# Patient Record
Sex: Male | Born: 1950 | Race: White | Hispanic: No | Smoking: Never smoker
Health system: Southern US, Community
[De-identification: ages and names within clinical notes are randomized; demographics above are authoritative.]

## PROBLEM LIST (undated history)

## (undated) DIAGNOSIS — E785 Hyperlipidemia, unspecified: Secondary | ICD-10-CM

## (undated) DIAGNOSIS — M79605 Pain in left leg: Secondary | ICD-10-CM

## (undated) DIAGNOSIS — G8918 Other acute postprocedural pain: Secondary | ICD-10-CM

## (undated) DIAGNOSIS — I10 Essential (primary) hypertension: Secondary | ICD-10-CM

## (undated) DIAGNOSIS — M549 Dorsalgia, unspecified: Secondary | ICD-10-CM

## (undated) DIAGNOSIS — E119 Type 2 diabetes mellitus without complications: Secondary | ICD-10-CM

## (undated) DIAGNOSIS — M79604 Pain in right leg: Secondary | ICD-10-CM

## (undated) DIAGNOSIS — G8929 Other chronic pain: Secondary | ICD-10-CM

## (undated) DIAGNOSIS — D869 Sarcoidosis, unspecified: Secondary | ICD-10-CM

## (undated) HISTORY — DX: Hyperlipidemia, unspecified: E78.5

## (undated) HISTORY — PX: APPENDECTOMY: SHX54

## (undated) HISTORY — DX: Other acute postprocedural pain: G89.18

## (undated) HISTORY — DX: Pain in left leg: M79.605

## (undated) HISTORY — DX: Other chronic pain: G89.29

## (undated) HISTORY — DX: Pain in right leg: M79.604

## (undated) HISTORY — DX: Dorsalgia, unspecified: M54.9

## (undated) HISTORY — DX: Essential (primary) hypertension: I10

## (undated) HISTORY — PX: OTHER SURGICAL HISTORY: SHX169

## (undated) HISTORY — PX: TONSILLECTOMY: SUR1361

## (undated) HISTORY — DX: Type 2 diabetes mellitus without complications: E11.9

## (undated) HISTORY — DX: Sarcoidosis, unspecified: D86.9

---

## 2010-03-28 DIAGNOSIS — I8500 Esophageal varices without bleeding: Secondary | ICD-10-CM | POA: Insufficient documentation

## 2011-05-30 DIAGNOSIS — G4701 Insomnia due to medical condition: Secondary | ICD-10-CM | POA: Insufficient documentation

## 2011-08-03 DIAGNOSIS — R252 Cramp and spasm: Secondary | ICD-10-CM | POA: Insufficient documentation

## 2011-09-22 DIAGNOSIS — G510 Bell's palsy: Secondary | ICD-10-CM | POA: Insufficient documentation

## 2012-09-12 DIAGNOSIS — M12519 Traumatic arthropathy, unspecified shoulder: Secondary | ICD-10-CM | POA: Insufficient documentation

## 2013-02-19 DIAGNOSIS — R200 Anesthesia of skin: Secondary | ICD-10-CM | POA: Insufficient documentation

## 2013-09-12 DIAGNOSIS — I8390 Asymptomatic varicose veins of unspecified lower extremity: Secondary | ICD-10-CM | POA: Insufficient documentation

## 2013-09-18 DIAGNOSIS — D860 Sarcoidosis of lung: Secondary | ICD-10-CM | POA: Insufficient documentation

## 2013-09-18 DIAGNOSIS — R600 Localized edema: Secondary | ICD-10-CM | POA: Insufficient documentation

## 2014-01-02 DIAGNOSIS — M79605 Pain in left leg: Secondary | ICD-10-CM

## 2014-01-02 HISTORY — DX: Pain in left leg: M79.605

## 2014-01-30 DIAGNOSIS — M7060 Trochanteric bursitis, unspecified hip: Secondary | ICD-10-CM | POA: Insufficient documentation

## 2014-02-14 DIAGNOSIS — R278 Other lack of coordination: Secondary | ICD-10-CM | POA: Insufficient documentation

## 2014-05-02 DIAGNOSIS — F411 Generalized anxiety disorder: Secondary | ICD-10-CM | POA: Insufficient documentation

## 2014-08-26 DIAGNOSIS — E1165 Type 2 diabetes mellitus with hyperglycemia: Secondary | ICD-10-CM | POA: Insufficient documentation

## 2014-12-15 DIAGNOSIS — Z7952 Long term (current) use of systemic steroids: Secondary | ICD-10-CM | POA: Insufficient documentation

## 2015-02-18 DIAGNOSIS — L309 Dermatitis, unspecified: Secondary | ICD-10-CM | POA: Insufficient documentation

## 2015-03-30 DIAGNOSIS — Z79899 Other long term (current) drug therapy: Secondary | ICD-10-CM | POA: Insufficient documentation

## 2015-05-19 DIAGNOSIS — K5900 Constipation, unspecified: Secondary | ICD-10-CM | POA: Insufficient documentation

## 2015-07-15 ENCOUNTER — Encounter: Payer: Self-pay | Admitting: Pain Medicine

## 2015-07-15 ENCOUNTER — Ambulatory Visit: Payer: Medicare Other | Attending: Pain Medicine | Admitting: Pain Medicine

## 2015-07-15 VITALS — BP 137/88 | HR 73 | Temp 97.8°F | Resp 18 | Ht 67.0 in | Wt 180.0 lb

## 2015-07-15 DIAGNOSIS — E663 Overweight: Secondary | ICD-10-CM | POA: Diagnosis not present

## 2015-07-15 DIAGNOSIS — G8929 Other chronic pain: Secondary | ICD-10-CM | POA: Insufficient documentation

## 2015-07-15 DIAGNOSIS — M5136 Other intervertebral disc degeneration, lumbar region: Secondary | ICD-10-CM | POA: Diagnosis not present

## 2015-07-15 DIAGNOSIS — E559 Vitamin D deficiency, unspecified: Secondary | ICD-10-CM | POA: Insufficient documentation

## 2015-07-15 DIAGNOSIS — M549 Dorsalgia, unspecified: Secondary | ICD-10-CM | POA: Diagnosis present

## 2015-07-15 DIAGNOSIS — M545 Low back pain: Secondary | ICD-10-CM | POA: Diagnosis not present

## 2015-07-15 DIAGNOSIS — M25562 Pain in left knee: Secondary | ICD-10-CM

## 2015-07-15 DIAGNOSIS — G894 Chronic pain syndrome: Secondary | ICD-10-CM

## 2015-07-15 DIAGNOSIS — M25561 Pain in right knee: Secondary | ICD-10-CM

## 2015-07-15 DIAGNOSIS — M5441 Lumbago with sciatica, right side: Secondary | ICD-10-CM

## 2015-07-15 DIAGNOSIS — M47816 Spondylosis without myelopathy or radiculopathy, lumbar region: Secondary | ICD-10-CM

## 2015-07-15 DIAGNOSIS — F1120 Opioid dependence, uncomplicated: Secondary | ICD-10-CM

## 2015-07-15 DIAGNOSIS — M4726 Other spondylosis with radiculopathy, lumbar region: Secondary | ICD-10-CM | POA: Diagnosis not present

## 2015-07-15 DIAGNOSIS — Z79899 Other long term (current) drug therapy: Secondary | ICD-10-CM

## 2015-07-15 DIAGNOSIS — M79604 Pain in right leg: Secondary | ICD-10-CM

## 2015-07-15 DIAGNOSIS — F1190 Opioid use, unspecified, uncomplicated: Secondary | ICD-10-CM | POA: Insufficient documentation

## 2015-07-15 DIAGNOSIS — Z79891 Long term (current) use of opiate analgesic: Secondary | ICD-10-CM | POA: Insufficient documentation

## 2015-07-15 DIAGNOSIS — M5416 Radiculopathy, lumbar region: Secondary | ICD-10-CM | POA: Insufficient documentation

## 2015-07-15 HISTORY — DX: Pain in right leg: M79.604

## 2015-07-15 MED ORDER — OXYCODONE-ACETAMINOPHEN 10-325 MG PO TABS
1.0000 | ORAL_TABLET | Freq: Two times a day (BID) | ORAL | Status: DC | PRN
Start: 2015-07-15 — End: 2015-10-07

## 2015-07-15 MED ORDER — MORPHINE SULFATE ER 30 MG PO TBCR: 30.0000 mg | EXTENDED_RELEASE_TABLET | Freq: Two times a day (BID) | ORAL | Status: DC

## 2015-07-15 MED ORDER — OXYCODONE-ACETAMINOPHEN 10-325 MG PO TABS: 1.0000 | ORAL_TABLET | Freq: Two times a day (BID) | ORAL | Status: DC | PRN

## 2015-07-15 MED ORDER — GABAPENTIN 300 MG PO CAPS: 600.0000 mg | ORAL_CAPSULE | Freq: Three times a day (TID) | ORAL | Status: DC

## 2015-07-15 MED ORDER — OXYCODONE-ACETAMINOPHEN 10-325 MG PO TABS
1.0000 | ORAL_TABLET | Freq: Two times a day (BID) | ORAL | Status: DC | PRN
Start: 2015-07-15 — End: 2015-11-19

## 2015-07-15 NOTE — Progress Notes (Signed)
Safety precautions to be maintained throughout the outpatient stay will include: orient to surroundings, keep bed in low position, maintain call bell within reach at all times, provide assistance with transfer out of bed and ambulation. 6 morphine 30 mg pills remain  7/60 Percocet remain

## 2015-07-15 NOTE — Patient Instructions (Signed)
GENERAL RISKS AND COMPLICATIONS  What are the risk, side effects and possible complications? Generally speaking, most procedures are safe.  However, with any procedure there are risks, side effects, and the possibility of complications.  The risks and complications are dependent upon the sites that are lesioned, or the type of nerve block to be performed.  The closer the procedure is to the spine, the more serious the risks are.  Great care is taken when placing the radio frequency needles, block needles or lesioning probes, but sometimes complications can occur. 1. Infection: Any time there is an injection through the skin, there is a risk of infection.  This is why sterile conditions are used for these blocks.  There are four possible types of infection. 1. Localized skin infection. 2. Central Nervous System Infection-This can be in the form of Meningitis, which can be deadly. 3. Epidural Infections-This can be in the form of an epidural abscess, which can cause pressure inside of the spine, causing compression of the spinal cord with subsequent paralysis. This would require an emergency surgery to decompress, and there are no guarantees that the patient would recover from the paralysis. 4. Discitis-This is an infection of the intervertebral discs.  It occurs in about 1% of discography procedures.  It is difficult to treat and it may lead to surgery.        2. Pain: the needles have to go through skin and soft tissues, will cause soreness.       3. Damage to internal structures:  The nerves to be lesioned may be near blood vessels or    other nerves which can be potentially damaged.       4. Bleeding: Bleeding is more common if the patient is taking blood thinners such as  aspirin, Coumadin, Ticiid, Plavix, etc., or if he/she have some genetic predisposition  such as hemophilia. Bleeding into the spinal canal can cause compression of the spinal  cord with subsequent paralysis.  This would require an  emergency surgery to  decompress and there are no guarantees that the patient would recover from the  paralysis.       5. Pneumothorax:  Puncturing of a lung is a possibility, every time a needle is introduced in  the area of the chest or upper back.  Pneumothorax refers to free air around the  collapsed lung(s), inside of the thoracic cavity (chest cavity).  Another two possible  complications related to a similar event would include: Hemothorax and Chylothorax.   These are variations of the Pneumothorax, where instead of air around the collapsed  lung(s), you may have blood or chyle, respectively.       6. Spinal headaches: They may occur with any procedures in the area of the spine.       7. Persistent CSF (Cerebro-Spinal Fluid) leakage: This is a rare problem, but may occur  with prolonged intrathecal or epidural catheters either due to the formation of a fistulous  track or a dural tear.       8. Nerve damage: By working so close to the spinal cord, there is always a possibility of  nerve damage, which could be as serious as a permanent spinal cord injury with  paralysis.       9. Death:  Although rare, severe deadly allergic reactions known as "Anaphylactic  reaction" can occur to any of the medications used.      10. Worsening of the symptoms:  We can always make thing worse.    What are the chances of something like this happening? Chances of any of this occuring are extremely low.  By statistics, you have more of a chance of getting killed in a motor vehicle accident: while driving to the hospital than any of the above occurring .  Nevertheless, you should be aware that they are possibilities.  In general, it is similar to taking a shower.  Everybody knows that you can slip, hit your head and get killed.  Does that mean that you should not shower again?  Nevertheless always keep in mind that statistics do not mean anything if you happen to be on the wrong side of them.  Even if a procedure has a 1  (one) in a 1,000,000 (million) chance of going wrong, it you happen to be that one..Also, keep in mind that by statistics, you have more of a chance of having something go wrong when taking medications.  Who should not have this procedure? If you are on a blood thinning medication (e.g. Coumadin, Plavix, see list of "Blood Thinners"), or if you have an active infection going on, you should not have the procedure.  If you are taking any blood thinners, please inform your physician.  How should I prepare for this procedure?  Do not eat or drink anything at least six hours prior to the procedure.  Bring a driver with you .  It cannot be a taxi.  Come accompanied by an adult that can drive you back, and that is strong enough to help you if your legs get weak or numb from the local anesthetic.  Take all of your medicines the morning of the procedure with just enough water to swallow them.  If you have diabetes, make sure that you are scheduled to have your procedure done first thing in the morning, whenever possible.  If you have diabetes, take only half of your insulin dose and notify our nurse that you have done so as soon as you arrive at the clinic.  If you are diabetic, but only take blood sugar pills (oral hypoglycemic), then do not take them on the morning of your procedure.  You may take them after you have had the procedure.  Do not take aspirin or any aspirin-containing medications, at least eleven (11) days prior to the procedure.  They may prolong bleeding.  Wear loose fitting clothing that may be easy to take off and that you would not mind if it got stained with Betadine or blood.  Do not wear any jewelry or perfume  Remove any nail coloring.  It will interfere with some of our monitoring equipment.  NOTE: Remember that this is not meant to be interpreted as a complete list of all possible complications.  Unforeseen problems may occur.  BLOOD THINNERS The following drugs  contain aspirin or other products, which can cause increased bleeding during surgery and should not be taken for 2 weeks prior to and 1 week after surgery.  If you should need take something for relief of minor pain, you may take acetaminophen which is found in Tylenol,m Datril, Anacin-3 and Panadol. It is not blood thinner. The products listed below are.  Do not take any of the products listed below in addition to any listed on your instruction sheet.  A.P.C or A.P.C with Codeine Codeine Phosphate Capsules #3 Ibuprofen Ridaura  ABC compound Congesprin Imuran rimadil  Advil Cope Indocin Robaxisal  Alka-Seltzer Effervescent Pain Reliever and Antacid Coricidin or Coricidin-D  Indomethacin Rufen    Alka-Seltzer plus Cold Medicine Cosprin Ketoprofen S-A-C Tablets  Anacin Analgesic Tablets or Capsules Coumadin Korlgesic Salflex  Anacin Extra Strength Analgesic tablets or capsules CP-2 Tablets Lanoril Salicylate  Anaprox Cuprimine Capsules Levenox Salocol  Anexsia-D Dalteparin Magan Salsalate  Anodynos Darvon compound Magnesium Salicylate Sine-off  Ansaid Dasin Capsules Magsal Sodium Salicylate  Anturane Depen Capsules Marnal Soma  APF Arthritis pain formula Dewitt's Pills Measurin Stanback  Argesic Dia-Gesic Meclofenamic Sulfinpyrazone  Arthritis Bayer Timed Release Aspirin Diclofenac Meclomen Sulindac  Arthritis pain formula Anacin Dicumarol Medipren Supac  Analgesic (Safety coated) Arthralgen Diffunasal Mefanamic Suprofen  Arthritis Strength Bufferin Dihydrocodeine Mepro Compound Suprol  Arthropan liquid Dopirydamole Methcarbomol with Aspirin Synalgos  ASA tablets/Enseals Disalcid Micrainin Tagament  Ascriptin Doan's Midol Talwin  Ascriptin A/D Dolene Mobidin Tanderil  Ascriptin Extra Strength Dolobid Moblgesic Ticlid  Ascriptin with Codeine Doloprin or Doloprin with Codeine Momentum Tolectin  Asperbuf Duoprin Mono-gesic Trendar  Aspergum Duradyne Motrin or Motrin IB Triminicin  Aspirin  plain, buffered or enteric coated Durasal Myochrisine Trigesic  Aspirin Suppositories Easprin Nalfon Trillsate  Aspirin with Codeine Ecotrin Regular or Extra Strength Naprosyn Uracel  Atromid-S Efficin Naproxen Ursinus  Auranofin Capsules Elmiron Neocylate Vanquish  Axotal Emagrin Norgesic Verin  Azathioprine Empirin or Empirin with Codeine Normiflo Vitamin E  Azolid Emprazil Nuprin Voltaren  Bayer Aspirin plain, buffered or children's or timed BC Tablets or powders Encaprin Orgaran Warfarin Sodium  Buff-a-Comp Enoxaparin Orudis Zorpin  Buff-a-Comp with Codeine Equegesic Os-Cal-Gesic   Buffaprin Excedrin plain, buffered or Extra Strength Oxalid   Bufferin Arthritis Strength Feldene Oxphenbutazone   Bufferin plain or Extra Strength Feldene Capsules Oxycodone with Aspirin   Bufferin with Codeine Fenoprofen Fenoprofen Pabalate or Pabalate-SF   Buffets II Flogesic Panagesic   Buffinol plain or Extra Strength Florinal or Florinal with Codeine Panwarfarin   Buf-Tabs Flurbiprofen Penicillamine   Butalbital Compound Four-way cold tablets Penicillin   Butazolidin Fragmin Pepto-Bismol   Carbenicillin Geminisyn Percodan   Carna Arthritis Reliever Geopen Persantine   Carprofen Gold's salt Persistin   Chloramphenicol Goody's Phenylbutazone   Chloromycetin Haltrain Piroxlcam   Clmetidine heparin Plaquenil   Cllnoril Hyco-pap Ponstel   Clofibrate Hydroxy chloroquine Propoxyphen         Before stopping any of these medications, be sure to consult the physician who ordered them.  Some, such as Coumadin (Warfarin) are ordered to prevent or treat serious conditions such as "deep thrombosis", "pumonary embolisms", and other heart problems.  The amount of time that you may need off of the medication may also vary with the medication and the reason for which you were taking it.  If you are taking any of these medications, please make sure you notify your pain physician before you undergo any  procedures.         Epidural Steroid Injection Patient Information  Description: The epidural space surrounds the nerves as they exit the spinal cord.  In some patients, the nerves can be compressed and inflamed by a bulging disc or a tight spinal canal (spinal stenosis).  By injecting steroids into the epidural space, we can bring irritated nerves into direct contact with a potentially helpful medication.  These steroids act directly on the irritated nerves and can reduce swelling and inflammation which often leads to decreased pain.  Epidural steroids may be injected anywhere along the spine and from the neck to the low back depending upon the location of your pain.   After numbing the skin with local anesthetic (like Novocaine), a small needle is passed   into the epidural space slowly.  You may experience a sensation of pressure while this is being done.  The entire block usually last less than 10 minutes.  Conditions which may be treated by epidural steroids:   Low back and leg pain  Neck and arm pain  Spinal stenosis  Post-laminectomy syndrome  Herpes zoster (shingles) pain  Pain from compression fractures  Preparation for the injection:  1. Do not eat any solid food or dairy products within 6 hours of your appointment.  2. You may drink clear liquids up to 2 hours before appointment.  Clear liquids include water, black coffee, juice or soda.  No milk or cream please. 3. You may take your regular medication, including pain medications, with a sip of water before your appointment  Diabetics should hold regular insulin (if taken separately) and take 1/2 normal NPH dos the morning of the procedure.  Carry some sugar containing items with you to your appointment. 4. A driver must accompany you and be prepared to drive you home after your procedure.  5. Bring all your current medications with your. 6. An IV may be inserted and sedation may be given at the discretion of the  physician.   7. A blood pressure cuff, EKG and other monitors will often be applied during the procedure.  Some patients may need to have extra oxygen administered for a short period. 8. You will be asked to provide medical information, including your allergies, prior to the procedure.  We must know immediately if you are taking blood thinners (like Coumadin/Warfarin)  Or if you are allergic to IV iodine contrast (dye). We must know if you could possible be pregnant.  Possible side-effects:  Bleeding from needle site  Infection (rare, may require surgery)  Nerve injury (rare)  Numbness & tingling (temporary)  Difficulty urinating (rare, temporary)  Spinal headache ( a headache worse with upright posture)  Light -headedness (temporary)  Pain at injection site (several days)  Decreased blood pressure (temporary)  Weakness in arm/leg (temporary)  Pressure sensation in back/neck (temporary)  Call if you experience:  Fever/chills associated with headache or increased back/neck pain.  Headache worsened by an upright position.  New onset weakness or numbness of an extremity below the injection site  Hives or difficulty breathing (go to the emergency room)  Inflammation or drainage at the infection site  Severe back/neck pain  Any new symptoms which are concerning to you  Please note:  Although the local anesthetic injected can often make your back or neck feel good for several hours after the injection, the pain will likely return.  It takes 3-7 days for steroids to work in the epidural space.  You may not notice any pain relief for at least that one week.  If effective, we will often do a series of three injections spaced 3-6 weeks apart to maximally decrease your pain.  After the initial series, we generally will wait several months before considering a repeat injection of the same type.  If you have any questions, please call [phone] [facility] 

## 2015-07-19 NOTE — Progress Notes (Signed)
Patient's Name: Douglas Andrews MRN: [id number] DOB: Jun 20, 1951 DOS: 07/15/2015  Primary Reason(s) for Visit: Encounter for Medication Management. CC: Back Pain   HPI:   Douglas Andrews is a 64 y.o. year old, male patient, who returns today as an established patient. He has Chronic low back pain; Chronic pain syndrome; DDD (degenerative disc disease), lumbar; Osteoarthritis of spine with radiculopathy, lumbar region; Lumbar radicular pain; Right leg pain; Facet syndrome, lumbar; Opiate use; Uncomplicated opioid dependence (HCC); Long term prescription opiate use; Bilateral chronic knee pain; Vitamin D insufficiency; Overweight; Anxiety state; Bell palsy; Back pain, chronic; CN (constipation); Dermatitis, eczematoid; Diabetes mellitus (HCC); Dysmetria; Essential (primary) hypertension; Anxiety, generalized; Insomnia due to medical condition; Left leg pain; Long term current use of systemic steroids; Edema leg; Type 2 diabetes mellitus (HCC); Pulmonary sarcoidosis (HCC); Spasm; Arthropathy, traumatic, shoulder; Bursitis, trochanteric; Esophageal varices (HCC); and Leg varices on his problem list.. His primarily concern today is the Back Pain      Pharmacotherapy Review: Side-effects or Adverse reactions: None reported. Effectiveness: Described as relatively effective, allowing for increase in activities of daily living (ADL). Onset of action: Within expected pharmacological parameters. Duration of action: Within normal limits for medication. Peak effect: Timing and results are as within normal expected parameters. [place] PMP: Compliant with practice rules and regulations. DST: Compliant with practice rules and regulations. Lab work: No new labs ordered by our practice. Treatment compliance: Compliant. Substance Use Disorder (SUD) Risk Level: Low Planned course of action: Continue therapy as is.  Allergies: Douglas Andrews has No Known Allergies.  Meds: The patient has a current medication list which includes  the following prescription(s): aspirin ec, atorvastatin, cetirizine, gabapentin, lisinopril, metformin, morphine, morphine, morphine, nitroglycerin, omeprazole, oxycodone-acetaminophen, oxycodone-acetaminophen, oxycodone-acetaminophen, prednisone, and sertraline. Requested Prescriptions   Signed Prescriptions Disp Refills  . gabapentin (NEURONTIN) 300 MG capsule 180 capsule 2    Sig: Take 2 capsules (600 mg total) by mouth 3 (three) times daily.  Marland Kitchen morphine (MS CONTIN) 30 MG 12 hr tablet 60 tablet 0    Sig: Take 1 tablet (30 mg total) by mouth every 12 (twelve) hours.  Marland Kitchen morphine (MS CONTIN) 30 MG 12 hr tablet 60 tablet 0    Sig: Take 1 tablet (30 mg total) by mouth every 12 (twelve) hours.  Marland Kitchen morphine (MS CONTIN) 30 MG 12 hr tablet 60 tablet 0    Sig: Take 1 tablet (30 mg total) by mouth every 12 (twelve) hours.  Marland Kitchen oxyCODONE-acetaminophen (PERCOCET) 10-325 MG tablet 60 tablet 0    Sig: Take 1 tablet by mouth every 12 (twelve) hours as needed for pain.  Marland Kitchen oxyCODONE-acetaminophen (PERCOCET) 10-325 MG tablet 60 tablet 0    Sig: Take 1 tablet by mouth every 12 (twelve) hours as needed for pain.  Marland Kitchen oxyCODONE-acetaminophen (PERCOCET) 10-325 MG tablet 60 tablet 0    Sig: Take 1 tablet by mouth every 12 (twelve) hours as needed for pain.    ROS: Constitutional: Afebrile, no chills, well hydrated and well nourished Gastrointestinal: negative Musculoskeletal:negative Neurological: negative Behavioral/Psych: negative  PFSH: Medical:  Douglas Andrews  has a past medical history of Diabetes mellitus without complication (HCC); Hyperlipidemia; Hypertension; Chronic back pain; and Sarcoidosis (HCC). Family: family history includes Diabetes in his father; Heart disease in his father and mother; Stroke in his father. Surgical:  has past surgical history that includes Appendectomy; Tonsillectomy; and left rod. Tobacco:  does not have a smoking history on file. He has never used smokeless tobacco. Alcohol:   reports that he  does not drink alcohol. Drug:  reports that he does not use illicit drugs.  Physical Exam: Vitals:  Today's Vitals   07/15/15 1332 07/15/15 1337  BP: 137/88   Pulse: 73   Temp: 97.8 F (36.6 C)   TempSrc: Oral   Resp: 18   Height: 5\' 7"  (1.702 m)   Weight: 180 lb (81.647 kg)   SpO2: 100%   PainSc:  6   Calculated BMI: Body mass index is 28.19 kg/(m^2). General appearance: alert, cooperative, appears stated age and mild distress Eyes: conjunctivae/corneas clear. PERRL, EOM's intact. Fundi benign. Lungs: No evidence respiratory distress, no audible rales or ronchi and no use of accessory muscles of respiration Neck: no adenopathy, no carotid bruit, no JVD, supple, symmetrical, trachea midline and thyroid not enlarged, symmetric, no tenderness/mass/nodules Back: symmetric, no curvature. ROM normal. No CVA tenderness. Extremities: extremities normal, atraumatic, no cyanosis or edema Pulses: 2+ and symmetric Skin: Skin color, texture, turgor normal. No rashes or lesions Neurologic: Grossly normal    Assessment: Encounter Diagnosis:  Primary Diagnosis: Chronic low back pain [M54.5, G89.29]  Plan: Douglas Andrews was seen today for back pain.  Diagnoses and all orders for this visit:  Chronic low back pain  Chronic pain syndrome -     morphine (MS CONTIN) 30 MG 12 hr tablet; Take 1 tablet (30 mg total) by mouth every 12 (twelve) hours. -     morphine (MS CONTIN) 30 MG 12 hr tablet; Take 1 tablet (30 mg total) by mouth every 12 (twelve) hours. -     morphine (MS CONTIN) 30 MG 12 hr tablet; Take 1 tablet (30 mg total) by mouth every 12 (twelve) hours. -     oxyCODONE-acetaminophen (PERCOCET) 10-325 MG tablet; Take 1 tablet by mouth every 12 (twelve) hours as needed for pain. -     oxyCODONE-acetaminophen (PERCOCET) 10-325 MG tablet; Take 1 tablet by mouth every 12 (twelve) hours as needed for pain. -     oxyCODONE-acetaminophen (PERCOCET) 10-325 MG tablet; Take 1 tablet by  mouth every 12 (twelve) hours as needed for pain.  DDD (degenerative disc disease), lumbar  Osteoarthritis of spine with radiculopathy, lumbar region -     LUMBAR EPIDURAL STEROID INJECTION; Future  Lumbar radicular pain  Right leg pain  Facet syndrome, lumbar  Opiate use  Uncomplicated opioid dependence (HCC)  Long term prescription opiate use -     Drugs of abuse screen w/o alc, rtn urine-sln; Future  Bilateral chronic knee pain  Vitamin D insufficiency  Overweight  Other orders -     gabapentin (NEURONTIN) 300 MG capsule; Take 2 capsules (600 mg total) by mouth 3 (three) times daily. -     Discontinue: morphine (MS CONTIN) 30 MG 12 hr tablet; Take 1 tablet (30 mg total) by mouth every 12 (twelve) hours.     Patient Instructions   GENERAL RISKS AND COMPLICATIONS  What are the risk, side effects and possible complications? Generally speaking, most procedures are safe.  However, with any procedure there are risks, side effects, and the possibility of complications.  The risks and complications are dependent upon the sites that are lesioned, or the type of nerve block to be performed.  The closer the procedure is to the spine, the more serious the risks are.  Great care is taken when placing the radio frequency needles, block needles or lesioning probes, but sometimes complications can occur. 1. Infection: Any time there is an injection through the skin, there is a risk of  infection.  This is why sterile conditions are used for these blocks.  There are four possible types of infection. 1. Localized skin infection. 2. Central Nervous System Infection-This can be in the form of Meningitis, which can be deadly. 3. Epidural Infections-This can be in the form of an epidural abscess, which can cause pressure inside of the spine, causing compression of the spinal cord with subsequent paralysis. This would require an emergency surgery to decompress, and there are no guarantees that  the patient would recover from the paralysis. 4. Discitis-This is an infection of the intervertebral discs.  It occurs in about 1% of discography procedures.  It is difficult to treat and it may lead to surgery.        2. Pain: the needles have to go through skin and soft tissues, will cause soreness.       3. Damage to internal structures:  The nerves to be lesioned may be near blood vessels or    other nerves which can be potentially damaged.       4. Bleeding: Bleeding is more common if the patient is taking blood thinners such as  aspirin, Coumadin, Ticiid, Plavix, etc., or if he/she have some genetic predisposition  such as hemophilia. Bleeding into the spinal canal can cause compression of the spinal  cord with subsequent paralysis.  This would require an emergency surgery to  decompress and there are no guarantees that the patient would recover from the  paralysis.       5. Pneumothorax:  Puncturing of a lung is a possibility, every time a needle is introduced in  the area of the chest or upper back.  Pneumothorax refers to free air around the  collapsed lung(s), inside of the thoracic cavity (chest cavity).  Another two possible  complications related to a similar event would include: Hemothorax and Chylothorax.   These are variations of the Pneumothorax, where instead of air around the collapsed  lung(s), you may have blood or chyle, respectively.       6. Spinal headaches: They may occur with any procedures in the area of the spine.       7. Persistent CSF (Cerebro-Spinal Fluid) leakage: This is a rare problem, but may occur  with prolonged intrathecal or epidural catheters either due to the formation of a fistulous  track or a dural tear.       8. Nerve damage: By working so close to the spinal cord, there is always a possibility of  nerve damage, which could be as serious as a permanent spinal cord injury with  paralysis.       9. Death:  Although rare, severe deadly allergic reactions known  as "Anaphylactic  reaction" can occur to any of the medications used.      10. Worsening of the symptoms:  We can always make thing worse.  What are the chances of something like this happening? Chances of any of this occuring are extremely low.  By statistics, you have more of a chance of getting killed in a motor vehicle accident: while driving to the hospital than any of the above occurring .  Nevertheless, you should be aware that they are possibilities.  In general, it is similar to taking a shower.  Everybody knows that you can slip, hit your head and get killed.  Does that mean that you should not shower again?  Nevertheless always keep in mind that statistics do not mean anything if you happen to be  on the wrong side of them.  Even if a procedure has a 1 (one) in a 1,000,000 (million) chance of going wrong, it you happen to be that one..Also, keep in mind that by statistics, you have more of a chance of having something go wrong when taking medications.  Who should not have this procedure? If you are on a blood thinning medication (e.g. Coumadin, Plavix, see list of "Blood Thinners"), or if you have an active infection going on, you should not have the procedure.  If you are taking any blood thinners, please inform your physician.  How should I prepare for this procedure?  Do not eat or drink anything at least six hours prior to the procedure.  Bring a driver with you .  It cannot be a taxi.  Come accompanied by an adult that can drive you back, and that is strong enough to help you if your legs get weak or numb from the local anesthetic.  Take all of your medicines the morning of the procedure with just enough water to swallow them.  If you have diabetes, make sure that you are scheduled to have your procedure done first thing in the morning, whenever possible.  If you have diabetes, take only half of your insulin dose and notify our nurse that you have done so as soon as you arrive at  the clinic.  If you are diabetic, but only take blood sugar pills (oral hypoglycemic), then do not take them on the morning of your procedure.  You may take them after you have had the procedure.  Do not take aspirin or any aspirin-containing medications, at least eleven (11) days prior to the procedure.  They may prolong bleeding.  Wear loose fitting clothing that may be easy to take off and that you would not mind if it got stained with Betadine or blood.  Do not wear any jewelry or perfume  Remove any nail coloring.  It will interfere with some of our monitoring equipment.  NOTE: Remember that this is not meant to be interpreted as a complete list of all possible complications.  Unforeseen problems may occur.  BLOOD THINNERS The following drugs contain aspirin or other products, which can cause increased bleeding during surgery and should not be taken for 2 weeks prior to and 1 week after surgery.  If you should need take something for relief of minor pain, you may take acetaminophen which is found in Tylenol,m Datril, Anacin-3 and Panadol. It is not blood thinner. The products listed below are.  Do not take any of the products listed below in addition to any listed on your instruction sheet.  A.P.C or A.P.C with Codeine Codeine Phosphate Capsules #3 Ibuprofen Ridaura  ABC compound Congesprin Imuran rimadil  Advil Cope Indocin Robaxisal  Alka-Seltzer Effervescent Pain Reliever and Antacid Coricidin or Coricidin-D  Indomethacin Rufen  Alka-Seltzer plus Cold Medicine Cosprin Ketoprofen S-A-C Tablets  Anacin Analgesic Tablets or Capsules Coumadin Korlgesic Salflex  Anacin Extra Strength Analgesic tablets or capsules CP-2 Tablets Lanoril Salicylate  Anaprox Cuprimine Capsules Levenox Salocol  Anexsia-D Dalteparin Magan Salsalate  Anodynos Darvon compound Magnesium Salicylate Sine-off  Ansaid Dasin Capsules Magsal Sodium Salicylate  Anturane Depen Capsules Marnal Soma  APF Arthritis pain  formula Dewitt's Pills Measurin Stanback  Argesic Dia-Gesic Meclofenamic Sulfinpyrazone  Arthritis Bayer Timed Release Aspirin Diclofenac Meclomen Sulindac  Arthritis pain formula Anacin Dicumarol Medipren Supac  Analgesic (Safety coated) Arthralgen Diffunasal Mefanamic Suprofen  Arthritis Strength Bufferin Dihydrocodeine Mepro Compound Suprol  Arthropan liquid Dopirydamole Methcarbomol with Aspirin Synalgos  ASA tablets/Enseals Disalcid Micrainin Tagament  Ascriptin Doan's Midol Talwin  Ascriptin A/D Dolene Mobidin Tanderil  Ascriptin Extra Strength Dolobid Moblgesic Ticlid  Ascriptin with Codeine Doloprin or Doloprin with Codeine Momentum Tolectin  Asperbuf Duoprin Mono-gesic Trendar  Aspergum Duradyne Motrin or Motrin IB Triminicin  Aspirin plain, buffered or enteric coated Durasal Myochrisine Trigesic  Aspirin Suppositories Easprin Nalfon Trillsate  Aspirin with Codeine Ecotrin Regular or Extra Strength Naprosyn Uracel  Atromid-S Efficin Naproxen Ursinus  Auranofin Capsules Elmiron Neocylate Vanquish  Axotal Emagrin Norgesic Verin  Azathioprine Empirin or Empirin with Codeine Normiflo Vitamin E  Azolid Emprazil Nuprin Voltaren  Bayer Aspirin plain, buffered or children's or timed BC Tablets or powders Encaprin Orgaran Warfarin Sodium  Buff-a-Comp Enoxaparin Orudis Zorpin  Buff-a-Comp with Codeine Equegesic Os-Cal-Gesic   Buffaprin Excedrin plain, buffered or Extra Strength Oxalid   Bufferin Arthritis Strength Feldene Oxphenbutazone   Bufferin plain or Extra Strength Feldene Capsules Oxycodone with Aspirin   Bufferin with Codeine Fenoprofen Fenoprofen Pabalate or Pabalate-SF   Buffets II Flogesic Panagesic   Buffinol plain or Extra Strength Florinal or Florinal with Codeine Panwarfarin   Buf-Tabs Flurbiprofen Penicillamine   Butalbital Compound Four-way cold tablets Penicillin   Butazolidin Fragmin Pepto-Bismol   Carbenicillin Geminisyn Percodan   Carna Arthritis Reliever Geopen  Persantine   Carprofen Gold's salt Persistin   Chloramphenicol Goody's Phenylbutazone   Chloromycetin Haltrain Piroxlcam   Clmetidine heparin Plaquenil   Cllnoril Hyco-pap Ponstel   Clofibrate Hydroxy chloroquine Propoxyphen         Before stopping any of these medications, be sure to consult the physician who ordered them.  Some, such as Coumadin (Warfarin) are ordered to prevent or treat serious conditions such as "deep thrombosis", "pumonary embolisms", and other heart problems.  The amount of time that you may need off of the medication may also vary with the medication and the reason for which you were taking it.  If you are taking any of these medications, please make sure you notify your pain physician before you undergo any procedures.         Epidural Steroid Injection Patient Information  Description: The epidural space surrounds the nerves as they exit the spinal cord.  In some patients, the nerves can be compressed and inflamed by a bulging disc or a tight spinal canal (spinal stenosis).  By injecting steroids into the epidural space, we can bring irritated nerves into direct contact with a potentially helpful medication.  These steroids act directly on the irritated nerves and can reduce swelling and inflammation which often leads to decreased pain.  Epidural steroids may be injected anywhere along the spine and from the neck to the low back depending upon the location of your pain.   After numbing the skin with local anesthetic (like Novocaine), a small needle is passed into the epidural space slowly.  You may experience a sensation of pressure while this is being done.  The entire block usually last less than 10 minutes.  Conditions which may be treated by epidural steroids:   Low back and leg pain  Neck and arm pain  Spinal stenosis  Post-laminectomy syndrome  Herpes zoster (shingles) pain  Pain from compression fractures  Preparation for the injection:  1. Do  not eat any solid food or dairy products within 6 hours of your appointment.  2. You may drink clear liquids up to 2 hours before appointment.  Clear liquids include water, black coffee, juice  or soda.  No milk or cream please. 3. You may take your regular medication, including pain medications, with a sip of water before your appointment  Diabetics should hold regular insulin (if taken separately) and take 1/2 normal NPH dos the morning of the procedure.  Carry some sugar containing items with you to your appointment. 4. A driver must accompany you and be prepared to drive you home after your procedure.  5. Bring all your current medications with your. 6. An IV may be inserted and sedation may be given at the discretion of the physician.   7. A blood pressure cuff, EKG and other monitors will often be applied during the procedure.  Some patients may need to have extra oxygen administered for a short period. 8. You will be asked to provide medical information, including your allergies, prior to the procedure.  We must know immediately if you are taking blood thinners (like Coumadin/Warfarin)  Or if you are allergic to IV iodine contrast (dye). We must know if you could possible be pregnant.  Possible side-effects:  Bleeding from needle site  Infection (rare, may require surgery)  Nerve injury (rare)  Numbness & tingling (temporary)  Difficulty urinating (rare, temporary)  Spinal headache ( a headache worse with upright posture)  Light -headedness (temporary)  Pain at injection site (several days)  Decreased blood pressure (temporary)  Weakness in arm/leg (temporary)  Pressure sensation in back/neck (temporary)  Call if you experience:  Fever/chills associated with headache or increased back/neck pain.  Headache worsened by an upright position.  New onset weakness or numbness of an extremity below the injection site  Hives or difficulty breathing (go to the emergency  room)  Inflammation or drainage at the infection site  Severe back/neck pain  Any new symptoms which are concerning to you  Please note:  Although the local anesthetic injected can often make your back or neck feel good for several hours after the injection, the pain will likely return.  It takes 3-7 days for steroids to work in the epidural space.  You may not notice any pain relief for at least that one week.  If effective, we will often do a series of three injections spaced 3-6 weeks apart to maximally decrease your pain.  After the initial series, we generally will wait several months before considering a repeat injection of the same type.  If you have any questions, please call [phone] [facility]   Medications discontinued today:  Medications Discontinued During This Encounter  Medication Reason  . gabapentin (NEURONTIN) 300 MG capsule   . gabapentin (NEURONTIN) 300 MG capsule   . morphine (MS CONTIN) 30 MG 12 hr tablet Duplicate  . oxyCODONE-acetaminophen (PERCOCET) 10-325 MG tablet Duplicate   Medications administered today:  Mr. Kronk had no medications administered during this visit.  Primary Care Physician: No primary care provider on file. Location: [facility] Note by: Lowana Hable A. Laban Emperor, M.D, DABA, DABAPM, DABPM, DABIPP, FIPP

## 2015-07-23 ENCOUNTER — Ambulatory Visit: Payer: Medicare Other | Attending: Pain Medicine | Admitting: Pain Medicine

## 2015-07-23 ENCOUNTER — Encounter: Payer: Self-pay | Admitting: Pain Medicine

## 2015-07-23 VITALS — BP 168/108 | HR 76 | Temp 97.8°F | Resp 26 | Ht 67.0 in | Wt 179.0 lb

## 2015-07-23 DIAGNOSIS — M5136 Other intervertebral disc degeneration, lumbar region: Secondary | ICD-10-CM | POA: Insufficient documentation

## 2015-07-23 DIAGNOSIS — M4726 Other spondylosis with radiculopathy, lumbar region: Secondary | ICD-10-CM

## 2015-07-23 DIAGNOSIS — E119 Type 2 diabetes mellitus without complications: Secondary | ICD-10-CM | POA: Diagnosis not present

## 2015-07-23 DIAGNOSIS — I10 Essential (primary) hypertension: Secondary | ICD-10-CM | POA: Diagnosis not present

## 2015-07-23 DIAGNOSIS — F1190 Opioid use, unspecified, uncomplicated: Secondary | ICD-10-CM | POA: Diagnosis not present

## 2015-07-23 DIAGNOSIS — F411 Generalized anxiety disorder: Secondary | ICD-10-CM | POA: Diagnosis not present

## 2015-07-23 DIAGNOSIS — D860 Sarcoidosis of lung: Secondary | ICD-10-CM | POA: Diagnosis not present

## 2015-07-23 DIAGNOSIS — M545 Low back pain, unspecified: Secondary | ICD-10-CM

## 2015-07-23 DIAGNOSIS — M47816 Spondylosis without myelopathy or radiculopathy, lumbar region: Secondary | ICD-10-CM | POA: Insufficient documentation

## 2015-07-23 DIAGNOSIS — G8929 Other chronic pain: Secondary | ICD-10-CM | POA: Diagnosis not present

## 2015-07-23 DIAGNOSIS — M549 Dorsalgia, unspecified: Secondary | ICD-10-CM | POA: Diagnosis present

## 2015-07-23 DIAGNOSIS — E663 Overweight: Secondary | ICD-10-CM | POA: Insufficient documentation

## 2015-07-23 DIAGNOSIS — M5416 Radiculopathy, lumbar region: Secondary | ICD-10-CM | POA: Diagnosis not present

## 2015-07-23 MED ORDER — TRIAMCINOLONE ACETONIDE 40 MG/ML IJ SUSP: 40.0000 mg | Freq: Once | INTRAMUSCULAR | Status: DC

## 2015-07-23 MED ORDER — ROPIVACAINE HCL 2 MG/ML IJ SOLN: 2.0000 mL | Freq: Once | INTRAMUSCULAR | Status: DC

## 2015-07-23 MED ORDER — LIDOCAINE HCL (PF) 1 % IJ SOLN: 10.0000 mL | Freq: Once | INTRAMUSCULAR | Status: DC

## 2015-07-23 MED ORDER — SODIUM CHLORIDE 0.9 % IJ SOLN: 2.0000 mL | Freq: Once | INTRAMUSCULAR | Status: DC

## 2015-07-23 MED ORDER — ROPIVACAINE HCL 2 MG/ML IJ SOLN
INTRAMUSCULAR | Status: AC
  Administered 2015-07-23: 09:00:00
  Filled 2015-07-23: qty 10

## 2015-07-23 MED ORDER — LIDOCAINE HCL (PF) 1 % IJ SOLN
INTRAMUSCULAR | Status: AC
  Administered 2015-07-23: 09:00:00
  Filled 2015-07-23: qty 5

## 2015-07-23 MED ORDER — IOHEXOL 180 MG/ML  SOLN: 5.0000 mL | Freq: Once | INTRAMUSCULAR | Status: DC | PRN

## 2015-07-23 MED ORDER — TRIAMCINOLONE ACETONIDE 40 MG/ML IJ SUSP
INTRAMUSCULAR | Status: AC
  Administered 2015-07-23: 09:00:00
  Filled 2015-07-23: qty 1

## 2015-07-23 MED ORDER — SODIUM CHLORIDE 0.9 % IJ SOLN
INTRAMUSCULAR | Status: AC
  Administered 2015-07-23: 09:00:00
  Filled 2015-07-23: qty 10

## 2015-07-23 MED ORDER — IOHEXOL 180 MG/ML  SOLN
INTRAMUSCULAR | Status: AC
  Administered 2015-07-23: 2.5 mL via INTRATHECAL
  Filled 2015-07-23: qty 20

## 2015-07-23 NOTE — Progress Notes (Signed)
Safety precautions to be maintained throughout the outpatient stay will include: orient to surroundings, keep bed in low position, maintain call bell within reach at all times, provide assistance with transfer out of bed and ambulation.  

## 2015-07-23 NOTE — Progress Notes (Signed)
Patient's Name: Douglas Andrews MRN: [id number] DOB: Jun 29, 1951 DOS: 07/23/2015  Primary Reason(s) for Visit: Interventional Pain Management Treatment. CC: Back Pain   Pre-Procedure Assessment: [name] is a 64 y.o. year old, male patient, seen today for interventional treatment. He has Chronic low back pain; Chronic pain syndrome; DDD (degenerative disc disease), lumbar;  Lumbar Spondylosis; Lumbar radicular pain; Right leg pain; Facet syndrome, lumbar; Opiate use; Uncomplicated opioid dependence ([place]); Long term prescription opiate use; Bilateral chronic knee pain; Vitamin D insufficiency; Overweight; Anxiety state; Bell palsy; Back pain, chronic; CN (constipation); Dermatitis, eczematoid; Diabetes mellitus ([place]); Dysmetria; Essential (primary) hypertension; Anxiety, generalized; Insomnia due to medical condition; Left leg pain; Long term current use of systemic steroids; Edema leg; Type 2 diabetes mellitus ([place]); Pulmonary sarcoidosis ([place]); Spasm; Arthropathy, traumatic, shoulder; Bursitis, trochanteric; Esophageal varices ([place]); and Leg varices on his problem list.. His primarily concern today is the Back Pain Verification of the correct person, correct site (including marking of site), and correct procedure were performed and confirmed by the patient. Today's Vitals   07/23/15 0818 07/23/15 0847 07/23/15 0849 07/23/15 0854  BP:  155/102 180/106 168/108  Pulse:  81 79 76  Temp:      TempSrc:      Resp:  $Remo'10 15 26  'fLIbA$ Height:      Weight:      SpO2:  99% 99% 99%  PainSc: 6    0-No pain  Calculated BMI: Body mass index is 28.03 kg/(m^2). Allergies: He has No Known Allergies.. Primary Diagnosis: Lumbar radicular pain [M54.16]  Procedure: Type: Diagnostic Inter-Laminar Epidural Steroid Injection Region: Lumbar Level: L4-5 Level. Laterality: Left-Sided Paramedial  Indications: Spondylosis, Lumbosacral Region  Consent: Secured. Under the influence of no sedatives a written informed  consent was obtained, after having provided information on the risks and possible complications. To fulfill our ethical and legal obligations, as recommended by the American Medical Association's Code of Ethics, we have provided information to the patient about our clinical impression; the nature and purpose of the treatment or procedure; the risks, benefits, and possible complications of the intervention; alternatives; the risk(s) and benefit(s) of the alternative treatment(s) or procedure(s); and the risk(s) and benefit(s) of doing nothing. The patient was provided information about the risks and possible complications associated with the procedure. In the case of spinal procedures these may include, but are not limited to, failure to achieve desired goals, infection, bleeding, organ or nerve damage, allergic reactions, paralysis, and death. In addition, the patient was informed that Medicine is not an exact science; therefore, there is also the possibility of unforeseen risks and possible complications that may result in a catastrophic outcome. The patient indicated having understood very clearly. We have given the patient no guarantees and we have made no promises. Enough time was given to the patient to ask questions, all of which were answered to the patient's satisfaction.  Pre-Procedure Preparation: Safety Precautions: Allergies reviewed. Appropriate site, procedure, and patient were confirmed by following the Joint Commission's Universal Protocol (UP.01.01.01), in the form of a "Time Out". The patient was asked to confirm marked site and procedure, before commencing. The patient was asked about blood thinners, or active infections, both of which were denied. Patient was assessed for positional comfort and all pressure points were checked before starting procedure. Monitoring:  As per clinic protocol. Infection Control Precautions: Sterile technique used. Standard Universal Precautions were taken as  recommended by the Department of [facility] for Disease Control and Prevention (CDC). Standard pre-surgical skin  prep was conducted. Respiratory hygiene and cough etiquette was practiced. Hand hygiene observed. Safe injection practices and needle disposal techniques followed. SDV (single dose vial) medications used. Medications properly checked for expiration dates and contaminants. Personal protective equipment (PPE) used: Surgical mask. Sterile double glove technique. Radiation resistant gloves. Sterile surgical gloves.  Anesthesia, Analgesia, Anxiolysis: Type: Local Anesthesia Local Anesthetic(s): Lidocaine 1% Route: Subcutaneous IV Access: Declined. Sedation: Declined. Indication(s):Not applicable.  Description of Procedure Process:  Time-out: "Time-out" completed before starting procedure, as per protocol. Position: Prone Target Area: For Epidural Steroid injections, the target area is the  interlaminar space, initially targeting the lower border of the superior vertebral body lamina. Approach: Posterior approach. Area Prepped: Entire Posterior Lumbosacral Region Prepping solution: ChloraPrep (2% chlorhexidine gluconate and 70% isopropyl alcohol) Safety Precautions: Aspiration looking for blood return was conducted prior to all injections. At no point did we inject any substances, as a needle was being advanced. No attempts were made at seeking any paresthesias. Safe injection practices and needle disposal techniques used. Medications properly checked for expiration dates. SDV (single dose vial) medications used. Description of the Procedure: Protocol guidelines were followed. The patient was placed in position over the fluoroscopy table. The target area was identified and the area prepped in the usual manner. Skin desensitized using vapocoolant spray. Skin & deeper tissues infiltrated with local anesthetic. Appropriate amount of time allowed to pass for local anesthetics to take  effect. The procedure needle was introduced through the skin, ipsilateral to the reported pain, and advanced to the target area. Bone was contacted and the needle walked caudad, until the lamina was cleared. The epidural space was identified using "loss-of-resistance technique" with 2-3 ml of PF-NaCl (0.9% NSS), in a 5cc LOR glass syringe. Proper needle placement secured. Negative aspiration confirmed. Solution injected in intermittent fashion, asking for systemic symptoms every 0.5cc of injectate. The needles were then removed and the area cleansed, making sure to leave some of the prepping solution back to take advantage of its long term bactericidal properties. EBL: None Materials & Medications Used:  Needle(s) Used: 20g - 10cm, Tuohy-style epidural needle Medications Administered today: We administered ropivacaine (PF) 2 mg/ml (0.2%), iohexol, lidocaine (PF), sodium chloride, and triamcinolone acetonide.Please see chart orders for dosing details.  Imaging Guidance:  Type of Imaging Technique: Fluoroscopy Guidance (Spinal) Indication(s): Assistance in needle guidance and placement for procedures requiring needle placement in or near specific anatomical locations not easily accessible without such assistance. Exposure Time: Please see nurses notes. Contrast: Before injecting any contrast, we confirmed that the patient did not have an allergy to iodine, shellfish, or radiological contrast. Once satisfactory needle placement was completed at the desired level, radiological contrast was injected. Injection was conducted under continuous fluoroscopic guidance. Injection of contrast accomplished without complications. See chart for type and volume of contrast used. Fluoroscopic Guidance: I was personally present in the fluoroscopy suite, where the patient was placed in position for the procedure, over the fluoroscopy-compatible table. Fluoroscopy was manipulated, using "Tunnel Vision Technique", to obtain  the best possible view of the target area, on the affected side. Parallax error was corrected before commencing the procedure. A "direction-depth-direction" technique was used to introduce the needle under continuous pulsed fluoroscopic guidance. Once the target was reached, antero-posterior, oblique, and lateral fluoroscopic projection views were taken to confirm needle placement in all planes. Permanently recorded images stored by scanning into EMR. Interpretation: Intraoperative imaging interpretation by performing Physician. Adequate needle placement confirmed. Adequate needle placement confirmed in AP, lateral, &  Oblique Views. Appropriate spread of contrast to desired area. No evidence of afferent or efferent intravascular uptake. No intrathecal or subarachnoid spread observed.  Antibiotics:  Type:  Antibiotics Given (last 72 hours)    None      Indication(s): No indications identified.  Post-operative Assessment:  Complications: No immediate post-treatment complications were observed. Relevant Post-operative Information:  Disposition: Return to clinic for follow-up evaluation. The patient tolerated the entire procedure well. A repeat set of vitals were taken after the procedure and the patient was kept under observation following institutional policy, for this procedure. Post-procedural neurological assessment was performed, showing return to baseline, prior to discharge. The patient was discharged home, once institutional criteria were met. The patient was provided with post-procedure discharge instructions, including a section on how to identify potential problems. Should any problems arise concerning this procedure, the patient was given instructions to immediately contact us, at any time, without hesitation. In any case, we plan to contact the patient by telephone for a follow-up status report regarding this interventional procedure. Comments:  No additional relevant information.  Primary  Care Physician: No primary care provider on file. Location: [facility] Note by: Milo Schreier A. Dossie Arbour, M.D, DABA, DABAPM, DABPM, DABIPP, FIPP  Disclaimer:  Medicine is not an exact science. The only guarantee in medicine is that nothing is guaranteed. It is important to note that the decision to proceed with this intervention was based on the information collected from the patient. The Data and conclusions were drawn from the patient's questionnaire, the interview, and the physical examination. Because the information was provided in large part by the patient, it cannot be guaranteed that it has not been purposely or unconsciously manipulated. Every effort has been made to obtain as much relevant data as possible for this evaluation. It is important to note that the conclusions that lead to this procedure are derived in large part from the available data. Always take into account that the treatment will also be dependent on availability of resources and existing treatment guidelines, considered by other Pain Management Practitioners as being common knowledge and practice, at the time of the intervention. For Medico-Legal purposes, it is also important to point out that variation in procedural techniques and pharmacological choices are the acceptable norm. The indications, contraindications, technique, and results of the above procedure should only be interpreted and judged by a Board-Certified Interventional Pain Specialist with extensive familiarity and expertise in the same exact procedure and technique. Attempts at providing opinions without similar or greater experience and expertise than that of the treating physician will be considered as inappropriate and unethical, and shall result in a formal complaint to the state medical board and applicable specialty societies.

## 2015-07-23 NOTE — Patient Instructions (Signed)
Epidural Steroid Injection Patient Information  Description: The epidural space surrounds the nerves as they exit the spinal cord.  In some patients, the nerves can be compressed and inflamed by a bulging disc or a tight spinal canal (spinal stenosis).  By injecting steroids into the epidural space, we can bring irritated nerves into direct contact with a potentially helpful medication.  These steroids act directly on the irritated nerves and can reduce swelling and inflammation which often leads to decreased pain.  Epidural steroids may be injected anywhere along the spine and from the neck to the low back depending upon the location of your pain.   After numbing the skin with local anesthetic (like Novocaine), a small needle is passed into the epidural space slowly.  You may experience a sensation of pressure while this is being done.  The entire block usually last less than 10 minutes.  Conditions which may be treated by epidural steroids:   Low back and leg pain  Neck and arm pain  Spinal stenosis  Post-laminectomy syndrome  Herpes zoster (shingles) pain  Pain from compression fractures  Preparation for the injection:  1. Do not eat any solid food or dairy products within 6 hours of your appointment.  2. You may drink clear liquids up to 2 hours before appointment.  Clear liquids include water, black coffee, juice or soda.  No milk or cream please. 3. You may take your regular medication, including pain medications, with a sip of water before your appointment  Diabetics should hold regular insulin (if taken separately) and take 1/2 normal NPH dos the morning of the procedure.  Carry some sugar containing items with you to your appointment. 4. A driver must accompany you and be prepared to drive you home after your procedure.  5. Bring all your current medications with your. 6. An IV may be inserted and sedation may be given at the discretion of the physician.   7. A blood pressure  cuff, EKG and other monitors will often be applied during the procedure.  Some patients may need to have extra oxygen administered for a short period. 8. You will be asked to provide medical information, including your allergies, prior to the procedure.  We must know immediately if you are taking blood thinners (like Coumadin/Warfarin)  Or if you are allergic to IV iodine contrast (dye). We must know if you could possible be pregnant.  Possible side-effects:  Bleeding from needle site  Infection (rare, may require surgery)  Nerve injury (rare)  Numbness & tingling (temporary)  Difficulty urinating (rare, temporary)  Spinal headache ( a headache worse with upright posture)  Light -headedness (temporary)  Pain at injection site (several days)  Decreased blood pressure (temporary)  Weakness in arm/leg (temporary)  Pressure sensation in back/neck (temporary)  Call if you experience:  Fever/chills associated with headache or increased back/neck pain.  Headache worsened by an upright position.  New onset weakness or numbness of an extremity below the injection site  Hives or difficulty breathing (go to the emergency room)  Inflammation or drainage at the infection site  Severe back/neck pain  Any new symptoms which are concerning to you  Please note:  Although the local anesthetic injected can often make your back or neck feel good for several hours after the injection, the pain will likely return.  It takes 3-7 days for steroids to work in the epidural space.  You may not notice any pain relief for at least that one week.    If effective, we will often do a series of three injections spaced 3-6 weeks apart to maximally decrease your pain.  After the initial series, we generally will wait several months before considering a repeat injection of the same type.  If you have any questions, please call [phone] [facility]  Discharge Instructions  General Discharge Instructions :  If you need to reach your doctor call: Monday-Friday 8:00 am - 4:00 pm at [phone] or toll free [phone].  After clinic hours [phone] to have operator reach doctor.  Bring all of your medication bottles to all your appointments in the pain clinic.  To cancel or reschedule your appointment with Pain Management please remember to call 24 hours in advance to avoid a fee.  Refer to the educational materials which you have been given on: General Risks, I had my Procedure. Discharge Instructions, Post Sedation.  Post Procedure Instructions:  The drugs you were given will stay in your system until tomorrow, so for the next 24 hours you should not drive, make any legal decisions or drink any alcoholic beverages.  You may eat anything you prefer, but it is better to start with liquids then soups and crackers, and gradually work up to solid foods.  Please notify your doctor immediately if you have any unusual bleeding, trouble breathing or pain that is not related to your normal pain.  Depending on the type of procedure that was done, some parts of your body may feel week and/or numb.  This usually clears up by tonight or the next day.  Walk with the use of an assistive device or accompanied by an adult for the 24 hours.  You may use ice on the affected area for the first 24 hours.  Put ice in a Ziploc bag and cover with a towel and place against area 15 minutes on 15 minutes off.  You may switch to heat after 24 hours. 

## 2015-07-24 ENCOUNTER — Telehealth: Payer: Self-pay | Admitting: *Deleted

## 2015-07-24 NOTE — Telephone Encounter (Signed)
Patient states doing well. Only complications was that his blood sugars have been high since yesterday. Instructed him to call PCP and inform of latest BS and let pcp know that he received steroid yesterday. Patient understands instructions and to call for any concerns.

## 2015-08-03 DIAGNOSIS — K7460 Unspecified cirrhosis of liver: Secondary | ICD-10-CM | POA: Insufficient documentation

## 2015-08-04 ENCOUNTER — Other Ambulatory Visit: Payer: Self-pay | Admitting: Pain Medicine

## 2015-08-06 ENCOUNTER — Ambulatory Visit: Payer: Medicare Other | Attending: Pain Medicine | Admitting: Pain Medicine

## 2015-08-06 ENCOUNTER — Encounter: Payer: Self-pay | Admitting: Pain Medicine

## 2015-08-06 VITALS — BP 160/89 | HR 75 | Temp 98.2°F | Resp 18 | Ht 68.0 in | Wt 174.0 lb

## 2015-08-06 DIAGNOSIS — M5136 Other intervertebral disc degeneration, lumbar region: Secondary | ICD-10-CM | POA: Insufficient documentation

## 2015-08-06 DIAGNOSIS — G894 Chronic pain syndrome: Secondary | ICD-10-CM | POA: Diagnosis not present

## 2015-08-06 DIAGNOSIS — F1190 Opioid use, unspecified, uncomplicated: Secondary | ICD-10-CM | POA: Insufficient documentation

## 2015-08-06 DIAGNOSIS — M47816 Spondylosis without myelopathy or radiculopathy, lumbar region: Secondary | ICD-10-CM | POA: Diagnosis not present

## 2015-08-06 DIAGNOSIS — M545 Low back pain: Secondary | ICD-10-CM | POA: Diagnosis not present

## 2015-08-06 DIAGNOSIS — E559 Vitamin D deficiency, unspecified: Secondary | ICD-10-CM | POA: Insufficient documentation

## 2015-08-06 DIAGNOSIS — M549 Dorsalgia, unspecified: Secondary | ICD-10-CM | POA: Diagnosis present

## 2015-08-06 DIAGNOSIS — M5416 Radiculopathy, lumbar region: Secondary | ICD-10-CM | POA: Diagnosis not present

## 2015-08-06 DIAGNOSIS — F329 Major depressive disorder, single episode, unspecified: Secondary | ICD-10-CM | POA: Insufficient documentation

## 2015-08-06 DIAGNOSIS — E119 Type 2 diabetes mellitus without complications: Secondary | ICD-10-CM | POA: Insufficient documentation

## 2015-08-06 DIAGNOSIS — E663 Overweight: Secondary | ICD-10-CM | POA: Diagnosis not present

## 2015-08-06 DIAGNOSIS — I10 Essential (primary) hypertension: Secondary | ICD-10-CM | POA: Diagnosis not present

## 2015-08-06 NOTE — Progress Notes (Signed)
Patient's Name: Douglas Andrews MRN: [id number] DOB: October 11, 1950 DOS: 08/06/2015  Primary Reason(s) for Visit: Post-Procedure evaluation. CC: Back Pain   HPI:   Douglas Andrews is a 64 y.o. year old, male patient, who returns today as an established patient. He has Chronic low back pain; Chronic pain syndrome; DDD (degenerative disc disease), lumbar;  Lumbar Spondylosis; Lumbar radicular pain; Right leg pain; Facet syndrome, lumbar; Opiate use; Uncomplicated opioid dependence (HCC); Long term prescription opiate use; Bilateral chronic knee pain; Vitamin D insufficiency; Overweight; Anxiety state; Bell palsy; Back pain, chronic; CN (constipation); Dermatitis, eczematoid; Diabetes mellitus (HCC); Dysmetria; Essential (primary) hypertension; Anxiety, generalized; Insomnia due to medical condition; Left leg pain; Long term current use of systemic steroids; Edema leg; Type 2 diabetes mellitus (HCC); Pulmonary sarcoidosis (HCC); Spasm; Arthropathy, traumatic, shoulder; Bursitis, trochanteric; Esophageal varices (HCC); Leg varices; Hepatic cirrhosis (HCC); and Major depressive disorder with single episode (HCC) on his problem list.. His primarily concern today is the Back Pain   The patient returns today indicating that he obtained excellent relief with the epidural to the point where he no longer has any leg pain. I have provided him with the option of simply banking on the other injections and to only do those when necessary. He agreed with that and we will be placing a standing order for repeat epidural which will be activated only if he calls.  Post-Procedure Assessment: The patient returns today after having had a left-sided L4-5 lumbar epidural steroid injection under fluoroscopic guidance. Side-effects or Adverse reactions: No significant issues reported. Sedation: No sedation used during procedure. Ultra-Short Term Relief (Initial 30-60 minutes after procedure):  100% Short-Term Relief (Initial 6 hours after  procedure):  100% Long-Term Relief (Initial 2 weeks after procedure):  100% Current Relief (Now):  75%. He refers is having some mild discomfort in the lower back. No leg pain. Interpretation of Results: Ultra-short term relief is a normal physiological response to analgesics and anesthetics provided during the procedure. Short-term relief confirms injected site as etiology of pain. Long term relief is possibly due to sympathetic blockade, or the effects of steroids, if administered during procedure. Persistent relief would suggest effective anti-inflammatory effects from steroids.  Allergies: Douglas Andrews has No Known Allergies.  Meds: The patient has a current medication list which includes the following prescription(s): insulin glargine, aspirin ec, atorvastatin, cetirizine, gabapentin, lisinopril, metformin, morphine, morphine, morphine, nitroglycerin, omeprazole, oxycodone-acetaminophen, oxycodone-acetaminophen, oxycodone-acetaminophen, prednisone, and sertraline. Requested Prescriptions    No prescriptions requested or ordered in this encounter    ROS: Constitutional: Afebrile, no chills, well hydrated and well nourished Gastrointestinal: negative Musculoskeletal:negative Neurological: negative Behavioral/Psych: negative  PFSH: Medical:  Douglas Andrews  has a past medical history of Diabetes mellitus without complication (HCC); Hyperlipidemia; Hypertension; Chronic back pain; and Sarcoidosis (HCC). Family: family history includes Diabetes in his father; Heart disease in his father and mother; Stroke in his father. Surgical:  has past surgical history that includes Appendectomy; Tonsillectomy; and left rod. Tobacco:  reports that he has never smoked. He has never used smokeless tobacco. Alcohol:  reports that he does not drink alcohol. Drug:  reports that he does not use illicit drugs.  Physical Exam: Vitals:  Today's Vitals   08/06/15 1351 08/06/15 1352  BP:  160/89  Pulse:  75   Temp:  98.2 F (36.8 C)  Resp:  18  Height:  (1.727 m)   Weight: 174 lb (78.926 kg)   SpO2:  100%  PainSc: 4  4   PainLoc: Back  Calculated BMI: Body mass index is 26.46 kg/(m^2). General appearance: alert, cooperative, appears stated age and no distress Eyes: conjunctivae/corneas clear. PERRL, EOM's intact. Fundi benign. Lungs: No evidence respiratory distress, no audible rales or ronchi and no use of accessory muscles of respiration Neck: no adenopathy, no carotid bruit, no JVD, supple, symmetrical, trachea midline and thyroid not enlarged, symmetric, no tenderness/mass/nodules Back: symmetric, no curvature. ROM normal. No CVA tenderness. Extremities: extremities normal, atraumatic, no cyanosis or edema Pulses: 2+ and symmetric Skin: Skin color, texture, turgor normal. No rashes or lesions Neurologic: Grossly normal    Assessment: Encounter Diagnosis:  Primary Diagnosis: Lumbar radicular pain [M54.16]  Plan: Douglas Andrews was seen today for back pain.  Diagnoses and all orders for this visit:  Lumbar radicular pain -     LUMBAR EPIDURAL STEROID INJECTION; Standing     Patient Instructions   GENERAL RISKS AND COMPLICATIONS  What are the risk, side effects and possible complications? Generally speaking, most procedures are safe.  However, with any procedure there are risks, side effects, and the possibility of complications.  The risks and complications are dependent upon the sites that are lesioned, or the type of nerve block to be performed.  The closer the procedure is to the spine, the more serious the risks are.  Great care is taken when placing the radio frequency needles, block needles or lesioning probes, but sometimes complications can occur. 1. Infection: Any time there is an injection through the skin, there is a risk of infection.  This is why sterile conditions are used for these blocks.  There are four possible types of infection. 1. Localized skin  infection. 2. Central Nervous System Infection-This can be in the form of Meningitis, which can be deadly. 3. Epidural Infections-This can be in the form of an epidural abscess, which can cause pressure inside of the spine, causing compression of the spinal cord with subsequent paralysis. This would require an emergency surgery to decompress, and there are no guarantees that the patient would recover from the paralysis. 4. Discitis-This is an infection of the intervertebral discs.  It occurs in about 1% of discography procedures.  It is difficult to treat and it may lead to surgery.        2. Pain: the needles have to go through skin and soft tissues, will cause soreness.       3. Damage to internal structures:  The nerves to be lesioned may be near blood vessels or    other nerves which can be potentially damaged.       4. Bleeding: Bleeding is more common if the patient is taking blood thinners such as  aspirin, Coumadin, Ticiid, Plavix, etc., or if he/she have some genetic predisposition  such as hemophilia. Bleeding into the spinal canal can cause compression of the spinal  cord with subsequent paralysis.  This would require an emergency surgery to  decompress and there are no guarantees that the patient would recover from the  paralysis.       5. Pneumothorax:  Puncturing of a lung is a possibility, every time a needle is introduced in  the area of the chest or upper back.  Pneumothorax refers to free air around the  collapsed lung(s), inside of the thoracic cavity (chest cavity).  Another two possible  complications related to a similar event would include: Hemothorax and Chylothorax.   These are variations of the Pneumothorax, where instead of air around the collapsed  lung(s), you may have blood or  chyle, respectively.       6. Spinal headaches: They may occur with any procedures in the area of the spine.       7. Persistent CSF (Cerebro-Spinal Fluid) leakage: This is a rare problem, but may occur   with prolonged intrathecal or epidural catheters either due to the formation of a fistulous  track or a dural tear.       8. Nerve damage: By working so close to the spinal cord, there is always a possibility of  nerve damage, which could be as serious as a permanent spinal cord injury with  paralysis.       9. Death:  Although rare, severe deadly allergic reactions known as "Anaphylactic  reaction" can occur to any of the medications used.      10. Worsening of the symptoms:  We can always make thing worse.  What are the chances of something like this happening? Chances of any of this occuring are extremely low.  By statistics, you have more of a chance of getting killed in a motor vehicle accident: while driving to the hospital than any of the above occurring .  Nevertheless, you should be aware that they are possibilities.  In general, it is similar to taking a shower.  Everybody knows that you can slip, hit your head and get killed.  Does that mean that you should not shower again?  Nevertheless always keep in mind that statistics do not mean anything if you happen to be on the wrong side of them.  Even if a procedure has a 1 (one) in a 1,000,000 (million) chance of going wrong, it you happen to be that one..Also, keep in mind that by statistics, you have more of a chance of having something go wrong when taking medications.  Who should not have this procedure? If you are on a blood thinning medication (e.g. Coumadin, Plavix, see list of "Blood Thinners"), or if you have an active infection going on, you should not have the procedure.  If you are taking any blood thinners, please inform your physician.  How should I prepare for this procedure?  Do not eat or drink anything at least six hours prior to the procedure.  Bring a driver with you .  It cannot be a taxi.  Come accompanied by an adult that can drive you back, and that is strong enough to help you if your legs get weak or numb from the  local anesthetic.  Take all of your medicines the morning of the procedure with just enough water to swallow them.  If you have diabetes, make sure that you are scheduled to have your procedure done first thing in the morning, whenever possible.  If you have diabetes, take only half of your insulin dose and notify our nurse that you have done so as soon as you arrive at the clinic.  If you are diabetic, but only take blood sugar pills (oral hypoglycemic), then do not take them on the morning of your procedure.  You may take them after you have had the procedure.  Do not take aspirin or any aspirin-containing medications, at least eleven (11) days prior to the procedure.  They may prolong bleeding.  Wear loose fitting clothing that may be easy to take off and that you would not mind if it got stained with Betadine or blood.  Do not wear any jewelry or perfume  Remove any nail coloring.  It will interfere with some of our  monitoring equipment.  NOTE: Remember that this is not meant to be interpreted as a complete list of all possible complications.  Unforeseen problems may occur.  BLOOD THINNERS The following drugs contain aspirin or other products, which can cause increased bleeding during surgery and should not be taken for 2 weeks prior to and 1 week after surgery.  If you should need take something for relief of minor pain, you may take acetaminophen which is found in Tylenol,m Datril, Anacin-3 and Panadol. It is not blood thinner. The products listed below are.  Do not take any of the products listed below in addition to any listed on your instruction sheet.  A.P.C or A.P.C with Codeine Codeine Phosphate Capsules #3 Ibuprofen Ridaura  ABC compound Congesprin Imuran rimadil  Advil Cope Indocin Robaxisal  Alka-Seltzer Effervescent Pain Reliever and Antacid Coricidin or Coricidin-D  Indomethacin Rufen  Alka-Seltzer plus Cold Medicine Cosprin Ketoprofen S-A-C Tablets  Anacin Analgesic  Tablets or Capsules Coumadin Korlgesic Salflex  Anacin Extra Strength Analgesic tablets or capsules CP-2 Tablets Lanoril Salicylate  Anaprox Cuprimine Capsules Levenox Salocol  Anexsia-D Dalteparin Magan Salsalate  Anodynos Darvon compound Magnesium Salicylate Sine-off  Ansaid Dasin Capsules Magsal Sodium Salicylate  Anturane Depen Capsules Marnal Soma  APF Arthritis pain formula Dewitt's Pills Measurin Stanback  Argesic Dia-Gesic Meclofenamic Sulfinpyrazone  Arthritis Bayer Timed Release Aspirin Diclofenac Meclomen Sulindac  Arthritis pain formula Anacin Dicumarol Medipren Supac  Analgesic (Safety coated) Arthralgen Diffunasal Mefanamic Suprofen  Arthritis Strength Bufferin Dihydrocodeine Mepro Compound Suprol  Arthropan liquid Dopirydamole Methcarbomol with Aspirin Synalgos  ASA tablets/Enseals Disalcid Micrainin Tagament  Ascriptin Doan's Midol Talwin  Ascriptin A/D Dolene Mobidin Tanderil  Ascriptin Extra Strength Dolobid Moblgesic Ticlid  Ascriptin with Codeine Doloprin or Doloprin with Codeine Momentum Tolectin  Asperbuf Duoprin Mono-gesic Trendar  Aspergum Duradyne Motrin or Motrin IB Triminicin  Aspirin plain, buffered or enteric coated Durasal Myochrisine Trigesic  Aspirin Suppositories Easprin Nalfon Trillsate  Aspirin with Codeine Ecotrin Regular or Extra Strength Naprosyn Uracel  Atromid-S Efficin Naproxen Ursinus  Auranofin Capsules Elmiron Neocylate Vanquish  Axotal Emagrin Norgesic Verin  Azathioprine Empirin or Empirin with Codeine Normiflo Vitamin E  Azolid Emprazil Nuprin Voltaren  Bayer Aspirin plain, buffered or children's or timed BC Tablets or powders Encaprin Orgaran Warfarin Sodium  Buff-a-Comp Enoxaparin Orudis Zorpin  Buff-a-Comp with Codeine Equegesic Os-Cal-Gesic   Buffaprin Excedrin plain, buffered or Extra Strength Oxalid   Bufferin Arthritis Strength Feldene Oxphenbutazone   Bufferin plain or Extra Strength Feldene Capsules Oxycodone with Aspirin    Bufferin with Codeine Fenoprofen Fenoprofen Pabalate or Pabalate-SF   Buffets II Flogesic Panagesic   Buffinol plain or Extra Strength Florinal or Florinal with Codeine Panwarfarin   Buf-Tabs Flurbiprofen Penicillamine   Butalbital Compound Four-way cold tablets Penicillin   Butazolidin Fragmin Pepto-Bismol   Carbenicillin Geminisyn Percodan   Carna Arthritis Reliever Geopen Persantine   Carprofen Gold's salt Persistin   Chloramphenicol Goody's Phenylbutazone   Chloromycetin Haltrain Piroxlcam   Clmetidine heparin Plaquenil   Cllnoril Hyco-pap Ponstel   Clofibrate Hydroxy chloroquine Propoxyphen         Before stopping any of these medications, be sure to consult the physician who ordered them.  Some, such as Coumadin (Warfarin) are ordered to prevent or treat serious conditions such as "deep thrombosis", "pumonary embolisms", and other heart problems.  The amount of time that you may need off of the medication may also vary with the medication and the reason for which you were taking it.  If you are  taking any of these medications, please make sure you notify your pain physician before you undergo any procedures.         Epidural Steroid Injection Patient Information  Description: The epidural space surrounds the nerves as they exit the spinal cord.  In some patients, the nerves can be compressed and inflamed by a bulging disc or a tight spinal canal (spinal stenosis).  By injecting steroids into the epidural space, we can bring irritated nerves into direct contact with a potentially helpful medication.  These steroids act directly on the irritated nerves and can reduce swelling and inflammation which often leads to decreased pain.  Epidural steroids may be injected anywhere along the spine and from the neck to the low back depending upon the location of your pain.   After numbing the skin with local anesthetic (like Novocaine), a small needle is passed into the epidural space slowly.   You may experience a sensation of pressure while this is being done.  The entire block usually last less than 10 minutes.  Conditions which may be treated by epidural steroids:   Low back and leg pain  Neck and arm pain  Spinal stenosis  Post-laminectomy syndrome  Herpes zoster (shingles) pain  Pain from compression fractures  Preparation for the injection:  1. Do not eat any solid food or dairy products within 6 hours of your appointment.  2. You may drink clear liquids up to 2 hours before appointment.  Clear liquids include water, black coffee, juice or soda.  No milk or cream please. 3. You may take your regular medication, including pain medications, with a sip of water before your appointment  Diabetics should hold regular insulin (if taken separately) and take 1/2 normal NPH dos the morning of the procedure.  Carry some sugar containing items with you to your appointment. 4. A driver must accompany you and be prepared to drive you home after your procedure.  5. Bring all your current medications with your. 6. An IV may be inserted and sedation may be given at the discretion of the physician.   7. A blood pressure cuff, EKG and other monitors will often be applied during the procedure.  Some patients may need to have extra oxygen administered for a short period. 8. You will be asked to provide medical information, including your allergies, prior to the procedure.  We must know immediately if you are taking blood thinners (like Coumadin/Warfarin)  Or if you are allergic to IV iodine contrast (dye). We must know if you could possible be pregnant.  Possible side-effects:  Bleeding from needle site  Infection (rare, may require surgery)  Nerve injury (rare)  Numbness & tingling (temporary)  Difficulty urinating (rare, temporary)  Spinal headache ( a headache worse with upright posture)  Light -headedness (temporary)  Pain at injection site (several days)  Decreased  blood pressure (temporary)  Weakness in arm/leg (temporary)  Pressure sensation in back/neck (temporary)  Call if you experience:  Fever/chills associated with headache or increased back/neck pain.  Headache worsened by an upright position.  New onset weakness or numbness of an extremity below the injection site  Hives or difficulty breathing (go to the emergency room)  Inflammation or drainage at the infection site  Severe back/neck pain  Any new symptoms which are concerning to you  Please note:  Although the local anesthetic injected can often make your back or neck feel good for several hours after the injection, the pain will likely return.  It takes 3-7 days for steroids to work in the epidural space.  You may not notice any pain relief for at least that one week.  If effective, we will often do a series of three injections spaced 3-6 weeks apart to maximally decrease your pain.  After the initial series, we generally will wait several months before considering a repeat injection of the same type.  If you have any questions, please call [phone] [facility]   Medications discontinued today:  Medications Discontinued During This Encounter  Medication Reason  . iohexol (OMNIPAQUE) 180 MG/ML injection 5 mL   . lidocaine (PF) (XYLOCAINE) 1 % injection 10 mL   . ropivacaine (PF) 2 mg/ml (0.2%) (NAROPIN) epidural 2 mL   . sodium chloride 0.9 % injection 2 mL   . triamcinolone acetonide (KENALOG-40) injection 40 mg    Medications administered today:  Douglas Andrews had no medications administered during this visit.  Primary Care Physician: No primary care provider on file. Location: [facility] Note by: Onesha Krebbs A. Laban Emperor, M.D, DABA, DABAPM, DABPM, DABIPP, FIPP

## 2015-08-06 NOTE — Progress Notes (Signed)
Safety precautions to be maintained throughout the outpatient stay will include: orient to surroundings, keep bed in low position, maintain call bell within reach at all times, provide assistance with transfer out of bed and ambulation.  

## 2015-08-06 NOTE — Patient Instructions (Signed)
GENERAL RISKS AND COMPLICATIONS  What are the risk, side effects and possible complications? Generally speaking, most procedures are safe.  However, with any procedure there are risks, side effects, and the possibility of complications.  The risks and complications are dependent upon the sites that are lesioned, or the type of nerve block to be performed.  The closer the procedure is to the spine, the more serious the risks are.  Great care is taken when placing the radio frequency needles, block needles or lesioning probes, but sometimes complications can occur. 1. Infection: Any time there is an injection through the skin, there is a risk of infection.  This is why sterile conditions are used for these blocks.  There are four possible types of infection. 1. Localized skin infection. 2. Central Nervous System Infection-This can be in the form of Meningitis, which can be deadly. 3. Epidural Infections-This can be in the form of an epidural abscess, which can cause pressure inside of the spine, causing compression of the spinal cord with subsequent paralysis. This would require an emergency surgery to decompress, and there are no guarantees that the patient would recover from the paralysis. 4. Discitis-This is an infection of the intervertebral discs.  It occurs in about 1% of discography procedures.  It is difficult to treat and it may lead to surgery.        2. Pain: the needles have to go through skin and soft tissues, will cause soreness.       3. Damage to internal structures:  The nerves to be lesioned may be near blood vessels or    other nerves which can be potentially damaged.       4. Bleeding: Bleeding is more common if the patient is taking blood thinners such as  aspirin, Coumadin, Ticiid, Plavix, etc., or if he/she have some genetic predisposition  such as hemophilia. Bleeding into the spinal canal can cause compression of the spinal  cord with subsequent paralysis.  This would require an  emergency surgery to  decompress and there are no guarantees that the patient would recover from the  paralysis.       5. Pneumothorax:  Puncturing of a lung is a possibility, every time a needle is introduced in  the area of the chest or upper back.  Pneumothorax refers to free air around the  collapsed lung(s), inside of the thoracic cavity (chest cavity).  Another two possible  complications related to a similar event would include: Hemothorax and Chylothorax.   These are variations of the Pneumothorax, where instead of air around the collapsed  lung(s), you may have blood or chyle, respectively.       6. Spinal headaches: They may occur with any procedures in the area of the spine.       7. Persistent CSF (Cerebro-Spinal Fluid) leakage: This is a rare problem, but may occur  with prolonged intrathecal or epidural catheters either due to the formation of a fistulous  track or a dural tear.       8. Nerve damage: By working so close to the spinal cord, there is always a possibility of  nerve damage, which could be as serious as a permanent spinal cord injury with  paralysis.       9. Death:  Although rare, severe deadly allergic reactions known as "Anaphylactic  reaction" can occur to any of the medications used.      10. Worsening of the symptoms:  We can always make thing worse.    What are the chances of something like this happening? Chances of any of this occuring are extremely low.  By statistics, you have more of a chance of getting killed in a motor vehicle accident: while driving to the hospital than any of the above occurring .  Nevertheless, you should be aware that they are possibilities.  In general, it is similar to taking a shower.  Everybody knows that you can slip, hit your head and get killed.  Does that mean that you should not shower again?  Nevertheless always keep in mind that statistics do not mean anything if you happen to be on the wrong side of them.  Even if a procedure has a 1  (one) in a 1,000,000 (million) chance of going wrong, it you happen to be that one..Also, keep in mind that by statistics, you have more of a chance of having something go wrong when taking medications.  Who should not have this procedure? If you are on a blood thinning medication (e.g. Coumadin, Plavix, see list of "Blood Thinners"), or if you have an active infection going on, you should not have the procedure.  If you are taking any blood thinners, please inform your physician.  How should I prepare for this procedure?  Do not eat or drink anything at least six hours prior to the procedure.  Bring a driver with you .  It cannot be a taxi.  Come accompanied by an adult that can drive you back, and that is strong enough to help you if your legs get weak or numb from the local anesthetic.  Take all of your medicines the morning of the procedure with just enough water to swallow them.  If you have diabetes, make sure that you are scheduled to have your procedure done first thing in the morning, whenever possible.  If you have diabetes, take only half of your insulin dose and notify our nurse that you have done so as soon as you arrive at the clinic.  If you are diabetic, but only take blood sugar pills (oral hypoglycemic), then do not take them on the morning of your procedure.  You may take them after you have had the procedure.  Do not take aspirin or any aspirin-containing medications, at least eleven (11) days prior to the procedure.  They may prolong bleeding.  Wear loose fitting clothing that may be easy to take off and that you would not mind if it got stained with Betadine or blood.  Do not wear any jewelry or perfume  Remove any nail coloring.  It will interfere with some of our monitoring equipment.  NOTE: Remember that this is not meant to be interpreted as a complete list of all possible complications.  Unforeseen problems may occur.  BLOOD THINNERS The following drugs  contain aspirin or other products, which can cause increased bleeding during surgery and should not be taken for 2 weeks prior to and 1 week after surgery.  If you should need take something for relief of minor pain, you may take acetaminophen which is found in Tylenol,m Datril, Anacin-3 and Panadol. It is not blood thinner. The products listed below are.  Do not take any of the products listed below in addition to any listed on your instruction sheet.  A.P.C or A.P.C with Codeine Codeine Phosphate Capsules #3 Ibuprofen Ridaura  ABC compound Congesprin Imuran rimadil  Advil Cope Indocin Robaxisal  Alka-Seltzer Effervescent Pain Reliever and Antacid Coricidin or Coricidin-D  Indomethacin Rufen    Alka-Seltzer plus Cold Medicine Cosprin Ketoprofen S-A-C Tablets  Anacin Analgesic Tablets or Capsules Coumadin Korlgesic Salflex  Anacin Extra Strength Analgesic tablets or capsules CP-2 Tablets Lanoril Salicylate  Anaprox Cuprimine Capsules Levenox Salocol  Anexsia-D Dalteparin Magan Salsalate  Anodynos Darvon compound Magnesium Salicylate Sine-off  Ansaid Dasin Capsules Magsal Sodium Salicylate  Anturane Depen Capsules Marnal Soma  APF Arthritis pain formula Dewitt's Pills Measurin Stanback  Argesic Dia-Gesic Meclofenamic Sulfinpyrazone  Arthritis Bayer Timed Release Aspirin Diclofenac Meclomen Sulindac  Arthritis pain formula Anacin Dicumarol Medipren Supac  Analgesic (Safety coated) Arthralgen Diffunasal Mefanamic Suprofen  Arthritis Strength Bufferin Dihydrocodeine Mepro Compound Suprol  Arthropan liquid Dopirydamole Methcarbomol with Aspirin Synalgos  ASA tablets/Enseals Disalcid Micrainin Tagament  Ascriptin Doan's Midol Talwin  Ascriptin A/D Dolene Mobidin Tanderil  Ascriptin Extra Strength Dolobid Moblgesic Ticlid  Ascriptin with Codeine Doloprin or Doloprin with Codeine Momentum Tolectin  Asperbuf Duoprin Mono-gesic Trendar  Aspergum Duradyne Motrin or Motrin IB Triminicin  Aspirin  plain, buffered or enteric coated Durasal Myochrisine Trigesic  Aspirin Suppositories Easprin Nalfon Trillsate  Aspirin with Codeine Ecotrin Regular or Extra Strength Naprosyn Uracel  Atromid-S Efficin Naproxen Ursinus  Auranofin Capsules Elmiron Neocylate Vanquish  Axotal Emagrin Norgesic Verin  Azathioprine Empirin or Empirin with Codeine Normiflo Vitamin E  Azolid Emprazil Nuprin Voltaren  Bayer Aspirin plain, buffered or children's or timed BC Tablets or powders Encaprin Orgaran Warfarin Sodium  Buff-a-Comp Enoxaparin Orudis Zorpin  Buff-a-Comp with Codeine Equegesic Os-Cal-Gesic   Buffaprin Excedrin plain, buffered or Extra Strength Oxalid   Bufferin Arthritis Strength Feldene Oxphenbutazone   Bufferin plain or Extra Strength Feldene Capsules Oxycodone with Aspirin   Bufferin with Codeine Fenoprofen Fenoprofen Pabalate or Pabalate-SF   Buffets II Flogesic Panagesic   Buffinol plain or Extra Strength Florinal or Florinal with Codeine Panwarfarin   Buf-Tabs Flurbiprofen Penicillamine   Butalbital Compound Four-way cold tablets Penicillin   Butazolidin Fragmin Pepto-Bismol   Carbenicillin Geminisyn Percodan   Carna Arthritis Reliever Geopen Persantine   Carprofen Gold's salt Persistin   Chloramphenicol Goody's Phenylbutazone   Chloromycetin Haltrain Piroxlcam   Clmetidine heparin Plaquenil   Cllnoril Hyco-pap Ponstel   Clofibrate Hydroxy chloroquine Propoxyphen         Before stopping any of these medications, be sure to consult the physician who ordered them.  Some, such as Coumadin (Warfarin) are ordered to prevent or treat serious conditions such as "deep thrombosis", "pumonary embolisms", and other heart problems.  The amount of time that you may need off of the medication may also vary with the medication and the reason for which you were taking it.  If you are taking any of these medications, please make sure you notify your pain physician before you undergo any  procedures.         Epidural Steroid Injection Patient Information  Description: The epidural space surrounds the nerves as they exit the spinal cord.  In some patients, the nerves can be compressed and inflamed by a bulging disc or a tight spinal canal (spinal stenosis).  By injecting steroids into the epidural space, we can bring irritated nerves into direct contact with a potentially helpful medication.  These steroids act directly on the irritated nerves and can reduce swelling and inflammation which often leads to decreased pain.  Epidural steroids may be injected anywhere along the spine and from the neck to the low back depending upon the location of your pain.   After numbing the skin with local anesthetic (like Novocaine), a small needle is passed   into the epidural space slowly.  You may experience a sensation of pressure while this is being done.  The entire block usually last less than 10 minutes.  Conditions which may be treated by epidural steroids:   Low back and leg pain  Neck and arm pain  Spinal stenosis  Post-laminectomy syndrome  Herpes zoster (shingles) pain  Pain from compression fractures  Preparation for the injection:  1. Do not eat any solid food or dairy products within 6 hours of your appointment.  2. You may drink clear liquids up to 2 hours before appointment.  Clear liquids include water, black coffee, juice or soda.  No milk or cream please. 3. You may take your regular medication, including pain medications, with a sip of water before your appointment  Diabetics should hold regular insulin (if taken separately) and take 1/2 normal NPH dos the morning of the procedure.  Carry some sugar containing items with you to your appointment. 4. A driver must accompany you and be prepared to drive you home after your procedure.  5. Bring all your current medications with your. 6. An IV may be inserted and sedation may be given at the discretion of the  physician.   7. A blood pressure cuff, EKG and other monitors will often be applied during the procedure.  Some patients may need to have extra oxygen administered for a short period. 8. You will be asked to provide medical information, including your allergies, prior to the procedure.  We must know immediately if you are taking blood thinners (like Coumadin/Warfarin)  Or if you are allergic to IV iodine contrast (dye). We must know if you could possible be pregnant.  Possible side-effects:  Bleeding from needle site  Infection (rare, may require surgery)  Nerve injury (rare)  Numbness & tingling (temporary)  Difficulty urinating (rare, temporary)  Spinal headache ( a headache worse with upright posture)  Light -headedness (temporary)  Pain at injection site (several days)  Decreased blood pressure (temporary)  Weakness in arm/leg (temporary)  Pressure sensation in back/neck (temporary)  Call if you experience:  Fever/chills associated with headache or increased back/neck pain.  Headache worsened by an upright position.  New onset weakness or numbness of an extremity below the injection site  Hives or difficulty breathing (go to the emergency room)  Inflammation or drainage at the infection site  Severe back/neck pain  Any new symptoms which are concerning to you  Please note:  Although the local anesthetic injected can often make your back or neck feel good for several hours after the injection, the pain will likely return.  It takes 3-7 days for steroids to work in the epidural space.  You may not notice any pain relief for at least that one week.  If effective, we will often do a series of three injections spaced 3-6 weeks apart to maximally decrease your pain.  After the initial series, we generally will wait several months before considering a repeat injection of the same type.  If you have any questions, please call [phone] [facility] 

## 2015-09-25 DIAGNOSIS — Z9181 History of falling: Secondary | ICD-10-CM | POA: Insufficient documentation

## 2015-10-07 ENCOUNTER — Other Ambulatory Visit: Payer: Self-pay | Admitting: Pain Medicine

## 2015-10-07 ENCOUNTER — Ambulatory Visit: Payer: Medicare Other | Attending: Pain Medicine | Admitting: Pain Medicine

## 2015-10-07 ENCOUNTER — Encounter: Payer: Self-pay | Admitting: Pain Medicine

## 2015-10-07 VITALS — BP 156/99 | HR 79 | Temp 98.1°F | Resp 16 | Ht 68.0 in | Wt 180.0 lb

## 2015-10-07 DIAGNOSIS — M545 Low back pain: Secondary | ICD-10-CM | POA: Diagnosis not present

## 2015-10-07 DIAGNOSIS — M47896 Other spondylosis, lumbar region: Secondary | ICD-10-CM | POA: Insufficient documentation

## 2015-10-07 DIAGNOSIS — M5116 Intervertebral disc disorders with radiculopathy, lumbar region: Secondary | ICD-10-CM | POA: Diagnosis not present

## 2015-10-07 DIAGNOSIS — M4726 Other spondylosis with radiculopathy, lumbar region: Secondary | ICD-10-CM

## 2015-10-07 DIAGNOSIS — M5416 Radiculopathy, lumbar region: Secondary | ICD-10-CM | POA: Diagnosis not present

## 2015-10-07 DIAGNOSIS — M4180 Other forms of scoliosis, site unspecified: Secondary | ICD-10-CM

## 2015-10-07 DIAGNOSIS — D869 Sarcoidosis, unspecified: Secondary | ICD-10-CM | POA: Diagnosis not present

## 2015-10-07 DIAGNOSIS — E663 Overweight: Secondary | ICD-10-CM | POA: Diagnosis not present

## 2015-10-07 DIAGNOSIS — M25561 Pain in right knee: Secondary | ICD-10-CM | POA: Insufficient documentation

## 2015-10-07 DIAGNOSIS — G8929 Other chronic pain: Secondary | ICD-10-CM | POA: Insufficient documentation

## 2015-10-07 DIAGNOSIS — K7460 Unspecified cirrhosis of liver: Secondary | ICD-10-CM | POA: Diagnosis not present

## 2015-10-07 DIAGNOSIS — K5900 Constipation, unspecified: Secondary | ICD-10-CM | POA: Diagnosis not present

## 2015-10-07 DIAGNOSIS — M79606 Pain in leg, unspecified: Secondary | ICD-10-CM | POA: Diagnosis not present

## 2015-10-07 DIAGNOSIS — L309 Dermatitis, unspecified: Secondary | ICD-10-CM | POA: Diagnosis not present

## 2015-10-07 DIAGNOSIS — F1190 Opioid use, unspecified, uncomplicated: Secondary | ICD-10-CM | POA: Diagnosis not present

## 2015-10-07 DIAGNOSIS — M25562 Pain in left knee: Secondary | ICD-10-CM | POA: Insufficient documentation

## 2015-10-07 DIAGNOSIS — E119 Type 2 diabetes mellitus without complications: Secondary | ICD-10-CM | POA: Diagnosis not present

## 2015-10-07 DIAGNOSIS — Z7189 Other specified counseling: Secondary | ICD-10-CM

## 2015-10-07 DIAGNOSIS — F329 Major depressive disorder, single episode, unspecified: Secondary | ICD-10-CM | POA: Diagnosis not present

## 2015-10-07 DIAGNOSIS — F419 Anxiety disorder, unspecified: Secondary | ICD-10-CM | POA: Insufficient documentation

## 2015-10-07 DIAGNOSIS — E785 Hyperlipidemia, unspecified: Secondary | ICD-10-CM | POA: Insufficient documentation

## 2015-10-07 DIAGNOSIS — E559 Vitamin D deficiency, unspecified: Secondary | ICD-10-CM | POA: Insufficient documentation

## 2015-10-07 DIAGNOSIS — M7550 Bursitis of unspecified shoulder: Secondary | ICD-10-CM | POA: Insufficient documentation

## 2015-10-07 DIAGNOSIS — Z5181 Encounter for therapeutic drug level monitoring: Secondary | ICD-10-CM | POA: Diagnosis not present

## 2015-10-07 DIAGNOSIS — I10 Essential (primary) hypertension: Secondary | ICD-10-CM | POA: Insufficient documentation

## 2015-10-07 DIAGNOSIS — M549 Dorsalgia, unspecified: Secondary | ICD-10-CM | POA: Diagnosis present

## 2015-10-07 MED ORDER — ORPHENADRINE CITRATE 30 MG/ML IJ SOLN
INTRAMUSCULAR | Status: AC
  Administered 2015-10-07: 60 mg via INTRAMUSCULAR
  Filled 2015-10-07: qty 2

## 2015-10-07 MED ORDER — ORPHENADRINE CITRATE 30 MG/ML IJ SOLN
60.0000 mg | Freq: Once | INTRAMUSCULAR | Status: AC
  Administered 2015-10-07: 60 mg via INTRAMUSCULAR

## 2015-10-07 MED ORDER — KETOROLAC TROMETHAMINE 60 MG/2ML IM SOLN
INTRAMUSCULAR | Status: AC
  Administered 2015-10-07: 60 mg via INTRAMUSCULAR
  Filled 2015-10-07: qty 2

## 2015-10-07 MED ORDER — KETOROLAC TROMETHAMINE 60 MG/2ML IM SOLN
60.0000 mg | Freq: Once | INTRAMUSCULAR | Status: AC
  Administered 2015-10-07: 60 mg via INTRAMUSCULAR

## 2015-10-07 NOTE — Progress Notes (Signed)
Patient's Name: Douglas Andrews MRN: [id number] DOB: Aug 28, 1951 DOS: 10/07/2015  Primary Reason(s) for Visit: Encounter for Medication Management CC: Back Pain   HPI:    Douglas Andrews is a 64 y.o. year old, male patient, who returns today as an established patient. He has Chronic low back pain (Location of Primary Source of Pain) (B) (L>R); Chronic pain syndrome;  Lumbar DDD (degenerative disc disease);  Lumbar Spondylosis; Lumbar radicular pain (Left) (L5); Lumbar facet syndrome (Bilateral) (L>R); Opiate use; Uncomplicated opioid dependence (HCC); Long term prescription opiate use; Chronic knee pain (Bilateral) (L>R); Vitamin D insufficiency; Overweight; Bell palsy; CN (constipation); Dermatitis, eczematoid; Diabetes mellitus (HCC); Dysmetria; Essential (primary) hypertension; Anxiety, generalized; Insomnia due to medical condition; Long term current use of systemic steroids; Type 2 diabetes mellitus (HCC); Pulmonary sarcoidosis (HCC); Arthropathy, traumatic, shoulder; Bursitis, trochanteric; Esophageal varices (HCC); Leg varices; Hepatic cirrhosis (HCC); Major depressive disorder with single episode (HCC); Other long term (current) drug therapy; At risk for falling; Encounter for chronic pain management; Encounter for therapeutic drug level monitoring; Chronic lower extremity pain (Bilateral) (L>R); and Chronic lumbar radicular pain (Left) (L5) on his problem list.. His primarily concern today is the Back Pain     The patient returns to the clinics today with a flareup of his usual left-sided low back and leg pain. Last time we did a left-sided L4-5 lumbar epidural steroid injection which provided him with several months of good relief of the pain. The patient indicates that he is not sure what sure the pain but it has come back. Today we will provide him with a Toradol/Norflex IM injection to help with the acute phase of the pain and we will have him come back for a repeat left-sided L4-5 lumbar epidural  steroid injection (#2) under fluoroscopic guidance. In addition, we will repeat his Lumbar x-ray and MRI. The x-ray will be done on flexion and extension.  Today's Pain Score: 7 , clinically he looks like a 3-4/10. Reported level of pain is incompatible with clinical obrservations. This may be secondary to a possible lack of understanding on how the pain scale works. Pain Type: Chronic pain Pain Location: Back Pain Orientation: Mid, Lower  Date of Last Visit: 08/06/15 Service Provided on Last Visit: Med Refill  Pharmacotherapy Review:   Side-effects or Adverse reactions: None reported Effectiveness: Described as relatively effective, allowing for increase in activities of daily living (ADL) Onset of action: Within expected pharmacological parameters Duration of action: Within normal limits for medication Peak effect: Timing and results are as within normal expected parameters [place] PMP: Compliant with practice rules and regulations UDS Results: The patient's last UDS collected on 07/15/2015 was within normal limits and as expected. The patient remains compliant. UDS Interpretation: Patient appears to be compliant with practice rules and regulations Medication Assessment Form: Reviewed. Patient indicates being compliant with therapy Treatment compliance: Compliant Substance Use Disorder (SUD) Risk Level: Low Pharmacologic Plan: Continue therapy as is  Lab Work: Inflammation Markers No results found for: ESRSEDRATE, CRP  Renal Function No results found for: BUN, CREATININE, GFRAA, GFRNONAA  Hepatic Function No results found for: AST, ALT, ALBUMIN  Electrolytes No results found for: NA, K, CL, CALCIUM, MG  Illicit Drugs No results found for: THCU, COCAINSCRNUR, PCPSCRNUR, MDMA, AMPHETMU, METHADONE, ETOH  Allergies:  Douglas Andrews has No Known Allergies.  Meds:  The patient has a current medication list which includes the following prescription(s): albuterol, aspirin ec,  atorvastatin, cetirizine, vitamin d3, gabapentin, glucose blood, hydroxyzine, insulin glargine, insulin lispro, insulin  pen needle, lisinopril, metformin, morphine, nitroglycerin, omeprazole, oxycodone-acetaminophen, prednisone, and sertraline. Requested Prescriptions    No prescriptions requested or ordered in this encounter    ROS:  Constitutional: Afebrile, no chills, well hydrated and well nourished Gastrointestinal: negative Musculoskeletal:negative Neurological: negative Behavioral/Psych: negative  PFSH:  Medical:  Douglas Andrews  has a past medical history of Diabetes mellitus without complication (HCC); Hyperlipidemia; Hypertension; Chronic back pain; Sarcoidosis (HCC); Right leg pain (07/15/2015); and Left leg pain (01/02/2014). Family: family history includes Diabetes in his father; Heart disease in his father and mother; Stroke in his father. Surgical:  has past surgical history that includes Appendectomy; Tonsillectomy; and left rod. Tobacco:  reports that he has never smoked. He has never used smokeless tobacco. Alcohol:  reports that he does not drink alcohol. Drug:  reports that he does not use illicit drugs.  Physical Exam:  Vitals:  Today's Vitals   10/07/15 1349 10/07/15 1351  BP:  156/99  Pulse: 79   Temp: 98.1 F (36.7 C)   Resp: 16   Height:  (1.727 m)   Weight: 180 lb (81.647 kg)   SpO2: 100%   PainSc: 7  7   PainLoc: Back   Calculated BMI: Body mass index is 27.38 kg/(m^2). General appearance: alert, cooperative, appears stated age and mild distress Eyes: PERLA Respiratory: No evidence respiratory distress, no audible rales or ronchi and no use of accessory muscles of respiration Neck: no adenopathy, no carotid bruit, no JVD, supple, symmetrical, trachea midline and thyroid not enlarged, symmetric, no tenderness/mass/nodules  Cervical Spine ROM: Adequate for flexion, extension, rotation, and lateral bending Palpation: No palpable trigger  points  Upper Extremities ROM: Adequate bilaterally Strength: 5/5 for all flexors and extensors of the upper extremity, bilaterally Pulses: Palpable bilaterally Neurologic: No allodynia, No hyperesthesia, No hyperpathia and No sensory abnormalities  Lumbar Spine ROM: Adequate for flexion, extension, rotation, and lateral bending Palpation: No palpable trigger points Lumbar Hyperextension and rotation: Non-contributory Patrick's Maneuver: Non-contributory  Lower Extremities ROM: Adequate bilaterally Strength: 5/5 for all flexors and extensors of the lower extremity, bilaterally Pulses: Palpable bilaterally Neurologic: No allodynia, No hyperesthesia, No hyperpathia, No sensory abnormalities and No antalgic gait or posture  Assessment:  Encounter Diagnosis:  Primary Diagnosis: Lumbar radicular pain [M54.16]  Plan:   Interventional Therapies:  Return to clinic for a left-sided L4-5 lumbar epidural steroid injection under fluoroscopic guidance, no sedation.  Douglas Andrews was seen today for back pain and back pain.  Diagnoses and all orders for this visit:  Lumbar radicular pain -     LUMBAR EPIDURAL STEROID INJECTION; Future -     orphenadrine (NORFLEX) injection 60 mg; Inject 2 mLs (60 mg total) into the muscle once. -     ketorolac (TORADOL) injection 60 mg; Inject 2 mLs (60 mg total) into the muscle once.   Lumbar Spondylosis  Encounter for chronic pain management  Encounter for therapeutic drug level monitoring  Chronic pain of lower extremity, unspecified laterality  Chronic lumbar radicular pain (Left) (L5)  Other orders -     orphenadrine (NORFLEX) 30 MG/ML injection;  -     ketorolac (TORADOL) 60 MG/2ML injection;      There are no Patient Instructions on file for this visit. Medications discontinued today:  Medications Discontinued During This Encounter  Medication Reason  . albuterol (PROVENTIL) (5 MG/ML) 0.5% nebulizer solution Error  . aspirin EC 81 MG tablet  Error  . atorvastatin (LIPITOR) 40 MG tablet Error  . cetirizine (ZYRTEC) 10 MG  tablet Error  . Insulin Glargine (LANTUS SOLOSTAR) 100 UNIT/ML Solostar Pen Error  . lisinopril (PRINIVIL,ZESTRIL) 5 MG tablet Error  . metFORMIN (GLUCOPHAGE XR) 500 MG 24 hr tablet Error  . morphine (MS CONTIN) 30 MG 12 hr tablet Error  . morphine (MS CONTIN) 30 MG 12 hr tablet Error  . morphine (MSIR) 30 MG tablet Error  . nitroGLYCERIN (NITROSTAT) 0.4 MG SL tablet Error  . omeprazole (PRILOSEC) 20 MG capsule Error  . oxyCODONE-acetaminophen (PERCOCET) 10-325 MG tablet Error  . oxyCODONE-acetaminophen (PERCOCET) 10-325 MG tablet Error  . oxyCODONE-acetaminophen (PERCOCET) 10-325 MG tablet Error  . predniSONE (DELTASONE) 5 MG tablet Error  . sertraline (ZOLOFT) 50 MG tablet Error   Medications administered today:  We administered orphenadrine, ketorolac, orphenadrine, and ketorolac.  Primary Care Physician: No primary care provider on file. Location: [facility] Note by: Kaison Mcparland A. Laban Emperor, M.D, DABA, DABAPM, DABPM, DABIPP, FIPP

## 2015-10-07 NOTE — Progress Notes (Signed)
Safety precautions to be maintained throughout the outpatient stay will include: orient to surroundings, keep bed in low position, maintain call bell within reach at all times, provide assistance with transfer out of bed and ambulation. Pill count MS contin #15 Percocet # 17

## 2015-10-08 ENCOUNTER — Ambulatory Visit: Payer: Medicare Other | Attending: Pain Medicine | Admitting: Pain Medicine

## 2015-10-08 ENCOUNTER — Encounter: Payer: Self-pay | Admitting: Pain Medicine

## 2015-10-08 VITALS — BP 165/90 | HR 73 | Temp 98.0°F | Resp 18 | Ht 68.0 in | Wt 180.0 lb

## 2015-10-08 DIAGNOSIS — M549 Dorsalgia, unspecified: Secondary | ICD-10-CM | POA: Diagnosis present

## 2015-10-08 DIAGNOSIS — M545 Low back pain: Secondary | ICD-10-CM | POA: Insufficient documentation

## 2015-10-08 DIAGNOSIS — E559 Vitamin D deficiency, unspecified: Secondary | ICD-10-CM | POA: Insufficient documentation

## 2015-10-08 DIAGNOSIS — F329 Major depressive disorder, single episode, unspecified: Secondary | ICD-10-CM | POA: Insufficient documentation

## 2015-10-08 DIAGNOSIS — G8929 Other chronic pain: Secondary | ICD-10-CM | POA: Insufficient documentation

## 2015-10-08 DIAGNOSIS — E119 Type 2 diabetes mellitus without complications: Secondary | ICD-10-CM | POA: Insufficient documentation

## 2015-10-08 DIAGNOSIS — L309 Dermatitis, unspecified: Secondary | ICD-10-CM | POA: Insufficient documentation

## 2015-10-08 DIAGNOSIS — M7060 Trochanteric bursitis, unspecified hip: Secondary | ICD-10-CM | POA: Diagnosis not present

## 2015-10-08 DIAGNOSIS — M4726 Other spondylosis with radiculopathy, lumbar region: Secondary | ICD-10-CM

## 2015-10-08 DIAGNOSIS — D860 Sarcoidosis of lung: Secondary | ICD-10-CM | POA: Insufficient documentation

## 2015-10-08 DIAGNOSIS — I10 Essential (primary) hypertension: Secondary | ICD-10-CM | POA: Diagnosis not present

## 2015-10-08 DIAGNOSIS — F411 Generalized anxiety disorder: Secondary | ICD-10-CM | POA: Insufficient documentation

## 2015-10-08 DIAGNOSIS — M5416 Radiculopathy, lumbar region: Secondary | ICD-10-CM

## 2015-10-08 DIAGNOSIS — M5136 Other intervertebral disc degeneration, lumbar region: Secondary | ICD-10-CM | POA: Diagnosis not present

## 2015-10-08 DIAGNOSIS — M47817 Spondylosis without myelopathy or radiculopathy, lumbosacral region: Secondary | ICD-10-CM | POA: Insufficient documentation

## 2015-10-08 DIAGNOSIS — E663 Overweight: Secondary | ICD-10-CM | POA: Insufficient documentation

## 2015-10-08 DIAGNOSIS — G510 Bell's palsy: Secondary | ICD-10-CM | POA: Insufficient documentation

## 2015-10-08 DIAGNOSIS — M25562 Pain in left knee: Secondary | ICD-10-CM | POA: Insufficient documentation

## 2015-10-08 DIAGNOSIS — M129 Arthropathy, unspecified: Secondary | ICD-10-CM | POA: Diagnosis not present

## 2015-10-08 DIAGNOSIS — K7460 Unspecified cirrhosis of liver: Secondary | ICD-10-CM | POA: Diagnosis not present

## 2015-10-08 DIAGNOSIS — M25561 Pain in right knee: Secondary | ICD-10-CM | POA: Diagnosis not present

## 2015-10-08 DIAGNOSIS — K5900 Constipation, unspecified: Secondary | ICD-10-CM | POA: Insufficient documentation

## 2015-10-08 DIAGNOSIS — F1190 Opioid use, unspecified, uncomplicated: Secondary | ICD-10-CM | POA: Diagnosis not present

## 2015-10-08 MED ORDER — ROPIVACAINE HCL 2 MG/ML IJ SOLN
INTRAMUSCULAR | Status: AC
  Administered 2015-10-08: 10:00:00
  Filled 2015-10-08: qty 10

## 2015-10-08 MED ORDER — IOHEXOL 180 MG/ML  SOLN
INTRAMUSCULAR | Status: AC
  Administered 2015-10-08: 10:00:00
  Filled 2015-10-08: qty 20

## 2015-10-08 MED ORDER — LIDOCAINE HCL (PF) 1 % IJ SOLN: 10.0000 mL | Freq: Once | INTRAMUSCULAR | Status: DC

## 2015-10-08 MED ORDER — TRIAMCINOLONE ACETONIDE 40 MG/ML IJ SUSP
INTRAMUSCULAR | Status: AC
  Administered 2015-10-08: 10:00:00
  Filled 2015-10-08: qty 1

## 2015-10-08 MED ORDER — IOHEXOL 180 MG/ML  SOLN: 10.0000 mL | Freq: Once | INTRAMUSCULAR | Status: DC | PRN

## 2015-10-08 MED ORDER — ROPIVACAINE HCL 2 MG/ML IJ SOLN
INTRAMUSCULAR | Status: AC
  Filled 2015-10-08: qty 10

## 2015-10-08 MED ORDER — SODIUM CHLORIDE 0.9 % IJ SOLN
INTRAMUSCULAR | Status: AC
  Administered 2015-10-08: 10:00:00
  Filled 2015-10-08: qty 10

## 2015-10-08 MED ORDER — LIDOCAINE HCL (PF) 1 % IJ SOLN
INTRAMUSCULAR | Status: AC
Start: 2015-10-08 — End: 2015-10-08
  Administered 2015-10-08: 10:00:00
  Filled 2015-10-08: qty 5

## 2015-10-08 MED ORDER — SODIUM CHLORIDE 0.9 % IJ SOLN: 2.0000 mL | Freq: Once | INTRAMUSCULAR | Status: DC

## 2015-10-08 MED ORDER — TRIAMCINOLONE ACETONIDE 40 MG/ML IJ SUSP: 40.0000 mg | Freq: Once | INTRAMUSCULAR | Status: DC

## 2015-10-08 MED ORDER — ROPIVACAINE HCL 2 MG/ML IJ SOLN: 2.0000 mL | Freq: Once | INTRAMUSCULAR | Status: DC

## 2015-10-08 NOTE — Progress Notes (Signed)
Safety precautions to be maintained throughout the outpatient stay will include: orient to surroundings, keep bed in low position, maintain call bell within reach at all times, provide assistance with transfer out of bed and ambulation.  States pain is better today from injections yesterday

## 2015-10-08 NOTE — Patient Instructions (Signed)
GENERAL RISKS AND COMPLICATIONS  What are the risk, side effects and possible complications? Generally speaking, most procedures are safe.  However, with any procedure there are risks, side effects, and the possibility of complications.  The risks and complications are dependent upon the sites that are lesioned, or the type of nerve block to be performed.  The closer the procedure is to the spine, the more serious the risks are.  Great care is taken when placing the radio frequency needles, block needles or lesioning probes, but sometimes complications can occur. 1. Infection: Any time there is an injection through the skin, there is a risk of infection.  This is why sterile conditions are used for these blocks.  There are four possible types of infection. 1. Localized skin infection. 2. Central Nervous System Infection-This can be in the form of Meningitis, which can be deadly. 3. Epidural Infections-This can be in the form of an epidural abscess, which can cause pressure inside of the spine, causing compression of the spinal cord with subsequent paralysis. This would require an emergency surgery to decompress, and there are no guarantees that the patient would recover from the paralysis. 4. Discitis-This is an infection of the intervertebral discs.  It occurs in about 1% of discography procedures.  It is difficult to treat and it may lead to surgery.        2. Pain: the needles have to go through skin and soft tissues, will cause soreness.       3. Damage to internal structures:  The nerves to be lesioned may be near blood vessels or    other nerves which can be potentially damaged.       4. Bleeding: Bleeding is more common if the patient is taking blood thinners such as  aspirin, Coumadin, Ticiid, Plavix, etc., or if he/she have some genetic predisposition  such as hemophilia. Bleeding into the spinal canal can cause compression of the spinal  cord with subsequent paralysis.  This would require an  emergency surgery to  decompress and there are no guarantees that the patient would recover from the  paralysis.       5. Pneumothorax:  Puncturing of a lung is a possibility, every time a needle is introduced in  the area of the chest or upper back.  Pneumothorax refers to free air around the  collapsed lung(s), inside of the thoracic cavity (chest cavity).  Another two possible  complications related to a similar event would include: Hemothorax and Chylothorax.   These are variations of the Pneumothorax, where instead of air around the collapsed  lung(s), you may have blood or chyle, respectively.       6. Spinal headaches: They may occur with any procedures in the area of the spine.       7. Persistent CSF (Cerebro-Spinal Fluid) leakage: This is a rare problem, but may occur  with prolonged intrathecal or epidural catheters either due to the formation of a fistulous  track or a dural tear.       8. Nerve damage: By working so close to the spinal cord, there is always a possibility of  nerve damage, which could be as serious as a permanent spinal cord injury with  paralysis.       9. Death:  Although rare, severe deadly allergic reactions known as "Anaphylactic  reaction" can occur to any of the medications used.      10. Worsening of the symptoms:  We can always make thing worse.    What are the chances of something like this happening? Chances of any of this occuring are extremely low.  By statistics, you have more of a chance of getting killed in a motor vehicle accident: while driving to the hospital than any of the above occurring .  Nevertheless, you should be aware that they are possibilities.  In general, it is similar to taking a shower.  Everybody knows that you can slip, hit your head and get killed.  Does that mean that you should not shower again?  Nevertheless always keep in mind that statistics do not mean anything if you happen to be on the wrong side of them.  Even if a procedure has a 1  (one) in a 1,000,000 (million) chance of going wrong, it you happen to be that one..Also, keep in mind that by statistics, you have more of a chance of having something go wrong when taking medications.  Who should not have this procedure? If you are on a blood thinning medication (e.g. Coumadin, Plavix, see list of "Blood Thinners"), or if you have an active infection going on, you should not have the procedure.  If you are taking any blood thinners, please inform your physician.  How should I prepare for this procedure?  Do not eat or drink anything at least six hours prior to the procedure.  Bring a driver with you .  It cannot be a taxi.  Come accompanied by an adult that can drive you back, and that is strong enough to help you if your legs get weak or numb from the local anesthetic.  Take all of your medicines the morning of the procedure with just enough water to swallow them.  If you have diabetes, make sure that you are scheduled to have your procedure done first thing in the morning, whenever possible.  If you have diabetes, take only half of your insulin dose and notify our nurse that you have done so as soon as you arrive at the clinic.  If you are diabetic, but only take blood sugar pills (oral hypoglycemic), then do not take them on the morning of your procedure.  You may take them after you have had the procedure.  Do not take aspirin or any aspirin-containing medications, at least eleven (11) days prior to the procedure.  They may prolong bleeding.  Wear loose fitting clothing that may be easy to take off and that you would not mind if it got stained with Betadine or blood.  Do not wear any jewelry or perfume  Remove any nail coloring.  It will interfere with some of our monitoring equipment.  NOTE: Remember that this is not meant to be interpreted as a complete list of all possible complications.  Unforeseen problems may occur.  BLOOD THINNERS The following drugs  contain aspirin or other products, which can cause increased bleeding during surgery and should not be taken for 2 weeks prior to and 1 week after surgery.  If you should need take something for relief of minor pain, you may take acetaminophen which is found in Tylenol,m Datril, Anacin-3 and Panadol. It is not blood thinner. The products listed below are.  Do not take any of the products listed below in addition to any listed on your instruction sheet.  A.P.C or A.P.C with Codeine Codeine Phosphate Capsules #3 Ibuprofen Ridaura  ABC compound Congesprin Imuran rimadil  Advil Cope Indocin Robaxisal  Alka-Seltzer Effervescent Pain Reliever and Antacid Coricidin or Coricidin-D  Indomethacin Rufen    Alka-Seltzer plus Cold Medicine Cosprin Ketoprofen S-A-C Tablets  Anacin Analgesic Tablets or Capsules Coumadin Korlgesic Salflex  Anacin Extra Strength Analgesic tablets or capsules CP-2 Tablets Lanoril Salicylate  Anaprox Cuprimine Capsules Levenox Salocol  Anexsia-D Dalteparin Magan Salsalate  Anodynos Darvon compound Magnesium Salicylate Sine-off  Ansaid Dasin Capsules Magsal Sodium Salicylate  Anturane Depen Capsules Marnal Soma  APF Arthritis pain formula Dewitt's Pills Measurin Stanback  Argesic Dia-Gesic Meclofenamic Sulfinpyrazone  Arthritis Bayer Timed Release Aspirin Diclofenac Meclomen Sulindac  Arthritis pain formula Anacin Dicumarol Medipren Supac  Analgesic (Safety coated) Arthralgen Diffunasal Mefanamic Suprofen  Arthritis Strength Bufferin Dihydrocodeine Mepro Compound Suprol  Arthropan liquid Dopirydamole Methcarbomol with Aspirin Synalgos  ASA tablets/Enseals Disalcid Micrainin Tagament  Ascriptin Doan's Midol Talwin  Ascriptin A/D Dolene Mobidin Tanderil  Ascriptin Extra Strength Dolobid Moblgesic Ticlid  Ascriptin with Codeine Doloprin or Doloprin with Codeine Momentum Tolectin  Asperbuf Duoprin Mono-gesic Trendar  Aspergum Duradyne Motrin or Motrin IB Triminicin  Aspirin  plain, buffered or enteric coated Durasal Myochrisine Trigesic  Aspirin Suppositories Easprin Nalfon Trillsate  Aspirin with Codeine Ecotrin Regular or Extra Strength Naprosyn Uracel  Atromid-S Efficin Naproxen Ursinus  Auranofin Capsules Elmiron Neocylate Vanquish  Axotal Emagrin Norgesic Verin  Azathioprine Empirin or Empirin with Codeine Normiflo Vitamin E  Azolid Emprazil Nuprin Voltaren  Bayer Aspirin plain, buffered or children's or timed BC Tablets or powders Encaprin Orgaran Warfarin Sodium  Buff-a-Comp Enoxaparin Orudis Zorpin  Buff-a-Comp with Codeine Equegesic Os-Cal-Gesic   Buffaprin Excedrin plain, buffered or Extra Strength Oxalid   Bufferin Arthritis Strength Feldene Oxphenbutazone   Bufferin plain or Extra Strength Feldene Capsules Oxycodone with Aspirin   Bufferin with Codeine Fenoprofen Fenoprofen Pabalate or Pabalate-SF   Buffets II Flogesic Panagesic   Buffinol plain or Extra Strength Florinal or Florinal with Codeine Panwarfarin   Buf-Tabs Flurbiprofen Penicillamine   Butalbital Compound Four-way cold tablets Penicillin   Butazolidin Fragmin Pepto-Bismol   Carbenicillin Geminisyn Percodan   Carna Arthritis Reliever Geopen Persantine   Carprofen Gold's salt Persistin   Chloramphenicol Goody's Phenylbutazone   Chloromycetin Haltrain Piroxlcam   Clmetidine heparin Plaquenil   Cllnoril Hyco-pap Ponstel   Clofibrate Hydroxy chloroquine Propoxyphen         Before stopping any of these medications, be sure to consult the physician who ordered them.  Some, such as Coumadin (Warfarin) are ordered to prevent or treat serious conditions such as "deep thrombosis", "pumonary embolisms", and other heart problems.  The amount of time that you may need off of the medication may also vary with the medication and the reason for which you were taking it.  If you are taking any of these medications, please make sure you notify your pain physician before you undergo any  procedures.         Pain Management Discharge Instructions  General Discharge Instructions :  If you need to reach your doctor call: Monday-Friday 8:00 am - 4:00 pm at [phone] or toll free [phone].  After clinic hours [phone] to have operator reach doctor.  Bring all of your medication bottles to all your appointments in the pain clinic.  To cancel or reschedule your appointment with Pain Management please remember to call 24 hours in advance to avoid a fee.  Refer to the educational materials which you have been given on: General Risks, I had my Procedure. Discharge Instructions, Post Sedation.  Post Procedure Instructions:  The drugs you were given will stay in your system until tomorrow, so for the next 24 hours you should   not drive, make any legal decisions or drink any alcoholic beverages.  You may eat anything you prefer, but it is better to start with liquids then soups and crackers, and gradually work up to solid foods.  Please notify your doctor immediately if you have any unusual bleeding, trouble breathing or pain that is not related to your normal pain.  Depending on the type of procedure that was done, some parts of your body may feel week and/or numb.  This usually clears up by tonight or the next day.  Walk with the use of an assistive device or accompanied by an adult for the 24 hours.  You may use ice on the affected area for the first 24 hours.  Put ice in a Ziploc bag and cover with a towel and place against area 15 minutes on 15 minutes off.  You may switch to heat after 24 hours.Epidural Steroid Injection Patient Information  Description: The epidural space surrounds the nerves as they exit the spinal cord.  In some patients, the nerves can be compressed and inflamed by a bulging disc or a tight spinal canal (spinal stenosis).  By injecting steroids into the epidural space, we can bring irritated nerves into direct contact with a potentially  helpful medication.  These steroids act directly on the irritated nerves and can reduce swelling and inflammation which often leads to decreased pain.  Epidural steroids may be injected anywhere along the spine and from the neck to the low back depending upon the location of your pain.   After numbing the skin with local anesthetic (like Novocaine), a small needle is passed into the epidural space slowly.  You may experience a sensation of pressure while this is being done.  The entire block usually last less than 10 minutes.  Conditions which may be treated by epidural steroids:   Low back and leg pain  Neck and arm pain  Spinal stenosis  Post-laminectomy syndrome  Herpes zoster (shingles) pain  Pain from compression fractures  Preparation for the injection:  1. Do not eat any solid food or dairy products within 6 hours of your appointment.  2. You may drink clear liquids up to 2 hours before appointment.  Clear liquids include water, black coffee, juice or soda.  No milk or cream please. 3. You may take your regular medication, including pain medications, with a sip of water before your appointment  Diabetics should hold regular insulin (if taken separately) and take 1/2 normal NPH dos the morning of the procedure.  Carry some sugar containing items with you to your appointment. 4. A driver must accompany you and be prepared to drive you home after your procedure.  5. Bring all your current medications with your. 6. An IV may be inserted and sedation may be given at the discretion of the physician.   7. A blood pressure cuff, EKG and other monitors will often be applied during the procedure.  Some patients may need to have extra oxygen administered for a short period. 8. You will be asked to provide medical information, including your allergies, prior to the procedure.  We must know immediately if you are taking blood thinners (like Coumadin/Warfarin)  Or if you are allergic to IV iodine  contrast (dye). We must know if you could possible be pregnant.  Possible side-effects:  Bleeding from needle site  Infection (rare, may require surgery)  Nerve injury (rare)  Numbness & tingling (temporary)  Difficulty urinating (rare, temporary)  Spinal headache (   a headache worse with upright posture)  Light -headedness (temporary)  Pain at injection site (several days)  Decreased blood pressure (temporary)  Weakness in arm/leg (temporary)  Pressure sensation in back/neck (temporary)  Call if you experience:  Fever/chills associated with headache or increased back/neck pain.  Headache worsened by an upright position.  New onset weakness or numbness of an extremity below the injection site  Hives or difficulty breathing (go to the emergency room)  Inflammation or drainage at the infection site  Severe back/neck pain  Any new symptoms which are concerning to you  Please note:  Although the local anesthetic injected can often make your back or neck feel good for several hours after the injection, the pain will likely return.  It takes 3-7 days for steroids to work in the epidural space.  You may not notice any pain relief for at least that one week.  If effective, we will often do a series of three injections spaced 3-6 weeks apart to maximally decrease your pain.  After the initial series, we generally will wait several months before considering a repeat injection of the same type.  If you have any questions, please call [phone] [facility] 

## 2015-10-08 NOTE — Progress Notes (Signed)
Patient's Name: Douglas Andrews MRN: [id number] DOB: [birth year] DOS: 10/08/2015  Primary Reason(s) for Visit: Interventional Pain Management Treatment. CC: Back Pain   Pre-Procedure Assessment:  Douglas Andrews is a 64 y.o. year old, male patient, seen today for interventional treatment. He has Chronic low back pain (Location of Primary Source of Pain) (B) (L>R); Chronic pain syndrome;  Lumbar DDD (degenerative disc disease);  Lumbar Spondylosis; Lumbar radicular pain (Left) (L5); Lumbar facet syndrome (Bilateral) (L>R); Opiate use; Uncomplicated opioid dependence (HCC); Long term prescription opiate use; Chronic knee pain (Bilateral) (L>R); Vitamin D insufficiency; Overweight; Bell palsy; CN (constipation); Dermatitis, eczematoid; Diabetes mellitus (HCC); Dysmetria; Essential (primary) hypertension; Anxiety, generalized; Insomnia due to medical condition; Long term current use of systemic steroids; Type 2 diabetes mellitus (HCC); Pulmonary sarcoidosis (HCC); Arthropathy, traumatic, shoulder; Bursitis, trochanteric; Esophageal varices (HCC); Leg varices; Hepatic cirrhosis (HCC); Major depressive disorder with single episode (HCC); Other long term (current) drug therapy; At risk for falling; Encounter for chronic pain management; Encounter for therapeutic drug level monitoring; Chronic lower extremity pain (Bilateral) (L>R); Chronic lumbar radicular pain (Left) (L5); and Lumbar Levoscoliosis on his problem list.. His primarily concern today is the Back Pain   Today's Pain Score: 5  Reported level of pain is compatible with clinical observation Pain Type: Chronic pain Pain Location: Back Pain Orientation: Lower Pain Descriptors / Indicators: Aching, Radiating (pt states aom os better frpm medications yesterday) Pain Frequency: Constant  Date of Last Visit: 10/07/15 Service Provided on Last Visit: Procedure (hip injections)  Verification of the correct person, correct site (including marking of site), and  correct procedure were performed and confirmed by the patient.  Today's Vitals   10/08/15 0904 10/08/15 0905  BP: 173/80   Pulse: 65   Temp: 98 F (36.7 C)   TempSrc: Oral   Resp: 18   Height: 5\' 8"  (1.727 m)   Weight: 180 lb (81.647 kg)   SpO2: 100%   PainSc:  5   Calculated BMI: Body mass index is 27.38 kg/(m^2). Allergies: He has No Known Allergies.. Primary Diagnosis: Lumbar radicular pain [M54.16]  Procedure:  Type: Palliative Inter-Laminar Epidural Steroid Injection Region: Lumbar Level: L4-5 Level. Laterality: Left-Sided Paramedial  Indications: Spondylosis, Lumbosacral Region  Consent: Secured. Under the influence of no sedatives a written informed consent was obtained, after having provided information on the risks and possible complications. To fulfill our ethical and legal obligations, as recommended by the American Medical Association's Code of Ethics, we have provided information to the patient about our clinical impression; the nature and purpose of the treatment or procedure; the risks, benefits, and possible complications of the intervention; alternatives; the risk(s) and benefit(s) of the alternative treatment(s) or procedure(s); and the risk(s) and benefit(s) of doing nothing. The patient was provided information about the risks and possible complications associated with the procedure. In the case of spinal procedures these may include, but are not limited to, failure to achieve desired goals, infection, bleeding, organ or nerve damage, allergic reactions, paralysis, and death. In addition, the patient was informed that Medicine is not an exact science; therefore, there is also the possibility of unforeseen risks and possible complications that may result in a catastrophic outcome. The patient indicated having understood very clearly. We have given the patient no guarantees and we have made no promises. Enough time was given to the patient to ask questions, all of which  were answered to the patient's satisfaction.  Pre-Procedure Preparation: Safety Precautions: Allergies reviewed. Appropriate site, procedure, and patient were confirmed  by following the Joint Commission's Universal Protocol (UP.01.01.01), in the form of a "Time Out". The patient was asked to confirm marked site and procedure, before commencing. The patient was asked about blood thinners, or active infections, both of which were denied. Patient was assessed for positional comfort and all pressure points were checked before starting procedure. Monitoring:  As per clinic protocol. Infection Control Precautions: Sterile technique used. Standard Universal Precautions were taken as recommended by the Department of [facility] for Disease Control and Prevention (CDC). Standard pre-surgical skin prep was conducted. Respiratory hygiene and cough etiquette was practiced. Hand hygiene observed. Safe injection practices and needle disposal techniques followed. SDV (single dose vial) medications used. Medications properly checked for expiration dates and contaminants. Personal protective equipment (PPE) used: Surgical mask. Sterile double glove technique. Radiation resistant gloves. Sterile surgical gloves.  Anesthesia, Analgesia, Anxiolysis: Type: Local Anesthesia Local Anesthetic(s): Lidocaine 1% Route: Subcutaneous IV Access: Declined. Sedation: Declined. Indication(s):Not applicable.  Description of Procedure Process:  Time-out: "Time-out" completed before starting procedure, as per protocol. Position: Prone Target Area: For Epidural Steroid injections, the target area is the  interlaminar space, initially targeting the lower border of the superior vertebral body lamina. Approach: Posterior approach. Area Prepped: Entire Posterior Lumbosacral Region Prepping solution: ChloraPrep (2% chlorhexidine gluconate and 70% isopropyl alcohol) Safety Precautions: Aspiration looking for blood return was  conducted prior to all injections. At no point did we inject any substances, as a needle was being advanced. No attempts were made at seeking any paresthesias. Safe injection practices and needle disposal techniques used. Medications properly checked for expiration dates. SDV (single dose vial) medications used. Description of the Procedure: Protocol guidelines were followed. The patient was placed in position over the fluoroscopy table. The target area was identified and the area prepped in the usual manner. Skin desensitized using vapocoolant spray. Skin & deeper tissues infiltrated with local anesthetic. Appropriate amount of time allowed to pass for local anesthetics to take effect. The procedure needle was introduced through the skin, ipsilateral to the reported pain, and advanced to the target area. Bone was contacted and the needle walked caudad, until the lamina was cleared. The epidural space was identified using "loss-of-resistance technique" with 2-3 ml of PF-NaCl (0.9% NSS), in a 5cc LOR glass syringe. Proper needle placement secured. Negative aspiration confirmed. Solution injected in intermittent fashion, asking for systemic symptoms every 0.5cc of injectate. The needles were then removed and the area cleansed, making sure to leave some of the prepping solution back to take advantage of its long term bactericidal properties. EBL: None Materials & Medications Used:  Needle(s) Used: 20g - 10cm, Tuohy-style epidural needle Medications Administered today: Mr. Violett had no medications administered during this visit.Please see chart orders for dosing details.  Imaging Guidance:  Type of Imaging Technique: Fluoroscopy Guidance (Spinal) Indication(s): Assistance in needle guidance and placement for procedures requiring needle placement in or near specific anatomical locations not easily accessible without such assistance. Exposure Time: Please see nurses notes. Contrast: Before injecting any  contrast, we confirmed that the patient did not have an allergy to iodine, shellfish, or radiological contrast. Once satisfactory needle placement was completed at the desired level, radiological contrast was injected. Injection was conducted under continuous fluoroscopic guidance. Injection of contrast accomplished without complications. See chart for type and volume of contrast used. Fluoroscopic Guidance: I was personally present in the fluoroscopy suite, where the patient was placed in position for the procedure, over the fluoroscopy-compatible table. Fluoroscopy was manipulated, using "Tunnel  Vision Technique", to obtain the best possible view of the target area, on the affected side. Parallax error was corrected before commencing the procedure. A "direction-depth-direction" technique was used to introduce the needle under continuous pulsed fluoroscopic guidance. Once the target was reached, antero-posterior, oblique, and lateral fluoroscopic projection views were taken to confirm needle placement in all planes. Permanently recorded images stored by scanning into EMR. Interpretation: Intraoperative imaging interpretation by performing Physician. Adequate needle placement confirmed. Adequate needle placement confirmed in AP, lateral, & Oblique Views. Appropriate spread of contrast to desired area. No evidence of afferent or efferent intravascular uptake. No intrathecal or subarachnoid spread observed. Permanent hardcopy images in multiple planes scanned into the patient's record.  Antibiotics:  Type:  Antibiotics Given (last 72 hours)    None      Indication(s): No indications identified.  Post-operative Assessment:  Complications: No immediate post-treatment complications were observed. Relevant Post-operative Information:  Disposition: Return to clinic for follow-up evaluation. The patient tolerated the entire procedure well. A repeat set of vitals were taken after the procedure and the patient  was kept under observation following institutional policy, for this procedure. Post-procedural neurological assessment was performed, showing return to baseline, prior to discharge. The patient was discharged home, once institutional criteria were met. The patient was provided with post-procedure discharge instructions, including a section on how to identify potential problems. Should any problems arise concerning this procedure, the patient was given instructions to immediately contact us, at any time, without hesitation. In any case, we plan to contact the patient by telephone for a follow-up status report regarding this interventional procedure. Comments:  No additional relevant information.  Primary Care Physician: No primary care provider on file. Location: [facility] Note by: Michole Lecuyer A. Dossie Arbour, M.D, DABA, DABAPM, DABPM, DABIPP, FIPP  Disclaimer:  Medicine is not an exact science. The only guarantee in medicine is that nothing is guaranteed. It is important to note that the decision to proceed with this intervention was based on the information collected from the patient. The Data and conclusions were drawn from the patient's questionnaire, the interview, and the physical examination. Because the information was provided in large part by the patient, it cannot be guaranteed that it has not been purposely or unconsciously manipulated. Every effort has been made to obtain as much relevant data as possible for this evaluation. It is important to note that the conclusions that lead to this procedure are derived in large part from the available data. Always take into account that the treatment will also be dependent on availability of resources and existing treatment guidelines, considered by other Pain Management Practitioners as being common knowledge and practice, at the time of the intervention. For Medico-Legal purposes, it is also important to point out that variation  in procedural techniques and pharmacological choices are the acceptable norm. The indications, contraindications, technique, and results of the above procedure should only be interpreted and judged by a Board-Certified Interventional Pain Specialist with extensive familiarity and expertise in the same exact procedure and technique. Attempts at providing opinions without similar or greater experience and expertise than that of the treating physician will be considered as inappropriate and unethical, and shall result in a formal complaint to the state medical board and applicable specialty societies.

## 2015-10-13 ENCOUNTER — Telehealth: Payer: Self-pay | Admitting: *Deleted

## 2015-10-13 LAB — TOXASSURE SELECT 13 (MW), URINE: PDF: 0

## 2015-10-13 NOTE — Telephone Encounter (Signed)
Spoke with patient re; procedure from last week.  Patient denies any complications and pain is better.

## 2015-10-14 ENCOUNTER — Encounter: Payer: Medicare Other | Admitting: Pain Medicine

## 2015-10-22 ENCOUNTER — Ambulatory Visit: Payer: Medicare Other | Admitting: Pain Medicine

## 2015-11-19 ENCOUNTER — Other Ambulatory Visit: Payer: Self-pay | Admitting: Pain Medicine

## 2015-11-19 ENCOUNTER — Encounter: Payer: Self-pay | Admitting: Pain Medicine

## 2015-11-19 ENCOUNTER — Ambulatory Visit: Payer: Medicare Other | Attending: Pain Medicine | Admitting: Pain Medicine

## 2015-11-19 VITALS — BP 151/93 | HR 72 | Temp 98.0°F | Resp 17 | Ht 68.0 in | Wt 180.0 lb

## 2015-11-19 DIAGNOSIS — G894 Chronic pain syndrome: Secondary | ICD-10-CM

## 2015-11-19 DIAGNOSIS — M5116 Intervertebral disc disorders with radiculopathy, lumbar region: Secondary | ICD-10-CM | POA: Insufficient documentation

## 2015-11-19 DIAGNOSIS — E663 Overweight: Secondary | ICD-10-CM | POA: Insufficient documentation

## 2015-11-19 DIAGNOSIS — Z5181 Encounter for therapeutic drug level monitoring: Secondary | ICD-10-CM

## 2015-11-19 DIAGNOSIS — M549 Dorsalgia, unspecified: Secondary | ICD-10-CM | POA: Diagnosis present

## 2015-11-19 DIAGNOSIS — L309 Dermatitis, unspecified: Secondary | ICD-10-CM | POA: Insufficient documentation

## 2015-11-19 DIAGNOSIS — M5416 Radiculopathy, lumbar region: Secondary | ICD-10-CM

## 2015-11-19 DIAGNOSIS — G8929 Other chronic pain: Secondary | ICD-10-CM | POA: Diagnosis not present

## 2015-11-19 DIAGNOSIS — E119 Type 2 diabetes mellitus without complications: Secondary | ICD-10-CM | POA: Diagnosis not present

## 2015-11-19 DIAGNOSIS — G510 Bell's palsy: Secondary | ICD-10-CM | POA: Diagnosis not present

## 2015-11-19 DIAGNOSIS — M7550 Bursitis of unspecified shoulder: Secondary | ICD-10-CM | POA: Diagnosis not present

## 2015-11-19 DIAGNOSIS — E559 Vitamin D deficiency, unspecified: Secondary | ICD-10-CM | POA: Diagnosis not present

## 2015-11-19 DIAGNOSIS — M79605 Pain in left leg: Secondary | ICD-10-CM | POA: Insufficient documentation

## 2015-11-19 DIAGNOSIS — Z7952 Long term (current) use of systemic steroids: Secondary | ICD-10-CM | POA: Insufficient documentation

## 2015-11-19 DIAGNOSIS — F1120 Opioid dependence, uncomplicated: Secondary | ICD-10-CM | POA: Insufficient documentation

## 2015-11-19 DIAGNOSIS — I10 Essential (primary) hypertension: Secondary | ICD-10-CM | POA: Diagnosis not present

## 2015-11-19 DIAGNOSIS — M79602 Pain in left arm: Secondary | ICD-10-CM

## 2015-11-19 DIAGNOSIS — M25561 Pain in right knee: Secondary | ICD-10-CM | POA: Insufficient documentation

## 2015-11-19 DIAGNOSIS — K5900 Constipation, unspecified: Secondary | ICD-10-CM | POA: Diagnosis not present

## 2015-11-19 DIAGNOSIS — M25562 Pain in left knee: Secondary | ICD-10-CM | POA: Insufficient documentation

## 2015-11-19 DIAGNOSIS — F329 Major depressive disorder, single episode, unspecified: Secondary | ICD-10-CM | POA: Diagnosis not present

## 2015-11-19 DIAGNOSIS — M545 Low back pain: Secondary | ICD-10-CM | POA: Diagnosis not present

## 2015-11-19 MED ORDER — OXYCODONE-ACETAMINOPHEN 10-325 MG PO TABS: 1.0000 | ORAL_TABLET | Freq: Four times a day (QID) | ORAL | Status: DC | PRN

## 2015-11-19 NOTE — Patient Instructions (Signed)
GENERAL RISKS AND COMPLICATIONS  What are the risk, side effects and possible complications? Generally speaking, most procedures are safe.  However, with any procedure there are risks, side effects, and the possibility of complications.  The risks and complications are dependent upon the sites that are lesioned, or the type of nerve block to be performed.  The closer the procedure is to the spine, the more serious the risks are.  Great care is taken when placing the radio frequency needles, block needles or lesioning probes, but sometimes complications can occur. 1. Infection: Any time there is an injection through the skin, there is a risk of infection.  This is why sterile conditions are used for these blocks.  There are four possible types of infection. 1. Localized skin infection. 2. Central Nervous System Infection-This can be in the form of Meningitis, which can be deadly. 3. Epidural Infections-This can be in the form of an epidural abscess, which can cause pressure inside of the spine, causing compression of the spinal cord with subsequent paralysis. This would require an emergency surgery to decompress, and there are no guarantees that the patient would recover from the paralysis. 4. Discitis-This is an infection of the intervertebral discs.  It occurs in about 1% of discography procedures.  It is difficult to treat and it may lead to surgery.        2. Pain: the needles have to go through skin and soft tissues, will cause soreness.       3. Damage to internal structures:  The nerves to be lesioned may be near blood vessels or    other nerves which can be potentially damaged.       4. Bleeding: Bleeding is more common if the patient is taking blood thinners such as  aspirin, Coumadin, Ticiid, Plavix, etc., or if he/she have some genetic predisposition  such as hemophilia. Bleeding into the spinal canal can cause compression of the spinal  cord with subsequent paralysis.  This would require an  emergency surgery to  decompress and there are no guarantees that the patient would recover from the  paralysis.       5. Pneumothorax:  Puncturing of a lung is a possibility, every time a needle is introduced in  the area of the chest or upper back.  Pneumothorax refers to free air around the  collapsed lung(s), inside of the thoracic cavity (chest cavity).  Another two possible  complications related to a similar event would include: Hemothorax and Chylothorax.   These are variations of the Pneumothorax, where instead of air around the collapsed  lung(s), you may have blood or chyle, respectively.       6. Spinal headaches: They may occur with any procedures in the area of the spine.       7. Persistent CSF (Cerebro-Spinal Fluid) leakage: This is a rare problem, but may occur  with prolonged intrathecal or epidural catheters either due to the formation of a fistulous  track or a dural tear.       8. Nerve damage: By working so close to the spinal cord, there is always a possibility of  nerve damage, which could be as serious as a permanent spinal cord injury with  paralysis.       9. Death:  Although rare, severe deadly allergic reactions known as "Anaphylactic  reaction" can occur to any of the medications used.      10. Worsening of the symptoms:  We can always make thing worse.    What are the chances of something like this happening? Chances of any of this occuring are extremely low.  By statistics, you have more of a chance of getting killed in a motor vehicle accident: while driving to the hospital than any of the above occurring .  Nevertheless, you should be aware that they are possibilities.  In general, it is similar to taking a shower.  Everybody knows that you can slip, hit your head and get killed.  Does that mean that you should not shower again?  Nevertheless always keep in mind that statistics do not mean anything if you happen to be on the wrong side of them.  Even if a procedure has a 1  (one) in a 1,000,000 (million) chance of going wrong, it you happen to be that one..Also, keep in mind that by statistics, you have more of a chance of having something go wrong when taking medications.  Who should not have this procedure? If you are on a blood thinning medication (e.g. Coumadin, Plavix, see list of "Blood Thinners"), or if you have an active infection going on, you should not have the procedure.  If you are taking any blood thinners, please inform your physician.  How should I prepare for this procedure?  Do not eat or drink anything at least six hours prior to the procedure.  Bring a driver with you .  It cannot be a taxi.  Come accompanied by an adult that can drive you back, and that is strong enough to help you if your legs get weak or numb from the local anesthetic.  Take all of your medicines the morning of the procedure with just enough water to swallow them.  If you have diabetes, make sure that you are scheduled to have your procedure done first thing in the morning, whenever possible.  If you have diabetes, take only half of your insulin dose and notify our nurse that you have done so as soon as you arrive at the clinic.  If you are diabetic, but only take blood sugar pills (oral hypoglycemic), then do not take them on the morning of your procedure.  You may take them after you have had the procedure.  Do not take aspirin or any aspirin-containing medications, at least eleven (11) days prior to the procedure.  They may prolong bleeding.  Wear loose fitting clothing that may be easy to take off and that you would not mind if it got stained with Betadine or blood.  Do not wear any jewelry or perfume  Remove any nail coloring.  It will interfere with some of our monitoring equipment.  NOTE: Remember that this is not meant to be interpreted as a complete list of all possible complications.  Unforeseen problems may occur.  BLOOD THINNERS The following drugs  contain aspirin or other products, which can cause increased bleeding during surgery and should not be taken for 2 weeks prior to and 1 week after surgery.  If you should need take something for relief of minor pain, you may take acetaminophen which is found in Tylenol,m Datril, Anacin-3 and Panadol. It is not blood thinner. The products listed below are.  Do not take any of the products listed below in addition to any listed on your instruction sheet.  A.P.C or A.P.C with Codeine Codeine Phosphate Capsules #3 Ibuprofen Ridaura  ABC compound Congesprin Imuran rimadil  Advil Cope Indocin Robaxisal  Alka-Seltzer Effervescent Pain Reliever and Antacid Coricidin or Coricidin-D  Indomethacin Rufen    Alka-Seltzer plus Cold Medicine Cosprin Ketoprofen S-A-C Tablets  Anacin Analgesic Tablets or Capsules Coumadin Korlgesic Salflex  Anacin Extra Strength Analgesic tablets or capsules CP-2 Tablets Lanoril Salicylate  Anaprox Cuprimine Capsules Levenox Salocol  Anexsia-D Dalteparin Magan Salsalate  Anodynos Darvon compound Magnesium Salicylate Sine-off  Ansaid Dasin Capsules Magsal Sodium Salicylate  Anturane Depen Capsules Marnal Soma  APF Arthritis pain formula Dewitt's Pills Measurin Stanback  Argesic Dia-Gesic Meclofenamic Sulfinpyrazone  Arthritis Bayer Timed Release Aspirin Diclofenac Meclomen Sulindac  Arthritis pain formula Anacin Dicumarol Medipren Supac  Analgesic (Safety coated) Arthralgen Diffunasal Mefanamic Suprofen  Arthritis Strength Bufferin Dihydrocodeine Mepro Compound Suprol  Arthropan liquid Dopirydamole Methcarbomol with Aspirin Synalgos  ASA tablets/Enseals Disalcid Micrainin Tagament  Ascriptin Doan's Midol Talwin  Ascriptin A/D Dolene Mobidin Tanderil  Ascriptin Extra Strength Dolobid Moblgesic Ticlid  Ascriptin with Codeine Doloprin or Doloprin with Codeine Momentum Tolectin  Asperbuf Duoprin Mono-gesic Trendar  Aspergum Duradyne Motrin or Motrin IB Triminicin  Aspirin  plain, buffered or enteric coated Durasal Myochrisine Trigesic  Aspirin Suppositories Easprin Nalfon Trillsate  Aspirin with Codeine Ecotrin Regular or Extra Strength Naprosyn Uracel  Atromid-S Efficin Naproxen Ursinus  Auranofin Capsules Elmiron Neocylate Vanquish  Axotal Emagrin Norgesic Verin  Azathioprine Empirin or Empirin with Codeine Normiflo Vitamin E  Azolid Emprazil Nuprin Voltaren  Bayer Aspirin plain, buffered or children's or timed BC Tablets or powders Encaprin Orgaran Warfarin Sodium  Buff-a-Comp Enoxaparin Orudis Zorpin  Buff-a-Comp with Codeine Equegesic Os-Cal-Gesic   Buffaprin Excedrin plain, buffered or Extra Strength Oxalid   Bufferin Arthritis Strength Feldene Oxphenbutazone   Bufferin plain or Extra Strength Feldene Capsules Oxycodone with Aspirin   Bufferin with Codeine Fenoprofen Fenoprofen Pabalate or Pabalate-SF   Buffets II Flogesic Panagesic   Buffinol plain or Extra Strength Florinal or Florinal with Codeine Panwarfarin   Buf-Tabs Flurbiprofen Penicillamine   Butalbital Compound Four-way cold tablets Penicillin   Butazolidin Fragmin Pepto-Bismol   Carbenicillin Geminisyn Percodan   Carna Arthritis Reliever Geopen Persantine   Carprofen Gold's salt Persistin   Chloramphenicol Goody's Phenylbutazone   Chloromycetin Haltrain Piroxlcam   Clmetidine heparin Plaquenil   Cllnoril Hyco-pap Ponstel   Clofibrate Hydroxy chloroquine Propoxyphen         Before stopping any of these medications, be sure to consult the physician who ordered them.  Some, such as Coumadin (Warfarin) are ordered to prevent or treat serious conditions such as "deep thrombosis", "pumonary embolisms", and other heart problems.  The amount of time that you may need off of the medication may also vary with the medication and the reason for which you were taking it.  If you are taking any of these medications, please make sure you notify your pain physician before you undergo any  procedures.         Epidural Steroid Injection Patient Information  Description: The epidural space surrounds the nerves as they exit the spinal cord.  In some patients, the nerves can be compressed and inflamed by a bulging disc or a tight spinal canal (spinal stenosis).  By injecting steroids into the epidural space, we can bring irritated nerves into direct contact with a potentially helpful medication.  These steroids act directly on the irritated nerves and can reduce swelling and inflammation which often leads to decreased pain.  Epidural steroids may be injected anywhere along the spine and from the neck to the low back depending upon the location of your pain.   After numbing the skin with local anesthetic (like Novocaine), a small needle is passed   into the epidural space slowly.  You may experience a sensation of pressure while this is being done.  The entire block usually last less than 10 minutes.  Conditions which may be treated by epidural steroids:   Low back and leg pain  Neck and arm pain  Spinal stenosis  Post-laminectomy syndrome  Herpes zoster (shingles) pain  Pain from compression fractures  Preparation for the injection:  1. Do not eat any solid food or dairy products within 6 hours of your appointment.  2. You may drink clear liquids up to 2 hours before appointment.  Clear liquids include water, black coffee, juice or soda.  No milk or cream please. 3. You may take your regular medication, including pain medications, with a sip of water before your appointment  Diabetics should hold regular insulin (if taken separately) and take 1/2 normal NPH dos the morning of the procedure.  Carry some sugar containing items with you to your appointment. 4. A driver must accompany you and be prepared to drive you home after your procedure.  5. Bring all your current medications with your. 6. An IV may be inserted and sedation may be given at the discretion of the  physician.   7. A blood pressure cuff, EKG and other monitors will often be applied during the procedure.  Some patients may need to have extra oxygen administered for a short period. 8. You will be asked to provide medical information, including your allergies, prior to the procedure.  We must know immediately if you are taking blood thinners (like Coumadin/Warfarin)  Or if you are allergic to IV iodine contrast (dye). We must know if you could possible be pregnant.  Possible side-effects:  Bleeding from needle site  Infection (rare, may require surgery)  Nerve injury (rare)  Numbness & tingling (temporary)  Difficulty urinating (rare, temporary)  Spinal headache ( a headache worse with upright posture)  Light -headedness (temporary)  Pain at injection site (several days)  Decreased blood pressure (temporary)  Weakness in arm/leg (temporary)  Pressure sensation in back/neck (temporary)  Call if you experience:  Fever/chills associated with headache or increased back/neck pain.  Headache worsened by an upright position.  New onset weakness or numbness of an extremity below the injection site  Hives or difficulty breathing (go to the emergency room)  Inflammation or drainage at the infection site  Severe back/neck pain  Any new symptoms which are concerning to you  Please note:  Although the local anesthetic injected can often make your back or neck feel good for several hours after the injection, the pain will likely return.  It takes 3-7 days for steroids to work in the epidural space.  You may not notice any pain relief for at least that one week.  If effective, we will often do a series of three injections spaced 3-6 weeks apart to maximally decrease your pain.  After the initial series, we generally will wait several months before considering a repeat injection of the same type.  If you have any questions, please call [phone] [facility] 

## 2015-11-19 NOTE — Progress Notes (Signed)
Safety precautions to be maintained throughout the outpatient stay will include: orient to surroundings, keep bed in low position, maintain call bell within reach at all times, provide assistance with transfer out of bed and ambulation. Oxycodone pill count # 0/60  Filled 10-12-15 Morphine pill count # 0/60  Filled 10-12-15

## 2015-11-19 NOTE — Progress Notes (Signed)
Patient's Name: Douglas Andrews MRN: [id number] DOB: August 02, 1951 DOS: 11/19/2015  Primary Reason(s) for Visit: Encounter for Medication Management and postprocedure evaluation. CC: Back Pain   HPI  [name] is a 65 y.o. year old, male patient, who returns today as an established patient. He has Chronic low back pain (Location of Primary Source of Pain) (Bilateral) (R>L); Chronic pain syndrome;  Lumbar DDD (degenerative disc disease);  Lumbar Spondylosis; Lumbar radicular pain (Right) (L5 Dermatome); Lumbar facet syndrome (Bilateral) (R>L); Opiate use; Uncomplicated opioid dependence (HCC); Long term prescription opiate use; Chronic knee pain (Bilateral) (L>R); Vitamin D insufficiency; Overweight; Bell palsy; CN (constipation); Dermatitis, eczematoid; Diabetes mellitus (HCC); Dysmetria; Essential (primary) hypertension; Anxiety, generalized; Insomnia due to medical condition; Long term current use of systemic steroids; Type 2 diabetes mellitus (HCC); Pulmonary sarcoidosis (HCC); Arthropathy, traumatic, shoulder; Bursitis, trochanteric; Esophageal varices (HCC); Leg varices; Hepatic cirrhosis (HCC); Major depressive disorder with single episode (HCC); Other long term (current) drug therapy; At risk for falling; Encounter for chronic pain management; Encounter for therapeutic drug level monitoring; Chronic lower extremity pain (Location of Secondary source of pain) (Bilateral) (R>L); Chronic lumbar radicular pain (Right) (L5 Dermatome); Lumbar Levoscoliosis; and Chronic pain on his problem list.. His primarily concern today is the Back Pain   The patient returns to the clinics today for pharmacological management of his chronic pain and post procedure evaluation. On 10/08/2015 he had a left-sided L4-5 palliative lumbar epidural steroid injection under fluoroscopic guidance and IV sedation. Today he comes in indicating that his worst pain is in the lower back and that this back pain is worse than the right side.  He indicates that his second worst pain is the lower extremity pain which again he has pain in both legs but in the case of the right leg goes all the way down to the top of the foot and into the big toe. In the case of the left leg it goes down to the thigh but he refers that this may be secondary to some intramedullary nailing that he had done in the past.  He indicates that about 3 days ago he ran out of pain medicine and he has not taken the MS Contin 30 mg every 12 hours or that oxycodone/APAP 10/325 twice a day. I asked him if he was experiencing any type of withdrawals and he indicated that he was not. He also did not look very distressed and because of this I asked him if he wouldn't mind if we started going down on the pain medicine. He actually got excited that we would since he indicates is wanting to do that. Today we will discontinue the EMS Contin 30 mg every 12 and allow him to take the oxycodone/APAP 10/325 up to 4 times a day. The next step will be to switch the Percocet for plain oxycodone 10 and from there will started going down from 10 mg 4 times a day 2 3 times a day.  Reported Pain Score: 6 , clinically he looks like a 1-2/10. Reported level is inconsistent with clinical obrservations. Pain Type: Chronic pain Pain Location: Back Pain Orientation: Lower, Right, Left Pain Descriptors / Indicators: Aching, Radiating Pain Frequency: Constant  Date of Last Visit: 10/08/15 Service Provided on Last Visit: Procedure (LESI)  Pharmacotherapy  Medication(s): Previously he was taking MS Contin 30 mg twice a day plus Percocet 10/325 by mouth twice a day. He indicates not taking any in the past 3 days and he really does not look in much  distress. Onset of action: Within expected pharmacological parameters Time to Peak effect: Timing and results are as within normal expected parameters Analgesic Effect: More than 50% Activity Facilitation: Medication(s) allow patient to sit, stand, walk,  and do the basic ADLs Perceived Effectiveness: Described as relatively effective, allowing for increase in activities of daily living (ADL) Side-effects or Adverse reactions: None reported Duration of action: Within normal limits for medication [place] PMP: Compliant with practice rules and regulations UDS Results: The patient's last UDS was done on 10/07/2015 and it came back within normal limits with no unexpected results. UDS Interpretation: Patient appears to be compliant with practice rules and regulations Medication Assessment Form: Reviewed. Patient indicates being compliant with therapy Treatment compliance: Compliant Substance Use Disorder (SUD) Risk Level: Low Pharmacologic Plan: Continue therapy as is  Lab Work: Illicit Drugs No results found for: THCU, COCAINSCRNUR, PCPSCRNUR, MDMA, AMPHETMU, METHADONE, ETOH  Inflammation Markers No results found for: ESRSEDRATE, CRP  Renal Function No results found for: BUN, CREATININE, GFRAA, GFRNONAA  Hepatic Function No results found for: AST, ALT, ALBUMIN  Electrolytes No results found for: NA, K, CL, CALCIUM, MG  Post-Procedure Assessment  Procedure done on last visit: Left, palliative, L4-5 lumbar epidural steroid injection under fluoroscopic guidance, without sedation. Side-effects or Adverse reactions: None reported Sedation: No sedation used during procedure  Results: Ultra-Short Term Relief (First 1 hour after procedure): 100 %  Possibly the results is influenced by the pharmacodynamic effect of the local anesthetic, as well as that of the intravenous analgesics and/or sedatives, when used Short Term Relief (Initial 4-6 hrs after procedure): 100 % Short-term relief confirms injected site to be the source of pain Long Term Relief : 75 % (the pain is back but it isnt as bad.) Long-term benefit would suggest an inflammatory etiology to the pain   Current Relief (Now):  75% , for almost 2 months. Persistent relief would suggest  effective anti-inflammatory effects from steroids Interpretation of Results: Clearly this type of procedures is to be useful in maintaining his pain down, which may allow [place] to go further down on his pain medicine and completely stop it.  Allergies  Mr. Poer has No Known Allergies.  Meds  The patient has a current medication list which includes the following prescription(s): albuterol, aspirin ec, atorvastatin, vitamin d3, gabapentin, glucose blood, hydroxyzine, ibuprofen-diphenhydramine cit, insulin glargine, insulin lispro, insulin pen needle, lisinopril, metformin, nitroglycerin, omeprazole, oxycodone-acetaminophen, prednisone, and sertraline.  Current Outpatient Prescriptions on File Prior to Visit  Medication Sig  . albuterol (PROAIR HFA) 108 (90 Base) MCG/ACT inhaler INHALE 1-2 PUFFS BY MOUTH AS NEEDED  . aspirin EC 81 MG tablet Take 81 mg by mouth daily.  Marland Kitchen atorvastatin (LIPITOR) 40 MG tablet Take 40 mg by mouth daily.  . Cholecalciferol (VITAMIN D3) 5000 units TABS Take 1 tablet by mouth daily.   Marland Kitchen gabapentin (NEURONTIN) 300 MG capsule Take 2 capsules (600 mg total) by mouth 3 (three) times daily. (Patient taking differently: Take 300 mg by mouth. 1 in the am, 2 at noon and 3 at bedtime)  . glucose blood (ACCU-CHEK ACTIVE STRIPS) test strip Checks 3 times daily  . hydrOXYzine (VISTARIL) 25 MG capsule Take 50 mg by mouth.  . Ibuprofen-Diphenhydramine Cit (ADVIL PM PO) Take 1 tablet by mouth at bedtime.  . insulin glargine (LANTUS) 100 UNIT/ML injection Inject 52 Units into the skin at bedtime.   . insulin lispro (HUMALOG) 100 UNIT/ML KiwkPen Inject 18 Units into the skin 2 (two) times daily.   Marland Kitchen  Insulin Pen Needle (B-D ULTRAFINE III SHORT PEN) 31G X 8 MM MISC Reported on 10/07/2015  . lisinopril (PRINIVIL,ZESTRIL) 5 MG tablet Take 5 mg by mouth daily.  . metFORMIN (GLUCOPHAGE) 500 MG tablet Take 1,000 mg by mouth 2 (two) times daily with a meal.  . nitroGLYCERIN (NITROSTAT) 0.4 MG SL  tablet Place 0.4 mg under the tongue every 5 (five) minutes as needed for chest pain.  Marland Kitchen omeprazole (PRILOSEC) 20 MG capsule Take 20 mg by mouth daily.  . predniSONE (DELTASONE) 5 MG tablet Take 5 mg by mouth daily with breakfast.   . sertraline (ZOLOFT) 50 MG tablet Take 50 mg by mouth daily.   No current facility-administered medications on file prior to visit.    ROS  Constitutional: Afebrile, no chills, well hydrated and well nourished Gastrointestinal: negative Musculoskeletal:negative Neurological: negative Behavioral/Psych: negative  PFSH  Medical:  Mr. Pfeifer  has a past medical history of Diabetes mellitus without complication (HCC); Hyperlipidemia; Hypertension; Chronic back pain; Sarcoidosis (HCC); Right leg pain (07/15/2015); and Left leg pain (01/02/2014). Family: family history includes Diabetes in his father; Heart disease in his father and mother; Stroke in his father. Surgical:  has past surgical history that includes Appendectomy; Tonsillectomy; and left rod. Tobacco:  reports that he has never smoked. He has never used smokeless tobacco. Alcohol:  reports that he does not drink alcohol. Drug:  reports that he does not use illicit drugs.  Physical Exam  Vitals:  Today's Vitals   11/19/15 1410 11/19/15 1411  BP:  151/93  Pulse: 72   Temp: 98 F (36.7 C)   Resp: 17   Height: 5\' 8"  (1.727 m)   Weight: 180 lb (81.647 kg)   SpO2: 100%   PainSc: 6  6   PainLoc: Back     Calculated BMI: Body mass index is 27.38 kg/(m^2).  General appearance: alert, cooperative, appears stated age and no distress Eyes: PERLA Respiratory: No evidence respiratory distress, no audible rales or ronchi and no use of accessory muscles of respiration  Cervical Spine Inspection: Normal anatomy Alignment: Symetrical ROM: Adequate  Upper Extremities Inspection: No gross anomalies detected ROM: Adequate Sensory: Normal Motor: Unremarkable  Thoracic Spine Inspection: No gross  anomalies detected Alignment: Symetrical ROM: Adequate Palpation: WNL  Lumbar Spine Inspection: No gross anomalies detected Alignment: Symetrical ROM: Decreased Palpation: Tender Provocative Tests:  Lumbar Hyperextension and rotation test:  deferred Patrick's Maneuver: deferred Gait: WNL  Lower Extremities Inspection: No gross anomalies detected ROM: Adequate Sensory:  Normal Motor: Unremarkable  Toe walk (S1): WNL  Heal walk (L5): WNL  Assessment & Plan  Primary Diagnosis & Pertinent Problem List: The primary encounter diagnosis was Chronic pain. Diagnoses of Chronic lumbar radicular pain (Left) (L5), Chronic pain of lower extremity, left, Encounter for therapeutic drug level monitoring, Long term current use of systemic steroids, and Chronic pain syndrome were also pertinent to this visit.  Visit Diagnosis: 1. Chronic pain   2. Chronic lumbar radicular pain (Left) (L5)   3. Chronic pain of lower extremity, left   4. Encounter for therapeutic drug level monitoring   5. Long term current use of systemic steroids   6. Chronic pain syndrome     Assessment: No problem-specific assessment & plan notes found for this encounter.   Plan of Care  Pharmacotherapy (Medications Ordered): Meds ordered this encounter  Medications  . oxyCODONE-acetaminophen (PERCOCET) 10-325 MG tablet    Sig: Take 1 tablet by mouth every 6 (six) hours as needed for pain.  Dispense:  120 tablet    Refill:  0    Do not place this medication, or any other prescription from our practice, on "Automatic Refill". Patient may have prescription filled one day early if pharmacy is closed on scheduled refill date. Do not fill until: 11/19/15 To last until: 12/19/15    [facility] & Procedure Ordered: Orders Placed This Encounter  Procedures  . LUMBAR EPIDURAL STEROID INJECTION    Standing Status: Future     Number of Occurrences:      Standing Expiration Date: 11/18/2016    Scheduling Instructions:      Side: Left-sided (L4-5)     Sedation: No Sedation.     Timeframe: ASAP    Order Specific Question:  Where will this procedure be performed?    Answer:  [facility]  . MR Lumbar Spine Wo Contrast    Standing Status: Future     Number of Occurrences:      Standing Expiration Date: 11/18/2016    Scheduling Instructions:     Please provide canal diameter in millimeters when describing any spinal stenosis.    Order Specific Question:  Reason for Exam (SYMPTOM  OR DIAGNOSIS REQUIRED)    Answer:  Lumbar radiculopathy/radiculitis    Order Specific Question:  Preferred imaging location?    Answer:  [facility]    Order Specific Question:  Does the patient have a pacemaker or implanted devices?    Answer:  No    Order Specific Question:  What is the patient's sedation requirement?    Answer:  No Sedation    Order Specific Question:  Call Results- Best Contact Number?    Answer:  [phone] (Pain Clinic facility) (Dr. Laban Emperor)    Imaging Ordered: MR LUMBAR SPINE WO CONTRAST  Interventional Therapies: Scheduled: Palliative lumbar epidural steroid injection under fluoroscopic guidance, no sedation. PRN Procedures: Possible diagnostic bilateral lumbar facet block under fluoroscopic guidance and IV sedation.    Referral(s) or Consult(s): None at this time.  Medications administered during this visit: Mr. Shawgo had no medications administered during this visit.  Future Appointments Date Time Provider Department Center  11/26/2015 9:20 AM Delano Metz, MD [facility] None    Primary Care Physician: No primary care provider on file. Location: [facility] Note by: Heavan Francom A. Laban Emperor, M.D, DABA, DABAPM, DABPM, DABIPP, FIPP

## 2015-11-26 ENCOUNTER — Ambulatory Visit: Payer: Medicare Other | Attending: Pain Medicine | Admitting: Pain Medicine

## 2015-11-26 ENCOUNTER — Encounter: Payer: Self-pay | Admitting: Pain Medicine

## 2015-11-26 VITALS — BP 176/108 | HR 82 | Temp 98.0°F | Resp 20 | Ht 68.0 in | Wt 180.0 lb

## 2015-11-26 DIAGNOSIS — M545 Low back pain: Secondary | ICD-10-CM | POA: Insufficient documentation

## 2015-11-26 DIAGNOSIS — G510 Bell's palsy: Secondary | ICD-10-CM | POA: Insufficient documentation

## 2015-11-26 DIAGNOSIS — G8929 Other chronic pain: Secondary | ICD-10-CM | POA: Diagnosis not present

## 2015-11-26 DIAGNOSIS — F411 Generalized anxiety disorder: Secondary | ICD-10-CM | POA: Diagnosis not present

## 2015-11-26 DIAGNOSIS — M7060 Trochanteric bursitis, unspecified hip: Secondary | ICD-10-CM | POA: Diagnosis not present

## 2015-11-26 DIAGNOSIS — E663 Overweight: Secondary | ICD-10-CM | POA: Insufficient documentation

## 2015-11-26 DIAGNOSIS — E559 Vitamin D deficiency, unspecified: Secondary | ICD-10-CM | POA: Insufficient documentation

## 2015-11-26 DIAGNOSIS — M25561 Pain in right knee: Secondary | ICD-10-CM | POA: Insufficient documentation

## 2015-11-26 DIAGNOSIS — M25562 Pain in left knee: Secondary | ICD-10-CM | POA: Diagnosis not present

## 2015-11-26 DIAGNOSIS — E119 Type 2 diabetes mellitus without complications: Secondary | ICD-10-CM | POA: Diagnosis not present

## 2015-11-26 DIAGNOSIS — M549 Dorsalgia, unspecified: Secondary | ICD-10-CM | POA: Diagnosis present

## 2015-11-26 DIAGNOSIS — M7550 Bursitis of unspecified shoulder: Secondary | ICD-10-CM | POA: Diagnosis not present

## 2015-11-26 DIAGNOSIS — M47896 Other spondylosis, lumbar region: Secondary | ICD-10-CM | POA: Diagnosis not present

## 2015-11-26 DIAGNOSIS — F329 Major depressive disorder, single episode, unspecified: Secondary | ICD-10-CM | POA: Insufficient documentation

## 2015-11-26 DIAGNOSIS — K5900 Constipation, unspecified: Secondary | ICD-10-CM | POA: Diagnosis not present

## 2015-11-26 DIAGNOSIS — I10 Essential (primary) hypertension: Secondary | ICD-10-CM | POA: Diagnosis not present

## 2015-11-26 DIAGNOSIS — M5116 Intervertebral disc disorders with radiculopathy, lumbar region: Secondary | ICD-10-CM | POA: Diagnosis not present

## 2015-11-26 DIAGNOSIS — D860 Sarcoidosis of lung: Secondary | ICD-10-CM | POA: Insufficient documentation

## 2015-11-26 DIAGNOSIS — F1190 Opioid use, unspecified, uncomplicated: Secondary | ICD-10-CM | POA: Diagnosis not present

## 2015-11-26 DIAGNOSIS — M5416 Radiculopathy, lumbar region: Secondary | ICD-10-CM

## 2015-11-26 DIAGNOSIS — L309 Dermatitis, unspecified: Secondary | ICD-10-CM | POA: Diagnosis not present

## 2015-11-26 DIAGNOSIS — K7460 Unspecified cirrhosis of liver: Secondary | ICD-10-CM | POA: Diagnosis not present

## 2015-11-26 DIAGNOSIS — M4726 Other spondylosis with radiculopathy, lumbar region: Secondary | ICD-10-CM | POA: Diagnosis not present

## 2015-11-26 DIAGNOSIS — M419 Scoliosis, unspecified: Secondary | ICD-10-CM | POA: Diagnosis not present

## 2015-11-26 LAB — TOXASSURE SELECT 13 (MW), URINE: PDF: 0

## 2015-11-26 MED ORDER — TRIAMCINOLONE ACETONIDE 40 MG/ML IJ SUSP
40.0000 mg | Freq: Once | INTRAMUSCULAR | Status: AC
  Administered 2015-11-26: 10:00:00

## 2015-11-26 MED ORDER — LIDOCAINE HCL (PF) 1 % IJ SOLN
INTRAMUSCULAR | Status: AC
  Filled 2015-11-26: qty 5

## 2015-11-26 MED ORDER — SODIUM CHLORIDE 0.9 % IJ SOLN
INTRAMUSCULAR | Status: AC
  Administered 2015-11-26: 10:00:00
  Filled 2015-11-26: qty 10

## 2015-11-26 MED ORDER — SODIUM CHLORIDE 0.9% FLUSH: 2.0000 mL | Freq: Once | INTRAVENOUS | Status: DC

## 2015-11-26 MED ORDER — IOHEXOL 180 MG/ML  SOLN
INTRAMUSCULAR | Status: AC
  Filled 2015-11-26: qty 20

## 2015-11-26 MED ORDER — ROPIVACAINE HCL 2 MG/ML IJ SOLN
INTRAMUSCULAR | Status: AC
  Filled 2015-11-26: qty 10

## 2015-11-26 MED ORDER — TRIAMCINOLONE ACETONIDE 40 MG/ML IJ SUSP
INTRAMUSCULAR | Status: AC
  Filled 2015-11-26: qty 1

## 2015-11-26 MED ORDER — ROPIVACAINE HCL 2 MG/ML IJ SOLN
2.0000 mL | Freq: Once | INTRAMUSCULAR | Status: AC
  Administered 2015-11-26: 10:00:00 via EPIDURAL

## 2015-11-26 MED ORDER — LIDOCAINE HCL (PF) 1 % IJ SOLN
10.0000 mL | Freq: Once | INTRAMUSCULAR | Status: AC
Start: 2015-11-26 — End: 2015-11-26
  Administered 2015-11-26: 10:00:00

## 2015-11-26 MED ORDER — IOHEXOL 180 MG/ML  SOLN
10.0000 mL | Freq: Once | INTRAMUSCULAR | Status: AC | PRN
  Administered 2015-11-26: 10:00:00 via EPIDURAL

## 2015-11-26 NOTE — Patient Instructions (Signed)
Epidural Steroid Injection Patient Information  Description: The epidural space surrounds the nerves as they exit the spinal cord.  In some patients, the nerves can be compressed and inflamed by a bulging disc or a tight spinal canal (spinal stenosis).  By injecting steroids into the epidural space, we can bring irritated nerves into direct contact with a potentially helpful medication.  These steroids act directly on the irritated nerves and can reduce swelling and inflammation which often leads to decreased pain.  Epidural steroids may be injected anywhere along the spine and from the neck to the low back depending upon the location of your pain.   After numbing the skin with local anesthetic (like Novocaine), a small needle is passed into the epidural space slowly.  You may experience a sensation of pressure while this is being done.  The entire block usually last less than 10 minutes.  Conditions which may be treated by epidural steroids:   Low back and leg pain  Neck and arm pain  Spinal stenosis  Post-laminectomy syndrome  Herpes zoster (shingles) pain  Pain from compression fractures  Preparation for the injection:  1. Do not eat any solid food or dairy products within 6 hours of your appointment.  2. You may drink clear liquids up to 2 hours before appointment.  Clear liquids include water, black coffee, juice or soda.  No milk or cream please. 3. You may take your regular medication, including pain medications, with a sip of water before your appointment  Diabetics should hold regular insulin (if taken separately) and take 1/2 normal NPH dos the morning of the procedure.  Carry some sugar containing items with you to your appointment. 4. A driver must accompany you and be prepared to drive you home after your procedure.  5. Bring all your current medications with your. 6. An IV may be inserted and sedation may be given at the discretion of the physician.   7. A blood pressure  cuff, EKG and other monitors will often be applied during the procedure.  Some patients may need to have extra oxygen administered for a short period. 8. You will be asked to provide medical information, including your allergies, prior to the procedure.  We must know immediately if you are taking blood thinners (like Coumadin/Warfarin)  Or if you are allergic to IV iodine contrast (dye). We must know if you could possible be pregnant.  Possible side-effects:  Bleeding from needle site  Infection (rare, may require surgery)  Nerve injury (rare)  Numbness & tingling (temporary)  Difficulty urinating (rare, temporary)  Spinal headache ( a headache worse with upright posture)  Light -headedness (temporary)  Pain at injection site (several days)  Decreased blood pressure (temporary)  Weakness in arm/leg (temporary)  Pressure sensation in back/neck (temporary)  Call if you experience:  Fever/chills associated with headache or increased back/neck pain.  Headache worsened by an upright position.  New onset weakness or numbness of an extremity below the injection site  Hives or difficulty breathing (go to the emergency room)  Inflammation or drainage at the infection site  Severe back/neck pain  Any new symptoms which are concerning to you  Please note:  Although the local anesthetic injected can often make your back or neck feel good for several hours after the injection, the pain will likely return.  It takes 3-7 days for steroids to work in the epidural space.  You may not notice any pain relief for at least that one week.    If effective, we will often do a series of three injections spaced 3-6 weeks apart to maximally decrease your pain.  After the initial series, we generally will wait several months before considering a repeat injection of the same type.  If you have any questions, please call [phone] [facility]  Discharge Instructions  General Discharge Instructions :  If you need to reach your doctor call: Monday-Friday 8:00 am - 4:00 pm at [phone] or toll free [phone].  After clinic hours [phone] to have operator reach doctor.  Bring all of your medication bottles to all your appointments in the pain clinic.  To cancel or reschedule your appointment with Pain Management please remember to call 24 hours in advance to avoid a fee.  Refer to the educational materials which you have been given on: General Risks, I had my Procedure. Discharge Instructions, Post Sedation.  Post Procedure Instructions:  The drugs you were given will stay in your system until tomorrow, so for the next 24 hours you should not drive, make any legal decisions or drink any alcoholic beverages.  You may eat anything you prefer, but it is better to start with liquids then soups and crackers, and gradually work up to solid foods.  Please notify your doctor immediately if you have any unusual bleeding, trouble breathing or pain that is not related to your normal pain.  Depending on the type of procedure that was done, some parts of your body may feel week and/or numb.  This usually clears up by tonight or the next day.  Walk with the use of an assistive device or accompanied by an adult for the 24 hours.  You may use ice on the affected area for the first 24 hours.  Put ice in a Ziploc bag and cover with a towel and place against area 15 minutes on 15 minutes off.  You may switch to heat after 24 hours. 

## 2015-11-26 NOTE — Progress Notes (Signed)
Safety precautions to be maintained throughout the outpatient stay will include: orient to surroundings, keep bed in low position, maintain call bell within reach at all times, provide assistance with transfer out of bed and ambulation.  

## 2015-11-26 NOTE — Progress Notes (Signed)
Patient's Name: Douglas Andrews MRN: [id number] DOB: 1951/07/30 DOS: 11/26/2015  Primary Reason(s) for Visit: Interventional Pain Management Treatment. CC: Back Pain   Pre-Procedure Assessment:  Douglas Andrews is a 65 y.o. year old, male patient, seen today for interventional treatment. He has Chronic low back pain (Location of Primary Source of Pain) (Bilateral) (R>L); Chronic pain syndrome;  Lumbar DDD (degenerative disc disease);  Lumbar Spondylosis; Lumbar radicular pain (Right) (L5 Dermatome); Lumbar facet syndrome (Bilateral) (R>L); Opiate use; Uncomplicated opioid dependence ([place]); Long term prescription opiate use; Chronic knee pain (Bilateral) (L>R); Vitamin D insufficiency; Overweight; Bell palsy; CN (constipation); Dermatitis, eczematoid; Diabetes mellitus ([place]); Dysmetria; Essential (primary) hypertension; Anxiety, generalized; Insomnia due to medical condition; Long term current use of systemic steroids; Type 2 diabetes mellitus ([place]); Pulmonary sarcoidosis ([place]); Arthropathy, traumatic, shoulder; Bursitis, trochanteric; Esophageal varices ([place]); Leg varices; Hepatic cirrhosis ([place]); Major depressive disorder with single episode ([place]); Other long term (current) drug therapy; At risk for falling; Encounter for chronic pain management; Encounter for therapeutic drug level monitoring; Chronic lower extremity pain (Location of Secondary source of pain) (Bilateral) (R>L); Chronic lumbar radicular pain (Right) (L5 Dermatome); Lumbar Levoscoliosis; and Chronic pain on his problem list.. His primarily concern today is the Back Pain   Today's Initial Pain Score: 6/10 Reported level of pain is incompatible with clinical obrservations. This may be secondary to a possible lack of understanding on how the pain scale works. Pain Type: Chronic pain Pain Location: Back Pain Orientation: Lower, Left Pain Descriptors / Indicators: Aching, Radiating Pain Frequency: Constant  Post-procedure Pain Score: 6    Date of Last Visit: 11/19/15 Service Provided on Last Visit: Med Refill, Evaluation  Verification of the correct person, correct site (including marking of site), and correct procedure were performed and confirmed by the patient.  Today's Vitals   11/26/15 0947 11/26/15 0948 11/26/15 0952 11/26/15 0955  BP: 162/133 170/105 144/109 176/108  Pulse: 81 83 82   Temp:      Resp: '23 16 20   '$ Height:      Weight:      SpO2: 99% 99% 98%   PainSc:      PainLoc:      Calculated BMI: Body mass index is 27.38 kg/(m^2). Allergies: He has No Known Allergies.. Primary Diagnosis: Chronic radicular lumbar pain [M54.16, G89.29]  Procedure:  Type: Palliative Inter-Laminar Epidural Steroid Injection Region: Lumbar Level: L4-5 Level. Laterality: Left Paramedial  Indications: 1. Chronic lumbar radicular pain (Right) (L5 Dermatome)   2. Lumbar radicular pain (Right) (L5 Dermatome)   3.  Lumbar Spondylosis     In addition, Douglas Andrews has Chronic low back pain (Location of Primary Source of Pain) (Bilateral) (R>L); Chronic pain syndrome;  Lumbar DDD (degenerative disc disease);  Lumbar Spondylosis; Lumbar radicular pain (Right) (L5 Dermatome); Lumbar facet syndrome (Bilateral) (R>L); Chronic knee pain (Bilateral) (L>R); Arthropathy, traumatic, shoulder; Bursitis, trochanteric; Chronic lower extremity pain (Location of Secondary source of pain) (Bilateral) (R>L); Chronic lumbar radicular pain (Right) (L5 Dermatome); Lumbar Levoscoliosis; and Chronic pain on his pertinent problem list.  Consent: Secured. Under the influence of no sedatives a written informed consent was obtained, after having provided information on the risks and possible complications. To fulfill our ethical and legal obligations, as recommended by the American Medical Association's Code of Ethics, we have provided information to the patient about our clinical impression; the nature and purpose of the treatment or procedure; the risks,  benefits, and possible complications of the intervention; alternatives; the risk(s) and benefit(s) of the  alternative treatment(s) or procedure(s); and the risk(s) and benefit(s) of doing nothing. The patient was provided information about the risks and possible complications associated with the procedure. In the case of spinal procedures these may include, but are not limited to, failure to achieve desired goals, infection, bleeding, organ or nerve damage, allergic reactions, paralysis, and death. In addition, the patient was informed that Medicine is not an exact science; therefore, there is also the possibility of unforeseen risks and possible complications that may result in a catastrophic outcome. The patient indicated having understood very clearly. We have given the patient no guarantees and we have made no promises. Enough time was given to the patient to ask questions, all of which were answered to the patient's satisfaction.  Pre-Procedure Preparation: Safety Precautions: Allergies reviewed. Appropriate site, procedure, and patient were confirmed by following the Joint Commission's Universal Protocol (UP.01.01.01), in the form of a "Time Out". The patient was asked to confirm marked site and procedure, before commencing. The patient was asked about blood thinners, or active infections, both of which were denied. Patient was assessed for positional comfort and all pressure points were checked before starting procedure. Monitoring:  As per clinic protocol. Infection Control Precautions: Sterile technique used. Standard Universal Precautions were taken as recommended by the Department of [facility] for Disease Control and Prevention (CDC). Standard pre-surgical skin prep was conducted. Respiratory hygiene and cough etiquette was practiced. Hand hygiene observed. Safe injection practices and needle disposal techniques followed. SDV (single dose vial) medications used. Medications properly  checked for expiration dates and contaminants. Personal protective equipment (PPE) used: Surgical mask. Sterile double glove technique. Radiation resistant gloves. Sterile surgical gloves.  Anesthesia, Analgesia, Anxiolysis: Type: Local Anesthesia Local Anesthetic: Lidocaine 1% Route: Infiltration ([place]/IM) IV Access: Declined Sedation: Declined  Indication(s): Analgesia    Description of Procedure Process:  Time-out: "Time-out" completed before starting procedure, as per protocol. Position: Prone Target Area: For Epidural Steroid injections, the target area is the  interlaminar space, initially targeting the lower border of the superior vertebral body lamina. Approach: Posterior approach. Area Prepped: Entire Posterior Lumbosacral Region Prepping solution: ChloraPrep (2% chlorhexidine gluconate and 70% isopropyl alcohol) Safety Precautions: Aspiration looking for blood return was conducted prior to all injections. At no point did we inject any substances, as a needle was being advanced. No attempts were made at seeking any paresthesias. Safe injection practices and needle disposal techniques used. Medications properly checked for expiration dates. SDV (single dose vial) medications used.   Description of the Procedure: Protocol guidelines were followed. The patient was placed in position over the fluoroscopy table. The target area was identified and the area prepped in the usual manner. Skin desensitized using vapocoolant spray. Skin & deeper tissues infiltrated with local anesthetic. Appropriate amount of time allowed to pass for local anesthetics to take effect. The procedure needle was introduced through the skin, ipsilateral to the reported pain, and advanced to the target area. Bone was contacted and the needle walked caudad, until the lamina was cleared. The epidural space was identified using "loss-of-resistance technique" with 2-3 ml of PF-NaCl (0.9% NSS), in a 5cc LOR glass syringe. Proper  needle placement secured. Negative aspiration confirmed. Solution injected in intermittent fashion, asking for systemic symptoms every 0.5cc of injectate. The needles were then removed and the area cleansed, making sure to leave some of the prepping solution back to take advantage of its long term bactericidal properties. EBL: None Materials & Medications Used:  Needle(s) Used: 20g - 10cm,  Tuohy-style epidural needle Medication(s): Please see chart orders for medication and dosing details.  Imaging Guidance:  Type of Imaging Technique: Fluoroscopy Guidance (Spinal) Indication(s): Assistance in needle guidance and placement for procedures requiring needle placement in or near specific anatomical locations not easily accessible without such assistance. Exposure Time: Please see nurses notes. Contrast: Before injecting any contrast, we confirmed that the patient did not have an allergy to iodine, shellfish, or radiological contrast. Once satisfactory needle placement was completed at the desired level, radiological contrast was injected. Injection was conducted under continuous fluoroscopic guidance. Injection of contrast accomplished without complications. See chart for type and volume of contrast used. Fluoroscopic Guidance: I was personally present in the fluoroscopy suite, where the patient was placed in position for the procedure, over the fluoroscopy-compatible table. Fluoroscopy was manipulated, using "Tunnel Vision Technique", to obtain the best possible view of the target area, on the affected side. Parallax error was corrected before commencing the procedure. A "direction-depth-direction" technique was used to introduce the needle under continuous pulsed fluoroscopic guidance. Once the target was reached, antero-posterior, oblique, and lateral fluoroscopic projection views were taken to confirm needle placement in all planes. Permanently recorded images stored by scanning into EMR. Interpretation:  Intraoperative imaging interpretation by performing Physician. Adequate needle placement confirmed. Adequate needle placement confirmed in AP, lateral, & Oblique Views. Appropriate spread of contrast to desired area. No evidence of afferent or efferent intravascular uptake. No intrathecal or subarachnoid spread observed. Permanent hardcopy images in multiple planes scanned into the patient's record.  Antibiotics:  Type:  Antibiotics Given (last 72 hours)    None      Indication(s): No indications identified.  Post-operative Assessment:  Complications: No immediate post-treatment complications were observed. Relevant Post-operative Information:  Disposition: Return to clinic for follow-up evaluation. The patient tolerated the entire procedure well. A repeat set of vitals were taken after the procedure and the patient was kept under observation following institutional policy, for this procedure. Post-procedural neurological assessment was performed, showing return to baseline, prior to discharge. The patient was discharged home, once institutional criteria were met. The patient was provided with post-procedure discharge instructions, including a section on how to identify potential problems. Should any problems arise concerning this procedure, the patient was given instructions to immediately contact us, at any time, without hesitation. In any case, we plan to contact the patient by telephone for a follow-up status report regarding this interventional procedure. Comments:  No additional relevant information.  Medications administered during this visit: We administered lidocaine (PF), sodium chloride, ropivacaine (PF) 2 mg/ml (0.2%), triamcinolone acetonide, iohexol, iohexol, lidocaine (PF), ropivacaine (PF) 2 mg/ml (0.2%), and triamcinolone acetonide.  Future Appointments Date Time Provider [place]  12/16/2015 2:00 PM Milinda Pointer, MD [facility] None    Primary Care Physician: No  primary care provider on file. Location: [facility] Note by: Tashawn Greff A. Dossie Arbour, M.D, DABA, DABAPM, DABPM, DABIPP, FIPP  Disclaimer:  Medicine is not an exact science. The only guarantee in medicine is that nothing is guaranteed. It is important to note that the decision to proceed with this intervention was based on the information collected from the patient. The Data and conclusions were drawn from the patient's questionnaire, the interview, and the physical examination. Because the information was provided in large part by the patient, it cannot be guaranteed that it has not been purposely or unconsciously manipulated. Every effort has been made to obtain as much relevant data as possible for this evaluation. It is important  to note that the conclusions that lead to this procedure are derived in large part from the available data. Always take into account that the treatment will also be dependent on availability of resources and existing treatment guidelines, considered by other Pain Management Practitioners as being common knowledge and practice, at the time of the intervention. For Medico-Legal purposes, it is also important to point out that variation in procedural techniques and pharmacological choices are the acceptable norm. The indications, contraindications, technique, and results of the above procedure should only be interpreted and judged by a Board-Certified Interventional Pain Specialist with extensive familiarity and expertise in the same exact procedure and technique. Attempts at providing opinions without similar or greater experience and expertise than that of the treating physician will be considered as inappropriate and unethical, and shall result in a formal complaint to the state medical board and applicable specialty societies.

## 2015-11-27 ENCOUNTER — Telehealth: Payer: Self-pay

## 2015-11-27 NOTE — Telephone Encounter (Signed)
Pt states he had a headache after procedure but has gone away- denies any further needs

## 2015-12-16 ENCOUNTER — Ambulatory Visit: Payer: Medicare Other | Admitting: Pain Medicine

## 2015-12-30 ENCOUNTER — Ambulatory Visit: Payer: Medicare Other | Attending: Pain Medicine | Admitting: Pain Medicine

## 2015-12-30 ENCOUNTER — Encounter: Payer: Self-pay | Admitting: Pain Medicine

## 2015-12-30 VITALS — BP 141/89 | HR 83 | Temp 98.9°F | Resp 16 | Ht 68.0 in | Wt 180.0 lb

## 2015-12-30 DIAGNOSIS — M25561 Pain in right knee: Secondary | ICD-10-CM | POA: Insufficient documentation

## 2015-12-30 DIAGNOSIS — Z7952 Long term (current) use of systemic steroids: Secondary | ICD-10-CM

## 2015-12-30 DIAGNOSIS — M5136 Other intervertebral disc degeneration, lumbar region: Secondary | ICD-10-CM | POA: Insufficient documentation

## 2015-12-30 DIAGNOSIS — M792 Neuralgia and neuritis, unspecified: Secondary | ICD-10-CM

## 2015-12-30 DIAGNOSIS — F329 Major depressive disorder, single episode, unspecified: Secondary | ICD-10-CM | POA: Insufficient documentation

## 2015-12-30 DIAGNOSIS — G510 Bell's palsy: Secondary | ICD-10-CM | POA: Diagnosis not present

## 2015-12-30 DIAGNOSIS — Z9181 History of falling: Secondary | ICD-10-CM

## 2015-12-30 DIAGNOSIS — D869 Sarcoidosis, unspecified: Secondary | ICD-10-CM | POA: Insufficient documentation

## 2015-12-30 DIAGNOSIS — F411 Generalized anxiety disorder: Secondary | ICD-10-CM | POA: Insufficient documentation

## 2015-12-30 DIAGNOSIS — M1120 Other chondrocalcinosis, unspecified site: Secondary | ICD-10-CM | POA: Diagnosis not present

## 2015-12-30 DIAGNOSIS — L309 Dermatitis, unspecified: Secondary | ICD-10-CM | POA: Insufficient documentation

## 2015-12-30 DIAGNOSIS — G8929 Other chronic pain: Secondary | ICD-10-CM | POA: Diagnosis not present

## 2015-12-30 DIAGNOSIS — Z5181 Encounter for therapeutic drug level monitoring: Secondary | ICD-10-CM

## 2015-12-30 DIAGNOSIS — M129 Arthropathy, unspecified: Secondary | ICD-10-CM | POA: Diagnosis not present

## 2015-12-30 DIAGNOSIS — M79606 Pain in leg, unspecified: Secondary | ICD-10-CM | POA: Insufficient documentation

## 2015-12-30 DIAGNOSIS — E119 Type 2 diabetes mellitus without complications: Secondary | ICD-10-CM | POA: Diagnosis not present

## 2015-12-30 DIAGNOSIS — E559 Vitamin D deficiency, unspecified: Secondary | ICD-10-CM | POA: Diagnosis not present

## 2015-12-30 DIAGNOSIS — M549 Dorsalgia, unspecified: Secondary | ICD-10-CM | POA: Diagnosis present

## 2015-12-30 DIAGNOSIS — K7460 Unspecified cirrhosis of liver: Secondary | ICD-10-CM | POA: Insufficient documentation

## 2015-12-30 DIAGNOSIS — I8500 Esophageal varices without bleeding: Secondary | ICD-10-CM | POA: Insufficient documentation

## 2015-12-30 DIAGNOSIS — M25562 Pain in left knee: Secondary | ICD-10-CM | POA: Diagnosis not present

## 2015-12-30 DIAGNOSIS — E663 Overweight: Secondary | ICD-10-CM | POA: Insufficient documentation

## 2015-12-30 DIAGNOSIS — I8390 Asymptomatic varicose veins of unspecified lower extremity: Secondary | ICD-10-CM | POA: Diagnosis not present

## 2015-12-30 DIAGNOSIS — G4700 Insomnia, unspecified: Secondary | ICD-10-CM | POA: Diagnosis not present

## 2015-12-30 DIAGNOSIS — M545 Low back pain: Secondary | ICD-10-CM | POA: Insufficient documentation

## 2015-12-30 DIAGNOSIS — K5900 Constipation, unspecified: Secondary | ICD-10-CM | POA: Insufficient documentation

## 2015-12-30 DIAGNOSIS — M1711 Unilateral primary osteoarthritis, right knee: Secondary | ICD-10-CM

## 2015-12-30 DIAGNOSIS — R296 Repeated falls: Secondary | ICD-10-CM

## 2015-12-30 DIAGNOSIS — M7550 Bursitis of unspecified shoulder: Secondary | ICD-10-CM | POA: Insufficient documentation

## 2015-12-30 DIAGNOSIS — M4186 Other forms of scoliosis, lumbar region: Secondary | ICD-10-CM | POA: Diagnosis not present

## 2015-12-30 DIAGNOSIS — I10 Essential (primary) hypertension: Secondary | ICD-10-CM | POA: Insufficient documentation

## 2015-12-30 DIAGNOSIS — M7060 Trochanteric bursitis, unspecified hip: Secondary | ICD-10-CM | POA: Diagnosis not present

## 2015-12-30 DIAGNOSIS — M47816 Spondylosis without myelopathy or radiculopathy, lumbar region: Secondary | ICD-10-CM | POA: Insufficient documentation

## 2015-12-30 MED ORDER — OXYCODONE-ACETAMINOPHEN 10-650 MG PO TABS: 1.0000 | ORAL_TABLET | Freq: Four times a day (QID) | ORAL | Status: DC | PRN

## 2015-12-30 MED ORDER — OXYCODONE-ACETAMINOPHEN 10-325 MG PO TABS: 1.0000 | ORAL_TABLET | Freq: Four times a day (QID) | ORAL | Status: DC | PRN

## 2015-12-30 MED ORDER — GABAPENTIN 600 MG PO TABS: 600.0000 mg | ORAL_TABLET | Freq: Three times a day (TID) | ORAL | Status: DC

## 2015-12-30 NOTE — Progress Notes (Signed)
Safety precautions to be maintained throughout the outpatient stay will include: orient to surroundings, keep bed in low position, maintain call bell within reach at all times, provide assistance with transfer out of bed and ambulation. Oxycodone pill count #2/120  Filled 11-20-15

## 2015-12-30 NOTE — Patient Instructions (Signed)
Sodium Hyaluronate intra-articular injection What is this medicine? SODIUM HYALURONATE (SOE dee um hye al yoor ON ate) is used to treat pain in the knee due to osteoarthritis. This medicine may be used for other purposes; ask your health care provider or pharmacist if you have questions. What should I tell my health care provider before I take this medicine? They need to know if you have any of these conditions: -bleeding disorders -glaucoma -infection in the knee joint -skin conditions or sensitivity -skin infection -an unusual allergic reaction to sodium hyaluronate, other medicines, foods, dyes, or preservatives. Different brands of sodium hyaluronate contain different allergens. Some may contain egg. Talk to your doctor about your allergies to make sure that you get the right product. -pregnant or trying to get pregnant -breast-feeding How should I use this medicine? This medicine is for injection into the knee joint. It is given by a health care professional in a hospital or clinic setting. Talk to your pediatrician regarding the use of this medicine in children. Special care may be needed. Overdosage: If you think you have taken too much of this medicine contact a poison control center or emergency room at once. NOTE: This medicine is only for you. Do not share this medicine with others. What if I miss a dose? This does not apply. What may interact with this medicine? Interactions are not expected. This list may not describe all possible interactions. Give your health care provider a list of all the medicines, herbs, non-prescription drugs, or dietary supplements you use. Also tell them if you smoke, drink alcohol, or use illegal drugs. Some items may interact with your medicine. What should I watch for while using this medicine? Tell your doctor or healthcare professional if your symptoms do not start to get better or if they get worse. If receiving this medicine for osteoarthritis,  limit your activity after you receive your injection. Avoid physical activity for 48 hours following your injection to keep your knee from swelling. Do not stand on your feet for more than 1 hour at a time during the first 48 hours following your injection. Ask your doctor or healthcare professional about when you can begin major physical activity again. What side effects may I notice from receiving this medicine? Side effects that you should report to your doctor or health care professional as soon as possible: -allergic reactions like skin rash, itching or hives, swelling of the face, lips, or tongue -dizziness -facial flushing -pain, tingling, numbness in the hands or feet -vision changes if received this medicine during eye surgery Side effects that usually do not require medical attention (Report these to your doctor or health care professional if they continue or are bothersome.): -back pain -bruising at site where injected -chills -diarrhea -fever -headache -joint pain -joint stiffness -joint swelling -muscle cramps -muscle pain -nausea, vomiting -pain, redness, or irritation at site where injected -weak or tired This list may not describe all possible side effects. Call your doctor for medical advice about side effects. You may report side effects to FDA at 1-800-FDA-1088. Where should I keep my medicine? This drug is given in a hospital or clinic and will not be stored at home. NOTE: This sheet is a summary. It may not cover all possible information. If you have questions about this medicine, talk to your doctor, pharmacist, or health care provider.    2016, Elsevier/Gold Standard. (2014-11-04 11:10:34) Knee Injection A knee injection is a procedure to get medicine into your knee joint. Your  health care provider puts a needle into the joint and injects medicine with an attached syringe. The injected medicine may relieve the pain, swelling, and stiffness of arthritis. The  injected medicine may also help to lubricate and cushion your knee joint. You may need more than one injection. LET [facility]OUR [facility] CARE PROVIDER KNOW ABOUT:  Any allergies you have.  All medicines you are taking, including vitamins, herbs, eye drops, creams, and over-the-counter medicines.  Previous problems you or members of your family have had with the use of anesthetics.  Any blood disorders you have.  Previous surgeries you have had.  Any medical conditions you may have. RISKS AND COMPLICATIONS Generally, this is a safe procedure. However, problems may occur, including:  Infection.  Bleeding.  Worsening symptoms.  Damage to the area around your knee.  Allergic reaction to any of the medicines.  Skin reactions from repeated injections. BEFORE THE PROCEDURE  Ask your health care provider about changing or stopping your regular medicines. This is especially important if you are taking diabetes medicines or blood thinners.  Plan to have someone take you home after the procedure. PROCEDURE  You will sit or lie down in a position for your knee to be treated.  The skin over your kneecap will be cleaned with a germ-killing solution (antiseptic).  You will be given a medicine that numbs the area (local anesthetic). You may feel some stinging.  After your knee becomes numb, you will have a second injection. This is the medicine. This needle is carefully placed between your kneecap and your knee. The medicine is injected into the joint space.  At the end of the procedure, the needle will be removed.  A bandage (dressing) may be placed over the injection site. The procedure may vary among health care providers and hospitals. AFTER THE PROCEDURE  You may have to move your knee through its full range of motion. This helps to get all of the medicine into your joint space.  Your blood pressure, heart rate, breathing rate, and blood oxygen level will be monitored often until the  medicines you were given have worn off.  You will be watched to make sure that you do not have a reaction to the injected medicine.   This information is not intended to replace advice given to you by your health care provider. Make sure you discuss any questions you have with your health care provider.   Document Released: 12/18/2006 Document Revised: 10/17/2014 Document Reviewed: 08/06/2014 Elsevier Interactive Patient Education [employer]2016 [employer].

## 2015-12-30 NOTE — Progress Notes (Signed)
Patient's Name: Douglas Andrews MRN: [id number] DOB: 1951-08-28 DOS: 12/30/2015  Primary Reason(s) for Visit: Post-Procedure evaluation CC: Back Pain   HPI  [name] is a 65 y.o. year old, male patient, who returns today as an established patient. He has Chronic low back pain (Location of Primary Source of Pain) (Bilateral) (L>R); Chronic pain syndrome;  Lumbar DDD (degenerative disc disease);  Lumbar Spondylosis; Lumbar radicular pain (Right) (L5 Dermatome); Lumbar facet syndrome (Location of Primary Source of Pain) (Bilateral) (R>L); Opiate use (60 MME/Day); Uncomplicated opioid dependence (HCC); Long term prescription opiate use; Chronic knee pain (Location of Tertiary source of pain) (Bilateral) (Right); Vitamin D insufficiency; Overweight; Bell palsy; CN (constipation); Dermatitis, eczematoid; Diabetes mellitus (HCC); Dysmetria; Essential (primary) hypertension; Anxiety, generalized; Insomnia due to medical condition; Long term current use of systemic steroids; Type 2 diabetes mellitus (HCC); Pulmonary sarcoidosis (HCC); Arthropathy, traumatic, shoulder; Bursitis, trochanteric; Esophageal varices (HCC); Leg varices; Hepatic cirrhosis (HCC); Major depressive disorder with single episode (HCC); Other long term (current) drug therapy; At risk for falling; Encounter for chronic pain management; Encounter for therapeutic drug level monitoring; Chronic lower extremity pain (Location of Secondary source of pain) (Right); Chronic lumbar radicular pain (Right) (L5 Dermatome); Lumbar Levoscoliosis; Chronic pain; Neurogenic pain; Osteoarthritis of knee (Right); and At high risk for falls on his problem list.. His primarily concern today is the Back Pain   The patient returns to the clinics today for a follow-up of evaluation after a left-sided L4-5 lumbar epidural steroid injection under fluoroscopic guidance done on 11/26/2015. The procedure was done under fluoroscopic guidance but with no sedation.  Pain  Assessment: Self-Reported Pain Score: 7 , clinically he looks like a 3/10. Reported level is compatible with observation Pain Type: Chronic pain Pain Location: Back Pain Orientation: Lower Pain Descriptors / Indicators: Aching, Numbness, Tingling Pain Frequency: Intermittent  Date of Last Visit: 11/26/15 Service Provided on Last Visit: Procedure (LESI)  Controlled Substance Pharmacotherapy Assessment  Analgesic: Oxycodone/APAP 10/325 one every 6 hours (40 mg/day) Pill Count: Oxycodone pill count #2/120 Filled 11-20-15 MME/day: 60 mg/day Pharmacokinetics: Onset of action (Liberation/Absorption): Within expected pharmacological parameters. (45 minutes) Time to Peak effect (Distribution): Timing and results are as within normal expected parameters. (2 hours) Duration of action (Metabolism/Excretion): Within normal limits for medication. (8-10 hours) Pharmacodynamics: Analgesic Effect: 75% Activity Facilitation: Medication(s) allow patient to sit, stand, walk, and do the basic ADLs Perceived Effectiveness: Described as relatively effective, allowing for increase in activities of daily living (ADL) Side-effects or Adverse reactions: None reported Monitoring: [place] PMP: Compliant with practice rules and regulations UDS Results/interpretation: Last UDS done on 11/19/2015 came back positive for that cleared morphine but negative for the cleared oxycodone. At that time, the patient was taken MS Contin 30 mg every 12 hours + oxycodone for breakthrough pain. On the 11/19/2015 visit that MS Contin was discontinued. At the time he had indicated that he had ran out of the MS Contin and the oxycodone 3 days ago. He used to take oxycodone/APAP 10/325 twice a day. This was increased to 4 times a day and the MS Contin discontinued. With the prior regimen he was taking a total of 90 MME/Day and after the change he is down to 60 MME/Day. Medication Assessment Form: Reviewed. Patient indicates being compliant  with therapy Treatment compliance: Compliant Risk Assessment: Aberrant Behavior: None observed today and     Substance Use Disorder (SUD) Risk Level: Low Opioid Risk Tool (ORT) Score: Total Score: 0 Low Risk for SUD (Score <3) Depression  Scale Score: PHQ-2: PHQ-2 Total Score: 0 No depression (0) PHQ-9: PHQ-9 Total Score: 0 No depression (0-4)  Pharmacologic Plan: No change in therapy, at this time   Laboratory Workup  Last ED UDS: No results found for: THCU, COCAINSCRNUR, PCPSCRNUR, MDMA, AMPHETMU, METHADONE, ETOH  Inflammation Markers No results found for: ESRSEDRATE, CRP  Renal Function No results found for: BUN, CREATININE, GFRAA, GFRNONAA  Hepatic Function No results found for: AST, ALT, ALBUMIN  Electrolytes No results found for: NA, K, CL, CALCIUM, MG  Post-Procedure Assessment  Procedure done on last visit: The patient returns to the clinics today for a follow-up of evaluation after a left-sided L4-5 lumbar epidural steroid injection under fluoroscopic guidance done on 11/26/2015. The procedure was done under fluoroscopic guidance but with no sedation. Side-effects or Adverse reactions: None reported Sedation: No sedation used  Results: Ultra-Short Term Relief (First 1 hour after procedure): 0 %  No IV Analgesics or Anxiolytics given, therefore the benefit is completely due to Local Anesthetics Short Term Relief (Initial 4-6 hrs after procedure): 0 % No analgesic effect could suggest the source of pain to be elsewhere Long Term Relief : 50 % Long-term benefit would suggest an inflammatory etiology to the pain   Current Relief (Now):  50%  Persistent relief would suggest effective anti-inflammatory effects from steroids Interpretation of Results: These results could suggest that the patient does have an inflammatory condition however it may not be located were we did to injection. Further investigation and evaluation is warranted.  Allergies  [name] has No Known  Allergies.  Meds  The patient has a current medication list which includes the following prescription(s): albuterol, aspirin ec, atorvastatin, vitamin d3, glucose blood, hydroxyzine, ibuprofen-diphenhydramine cit, insulin glargine, insulin lispro, insulin pen needle, lisinopril, metformin, nitroglycerin, omeprazole, oxycodone-acetaminophen, prednisone, sertraline, gabapentin, insulin lispro, oxycodone-acetaminophen, and oxycodone-acetaminophen.  Current Outpatient Prescriptions on File Prior to Visit  Medication Sig  . albuterol (PROAIR HFA) 108 (90 Base) MCG/ACT inhaler INHALE 1-2 PUFFS BY MOUTH AS NEEDED  . aspirin EC 81 MG tablet Take 81 mg by mouth daily.  Marland Kitchen atorvastatin (LIPITOR) 40 MG tablet Take 40 mg by mouth daily.  . Cholecalciferol (VITAMIN D3) 5000 units TABS Take 1 tablet by mouth daily.   Marland Kitchen glucose blood (ACCU-CHEK ACTIVE STRIPS) test strip Checks 3 times daily  . hydrOXYzine (VISTARIL) 25 MG capsule Take 50 mg by mouth.  . Ibuprofen-Diphenhydramine Cit (ADVIL PM PO) Take 1 tablet by mouth at bedtime.  . insulin glargine (LANTUS) 100 UNIT/ML injection Inject 52 Units into the skin at bedtime.   . Insulin Pen Needle (B-D ULTRAFINE III SHORT PEN) 31G X 8 MM MISC Reported on 10/07/2015  . lisinopril (PRINIVIL,ZESTRIL) 5 MG tablet Take 5 mg by mouth daily.  . metFORMIN (GLUCOPHAGE) 500 MG tablet Take 1,000 mg by mouth 2 (two) times daily with a meal.  . nitroGLYCERIN (NITROSTAT) 0.4 MG SL tablet Place 0.4 mg under the tongue every 5 (five) minutes as needed for chest pain.  Marland Kitchen omeprazole (PRILOSEC) 20 MG capsule Take 20 mg by mouth daily.  . predniSONE (DELTASONE) 5 MG tablet Take 5 mg by mouth daily with breakfast.   . sertraline (ZOLOFT) 50 MG tablet Take 50 mg by mouth daily.  . insulin lispro (HUMALOG) 100 UNIT/ML KiwkPen Inject 18 Units into the skin 2 (two) times daily.    No current facility-administered medications on file prior to visit.    ROS  Constitutional: Afebrile,  no chills, well hydrated and well  nourished Gastrointestinal: negative Musculoskeletal:negative Neurological: negative Behavioral/Psych: negative  PFSH  Medical:  [name]  has a past medical history of Diabetes mellitus without complication (HCC); Hyperlipidemia; Hypertension; Chronic back pain; Sarcoidosis (HCC); Right leg pain (07/15/2015); and Left leg pain (01/02/2014). Family: family history includes Diabetes in his father; Heart disease in his father and mother; Stroke in his father. Surgical:  has past surgical history that includes Appendectomy; Tonsillectomy; and left rod. Tobacco:  reports that he has never smoked. He has never used smokeless tobacco. Alcohol:  reports that he does not drink alcohol. Drug:  reports that he does not use illicit drugs.  Physical Exam  Vitals:  Today's Vitals   12/30/15 1323 12/30/15 1327  BP: 141/89   Pulse: 83   Temp: 98.9 F (37.2 C)   Resp: 16   Height: 5\' 8"  (1.727 m)   Weight: 180 lb (81.647 kg)   SpO2: 100%   PainSc: 7  7   PainLoc: Back     Calculated BMI: Body mass index is 27.38 kg/(m^2). Overweight (25-29.9 kg/m2) - 20% higher incidence of chronic pain  General appearance: alert, cooperative, appears stated age and no distress Eyes: PERLA Respiratory: No evidence respiratory distress, no audible rales or ronchi and no use of accessory muscles of respiration  Lumbar Spine Inspection: No gross anomalies detected Alignment: Symetrical ROM: Adequate  Gait: Antalgic (limping)  Lower Extremities Inspection: No gross anomalies detected ROM: Adequate Sensory:  Normal Motor: Grossly intact  Assessment & Plan  Primary Diagnosis & Pertinent Problem List: The primary encounter diagnosis was Chronic pain. Diagnoses of Encounter for therapeutic drug level monitoring, Long term current use of systemic steroids, Vitamin D insufficiency, Chronic knee pain (Bilateral) (L>R), Neurogenic pain, Primary osteoarthritis of right knee,  and At high risk for falls were also pertinent to this visit.  Visit Diagnosis: 1. Chronic pain   2. Encounter for therapeutic drug level monitoring   3. Long term current use of systemic steroids   4. Vitamin D insufficiency   5. Chronic knee pain (Bilateral) (L>R)   6. Neurogenic pain   7. Primary osteoarthritis of right knee   8. At high risk for falls     Problem-specific Plan(s): No problem-specific assessment & plan notes found for this encounter.   Plan of Care  Pharmacotherapy (Medications Ordered): Meds ordered this encounter  Medications  . DISCONTD: oxyCODONE-acetaminophen (PERCOCET) 10-325 MG tablet    Sig: Take 1 tablet by mouth every 6 (six) hours as needed for pain.    Dispense:  120 tablet    Refill:  0    Do not place this medication, or any other prescription from our practice, on "Automatic Refill". Patient may have prescription filled one day early if pharmacy is closed on scheduled refill date. Do not fill until: 12/30/15 To last until: 01/29/16  . DISCONTD: oxyCODONE-acetaminophen (PERCOCET) 10-650 MG tablet    Sig: Take 1 tablet by mouth every 6 (six) hours as needed for pain.    Dispense:  120 tablet    Refill:  0    Do not place this medication, or any other prescription from our practice, on "Automatic Refill". Patient may have prescription filled one day early if pharmacy is closed on scheduled refill date. Do not fill until: 01/29/16 To last until: 02/28/16  . DISCONTD: oxyCODONE-acetaminophen (PERCOCET) 10-650 MG tablet    Sig: Take 1 tablet by mouth every 6 (six) hours as needed for pain.    Dispense:  120 tablet  Refill:  0    Do not place this medication, or any other prescription from our practice, on "Automatic Refill". Patient may have prescription filled one day early if pharmacy is closed on scheduled refill date. Do not fill until: 02/28/16 To last until: 03/29/16  . gabapentin (NEURONTIN) 600 MG tablet    Sig: Take 1 tablet (600 mg  total) by mouth every 8 (eight) hours.    Dispense:  90 tablet    Refill:  2    Do not place this medication, or any other prescription from our practice, on "Automatic Refill". Patient may have prescription filled one day early if pharmacy is closed on scheduled refill date.  Marland Kitchen oxyCODONE-acetaminophen (PERCOCET) 10-325 MG tablet    Sig: Take 1 tablet by mouth every 6 (six) hours as needed for pain.    Dispense:  120 tablet    Refill:  0    Do not place this medication, or any other prescription from our practice, on "Automatic Refill". Patient may have prescription filled one day early if pharmacy is closed on scheduled refill date. Do not fill until: 01/29/16 To last until: 02/28/16  . oxyCODONE-acetaminophen (PERCOCET) 10-325 MG tablet    Sig: Take 1 tablet by mouth every 6 (six) hours as needed for pain.    Dispense:  120 tablet    Refill:  0    Do not place this medication, or any other prescription from our practice, on "Automatic Refill". Patient may have prescription filled one day early if pharmacy is closed on scheduled refill date. Do not fill until: 02/28/16 To last until: 03/29/16  . oxyCODONE-acetaminophen (PERCOCET) 10-325 MG tablet    Sig: Take 1 tablet by mouth every 6 (six) hours as needed for pain.    Dispense:  120 tablet    Refill:  0    Do not place this medication, or any other prescription from our practice, on "Automatic Refill". Patient may have prescription filled one day early if pharmacy is closed on scheduled refill date. Do not fill until: 12/30/15 To last until: 01/29/16    [facility] & Procedure Ordered: Orders Placed This Encounter  Procedures  . KNEE INJECTION    Standing Status: Future     Number of Occurrences:      Standing Expiration Date: 12/29/2016    Scheduling Instructions:     Side: Right-sided     Sedation: No Sedation.     Timeframe: ASAA    Order Specific Question:  Where will this procedure be performed?    Answer:  [facility]  . DG Knee 1-2 Views Right    Standing Status: Future     Number of Occurrences:      Standing Expiration Date: 12/29/2016    Order Specific Question:  Reason for Exam (SYMPTOM  OR DIAGNOSIS REQUIRED)    Answer:  Right knee pain/arthralgia    Order Specific Question:  Preferred imaging location?    Answer:  [facility]    Order Specific Question:  Call Results- Best Contact Number?    Answer:  [phone] (Pain Clinic facility) (Dr. Laban Emperor)    Imaging Ordered: DG KNEE 1-2 VIEWS RIGHT  Interventional Therapies: Scheduled: Diagnostic right intra-articular knee injection with local anesthetic and steroid. PRN Procedures: Hyalgan series of 5 intra-articular knee injections on the right side.    Referral(s) or Consult(s): None at this point.  Medications administered during this visit: [name] had no medications administered during this visit.  Future Appointments  Date Time Provider Department Center  01/05/2016 8:20 AM Delano Metz, MD [facility] None  03/30/2016 8:00 AM Delano Metz, MD [facility] None    Primary Care Physician: No primary care provider on file. Location: [facility] Note by: Hillman Attig A. Laban Emperor, M.D, DABA, DABAPM, DABPM, DABIPP, FIPP  Pain Score Disclaimer: We use the NRS-11 scale. This is a self-reported, subjective measurement of pain severity with only modest accuracy. It is used primarily to identify changes within a particular patient. It must be understood that outpatient pain scales are significantly less accurate that those used for research, where they can be applied under ideal controlled circumstances with minimal exposure to variables. In reality, the score is likely to be a combination of pain intensity and pain affect, where pain affect describes the degree of emotional arousal or changes in action readiness caused by the sensory experience of pain. Factors such as social and work situation,  setting, emotional state, anxiety levels, expectation, and prior pain experience may influence pain perception and show large inter-individual differences that may also be affected by time variables.

## 2016-01-03 DIAGNOSIS — Z9181 History of falling: Secondary | ICD-10-CM | POA: Insufficient documentation

## 2016-01-05 ENCOUNTER — Encounter: Payer: Self-pay | Admitting: Pain Medicine

## 2016-01-05 ENCOUNTER — Ambulatory Visit: Payer: Medicare Other | Attending: Pain Medicine | Admitting: Pain Medicine

## 2016-01-05 VITALS — BP 153/87 | HR 65 | Temp 98.0°F | Resp 16 | Ht 68.0 in | Wt 180.0 lb

## 2016-01-05 DIAGNOSIS — K5900 Constipation, unspecified: Secondary | ICD-10-CM | POA: Insufficient documentation

## 2016-01-05 DIAGNOSIS — E559 Vitamin D deficiency, unspecified: Secondary | ICD-10-CM | POA: Diagnosis not present

## 2016-01-05 DIAGNOSIS — L309 Dermatitis, unspecified: Secondary | ICD-10-CM | POA: Diagnosis not present

## 2016-01-05 DIAGNOSIS — R278 Other lack of coordination: Secondary | ICD-10-CM | POA: Insufficient documentation

## 2016-01-05 DIAGNOSIS — M545 Low back pain: Secondary | ICD-10-CM | POA: Diagnosis not present

## 2016-01-05 DIAGNOSIS — I10 Essential (primary) hypertension: Secondary | ICD-10-CM | POA: Insufficient documentation

## 2016-01-05 DIAGNOSIS — F1120 Opioid dependence, uncomplicated: Secondary | ICD-10-CM | POA: Diagnosis not present

## 2016-01-05 DIAGNOSIS — M129 Arthropathy, unspecified: Secondary | ICD-10-CM | POA: Diagnosis not present

## 2016-01-05 DIAGNOSIS — M25561 Pain in right knee: Secondary | ICD-10-CM | POA: Diagnosis not present

## 2016-01-05 DIAGNOSIS — Z7952 Long term (current) use of systemic steroids: Secondary | ICD-10-CM | POA: Insufficient documentation

## 2016-01-05 DIAGNOSIS — E119 Type 2 diabetes mellitus without complications: Secondary | ICD-10-CM | POA: Diagnosis not present

## 2016-01-05 DIAGNOSIS — M1711 Unilateral primary osteoarthritis, right knee: Secondary | ICD-10-CM | POA: Diagnosis not present

## 2016-01-05 DIAGNOSIS — G8929 Other chronic pain: Secondary | ICD-10-CM | POA: Insufficient documentation

## 2016-01-05 DIAGNOSIS — E663 Overweight: Secondary | ICD-10-CM | POA: Insufficient documentation

## 2016-01-05 DIAGNOSIS — M25562 Pain in left knee: Secondary | ICD-10-CM | POA: Insufficient documentation

## 2016-01-05 DIAGNOSIS — M47896 Other spondylosis, lumbar region: Secondary | ICD-10-CM | POA: Insufficient documentation

## 2016-01-05 DIAGNOSIS — F411 Generalized anxiety disorder: Secondary | ICD-10-CM | POA: Insufficient documentation

## 2016-01-05 DIAGNOSIS — M5136 Other intervertebral disc degeneration, lumbar region: Secondary | ICD-10-CM | POA: Insufficient documentation

## 2016-01-05 DIAGNOSIS — F329 Major depressive disorder, single episode, unspecified: Secondary | ICD-10-CM | POA: Diagnosis not present

## 2016-01-05 DIAGNOSIS — G510 Bell's palsy: Secondary | ICD-10-CM | POA: Diagnosis not present

## 2016-01-05 DIAGNOSIS — M419 Scoliosis, unspecified: Secondary | ICD-10-CM | POA: Diagnosis not present

## 2016-01-05 DIAGNOSIS — M7550 Bursitis of unspecified shoulder: Secondary | ICD-10-CM | POA: Insufficient documentation

## 2016-01-05 DIAGNOSIS — D860 Sarcoidosis of lung: Secondary | ICD-10-CM | POA: Diagnosis not present

## 2016-01-05 DIAGNOSIS — M25569 Pain in unspecified knee: Secondary | ICD-10-CM | POA: Diagnosis present

## 2016-01-05 DIAGNOSIS — R202 Paresthesia of skin: Secondary | ICD-10-CM | POA: Insufficient documentation

## 2016-01-05 DIAGNOSIS — K7460 Unspecified cirrhosis of liver: Secondary | ICD-10-CM | POA: Insufficient documentation

## 2016-01-05 MED ORDER — LIDOCAINE HCL (PF) 1 % IJ SOLN
INTRAMUSCULAR | Status: AC
  Administered 2016-01-05: 09:00:00
  Filled 2016-01-05: qty 5

## 2016-01-05 MED ORDER — METHYLPREDNISOLONE ACETATE 80 MG/ML IJ SUSP: 80.0000 mg | Freq: Once | INTRAMUSCULAR | Status: DC

## 2016-01-05 MED ORDER — ROPIVACAINE HCL 2 MG/ML IJ SOLN: 4.0000 mL | Freq: Once | INTRAMUSCULAR | Status: DC

## 2016-01-05 MED ORDER — LIDOCAINE HCL (PF) 1 % IJ SOLN: 10.0000 mL | Freq: Once | INTRAMUSCULAR | Status: DC

## 2016-01-05 MED ORDER — ROPIVACAINE HCL 2 MG/ML IJ SOLN
INTRAMUSCULAR | Status: AC
  Administered 2016-01-05: 09:00:00
  Filled 2016-01-05: qty 10

## 2016-01-05 MED ORDER — METHYLPREDNISOLONE ACETATE 80 MG/ML IJ SUSP
INTRAMUSCULAR | Status: AC
  Administered 2016-01-05: 09:00:00
  Filled 2016-01-05: qty 1

## 2016-01-05 NOTE — Progress Notes (Signed)
Patient's Name: Douglas Andrews MRN: [id number] DOB: 11/13/1950 DOS: 01/05/2016  Primary Reason(s) for Visit: Interventional Pain Management Treatment. CC: Knee Pain   Pre-Procedure Assessment:  [name] is a 65 y.o. year old, male patient, seen today for interventional treatment. He has Chronic low back pain (Location of Primary Source of Pain) (Bilateral) (L>R); Chronic pain syndrome;  Lumbar DDD (degenerative disc disease);  Lumbar Spondylosis; Lumbar radicular pain (Right) (L5 Dermatome); Lumbar facet syndrome (Location of Primary Source of Pain) (Bilateral) (R>L); Opiate use (60 MME/Day); Uncomplicated opioid dependence ([place]); Long term prescription opiate use; Chronic knee pain (Location of Tertiary source of pain) (Bilateral) (Right); Vitamin D insufficiency; Overweight; Bell palsy; CN (constipation); Dermatitis, eczematoid; Diabetes mellitus ([place]); Dysmetria; Essential (primary) hypertension; Anxiety, generalized; Insomnia due to medical condition; Long term current use of systemic steroids; Type 2 diabetes mellitus ([place]); Pulmonary sarcoidosis ([place]); Arthropathy, traumatic, shoulder; Bursitis, trochanteric; Esophageal varices ([place]); Leg varices; Hepatic cirrhosis ([place]); Major depressive disorder with single episode ([place]); Other long term (current) drug therapy; At risk for falling; Encounter for chronic pain management; Encounter for therapeutic drug level monitoring; Chronic lower extremity pain (Location of Secondary source of pain) (Right); Chronic lumbar radicular pain (Right) (L5 Dermatome); Lumbar Levoscoliosis; Chronic pain; Neurogenic pain; Osteoarthritis of knee (Right); and At high risk for falls on his problem list.. His primarily concern today is the Knee Pain   Today's Initial Pain Score: 6/10 Reported level of pain is incompatible with clinical obrservations. This may be secondary to a possible lack of understanding on how the pain scale works. Pain Type: Chronic pain Pain  Location: Knee Pain Orientation: Right Pain Descriptors / Indicators: Aching, Constant, Tingling Pain Frequency: Intermittent  Post-procedure Pain Score: 6   Date of Last Visit: 12/30/15 Service Provided on Last Visit: Evaluation (post eval of lesi in february)  Verification of the correct person, correct site (including marking of site), and correct procedure were performed and confirmed by the patient.  Today's Vitals   01/05/16 0810 01/05/16 0811 01/05/16 0833 01/05/16 0835  BP: 152/79  153/87 153/87  Pulse: 64  62 65  Temp: 97.6 F (36.4 C)   98 F (36.7 C)  TempSrc: Oral     Resp: '16  16 16  '$ Height: '5\' 8"'$  (1.727 m)     Weight: 180 lb (81.647 kg)     SpO2: 99%  100% 100%  PainSc: '6  6  6  6   '$ PainLoc: Knee     Calculated BMI: Body mass index is 27.38 kg/(m^2). Allergies: He has No Known Allergies.. Primary Diagnosis: Primary osteoarthritis of right knee [M17.11]  Procedure:  Type: Therapeutic Intra-Articular Knee Injection (Local anesthetic and steroid) Region:  Knee Region Level: Knee Joint Laterality: Bilateral  Indications: 1. Primary osteoarthritis of right knee   2. Chronic knee pain (Location of Tertiary source of pain) (Bilateral) (Right)     In addition, [name] has Chronic low back pain (Location of Primary Source of Pain) (Bilateral) (L>R); Chronic pain syndrome;  Lumbar DDD (degenerative disc disease);  Lumbar Spondylosis; Lumbar radicular pain (Right) (L5 Dermatome); Lumbar facet syndrome (Location of Primary Source of Pain) (Bilateral) (R>L); Chronic knee pain (Location of Tertiary source of pain) (Bilateral) (Right); Arthropathy, traumatic, shoulder; Bursitis, trochanteric; Chronic lower extremity pain (Location of Secondary source of pain) (Right); Chronic lumbar radicular pain (Right) (L5 Dermatome); Lumbar Levoscoliosis; Chronic pain; Neurogenic pain; and Osteoarthritis of knee (Right) on his pertinent problem list.  Consent: Secured. Under the  influence of no sedatives a  written informed consent was obtained, after having provided information on the risks and possible complications. To fulfill our ethical and legal obligations, as recommended by the American Medical Association's Code of Ethics, we have provided information to the patient about our clinical impression; the nature and purpose of the treatment or procedure; the risks, benefits, and possible complications of the intervention; alternatives; the risk(s) and benefit(s) of the alternative treatment(s) or procedure(s); and the risk(s) and benefit(s) of doing nothing. The patient was provided information about the risks and possible complications associated with the procedure. These include, but are not limited to, failure to achieve desired goals, infection, bleeding, organ or nerve damage, allergic reactions, paralysis, and death. In the case of intra- or periarticular procedures these may include, but are not limited to, failure to achieve desired goals, infection, bleeding (hemarthrosis), organ or nerve damage, allergic reactions, and death. In addition, the patient was informed that Medicine is not an exact science; therefore, there is also the possibility of unforeseen risks and possible complications that may result in a catastrophic outcome. The patient indicated having understood very clearly. We have given the patient no guarantees and we have made no promises. Enough time was given to the patient to ask questions, all of which were answered to the patient's satisfaction.  Pre-Procedure Preparation: Safety Precautions: Allergies reviewed. Appropriate site, procedure, and patient were confirmed by following the Joint Commission's Universal Protocol (UP.01.01.01), in the form of a "Time Out". The patient was asked to confirm marked site and procedure, before commencing. The patient was asked about blood thinners, or active infections, both of which were denied. Patient was assessed for  positional comfort and all pressure points were checked before starting procedure. Monitoring:  As per clinic protocol. Infection Control Precautions: Sterile technique used. Standard Universal Precautions were taken as recommended by the Department of [facility] for Disease Control and Prevention (CDC). Standard pre-surgical skin prep was conducted. Respiratory hygiene and cough etiquette was practiced. Hand hygiene observed. Safe injection practices and needle disposal techniques followed. SDV (single dose vial) medications used. Medications properly checked for expiration dates and contaminants. Personal protective equipment (PPE) used: Sterile surgical gloves.  Anesthesia, Analgesia, Anxiolysis: Type: Local Anesthesia Local Anesthetic: Lidocaine 1% Route: Infiltration ([place]/IM) IV Access: Declined Sedation: Declined  Indication(s): Analgesia    Description of Procedure Process:  Time-out: "Time-out" completed before starting procedure, as per protocol. Position: Sitting Target Area: Knee Joint Approach: Lateral approach. Area Prepped: Entire knee area, from the mid-thigh to the mid-shin. Prepping solution: ChloraPrep (2% chlorhexidine gluconate and 70% isopropyl alcohol) Safety Precautions: Aspiration looking for blood return was conducted prior to all injections. At no point did we inject any substances, as a needle was being advanced. No attempts were made at seeking any paresthesias. Safe injection practices and needle disposal techniques used. Medications properly checked for expiration dates. SDV (single dose vial) medications used.    Description of the Procedure: Protocol guidelines were followed. The patient was placed in position over the fluoroscopy table. The target area was identified and the area prepped in the usual manner. Skin desensitized using vapocoolant spray. Skin & deeper tissues infiltrated with local anesthetic. Appropriate amount of time allowed to pass for  local anesthetics to take effect. The procedure needles were then advanced to the target area. Proper needle placement secured. Negative aspiration confirmed. Solution injected in intermittent fashion, asking for systemic symptoms every 0.5cc of injectate. The needles were then removed and the area cleansed, making sure to leave some  of the prepping solution back to take advantage of its long term bactericidal properties. EBL: None Materials & Medications Used:  Needle(s) Used: 22g - 1.5" Needle(s) Medications Administered today: We administered ropivacaine (PF) 2 mg/ml (0.2%), lidocaine (PF), and methylPREDNISolone acetate.Please see chart orders for dosing details.  Imaging Guidance:  Type of Imaging Technique: None  Antibiotic Prophylaxis:  Indication(s): No indications identified. Type:  Antibiotics Given (last 72 hours)    None       Post-operative Assessment:  Complications: No immediate post-treatment complications were observed. Disposition: Return to clinic for follow-up evaluation. The patient tolerated the entire procedure well. A repeat set of vitals were taken after the procedure and the patient was kept under observation following institutional policy, for this type of procedure. The patient was discharged home, once institutional criteria were met. The patient was provided with post-procedure discharge instructions, including a section on how to identify potential problems. Should any problems arise concerning this procedure, the patient was given instructions to immediately contact us, at any time, without hesitation. In any case, we plan to contact the patient by telephone for a follow-up status report regarding this interventional procedure. Comments:  When the patient left the clinic his pain score was still a 6/10. This would suggest that his knee pain is not, from the knee itself, but from his lower back. We'll review the full results when he returns in 2  weeks.  Medications administered during this visit: We administered ropivacaine (PF) 2 mg/ml (0.2%), lidocaine (PF), and methylPREDNISolone acetate.  Prescriptions ordered during this visit: New Prescriptions   No medications on file    Future Appointments Date Time Provider [place]  01/18/2016 8:40 AM Milinda Pointer, MD [facility] None  03/30/2016 8:00 AM Milinda Pointer, MD [facility] None    Primary Care Physician: Fritzi Mandes, MD Location: [facility] Note by: Kathlen Brunswick. Dossie Arbour, M.D, DABA, DABAPM, DABPM, DABIPP, FIPP  Disclaimer:  Medicine is not an exact science. The only guarantee in medicine is that nothing is guaranteed. It is important to note that the decision to proceed with this intervention was based on the information collected from the patient. The Data and conclusions were drawn from the patient's questionnaire, the interview, and the physical examination. Because the information was provided in large part by the patient, it cannot be guaranteed that it has not been purposely or unconsciously manipulated. Every effort has been made to obtain as much relevant data as possible for this evaluation. It is important to note that the conclusions that lead to this procedure are derived in large part from the available data. Always take into account that the treatment will also be dependent on availability of resources and existing treatment guidelines, considered by other Pain Management Practitioners as being common knowledge and practice, at the time of the intervention. For Medico-Legal purposes, it is also important to point out that variation in procedural techniques and pharmacological choices are the acceptable norm. The indications, contraindications, technique, and results of the above procedure should only be interpreted and judged by a Board-Certified Interventional Pain Specialist with extensive familiarity and expertise in  the same exact procedure and technique. Attempts at providing opinions without similar or greater experience and expertise than that of the treating physician will be considered as inappropriate and unethical, and shall result in a formal complaint to the state medical board and applicable specialty societies. Daisyreviewed the middle ear

## 2016-01-06 ENCOUNTER — Telehealth: Payer: Self-pay

## 2016-01-06 NOTE — Telephone Encounter (Signed)
Post procedure phone call done. States he is doing OK.

## 2016-01-08 DIAGNOSIS — R079 Chest pain, unspecified: Secondary | ICD-10-CM | POA: Insufficient documentation

## 2016-01-08 DIAGNOSIS — L989 Disorder of the skin and subcutaneous tissue, unspecified: Secondary | ICD-10-CM | POA: Insufficient documentation

## 2016-01-18 ENCOUNTER — Encounter: Payer: Self-pay | Admitting: Pain Medicine

## 2016-01-18 ENCOUNTER — Ambulatory Visit: Payer: Medicare Other | Attending: Pain Medicine | Admitting: Pain Medicine

## 2016-01-18 VITALS — BP 148/95 | HR 72 | Temp 97.9°F | Resp 18 | Ht 68.0 in | Wt 180.0 lb

## 2016-01-18 DIAGNOSIS — G510 Bell's palsy: Secondary | ICD-10-CM | POA: Diagnosis not present

## 2016-01-18 DIAGNOSIS — M25569 Pain in unspecified knee: Secondary | ICD-10-CM | POA: Diagnosis present

## 2016-01-18 DIAGNOSIS — E119 Type 2 diabetes mellitus without complications: Secondary | ICD-10-CM | POA: Diagnosis not present

## 2016-01-18 DIAGNOSIS — M5416 Radiculopathy, lumbar region: Secondary | ICD-10-CM | POA: Diagnosis not present

## 2016-01-18 DIAGNOSIS — M4186 Other forms of scoliosis, lumbar region: Secondary | ICD-10-CM | POA: Diagnosis not present

## 2016-01-18 DIAGNOSIS — M1711 Unilateral primary osteoarthritis, right knee: Secondary | ICD-10-CM | POA: Diagnosis not present

## 2016-01-18 DIAGNOSIS — G8929 Other chronic pain: Secondary | ICD-10-CM | POA: Insufficient documentation

## 2016-01-18 DIAGNOSIS — K5900 Constipation, unspecified: Secondary | ICD-10-CM | POA: Insufficient documentation

## 2016-01-18 DIAGNOSIS — F329 Major depressive disorder, single episode, unspecified: Secondary | ICD-10-CM | POA: Insufficient documentation

## 2016-01-18 DIAGNOSIS — I10 Essential (primary) hypertension: Secondary | ICD-10-CM | POA: Diagnosis not present

## 2016-01-18 DIAGNOSIS — M545 Low back pain: Secondary | ICD-10-CM | POA: Diagnosis not present

## 2016-01-18 DIAGNOSIS — M549 Dorsalgia, unspecified: Secondary | ICD-10-CM | POA: Diagnosis present

## 2016-01-18 DIAGNOSIS — M5136 Other intervertebral disc degeneration, lumbar region: Secondary | ICD-10-CM | POA: Diagnosis not present

## 2016-01-18 DIAGNOSIS — M47816 Spondylosis without myelopathy or radiculopathy, lumbar region: Secondary | ICD-10-CM | POA: Diagnosis not present

## 2016-01-18 DIAGNOSIS — L309 Dermatitis, unspecified: Secondary | ICD-10-CM | POA: Insufficient documentation

## 2016-01-18 DIAGNOSIS — F1120 Opioid dependence, uncomplicated: Secondary | ICD-10-CM | POA: Diagnosis not present

## 2016-01-18 DIAGNOSIS — M79606 Pain in leg, unspecified: Secondary | ICD-10-CM

## 2016-01-18 DIAGNOSIS — E559 Vitamin D deficiency, unspecified: Secondary | ICD-10-CM | POA: Insufficient documentation

## 2016-01-18 DIAGNOSIS — D860 Sarcoidosis of lung: Secondary | ICD-10-CM | POA: Insufficient documentation

## 2016-01-18 DIAGNOSIS — M25561 Pain in right knee: Secondary | ICD-10-CM | POA: Diagnosis not present

## 2016-01-18 DIAGNOSIS — M25562 Pain in left knee: Secondary | ICD-10-CM | POA: Diagnosis not present

## 2016-01-18 NOTE — Progress Notes (Signed)
Safety precautions to be maintained throughout the outpatient stay will include: orient to surroundings, keep bed in low position, maintain call bell within reach at all times, provide assistance with transfer out of bed and ambulation.  

## 2016-01-18 NOTE — Progress Notes (Signed)
Patient's Name: Douglas Andrews  Patient type: Established  MRN: [id number]  Service setting: Ambulatory outpatient  DOB: [birth year]  Location: [facility]  DOS: 01/18/2016  Primary Care Physician: Douglas LookFOLEY, STEPHANIE J, MD  Note by: Sydnee LevansFrancisco A. Laban EmperorNaveira, M.D, DABA, DABAPM, DABPM, DABIPP, FIPP  Referring Physician: No ref. provider found  Specialty: Board-Certified Interventional Pain Management     Primary Reason(s) for Visit: Encounter for post-procedure evaluation of chronic illness with mild to moderate exacerbation CC: Back Pain and Knee Pain   HPI  [name] is a 65 y.o. year old, male patient, who returns today as an established patient. He has Chronic low back pain (Location of Primary Source of Pain) (Bilateral) (L>R); Chronic pain syndrome;  Lumbar DDD (degenerative disc disease);  Lumbar Spondylosis; Lumbar radicular pain (Right) (L5 Dermatome); Lumbar facet syndrome (Location of Primary Source of Pain) (Bilateral) (R>L); Opiate use (60 MME/Day); Uncomplicated opioid dependence (HCC); Long term prescription opiate use; Chronic knee pain (Location of Tertiary source of pain) (Bilateral) (Right); Vitamin D insufficiency; Overweight; Bell palsy; CN (constipation); Dermatitis, eczematoid; Diabetes mellitus (HCC); Dysmetria; Essential (primary) hypertension; Anxiety, generalized; Insomnia due to medical condition; Long term current use of systemic steroids; Type 2 diabetes mellitus (HCC); Pulmonary sarcoidosis (HCC); Arthropathy, traumatic, shoulder; Bursitis, trochanteric; Esophageal varices (HCC); Leg varices; Hepatic cirrhosis (HCC); Major depressive disorder with single episode (HCC); Other long term (current) drug therapy; At risk for falling; Encounter for chronic pain management; Encounter for therapeutic drug level monitoring; Chronic lower extremity pain (Location of Secondary source of pain) (Right); Chronic lumbar radicular pain (Right) (L5 Dermatome); Lumbar  Levoscoliosis; Chronic pain; Neurogenic pain; Osteoarthritis of knee (Right); At high risk for falls; Chest pain; and Skin lesion on his problem list.. His primarily concern today is the Back Pain and Knee Pain   Pain Assessment: Self-Reported Pain Score: 0-No pain Reported level is compatible with observation Pain Type: Chronic pain Pain Location: Knee (no pain in right knee, lower back pain is 5) Pain Orientation: Right (no pain) Pain Descriptors / Indicators:  (no pain) Pain Frequency:  (no pain today)  The patient returns to the clinic today after having had a bilateral intra-articular knee injection with local anesthetic and steroid. The patient indicates having attained 100% relief of the knee pain and doing great. At this point, we will see him on a prn basis.  Date of Last Visit: 01/05/16 Service Provided on Last Visit: Procedure (knee injection)  Post-Procedure Assessment  Procedure done on last visit: Diagnostic bilateral intra-articular knee injection with local anesthetic and steroids, no sedation or fluoroscopy. Side-effects or Adverse reactions: None reported Sedation: No sedation used  Results: Ultra-Short Term Relief (First 1 hour after procedure): 0 %  Analgesia during this period is likely to be Local Anesthetic and/or IV Sedative (Analgesic/Anxiolitic) related Short Term Relief (Initial 4-6 hrs after procedure): 0 % Complete relief confirms area to be the source of pain Long Term Relief : 100 % Long-term benefit would suggest an inflammatory etiology to the pain   Current Relief (Now): 100%  Persistent relief would suggest effective anti-inflammatory effects from steroids Interpretation of Results: The patient is extremely happy with the results and should the pain return, he may be a good candidate for bilateral intra-articular Hyalgan knee injections.  Laboratory Chemistry  Inflammation Markers No results found for: ESRSEDRATE, CRP  Renal Function No results  found for: BUN, CREATININE, GFRAA, GFRNONAA  Hepatic Function No results found for: AST, ALT, ALBUMIN  Electrolytes No results found  for: NA, K, CL, CALCIUM, MG  Pain Modulating Vitamins No results found for: VD25OH, ZO109UE4VWU, [id number], [id number], VITAMINB12  Coagulation Parameters No results found for: INR, LABPROT  Note: I personally reviewed the above data. Results shared with patient.  Meds  The patient has a current medication list which includes the following prescription(s): albuterol, aspirin ec, atorvastatin, vitamin d3, gabapentin, glucose blood, hydroxyzine, ibuprofen-diphenhydramine cit, insulin glargine, insulin lispro, insulin pen needle, lisinopril, metformin, nitroglycerin, omeprazole, oxycodone-acetaminophen, oxycodone-acetaminophen, oxycodone-acetaminophen, prednisone, sertraline, and insulin lispro, and the following Facility-Administered Medications: lidocaine (pf).  Current Outpatient Prescriptions on File Prior to Visit  Medication Sig  . albuterol (PROAIR HFA) 108 (90 Base) MCG/ACT inhaler INHALE 1-2 PUFFS BY MOUTH AS NEEDED  . aspirin EC 81 MG tablet Take 81 mg by mouth daily.  Marland Kitchen atorvastatin (LIPITOR) 40 MG tablet Take 40 mg by mouth daily.  . Cholecalciferol (VITAMIN D3) 5000 units TABS Take 1 tablet by mouth daily.   Marland Kitchen gabapentin (NEURONTIN) 600 MG tablet Take 1 tablet (600 mg total) by mouth every 8 (eight) hours.  Marland Kitchen glucose blood (ACCU-CHEK ACTIVE STRIPS) test strip Checks 3 times daily  . hydrOXYzine (VISTARIL) 25 MG capsule Take 50 mg by mouth.  . Ibuprofen-Diphenhydramine Cit (ADVIL PM PO) Take 1 tablet by mouth at bedtime.  . insulin glargine (LANTUS) 100 UNIT/ML injection Inject 54 Units into the skin at bedtime.   . insulin lispro (HUMALOG) 100 UNIT/ML KiwkPen Inject 18 Units into the skin 2 (two) times daily.   . Insulin Pen Needle (B-D ULTRAFINE III SHORT PEN) 31G X 8 MM MISC Reported on 10/07/2015  . lisinopril (PRINIVIL,ZESTRIL) 5 MG tablet  Take 5 mg by mouth daily.  . metFORMIN (GLUCOPHAGE) 500 MG tablet Take 1,000 mg by mouth 2 (two) times daily with a meal.  . nitroGLYCERIN (NITROSTAT) 0.4 MG SL tablet Place 0.4 mg under the tongue every 5 (five) minutes as needed for chest pain.  Marland Kitchen omeprazole (PRILOSEC) 20 MG capsule Take 20 mg by mouth daily.  Marland Kitchen oxyCODONE-acetaminophen (PERCOCET) 10-325 MG tablet Take 1 tablet by mouth every 6 (six) hours as needed for pain.  Marland Kitchen oxyCODONE-acetaminophen (PERCOCET) 10-325 MG tablet Take 1 tablet by mouth every 6 (six) hours as needed for pain.  Marland Kitchen oxyCODONE-acetaminophen (PERCOCET) 10-325 MG tablet Take 1 tablet by mouth every 6 (six) hours as needed for pain.  . predniSONE (DELTASONE) 5 MG tablet Take 5 mg by mouth daily with breakfast.   . sertraline (ZOLOFT) 50 MG tablet Take 50 mg by mouth daily.  . insulin lispro (HUMALOG) 100 UNIT/ML KiwkPen Inject 18 Units into the skin 2 (two) times daily.    Current Facility-Administered Medications on File Prior to Visit  Medication  . lidocaine (PF) (XYLOCAINE) 1 % injection 10 mL    ROS  Constitutional: Afebrile, no chills, well hydrated and well nourished Gastrointestinal: No upper or lower GI bleeding, no nausea, no vomiting and no acute GI distress Musculoskeletal: No acute joint swelling or redness, no acute loss of range of motion and no acute onset weakness Neurological: Denies any acute onset apraxia, no episodes of paralysis, no acute loss of coordination, no acute loss of consciousness and no acute onset aphasia, dysarthria, agnosia, or amnesia  Allergies  Douglas Andrews has No Known Allergies.  PFSH  Medical:  Douglas Andrews  has a past medical history of Diabetes mellitus without complication (HCC); Hyperlipidemia; Hypertension; Chronic back pain; Sarcoidosis (HCC); Right leg pain (07/15/2015); and Left leg pain (01/02/2014). Family: family history includes  Diabetes in his father; Heart disease in his father and mother; Stroke in his  father. Surgical:  has past surgical history that includes Appendectomy; Tonsillectomy; and left rod. Tobacco:  reports that he has never smoked. He has never used smokeless tobacco. Alcohol:  reports that he does not drink alcohol. Drug:  reports that he does not use illicit drugs.  Physical Examination  Constitutional Vitals:  Today's Vitals   01/18/16 0917 01/18/16 0919  BP: 148/95   Pulse: 72   Temp: 97.9 F (36.6 C)   TempSrc: Oral   Resp: 18   Height:  (1.727 m)   Weight: 180 lb (81.647 kg)   SpO2: 99%   PainSc: 0-No pain 0-No pain  PainLoc: Knee     Calculated BMI: Body mass index is 27.38 kg/(m^2).    General appearance: alert, cooperative, appears stated age and no distress Eyes: PERLA Respiratory: No evidence respiratory distress, no audible rales or ronchi and no use of accessory muscles of respiration  Cervical Spine Exam  Inspection: Normal anatomy, no anomalies observed Cervical Lordosis: Normal Alignment: Symetrical Functional ROM: Within functional limits (WFL) AROM: WFL Sensory: No sensory abnormalities reported  Upper Extremity Exam    Right  Left  Inspection: No gross anomalies detected  Inspection: No gross anomalies detected  Functional ROM: Adequate  Functional ROM: Adequate  AROM: Adequate  AROM: Adequate  Sensory: Normal  No sensory abnormalities reported  Sensory: Normal  No sensory abnormalities reported  Motor: Unremarkable  Motor: Unremarkable  Vascular: Normal skin color, temperature, and hair growth. No peripheral edema or cyanosis  Vascular: Normal skin color, temperature, and hair growth. No peripheral edema or cyanosis   Thoracic Spine  Inspection: No gross anomalies detected Alignment: Symetrical Functional ROM: Within functional limits [facility]) AROM: Adequate Palpation: WNL  Lumbar Spine  Inspection: No gross anomalies detected Alignment: Symetrical Functional ROM: Within functional limits [facility]) AROM: Adequate Palpation:  WNL Provocative Tests: Lumbar Hyperextension and rotation test: deferred Patrick's Maneuver: deferred  Gait Assessment  Gait: WNL  Lower Extremities    Right  Left  Inspection: No gross anomalies detected  Inspection: No gross anomalies detected  Functional ROM: Within functional limits [facility])  Functional ROM: Within functional limits (WFL)  AROM: Adequate  AROM: Adequate  Sensory: Normal  Sensory: Normal  Motor: Unremarkable  Motor: Unremarkable   Assessment & Plan  Primary Diagnosis & Pertinent Problem List: The primary encounter diagnosis was Chronic low back pain (Location of Primary Source of Pain) (Bilateral) (L>R). Diagnoses of Chronic pain of lower extremity, unspecified laterality, Chronic lumbar radicular pain (Right) (L5 Dermatome), Primary osteoarthritis of right knee, and Chronic knee pain (Location of Tertiary source of pain) (Bilateral) (Right) were also pertinent to this visit.  Visit Diagnosis: 1. Chronic low back pain (Location of Primary Source of Pain) (Bilateral) (L>R)   2. Chronic pain of lower extremity, unspecified laterality   3. Chronic lumbar radicular pain (Right) (L5 Dermatome)   4. Primary osteoarthritis of right knee   5. Chronic knee pain (Location of Tertiary source of pain) (Bilateral) (Right)     Problem-specific Plan(s): No problem-specific assessment & plan notes found for this encounter.   Plan of Care   Problem List Items Addressed This Visit      High   Osteoarthritis of knee (Right) (Chronic)   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)   Relevant Orders   LUMBAR EPIDURAL STEROID INJECTION   Chronic lower extremity pain (Location of Secondary source of  pain) (Right) (Chronic)   Chronic low back pain (Location of Primary Source of Pain) (Bilateral) (L>R) - Primary (Chronic)   Chronic knee pain (Location of Tertiary source of pain) (Bilateral) (Right) (Chronic)   Relevant Orders   KNEE INJECTION        Pharmacotherapy (Medications Ordered): No orders of the defined types were placed in this encounter.    Lab-work & Procedure Ordered: Orders Placed This Encounter  Procedures  . LUMBAR EPIDURAL STEROID INJECTION    Standing Status: Standing     Number of Occurrences: 1     Standing Expiration Date: 01/17/2017    Scheduling Instructions:     Side: Left-sided (L4-5)     Sedation: No Sedation.     Timeframe: PRN Procedure. Patient will call to schedule.    Order Specific Question:  Where will this procedure be performed?    Answer:  [facility]  . KNEE INJECTION    Standing Status: Standing     Number of Occurrences: 1     Standing Expiration Date: 01/17/2017    Scheduling Instructions:     Side: Bilateral     Sedation: No Sedation.     Timeframe: PRN Procedure. Patient will call.    Order Specific Question:  Where will this procedure be performed?    Answer:  [facility]    Imaging Ordered: None  Interventional Therapies: Scheduled:  None at this time    Considering:  Hyalgan knee injection series.    PRN Procedures:  Repeat bilateral intra-articular knee injection with local anesthetic and steroids. Repeat left L4-5 lumbar epidural steroid injections under fluoroscopic guidance, without sedation.    Referral(s) or Consult(s): None at this time.  Medications administered during this visit: Douglas Andrews had no medications administered during this visit.  Future Appointments Date Time Provider Department Center  03/30/2016 8:00 AM Delano Metz, MD [facility] None    Primary Care Physician: Douglas Look, MD Location: [facility] Note by: Sydnee Levans Laban Emperor, M.D, DABA, DABAPM, DABPM, DABIPP, FIPP  Pain Score Disclaimer: We use the NRS-11 scale. This is a self-reported, subjective measurement of pain severity with only modest accuracy. It is used primarily to identify changes within a particular patient. It must be  understood that outpatient pain scales are significantly less accurate that those used for research, where they can be applied under ideal controlled circumstances with minimal exposure to variables. In reality, the score is likely to be a combination of pain intensity and pain affect, where pain affect describes the degree of emotional arousal or changes in action readiness caused by the sensory experience of pain. Factors such as social and work situation, setting, emotional state, anxiety levels, expectation, and prior pain experience may influence pain perception and show large inter-individual differences that may also be affected by time variables.

## 2016-03-30 ENCOUNTER — Other Ambulatory Visit
Admission: RE | Admit: 2016-03-30 | Discharge: 2016-03-30 | Disposition: A | Discharge status: Home / Self Care | Payer: Medicare Other | Source: Ambulatory Visit | Attending: Pain Medicine | Admitting: Pain Medicine

## 2016-03-30 ENCOUNTER — Ambulatory Visit: Payer: Medicare Other | Attending: Pain Medicine | Admitting: Pain Medicine

## 2016-03-30 ENCOUNTER — Encounter: Payer: Self-pay | Admitting: Pain Medicine

## 2016-03-30 VITALS — BP 180/104 | Temp 98.0°F | Resp 17 | Ht 68.0 in | Wt 180.0 lb

## 2016-03-30 DIAGNOSIS — Z794 Long term (current) use of insulin: Secondary | ICD-10-CM | POA: Insufficient documentation

## 2016-03-30 DIAGNOSIS — I10 Essential (primary) hypertension: Secondary | ICD-10-CM | POA: Insufficient documentation

## 2016-03-30 DIAGNOSIS — M5416 Radiculopathy, lumbar region: Secondary | ICD-10-CM

## 2016-03-30 DIAGNOSIS — D869 Sarcoidosis, unspecified: Secondary | ICD-10-CM | POA: Insufficient documentation

## 2016-03-30 DIAGNOSIS — M898X5 Other specified disorders of bone, thigh: Secondary | ICD-10-CM | POA: Insufficient documentation

## 2016-03-30 DIAGNOSIS — M25551 Pain in right hip: Secondary | ICD-10-CM

## 2016-03-30 DIAGNOSIS — Z7952 Long term (current) use of systemic steroids: Secondary | ICD-10-CM | POA: Diagnosis not present

## 2016-03-30 DIAGNOSIS — M7060 Trochanteric bursitis, unspecified hip: Secondary | ICD-10-CM | POA: Diagnosis not present

## 2016-03-30 DIAGNOSIS — M545 Low back pain, unspecified: Secondary | ICD-10-CM

## 2016-03-30 DIAGNOSIS — M25562 Pain in left knee: Secondary | ICD-10-CM

## 2016-03-30 DIAGNOSIS — Z5181 Encounter for therapeutic drug level monitoring: Secondary | ICD-10-CM | POA: Diagnosis not present

## 2016-03-30 DIAGNOSIS — M79604 Pain in right leg: Secondary | ICD-10-CM

## 2016-03-30 DIAGNOSIS — M25561 Pain in right knee: Secondary | ICD-10-CM | POA: Insufficient documentation

## 2016-03-30 DIAGNOSIS — G8929 Other chronic pain: Secondary | ICD-10-CM | POA: Diagnosis not present

## 2016-03-30 DIAGNOSIS — M170 Bilateral primary osteoarthritis of knee: Secondary | ICD-10-CM | POA: Diagnosis not present

## 2016-03-30 DIAGNOSIS — M4726 Other spondylosis with radiculopathy, lumbar region: Secondary | ICD-10-CM

## 2016-03-30 DIAGNOSIS — M549 Dorsalgia, unspecified: Secondary | ICD-10-CM | POA: Diagnosis present

## 2016-03-30 DIAGNOSIS — M792 Neuralgia and neuritis, unspecified: Secondary | ICD-10-CM

## 2016-03-30 DIAGNOSIS — M47896 Other spondylosis, lumbar region: Secondary | ICD-10-CM | POA: Diagnosis not present

## 2016-03-30 DIAGNOSIS — M79601 Pain in right arm: Secondary | ICD-10-CM

## 2016-03-30 DIAGNOSIS — M899 Disorder of bone, unspecified: Secondary | ICD-10-CM

## 2016-03-30 LAB — COMPREHENSIVE METABOLIC PANEL
ALBUMIN: 4.6 g/dL (ref 3.5–5.0)
ALK PHOS: 63 U/L (ref 38–126)
ALT: 27 U/L (ref 17–63)
ANION GAP: 11 (ref 5–15)
AST: 24 U/L (ref 15–41)
BILIRUBIN TOTAL: 0.6 mg/dL (ref 0.3–1.2)
BUN: 12 mg/dL (ref 6–20)
CALCIUM: 9.2 mg/dL (ref 8.9–10.3)
CO2: 25 mmol/L (ref 22–32)
Chloride: 100 mmol/L — ABNORMAL LOW (ref 101–111)
Creatinine, Ser: 0.85 mg/dL (ref 0.61–1.24)
GFR calc Af Amer: >60 mL/min (ref >60)
GLUCOSE: 229 mg/dL — AB (ref 65–99)
POTASSIUM: 3.8 mmol/L (ref 3.5–5.1)
Sodium: 136 mmol/L (ref 135–145)
TOTAL PROTEIN: 7.9 g/dL (ref 6.5–8.1)

## 2016-03-30 LAB — SEDIMENTATION RATE: SED RATE: 1 mm/h (ref 0–20)

## 2016-03-30 LAB — MAGNESIUM: Magnesium: 2.2 mg/dL (ref 1.7–2.4)

## 2016-03-30 LAB — VITAMIN B12: Vitamin B-12: 539 pg/mL (ref 180–914)

## 2016-03-30 LAB — C-REACTIVE PROTEIN

## 2016-03-30 MED ORDER — OXYCODONE-ACETAMINOPHEN 10-325 MG PO TABS: 1.0000 | ORAL_TABLET | Freq: Four times a day (QID) | ORAL | Status: DC | PRN

## 2016-03-30 MED ORDER — GABAPENTIN 600 MG PO TABS: 600.0000 mg | ORAL_TABLET | Freq: Three times a day (TID) | ORAL | Status: DC

## 2016-03-30 NOTE — Patient Instructions (Signed)
Instructed to get labwork and xrays done today in the medical mall. 

## 2016-03-30 NOTE — Progress Notes (Signed)
Patient's Name: Douglas Andrews  Patient type: Established  MRN: [id number]  Service setting: Ambulatory outpatient  DOB: 13-Sep-1951  Location: [facility]  DOS: 03/30/2016  Primary Care Physician: Douglas Look, MD  Note by: Douglas Andrews, M.D, DABA, DABAPM, DABPM, DABIPP, FIPP  Referring Physician: No ref. provider found  Specialty: Board-Certified Interventional Pain Management  Last Visit to Pain Management: 01/18/2016   Primary Reason(s) for Visit: Encounter for prescription drug management (Level of risk: moderate) CC: Back Pain   HPI  Douglas Andrews is a 65 y.o. year old, male patient, who returns today as an established patient. He has Chronic low back pain (Location of Primary Source of Pain) (Bilateral) (L>R); Chronic pain syndrome;  Lumbar DDD (degenerative disc disease);  Lumbar Spondylosis; Lumbar radicular pain (Right) (L5 Dermatome); Lumbar facet syndrome (Location of Primary Source of Pain) (Bilateral) (R>L); Opiate use (60 MME/Day); Uncomplicated opioid dependence (HCC); Long term prescription opiate use; Chronic knee pain (Location of Tertiary source of pain) (Bilateral) (Right); Vitamin D insufficiency; Overweight; Bell palsy; CN (constipation); Dermatitis, eczematoid; Diabetes mellitus (HCC); Dysmetria; Essential (primary) hypertension; Anxiety, generalized; Insomnia due to medical condition; Long term current use of systemic steroids; Type 2 diabetes mellitus (HCC); Pulmonary sarcoidosis (HCC); Arthropathy, traumatic, shoulder; Bursitis, trochanteric; Esophageal varices (HCC); Leg varices; Hepatic cirrhosis (HCC); Major depressive disorder with single episode (HCC); Other long term (current) drug therapy; At risk for falling; Encounter for chronic pain management; Encounter for therapeutic drug level monitoring; Chronic lower extremity pain (Location of Secondary source of pain) (Right); Chronic lumbar radicular pain (Right) (L5 Dermatome); Lumbar  Levoscoliosis; Chronic pain; Neurogenic pain; Osteoarthritis of knee (Right); At high risk for falls; Chest pain; Skin lesion; Pain of left femur (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 His primarily concern today is the Back Pain   Pain Assessment: Self-Reported Pain Score: 7 , clinically he looks like a 2/10. Reported level is inconsistent with clinical obrservations Information on the proper use of the pain score provided to the patient today. Pain Type: Chronic pain Pain Location: Back Pain Orientation: Lower Pain Descriptors / Indicators: Aching, Constant Pain Frequency: Constant  The patient comes into the clinics today for pharmacological management of his chronic pain. I last saw this patient on 01/18/2016. The patient  reports that he does not use illicit drugs. His body mass index is 27.38 kg/(m^2).   The patient indicates having fallen 2 weeks ago when his right leg gave out. The pain goes from his right lower back all the way down to the anterior aspect of the right knee in what appears to be an L3 dermatomal distribution.  Date of Last Visit: 01/18/16 Service Provided on Last Visit: Evaluation  Controlled Substance Pharmacotherapy Assessment & REMS (Risk Evaluation and Mitigation Strategy)  Analgesic: Oxycodone/APAP 10/325 one every 6 hours (40 mg/day) Pill Count: Oxycodone pill count 0/120 Filled 02-27-16 Took last one yesterday MME/day: 60 mg/day Pharmacokinetics: Onset of action (Liberation/Absorption): Within expected pharmacological parameters Time to Peak effect (Distribution): Timing and results are as within normal expected parameters Duration of action (Metabolism/Excretion): Within normal limits for medication Pharmacodynamics: Analgesic Effect: More than 50% Activity Facilitation: Medication(s) allow patient to sit, stand, walk, and do the basic ADLs Perceived Effectiveness:  Described as relatively effective, allowing for increase in activities of daily living (ADL) Side-effects or Adverse reactions: None reported Monitoring: [place] PMP: Online review of the past 8-month period conducted. Compliant  with practice rules and regulations Last UDS on record: TOXASSURE SELECT 13  Date Value Ref Range Status  11/19/2015 FINAL  Final    Comment:    ==================================================================== TOXASSURE SELECT 13 (MW) ==================================================================== Specimen Alert Note:  Urinary creatinine is low; ability to detect some drugs may be compromised.  Interpret results with caution. ==================================================================== Test                             Result       Flag       Units Drug Present and Declared for Prescription Verification   Morphine                       10280        EXPECTED   ng/mg creat    Potential sources of morphine include administration of codeine    or morphine, use of heroin, or ingestion of poppy seeds. Drug Absent but Declared for Prescription Verification   Oxycodone                      Not Detected UNEXPECTED ng/mg creat ==================================================================== Test                      Result    Flag   Units      Ref Range   Creatinine              15        L      mg/dL      >=16 ==================================================================== Declared Medications:  The flagging and interpretation on this report are based on the  following declared medications.  Unexpected results may arise from  inaccuracies in the declared medications.  **Note: The testing scope of this panel includes these medications:  Morphine  Oxycodone  **Note: The testing scope of this panel does not include following  reported medications:  Gabapentin (Neurontin) ==================================================================== For  clinical consultation, please call [phone]. ====================================================================    UDS interpretation: Compliant          Medication Assessment Form: Reviewed. Patient indicates being compliant with therapy Treatment compliance: Compliant Risk Assessment: Aberrant Behavior: None observed today Substance Use Disorder (SUD) Risk Level: Low Risk of opioid abuse or dependence: 0.7-3.0% with doses ? 36 MME/day and 6.1-26% with doses ? 120 MME/day. Opioid Risk Tool (ORT) Score: Total Score: 0 Low Risk for SUD (Score <3) Depression Scale Score: PHQ-2: PHQ-2 Total Score: 0 No depression (0) PHQ-9: PHQ-9 Total Score: 0 No depression (0-4)  Pharmacologic Plan: No change in therapy, at this time  Laboratory Chemistry  Inflammation Markers No results found for: ESRSEDRATE, CRP  Renal Function No results found for: BUN, CREATININE, GFRAA, GFRNONAA  Hepatic Function No results found for: AST, ALT, ALBUMIN  Electrolytes No results found for: NA, K, CL, CALCIUM, MG  Pain Modulating Vitamins No results found for: VD25OH, [id number], AO1308MV7, [id number], VITAMINB12  Coagulation Parameters No results found for: INR, LABPROT, APTT, PLT  Note: Labs Reviewed.  Recent Diagnostic Imaging  None found.   Meds  The patient has a current medication list which includes the following prescription(s): albuterol, albuterol, aspirin ec, atorvastatin, vitamin d3, gabapentin, glucose blood, hydroxyzine, ibuprofen-diphenhydramine cit, insulin glargine, insulin lispro, insulin pen needle, insulin pen needle, isosorbide mononitrate, lisinopril, metformin, nitroglycerin, omeprazole, oxycodone-acetaminophen, oxycodone-acetaminophen, oxycodone-acetaminophen, prednisone, sertraline, insulin lispro, and insulin lispro.  Current Outpatient Prescriptions on File  Prior to Visit  Medication Sig  . albuterol (PROAIR HFA) 108 (90 Base) MCG/ACT inhaler INHALE 1-2 PUFFS BY MOUTH  AS NEEDED  . aspirin EC 81 MG tablet Take 81 mg by mouth daily.  Marland Kitchen atorvastatin (LIPITOR) 40 MG tablet Take 40 mg by mouth daily.  . Cholecalciferol (VITAMIN D3) 5000 units TABS Take 1 tablet by mouth daily.   Marland Kitchen glucose blood (ACCU-CHEK ACTIVE STRIPS) test strip Checks 3 times daily  . hydrOXYzine (VISTARIL) 25 MG capsule Take 50 mg by mouth.  . Ibuprofen-Diphenhydramine Cit (ADVIL PM PO) Take 1 tablet by mouth at bedtime.  . insulin glargine (LANTUS) 100 UNIT/ML injection Inject 60 Units into the skin at bedtime.   . insulin lispro (HUMALOG) 100 UNIT/ML KiwkPen Inject 25 Units into the skin 2 (two) times daily.   . Insulin Pen Needle (B-D ULTRAFINE III SHORT PEN) 31G X 8 MM MISC Reported on 10/07/2015  . lisinopril (PRINIVIL,ZESTRIL) 5 MG tablet Take 5 mg by mouth daily.  . metFORMIN (GLUCOPHAGE) 500 MG tablet Take 1,000 mg by mouth 2 (two) times daily with a meal.  . nitroGLYCERIN (NITROSTAT) 0.4 MG SL tablet Place 0.4 mg under the tongue every 5 (five) minutes as needed for chest pain.  Marland Kitchen omeprazole (PRILOSEC) 20 MG capsule Take 20 mg by mouth daily.  . predniSONE (DELTASONE) 5 MG tablet Take 10 mg by mouth daily with breakfast.   . sertraline (ZOLOFT) 50 MG tablet Take 50 mg by mouth daily.  . insulin lispro (HUMALOG) 100 UNIT/ML KiwkPen Inject 25 Units into the skin 2 (two) times daily.    No current facility-administered medications on file prior to visit.    ROS  Constitutional: Denies any fever or chills Gastrointestinal: No reported hemesis, hematochezia, vomiting, or acute GI distress Musculoskeletal: Denies any acute onset joint swelling, redness, loss of ROM, or weakness Neurological: No reported episodes of acute onset apraxia, aphasia, dysarthria, agnosia, amnesia, paralysis, loss of coordination, or loss of consciousness  Allergies  Mr. Fallin has No Known Allergies.  PFSH  Medical:  Mr. Murtagh  has a past medical history of Diabetes mellitus without complication (HCC);  Hyperlipidemia; Hypertension; Chronic back pain; Sarcoidosis (HCC); Right leg pain (07/15/2015); and Left leg pain (01/02/2014). Family: family history includes Diabetes in his father; Heart disease in his father and mother; Stroke in his father. Surgical:  has past surgical history that includes Appendectomy; Tonsillectomy; and left rod. Tobacco:  reports that he has never smoked. He has never used smokeless tobacco. Alcohol:  reports that he does not drink alcohol. Drug:  reports that he does not use illicit drugs.  Constitutional Exam  Vitals: Blood pressure 180/104, temperature 98 F (36.7 C), resp. rate 17, height 5\' 8"  (1.727 m), weight 180 lb (81.647 kg), SpO2 98 %. General appearance: Well nourished, well developed, and well hydrated. In no acute distress Calculated BMI/Body habitus: Body mass index is 27.38 kg/(m^2).       Psych/Mental status: Alert and oriented x 3 (person, place, & time) Eyes: PERLA Respiratory: No evidence of acute respiratory distress  Cervical Spine Exam  Inspection: No masses, redness, or swelling Alignment: Symmetrical ROM: Functional: ROM is within functional limits [facility]) Stability: No instability detected Muscle strength & Tone: Functionally intact Sensory: Unimpaired Palpation: No complaints of tenderness  Upper Extremity (UE) Exam    Side: Right upper extremity  Side: Left upper extremity  Inspection: No masses, redness, swelling, or asymmetry  Inspection: No masses, redness, swelling, or asymmetry  ROM:  ROM:  Functional: ROM is within functional limits [facility])  Functional: ROM is within functional limits [facility])  Muscle strength & Tone: Functionally intact  Muscle strength & Tone: Functionally intact  Sensory: Unimpaired  Sensory: Unimpaired  Palpation: Non-contributory  Palpation: Non-contributory   Thoracic Spine Exam  Inspection: No masses, redness, or swelling Alignment: Symmetrical ROM: Functional: ROM is within functional limits  [facility]) Stability: No instability detected Sensory: Unimpaired Muscle strength & Tone: Functionally intact Palpation: No complaints of tenderness  Lumbar Spine Exam  Inspection: No masses, redness, or swelling Alignment: Symmetrical ROM: Functional: ROM is within functional limits [facility]) Stability: No instability detected Muscle strength & Tone: Functionally intact Sensory: Unimpaired Palpation: No complaints of tenderness Provocative Tests: Lumbar Hyperextension and rotation test: deferred Patrick's Maneuver: deferred  Gait & Posture Assessment  Ambulation: Unassisted Gait: Unaffected Posture: WNL  Lower Extremity Exam    Side: Right lower extremity  Side: Left lower extremity  Inspection: No masses, redness, swelling, or asymmetry ROM:  Inspection: No masses, redness, swelling, or asymmetry ROM:  Functional: ROM is within functional limits [facility])  Functional: ROM is within functional limits [facility])  Muscle strength & Tone: Functionally intact  Muscle strength & Tone: Functionally intact  Sensory: Unimpaired  Sensory: Unimpaired  Palpation: Non-contributory  Palpation: Non-contributory   Assessment & Plan  Primary Diagnosis & Pertinent Problem List: The primary encounter diagnosis was Chronic pain. Diagnoses of Long term current use of systemic steroids, Encounter for therapeutic drug level monitoring, Chronic low back pain (Location of Primary Source of Pain) (Bilateral) (L>R), Chronic pain of lower extremity, right, Chronic knee pain (Location of Tertiary source of pain) (Bilateral) (Right), Chronic lumbar radicular pain (Right) (L5 Dermatome), Neurogenic pain,  Lumbar Spondylosis, Trochanteric bursitis, unspecified laterality, Pain of left femur (left femoral rod due to sarcoidosis), Chronic hip pain (Right), and Primary osteoarthritis of both knees were also pertinent to this visit.  Visit Diagnosis: 1. Chronic pain   2. Long term current use of systemic steroids   3.  Encounter for therapeutic drug level monitoring   4. Chronic low back pain (Location of Primary Source of Pain) (Bilateral) (L>R)   5. Chronic pain of lower extremity, right   6. Chronic knee pain (Location of Tertiary source of pain) (Bilateral) (Right)   7. Chronic lumbar radicular pain (Right) (L5 Dermatome)   8. Neurogenic pain   9.  Lumbar Spondylosis   10. Trochanteric bursitis, unspecified laterality   11. Pain of left femur (left femoral rod due to sarcoidosis)   12. Chronic hip pain (Right)   13. Primary osteoarthritis of both knees     Problems updated and reviewed during this visit: Problem  Pain of left femur (left femoral rod due to sarcoidosis)  Chronic hip pain (Right)  Osteoarthritis of knee (Bilateral) (R>L)  At Risk for Falling  Pulmonary Sarcoidosis (Hcc)   Last Assessment & Plan:  Pt is taking 10 mg prednisone daily. He had self reduced prednisone from 10 mg to 5 mg daily and was asymptomatic for a time, but increased back to 10 mg daily last week; his breathing is significantly improved since he went back to 10 mg daily.     Albuterol 90 mcg/actuation inhaler 2 puffs every 4 hrs PRN  Albuterol sulfate 2.5 mg/0.5 mL nebulizer solution every 6 hours PRN  Cetirizine 10 mg daily  Hydroxizine 50 mg up to QID PRN with meals  10 mg prednisone QD  Last Assessment & Plan:  Pt is taking his 5 mg  prednisone daily. He has self reduce in prednisone from 10 mg to 5 mg daily and is currently asymptomatic.   He should continue his regimen as discussed with his pulmonologist- reviewed documentation from recent pulm visit.    Albuterol 90 mcg/actuation inhaler 2 puffs every 4 hrs PRN  Albuterol sulfate 2.5 mg/0.5 mL nebulizer solution every 6 hours PRN  Cetirizine 10 mg daily  Hydroxizine 50 mg up to QID PRN with meals  10 mg prednisone QD     Last Assessment & Plan:  Continue current regimen.   Type 2 Diabetes Mellitus (Hcc)   Last Assessment & Plan:  See  poorly controlled diabetes mellitus tab. Last Assessment & Plan:  See Diabetes mellitus with complication tab. Last Assessment & Plan:  Formatting of this note may be different from the original. Diabetes isn't at goal of A1C <8.0, but is fairly stable. Goal is to avoid extreme highs and lows and keep sugar in 70-300 range.  Lab Results  Component Value Date   A1C 9.0* 01/08/2016   A1C 8.9* 11/09/2015   A1C 8.9* 09/29/2015   A1C 8.9* 02/13/2014   A1C 7.9 11/06/2012   A1C 7.5 08/03/2012   A1C 7.8 04/09/2012   MICROALBQTUR <0.6 10/28/2014   Pt is tolerating current medication(s) well. Medication:  Take 18 U Humalog and 500 mg metformin, then eat breakfast around 7-7:30 AM.  Take 500 mg metformin with a snack such as crackers or an apple with peanut butter around 11 AM.  Take 18 U Humalog and 1000 mg metformin around 5-6; then eat dinner.   Take lantus at 54 units nightly up from 52 units patient had been taking.   Continue short acting insulin sliding scale as follows: 150-200 take 18 U, no need to recheck until next meal 201-250 take 19 Units no need to recheck until next meal 251-300 take 20 no need to recheck until next meal  If your sugar is  >300  take 21 units  Recheck sugar in 1-2 hours   201-250 take 4 251-300 take 5 300- 399 take 6 units 400-499- take 8 units >500-- take 10 units   If sugar higher than 500  recheck in 2 hours   201-250 take 4 251-300 take 5 300- 399 take 6 units 400-499- take 8 units >500-- take 10 units   Plan: Follow medication adjustments as above.  Eat a meal 3 times a day. Continue to follow with dietician and make healthy choices. Applauded Pt giving up [employer].  Cautioned Pt against going to grocery store hungry.  Health maintenance: Last foot exam: 06/22/15 Last retinal exam: 06/05/15 Last flu vaccine: 06/22/15 Last Assessment & Plan:  Formatting of this note may be different from the original. Diabetes isn't at goal of A1C  <7.0.  Lab Results  Component Value Date   A1C 9.5* 04/17/2015   A1C 8.9* 02/13/2014   A1C 7.9 11/06/2012   Pt is tolerating current medication(s) well. Medication: change medication from lantus 32 u  to 34 U. And novolog from 12 U to 14 U BID AC Sent message to community pharmacist Pt has appointment in 2 weeks with nutrition Continue night time snack, which has curbed the AM sugars down significantly Plan: Orders and follow up as documented in patient record. Specific diabetic recommendations: diabetic diet discussed in detail, written exchange diet given, home glucose monitoring emphasized and bring glucose log to next visit.    Diabetes Mellitus (Hcc)   Last Assessment & Plan:  Formatting  of this note may be different from the original. Diabetes isn't at goal of A1C <8.0, but is fairly stable. Goal is to avoid extreme highs and lows and keep sugar in 70-300 range. Per log, most sugars since last visit in 200s-370 range with 3 readings in the 90s. Lab Results  Component Value Date   A1C 8.9* 03/15/2016   A1C 9.0* 02/08/2016   A1C 9.0* 01/08/2016   A1C 8.9* 02/13/2014   A1C 7.9 11/06/2012   A1C 7.5 08/03/2012   A1C 7.8 04/09/2012   MICROALBQTUR <0.6 10/28/2014   Pt is tolerating current medication(s) well. Medication: Pt has increased daily prednisone (10 mg daily) and will be having additional steroid injections this week.   Take 25 U Humalog (up from 20 U) and 500 mg metformin, then eat breakfast around 7-7:30 AM.  Take 500 mg metformin with a snack such as crackers or an apple with peanut butter around 11 AM.  Take 25 U Humalog (up from 20 U) and 1000 mg metformin around 5-6; then eat dinner.   Continue lantus at 60 units nightly.   Continue short acting insulin sliding scale as follows: 150-200 take 18 U, no need to recheck until next meal 201-250 take 19 Units no need to recheck until next meal 251-300 take 20 no need to recheck until next meal  If your sugar is   >300  take 21 units  Recheck sugar in 1-2 hours   201-250 take 4 251-300 take 5 300- 399 take 6 units 400-499- take 8 units >500-- take 10 units   If sugar higher than 500  recheck in 2 hours   201-250 take 4 251-300 take 5 300- 399 take 6 units 400-499- take 8 units >500-- take 10 units   Plan:   Follow medication adjustments as above.   Eat a meal 3 times a day.  Continue to follow with dietician and make healthy choices.   Pt was previously referred Pt to Pharm D Kathee Polite and actively works with community pharmacist  Referred to endocrinology Health maintenance: Last foot exam: 06/22/15 Last retinal exam: 06/05/15 Last flu vaccine: 06/22/15 Last Assessment & Plan:  Formatting of this note may be different from the original. Diabetes isn't at goal of A1C <7.0, but per Pt's BG log (see scan in media) is improving. FBG range 180s-300s; postparandials usually in the 200s.  Lab Results  Component Value Date   A1C 9.3* 07/20/2015   A1C 8.3* 06/22/2015   A1C 9.0* 05/19/2015   A1C 8.9* 02/13/2014   A1C 7.9 11/06/2012   A1C 7.5 08/03/2012   A1C 7.8 04/09/2012   MICROALBQTUR <0.6 10/28/2014  Pt is tolerating current medication(s) well. Medication:  Increase lantus from 40 units nightly to 45 units nightly  Increase preprandial humalog from 16 units to 18 units BID (with breakfast and lunch)  If BG exceeds 400, inject 10 units humalog, recheck in 1-2 hours, if still over 400, inject additional 10 units, wait again; repeat until BG under 400.  Plan: Follow medication adjustments as above.  Attend group sessions for improved BG control as planned. (Dietician put Pt in trial.) Front desk provided Pt with direct number for nurse line.    Health maintenance: Last foot exam: 06/22/15 Last retinal exam: 06/05/15 Last flu vaccine: 06/22/15 Last Assessment & Plan:  Diabetes is cotninue lantus 27 U at night Add 10 U lantus in the am Increase novolog before dinner  )biggest meal= to 12 U (up from 10) Add  SS- if BS > 250- 350 before lunch increase to 12U, If bs >350 increase novolog to 14 U  Follow up in 1 month   Essential (Primary) Hypertension   Last Assessment & Plan:  Formatting of this note may be different from the original. Today's BP: 145/89 mmHg is not at goal of <140/90. Hypertension is asymptomatic and reasonably well controlled. Pt is tolerating current medication(s) well. Medication: continue current medication at same dose.  Lisinopril 5mg  QD Plan: Orders and follow up as documented in patient record. Continue to monitor. Patient previously was asked to report if BP consistently >140/90. Pt has appointment with cardiology on 5/4. Lab Results  Component Value Date   NA 134* 01/20/2015   K 4.3 01/20/2015   CREATININE 0.84 01/20/2015   BP Readings from Last 4 Encounters:  02/08/16 145/89  01/08/16 138/86  11/23/15 152/77  11/09/15 158/82  Overview:  HTN  Last Assessment & Plan:  Formatting of this note may be different from the original. Today's BP: 138/86 mmHg is not at goal of <130/80. Hypertension is asymptomatic and reasonably well controlled, at his last appointment, Pt noted that his home BPs are normally in the 130s/80s.  Pt is tolerating current medication(s) well. Medication: continue current medication at same dose.  Lisinopril 5mg  QD Plan: Orders and follow up as documented in patient record. Continue to monitor. Patient previously was asked to report if BP consistently >140/90. Lab Results  Component Value Date   NA 134* 01/20/2015   K 4.3 01/20/2015   CREATININE 0.84 01/20/2015   BP Readings from Last 4 Encounters:  01/08/16 138/86  11/23/15 152/77  11/09/15 158/82  10/13/15 138/86  Overview:  HTN  Last Assessment & Plan:  Today's BP: 130/80 mmHg is at goal. Hypertension is well controlled.  Pt is tolerating current medication(s) well. Medication: continue current medication at same dose.   Lisinopril 5mg  QD Plan: Current treatment plan is effective, no change in therapy. Overview:  HTN  Last Assessment & Plan:  Formatting of this note may be different from the original. Today's BP: 125/82 mmHg (manual) is at goal. Hypertension is well controlled.  Pt is tolerating current medication(s) well. Medication: continue current medicaiton(s) at same dose.  Lisinopril 5 mg daily Plan: Current treatment plan is effective, no change in therapy. Lab Results  Component Value Date   NA 134* 01/20/2015   K 4.3 01/20/2015   CREATININE 0.84 01/20/2015   BP Readings from Last 4 Encounters:  06/22/15 125/82  05/19/15 149/91  04/17/15 128/80  03/23/15 138/92      Problem-specific Plan(s): No problem-specific assessment & plan notes found for this encounter.  No new assessment & plan notes have been filed under this hospital service since the last note was generated. Service: Pain Management   Plan of Care   Problem List Items Addressed This Visit      High    Lumbar Spondylosis (Chronic)   Relevant Medications   oxyCODONE-acetaminophen (PERCOCET) 10-325 MG tablet   oxyCODONE-acetaminophen (PERCOCET) 10-325 MG tablet   oxyCODONE-acetaminophen (PERCOCET) 10-325 MG tablet   gabapentin (NEURONTIN) 600 MG tablet   Other Relevant Orders   DG Lumbar Spine Complete W/Bend   Bursitis, trochanteric   Relevant Orders   DG HIP UNILAT W OR W/O PELVIS 2-3 VIEWS RIGHT   Chronic hip pain (Right) (Chronic)   Relevant Orders   HIP INJECTION   Chronic knee pain (Location of Tertiary source of pain) (Bilateral) (Right) (Chronic)   Relevant Medications  oxyCODONE-acetaminophen (PERCOCET) 10-325 MG tablet   oxyCODONE-acetaminophen (PERCOCET) 10-325 MG tablet   oxyCODONE-acetaminophen (PERCOCET) 10-325 MG tablet   gabapentin (NEURONTIN) 600 MG tablet   Other Relevant Orders   DG Knee 1-2 Views Left   DG Knee 1-2 Views Right   KNEE INJECTION   Chronic low back pain (Location of  Primary Source of Pain) (Bilateral) (L>R) (Chronic)   Relevant Medications   oxyCODONE-acetaminophen (PERCOCET) 10-325 MG tablet   oxyCODONE-acetaminophen (PERCOCET) 10-325 MG tablet   oxyCODONE-acetaminophen (PERCOCET) 10-325 MG tablet   Other Relevant Orders   DG Lumbar Spine Complete W/Bend   LUMBAR FACET(MEDIAL BRANCH NERVE BLOCK) MBNB   Chronic lower extremity pain (Location of Secondary source of pain) (Right) (Chronic)   Relevant Orders   MR Lumbar Spine Wo Contrast   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)   Relevant Orders   DG Lumbar Spine Complete W/Bend   MR Lumbar Spine Wo Contrast   LUMBAR EPIDURAL STEROID INJECTION   Chronic pain - Primary (Chronic)   Relevant Medications   oxyCODONE-acetaminophen (PERCOCET) 10-325 MG tablet   oxyCODONE-acetaminophen (PERCOCET) 10-325 MG tablet   oxyCODONE-acetaminophen (PERCOCET) 10-325 MG tablet   gabapentin (NEURONTIN) 600 MG tablet   Other Relevant Orders   Comprehensive metabolic panel   C-reactive protein   Magnesium   Sedimentation rate   Vitamin B12   25-Hydroxyvitamin D Lcms D2+D3   Neurogenic pain (Chronic)   Relevant Medications   gabapentin (NEURONTIN) 600 MG tablet   Osteoarthritis of knee (Bilateral) (R>L) (Chronic)   Relevant Medications   oxyCODONE-acetaminophen (PERCOCET) 10-325 MG tablet   oxyCODONE-acetaminophen (PERCOCET) 10-325 MG tablet   oxyCODONE-acetaminophen (PERCOCET) 10-325 MG tablet   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 oxyCODONE-acetaminophen (PERCOCET) 10-325 MG tablet    Sig: Take 1 tablet by mouth every 6 (six) hours as needed for pain.    Dispense:  120 tablet    Refill:  0     Do not place this medication, or any other prescription from our practice, on "Automatic Refill". Patient may have prescription filled one day early if pharmacy is closed on scheduled refill date. Do not fill until: 03/30/16 To last until: 04/29/16  . oxyCODONE-acetaminophen (PERCOCET) 10-325 MG tablet    Sig: Take 1 tablet by mouth every 6 (six) hours as needed for pain.    Dispense:  120 tablet    Refill:  0    Do not place this medication, or any other prescription from our practice, on "Automatic Refill". Patient may have prescription filled one day early if pharmacy is closed on scheduled refill date. Do not fill until: 04/29/16 To last until: 05/29/16  . oxyCODONE-acetaminophen (PERCOCET) 10-325 MG tablet    Sig: Take 1 tablet by mouth every 6 (six) hours as needed for pain.    Dispense:  120 tablet    Refill:  0    Do not place this medication, or any other prescription from our practice, on "Automatic Refill". Patient may have prescription filled one day early if pharmacy is closed on scheduled refill date. Do not fill until: 05/29/16 To last until: 06/28/16  . gabapentin (NEURONTIN) 600 MG tablet  Sig: Take 1 tablet (600 mg total) by mouth every 8 (eight) hours.    Dispense:  90 tablet    Refill:  2    Do not place this medication, or any other prescription from our practice, on "Automatic Refill". Patient may have prescription filled one day early if pharmacy is closed on scheduled refill date.    Lab-work & Procedure Ordered: Orders Placed This Encounter  Procedures  . LUMBAR EPIDURAL STEROID INJECTION  . LUMBAR FACET(MEDIAL BRANCH NERVE BLOCK) MBNB  . HIP INJECTION  . KNEE INJECTION  . DG Lumbar Spine Complete W/Bend  . DG Knee 1-2 Views Left  . DG Knee 1-2 Views Right  . DG HIP UNILAT W OR W/O PELVIS 2-3 VIEWS RIGHT  . MR Lumbar Spine Wo Contrast  . ToxASSURE Select 13 (MW), Urine  . Comprehensive metabolic panel  . C-reactive protein  . Magnesium  .  Sedimentation rate  . Vitamin B12  . 25-Hydroxyvitamin D Lcms D2+D3    Imaging Ordered: DG LUMBAR SPINE COMPLETE W/BEND 6+V DG KNEE 1-2 VIEWS LEFT DG KNEE 1-2 VIEWS RIGHT DG HIP UNILAT W OR W/O PELVIS 2-3 VIEWS RIGHT MR LUMBAR SPINE WO CONTRAST  Interventional Therapies: Scheduled:  None at this time.    Considering:   1. Diagnostic bilateral  Lumbar facet block under fluoroscopic guidance and IV sedation.  2. Possible bilateral lumbar facet radiofrequency ablation, depending on the results of the diagnostic injection.  3. Diagnostic right intra-articular hip injection under fluoroscopic guidance, with a without sedation.  4. Possible radiofrequency of the right hip joint depending on the diagnostic injection.  5. Diagnostic Right L4-5 lumbar epidural steroid injection under fluoroscopic guidance, with or without sedation.  6. Diagnostic bilateral intra-articular knee injection, without fluoroscopic guidance and IV sedation.  7. Possible series of 5 Hyalgan knee injections.  8. Diagnostic bilateral Genicular nerve block under fluoroscopic guidance, with a without sedation.  9. Possible bilateral Genicular nerve radiofrequency ablation depending on the results of the diagnostic injection.    PRN Procedures:   1. Diagnostic bilateral  Lumbar facet block under fluoroscopic guidance and IV sedation.  2. Diagnostic right intra-articular hip injection under fluoroscopic guidance, with a without sedation.  3. Diagnostic Right L4-5 lumbar epidural steroid injection under fluoroscopic guidance, with or without sedation.  4. Diagnostic bilateral intra-articular knee injection, without fluoroscopic guidance and IV sedation.  5. Possible series of 5 Hyalgan knee injections.  6. Diagnostic bilateral Genicular nerve block under fluoroscopic guidance, with a without sedation.    Referral(s) or Consult(s): None at this time.  New Prescriptions   No medications on file    Medications  administered during this visit: [name] had no medications administered during this visit.  Requested PM Follow-up: Return in about 2 months (around 06/13/2016) for Medication Management, (3-Mo).  Future Appointments Date Time Provider Department Center  06/13/2016 8:20 AM Delano MetzFrancisco Courteny Egler, MD [facility] None    Primary Care Physician: Douglas LookFOLEY, STEPHANIE J, MD [facility] Note by: Douglas LevansFrancisco A. Laban EmperorNaveira, M.D, DABA, DABAPM, DABPM, DABIPP, FIPP  Pain Score Disclaimer: We use the NRS-11 scale. This is a self-reported, subjective measurement of pain severity with only modest accuracy. It is used primarily to identify changes within a particular patient. It must be understood that outpatient pain scales are significantly less accurate that those used for research, where they can be applied under ideal controlled circumstances with minimal exposure to variables. In reality, the score is likely to be a  combination of pain intensity and pain affect, where pain affect describes the degree of emotional arousal or changes in action readiness caused by the sensory experience of pain. Factors such as social and work situation, setting, emotional state, anxiety levels, expectation, and prior pain experience may influence pain perception and show large inter-individual differences that may also be affected by time variables.  Patient instructions provided during this appointment: Patient Instructions  Instructed to get labwork and xrays done today in the medical mall.

## 2016-03-30 NOTE — Progress Notes (Signed)
Safety precautions to be maintained throughout the outpatient stay will include: orient to surroundings, keep bed in low position, maintain call bell within reach at all times, provide assistance with transfer out of bed and ambulation. Oxycodone pill count 0/120  Filled 02-27-16  Took last one yesterday

## 2016-03-31 NOTE — Progress Notes (Signed)
Quick Note:   Normal chloride levels are between 95 and 107 mEq/L. Low levels may be due to: Addison disease; Bartter syndrome; burns; congestive heart failure; dehydration; excessive sweating; hyperaldosteronism; metabolic alkalosis; respiratory acidosis (compensated); Syndrome of inappropriate diuretic hormone secretion (SIADH); or vomiting.  Normal fasting (NPO x 8 hours) glucose levels are between 65-99 mg/dl, with 2 hour fasting, levels are usually less than 140 mg/dl. Any random blood glucose level greater than 200 mg/dl is considered to be Diabetes.  ______ 

## 2016-04-02 LAB — 25-HYDROXYVITAMIN D LCMS D2+D3: 25-HYDROXY, VITAMIN D-3: 35 ng/mL

## 2016-04-02 LAB — 25-HYDROXY VITAMIN D LCMS D2+D3
25-Hydroxy, Vitamin D-2: 3.4 ng/mL
25-Hydroxy, Vitamin D: 38 ng/mL

## 2016-04-06 LAB — TOXASSURE SELECT 13 (MW), URINE: PDF: 0

## 2016-05-03 ENCOUNTER — Ambulatory Visit
Admission: RE | Admit: 2016-05-03 | Discharge: 2016-05-03 | Disposition: A | Discharge status: Home / Self Care | Payer: Medicare Other | Source: Ambulatory Visit | Attending: Pain Medicine | Admitting: Pain Medicine

## 2016-05-03 DIAGNOSIS — M79601 Pain in right arm: Secondary | ICD-10-CM | POA: Diagnosis not present

## 2016-05-03 DIAGNOSIS — M79604 Pain in right leg: Secondary | ICD-10-CM | POA: Diagnosis not present

## 2016-05-03 DIAGNOSIS — G8929 Other chronic pain: Secondary | ICD-10-CM | POA: Diagnosis not present

## 2016-05-03 DIAGNOSIS — M5127 Other intervertebral disc displacement, lumbosacral region: Secondary | ICD-10-CM | POA: Diagnosis not present

## 2016-05-03 DIAGNOSIS — M5126 Other intervertebral disc displacement, lumbar region: Secondary | ICD-10-CM | POA: Insufficient documentation

## 2016-05-03 DIAGNOSIS — M5416 Radiculopathy, lumbar region: Secondary | ICD-10-CM | POA: Diagnosis present

## 2016-05-03 NOTE — Progress Notes (Signed)
Results were reviewed and found to be: mildly abnormal    Review would suggest interventional pain management techniques may be of benefit  No acute injury or pathology identified

## 2016-06-06 ENCOUNTER — Telehealth: Payer: Self-pay

## 2016-06-06 NOTE — Telephone Encounter (Signed)
Patient called, left vm to r/s his appt due to jury duty. He left his neighbors phone number and said to leave the message with her. I called her and r/s to [phone] per her instructions.

## 2016-06-13 ENCOUNTER — Encounter: Payer: Medicare Other | Admitting: Pain Medicine

## 2016-06-29 ENCOUNTER — Encounter: Payer: Self-pay | Admitting: Pain Medicine

## 2016-06-29 ENCOUNTER — Ambulatory Visit: Payer: Medicare Other | Attending: Pain Medicine | Admitting: Pain Medicine

## 2016-06-29 VITALS — BP 150/80 | HR 81 | Temp 97.8°F | Resp 22 | Ht 68.0 in | Wt 186.0 lb

## 2016-06-29 DIAGNOSIS — M545 Low back pain: Secondary | ICD-10-CM

## 2016-06-29 DIAGNOSIS — Z823 Family history of stroke: Secondary | ICD-10-CM | POA: Insufficient documentation

## 2016-06-29 DIAGNOSIS — M170 Bilateral primary osteoarthritis of knee: Secondary | ICD-10-CM | POA: Diagnosis not present

## 2016-06-29 DIAGNOSIS — G894 Chronic pain syndrome: Secondary | ICD-10-CM | POA: Diagnosis not present

## 2016-06-29 DIAGNOSIS — M792 Neuralgia and neuritis, unspecified: Secondary | ICD-10-CM

## 2016-06-29 DIAGNOSIS — F1120 Opioid dependence, uncomplicated: Secondary | ICD-10-CM | POA: Diagnosis not present

## 2016-06-29 DIAGNOSIS — K5900 Constipation, unspecified: Secondary | ICD-10-CM | POA: Diagnosis not present

## 2016-06-29 DIAGNOSIS — Z7952 Long term (current) use of systemic steroids: Secondary | ICD-10-CM | POA: Diagnosis not present

## 2016-06-29 DIAGNOSIS — Z833 Family history of diabetes mellitus: Secondary | ICD-10-CM | POA: Insufficient documentation

## 2016-06-29 DIAGNOSIS — E119 Type 2 diabetes mellitus without complications: Secondary | ICD-10-CM | POA: Insufficient documentation

## 2016-06-29 DIAGNOSIS — G510 Bell's palsy: Secondary | ICD-10-CM | POA: Insufficient documentation

## 2016-06-29 DIAGNOSIS — I10 Essential (primary) hypertension: Secondary | ICD-10-CM | POA: Diagnosis not present

## 2016-06-29 DIAGNOSIS — M1711 Unilateral primary osteoarthritis, right knee: Secondary | ICD-10-CM | POA: Diagnosis not present

## 2016-06-29 DIAGNOSIS — E559 Vitamin D deficiency, unspecified: Secondary | ICD-10-CM | POA: Diagnosis not present

## 2016-06-29 DIAGNOSIS — Z7982 Long term (current) use of aspirin: Secondary | ICD-10-CM | POA: Insufficient documentation

## 2016-06-29 DIAGNOSIS — Z794 Long term (current) use of insulin: Secondary | ICD-10-CM | POA: Diagnosis not present

## 2016-06-29 DIAGNOSIS — M25561 Pain in right knee: Secondary | ICD-10-CM

## 2016-06-29 DIAGNOSIS — D860 Sarcoidosis of lung: Secondary | ICD-10-CM | POA: Diagnosis not present

## 2016-06-29 DIAGNOSIS — M25562 Pain in left knee: Secondary | ICD-10-CM

## 2016-06-29 DIAGNOSIS — Z8249 Family history of ischemic heart disease and other diseases of the circulatory system: Secondary | ICD-10-CM | POA: Diagnosis not present

## 2016-06-29 DIAGNOSIS — Z5181 Encounter for therapeutic drug level monitoring: Secondary | ICD-10-CM | POA: Insufficient documentation

## 2016-06-29 DIAGNOSIS — F329 Major depressive disorder, single episode, unspecified: Secondary | ICD-10-CM | POA: Insufficient documentation

## 2016-06-29 DIAGNOSIS — G8929 Other chronic pain: Secondary | ICD-10-CM

## 2016-06-29 DIAGNOSIS — M5116 Intervertebral disc disorders with radiculopathy, lumbar region: Secondary | ICD-10-CM | POA: Diagnosis not present

## 2016-06-29 DIAGNOSIS — F1190 Opioid use, unspecified, uncomplicated: Secondary | ICD-10-CM | POA: Diagnosis not present

## 2016-06-29 DIAGNOSIS — M47816 Spondylosis without myelopathy or radiculopathy, lumbar region: Secondary | ICD-10-CM

## 2016-06-29 MED ORDER — OXYCODONE-ACETAMINOPHEN 10-325 MG PO TABS: 1.0000 | ORAL_TABLET | Freq: Four times a day (QID) | ORAL | 0 refills | Status: DC | PRN

## 2016-06-29 MED ORDER — GABAPENTIN 600 MG PO TABS: 600.0000 mg | ORAL_TABLET | Freq: Three times a day (TID) | ORAL | 0 refills | Status: DC

## 2016-06-29 NOTE — Progress Notes (Signed)
Safety precautions to be maintained throughout the outpatient stay will include: orient to surroundings, keep bed in low position, maintain call bell within reach at all times, provide assistance with transfer out of bed and ambulation.  Bubble pack brought with pills in them.  Patient states his Percocet is the yellow one.  There are #7  There is not labels or anything to identify medications or quantity.  [facility]

## 2016-06-29 NOTE — Progress Notes (Signed)
Patient's Name: Douglas Andrews  MRN: [id number]  Referring Provider: Katharine Look, MD  DOB: 1951-07-18  PCP: Katharine Look, MD  DOS: 06/29/2016  Note by: Sydnee Levans. Laban Emperor, MD  Service setting: Ambulatory outpatient  Specialty: Interventional Pain Management  Location: [facility] (AMB) Pain Management Facility    Patient type: Established   Primary Reason(s) for Visit: Encounter for prescription drug management (Level of risk: moderate) CC: Back Pain (lower left) and Knee Pain (right)  HPI  Douglas Andrews is a 65 y.o. year old, male patient, who comes today for an initial evaluation. He has Chronic low back pain (Location of Primary Source of Pain) (Bilateral) (L>R); Chronic pain syndrome;  Lumbar DDD (degenerative disc disease);  Lumbar Spondylosis; Lumbar radicular pain (Right) (L5 Dermatome); Lumbar facet syndrome (Location of Primary Source of Pain) (Bilateral) (R>L); Opiate use (60 MME/Day); Uncomplicated opioid dependence (HCC); Long term prescription opiate use; Chronic knee pain (Location of Tertiary source of pain) (Bilateral) (Right); Vitamin D insufficiency; Overweight; Bell palsy; CN (constipation); Dermatitis, eczematoid; Diabetes mellitus (HCC); Dysmetria; Essential (primary) hypertension; Anxiety, generalized; Insomnia due to medical condition; Long term current use of systemic steroids; Type 2 diabetes mellitus (HCC); Pulmonary sarcoidosis (HCC); Arthropathy, traumatic, shoulder; Bursitis, trochanteric; Esophageal varices (HCC); Leg varices; Hepatic cirrhosis (HCC); Major depressive disorder with single episode (HCC); Other long term (current) drug therapy; At risk for falling; Encounter for chronic pain management; Encounter for therapeutic drug level monitoring; Chronic lower extremity pain (Location of Secondary source of pain) (Right); Chronic lumbar radicular pain (Right) (L5 Dermatome); Lumbar Levoscoliosis; Chronic pain; Neurogenic pain; Osteoarthritis of knee (Right); At high risk  for falls; Chest pain; Skin lesion; Pain of left femur (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 His primarily concern today is the Back Pain (lower left) and Knee Pain (right)  Pain Assessment: Self-Reported Pain Score: 5 /10 Clinically the patient looks like a 2/10 Reported level is inconsistent with clinical observations. Information on the proper use of the pain score provided to the patient today. Pain Type: Chronic pain Pain Location: Back Pain Orientation: Lower, Left Pain Descriptors / Indicators: Sharp, Radiating Pain Frequency: Constant  The patient comes into the clinics today for pharmacological management of his chronic pain. I last saw this patient on 03/30/2016. The patient  reports that he does not use drugs. His body mass index is 28.28 kg/m.  Date of Last Visit: 03/30/16 Service Provided on Last Visit: Med Refill  Controlled Substance Pharmacotherapy Assessment & REMS (Risk Evaluation and Mitigation Strategy)  Analgesic: Oxycodone/APAP 10/325 one every 6 hours (40 mg/day) MME/day: 60 mg/day Pill Count: Bubble pack brought with pills in them.  Patient states his Percocet is the yellow one.  There are #7  There is not labels or anything to identify medications or quantity.  [employer]. Pharmacokinetics: Onset of action (Liberation/Absorption): Within expected pharmacological parameters Time to Peak effect (Distribution): Timing and results are as within normal expected parameters Duration of action (Metabolism/Excretion): Within normal limits for medication Pharmacodynamics: Analgesic Effect: More than 50% Activity Facilitation: Medication(s) allow patient to sit, stand, walk, and do the basic ADLs Perceived Effectiveness: Described as relatively effective, allowing for increase in activities of daily living (ADL) Side-effects or Adverse reactions: None  reported Monitoring: [place] PMP: Online review of the past 39-month period conducted. Compliant with practice rules and regulations List of all UDS test(s) done:  Lab Results  Component Value Date   TOXASSSELUR  FINAL 03/30/2016   TOXASSSELUR FINAL 11/19/2015   TOXASSSELUR FINAL 10/07/2015   Last UDS on record: ToxAssure Select 13  Date Value Ref Range Status  03/30/2016 FINAL  Final    Comment:    ==================================================================== TOXASSURE SELECT 13 (MW) ==================================================================== Test                             Result       Flag       Units Drug Present and Declared for Prescription Verification   Oxycodone                      578          EXPECTED   ng/mg creat   Oxymorphone                    360          EXPECTED   ng/mg creat   Noroxycodone                   2551         EXPECTED   ng/mg creat   Noroxymorphone                 135          EXPECTED   ng/mg creat    Sources of oxycodone are scheduled prescription medications.    Oxymorphone, noroxycodone, and noroxymorphone are expected    metabolites of oxycodone. Oxymorphone is also available as a    scheduled prescription medication. Drug Present not Declared for Prescription Verification   7-aminoclonazepam              100          UNEXPECTED ng/mg creat    7-aminoclonazepam is an expected metabolite of clonazepam. Source    of clonazepam is a scheduled prescription medication. ==================================================================== Test                      Result    Flag   Units      Ref Range   Creatinine              83               mg/dL      >=40>=20 ==================================================================== Declared Medications:  The flagging and interpretation on this report are based on the  following declared medications.  Unexpected results may arise from  inaccuracies in the declared medications.  **Note: The  testing scope of this panel includes these medications:  Oxycodone (Oxycodone Acetaminophen)  **Note: The testing scope of this panel does not include following  reported medications:  Acetaminophen (Oxycodone Acetaminophen)  Albuterol  Aspirin  Atorvastatin  Cholecalciferol  Diphenhydramine  Gabapentin  Hydroxyzine  Ibuprofen  Insulin (Humalog)  Insulin (Lantus)  Isosorbide Mononitrate  Lisinopril  Metformin  Nitroglycerin  Omeprazole  Prednisone  Sertraline ==================================================================== For clinical consultation, please call [phone]. ====================================================================    UDS interpretation: Compliant Patient informed of the CDC guidelines and recommendations to stay away from the concomitant use of benzodiazepines and opioids due to the increased risk of respiratory depression and death. Medication Assessment Form: Reviewed. Patient indicates being compliant with therapy Treatment compliance: Compliant Risk Assessment: Aberrant Behavior: None observed today Substance Use Disorder (SUD) Risk Level: Low-to-moderate Risk of opioid abuse or dependence: 0.7-3.0% with doses ? 36 MME/day and 6.1-26% with  doses ? 120 MME/day. Opioid Risk Tool (ORT) Score: 0 Low Risk for SUD (Score <3) Depression Scale Score: PHQ-2: 0 No depression (0) PHQ-9: 0 No depression (0-4)  Pharmacologic Plan: No change in therapy, at this time  Laboratory Chemistry  Inflammation Markers Lab Results  Component Value Date   ESRSEDRATE 1 03/30/2016   CRP <0.5 03/30/2016   Renal Function Lab Results  Component Value Date   BUN 12 03/30/2016   CREATININE 0.85 03/30/2016   GFRAA >60 03/30/2016   GFRNONAA >60 03/30/2016   Hepatic Function Lab Results  Component Value Date   AST 24 03/30/2016   ALT 27 03/30/2016   ALBUMIN 4.6 03/30/2016   Electrolytes Lab Results  Component Value Date   NA 136 03/30/2016   K  3.8 03/30/2016   CL 100 (L) 03/30/2016   CALCIUM 9.2 03/30/2016   MG 2.2 03/30/2016   Pain Modulating Vitamins Lab Results  Component Value Date   25OHVITD1 38 03/30/2016   25OHVITD2 3.4 03/30/2016   25OHVITD3 35 03/30/2016   VITAMINB12 539 03/30/2016   Coagulation Parameters No results found for: INR, LABPROT, APTT, PLT Cardiovascular No results found for: BNP, HGB, HCT Note: Lab results reviewed.  Recent Diagnostic Imaging  Mr Lumbar Spine Wo Contrast  Result Date: 05/03/2016 CLINICAL DATA:  Low back pain for over 10 years radiating into the right leg to the knee. No known injury. Initial encounter. EXAM: MRI LUMBAR SPINE WITHOUT CONTRAST TECHNIQUE: Multiplanar, multisequence MR imaging of the lumbar spine was performed. No intravenous contrast was administered. COMPARISON:  None. FINDINGS: Segmentation:  Unremarkable. Alignment: Trace anterolisthesis L5 on S1 due to facet degenerative disease is seen. Vertebrae: No fracture or worrisome marrow lesion. Scattered Schmorl's nodes are noted. Conus medullaris: Extends to the L1 level and appears normal. Paraspinal and other soft tissues: Unremarkable. Disc levels: T12-L1:  Negative. L1-2:  Negative. L2-3:  Negative. L3-4: Small right paracentral disc protrusion causes mild narrowing the right lateral recess. The disc slightly deflects the descending right L4 root without compressing it. The foramina are open. L4-5: Mild ligamentum flavum thickening and facet degenerative disease. Minimal disc bulge without central canal or foraminal stenosis. L5-S1: There is facet degenerative change. The disc is uncovered with a shallow bulge. The central canal and foramina are widely patent. IMPRESSION: Small right side disc protrusion at L3-4 slightly deflects the descending right L4 root without compression. Mild disc bulging L4-5 and L5-S1 without central canal or foraminal stenosis. Electronically Signed   By: Drusilla Kanner M.D.   On: 05/03/2016  09:17   Meds  The patient has a current medication list which includes the following prescription(s): albuterol, albuterol, aspirin ec, atorvastatin, cetirizine, vitamin d3, gabapentin, glucose blood, hydroxyzine, ibuprofen-diphenhydramine cit, insulin glargine, insulin lispro, insulin pen needle, insulin pen needle, insulin pen needle, isosorbide mononitrate, lantus solostar, lisinopril, metformin, nitroglycerin, omeprazole, oxycodone-acetaminophen, oxycodone-acetaminophen, oxycodone-acetaminophen, and prednisone.  Current Outpatient Prescriptions on File Prior to Visit  Medication Sig  . aspirin EC 81 MG tablet Take 81 mg by mouth daily.  Marland Kitchen atorvastatin (LIPITOR) 40 MG tablet Take 40 mg by mouth daily.  . Cholecalciferol (VITAMIN D3) 5000 units TABS Take 1 tablet by mouth daily.   Marland Kitchen glucose blood (ACCU-CHEK ACTIVE STRIPS) test strip Checks 3 times daily  . Ibuprofen-Diphenhydramine Cit (ADVIL PM PO) Take 1 tablet by mouth at bedtime.  . insulin glargine (LANTUS) 100 UNIT/ML injection Inject 60 Units into the skin at bedtime.   . insulin lispro (HUMALOG) 100 UNIT/ML KiwkPen Inject  25 Units into the skin 2 (two) times daily.   . Insulin Pen Needle (B-D ULTRAFINE III SHORT PEN) 31G X 8 MM MISC Reported on 10/07/2015  . Insulin Pen Needle (UNIFINE PENTIPS) 31G X 6 MM MISC   . isosorbide mononitrate (IMDUR) 30 MG 24 hr tablet 30 mg daily.   . metFORMIN (GLUCOPHAGE) 500 MG tablet Take 1,000 mg by mouth 2 (two) times daily with a meal.  . nitroGLYCERIN (NITROSTAT) 0.4 MG SL tablet Place 0.4 mg under the tongue every 5 (five) minutes as needed for chest pain.  Marland Kitchen omeprazole (PRILOSEC) 20 MG capsule Take 20 mg by mouth daily.   No current facility-administered medications on file prior to visit.     ROS  Constitutional: Denies any fever or chills Gastrointestinal: No reported hemesis, hematochezia, vomiting, or acute GI distress Musculoskeletal: Denies any acute onset joint swelling, redness,  loss of ROM, or weakness Neurological: No reported episodes of acute onset apraxia, aphasia, dysarthria, agnosia, amnesia, paralysis, loss of coordination, or loss of consciousness  Allergies  Mr. Perra has No Known Allergies.  PFSH  Medical:  Mr. Deman  has a past medical history of Chronic back pain; Diabetes mellitus without complication (HCC); Hyperlipidemia; Hypertension; Left leg pain (01/02/2014); Right leg pain (07/15/2015); and Sarcoidosis (HCC). Family: family history includes Diabetes in his father; Heart disease in his father and mother; Stroke in his father. Surgical:  has a past surgical history that includes Appendectomy; Tonsillectomy; and left rod. Tobacco:  reports that he has never smoked. He has never used smokeless tobacco. Alcohol:  reports that he does not drink alcohol. Drug:  reports that he does not use drugs.  Constitutional Exam  General appearance: Well nourished, well developed, and well hydrated. In no acute distress Vitals:   06/29/16 1044  BP: (!) 150/80  Pulse: 81  Resp: (!) 22  Temp: 97.8 F (36.6 C)  SpO2: 100%  Weight: 186 lb (84.4 kg)  Height: 5\' 8"  (1.727 m)  BMI Assessment: Estimated body mass index is 28.28 kg/m as calculated from the following:   Height as of this encounter: 5\' 8"  (1.727 m).   Weight as of this encounter: 186 lb (84.4 kg).   BMI interpretation: (25-29.9 kg/m2) = Overweight: This range is associated with a 20% higher incidence of chronic pain. BMI Readings from Last 4 Encounters:  06/29/16 28.28 kg/m  03/30/16 27.37 kg/m  01/18/16 27.37 kg/m  01/05/16 27.37 kg/m   Wt Readings from Last 4 Encounters:  06/29/16 186 lb (84.4 kg)  03/30/16 180 lb (81.6 kg)  01/18/16 180 lb (81.6 kg)  01/05/16 180 lb (81.6 kg)  Psych/Mental status: Alert and oriented x 3 (person, place, & time) Eyes: PERLA Respiratory: No evidence of acute respiratory distress  Cervical Spine Exam  Inspection: No masses, redness, or  swelling Alignment: Symmetrical Functional ROM: ROM appears unrestricted Stability: No instability detected Muscle strength & Tone: Functionally intact Sensory: Unimpaired Palpation: Non-contributory  Upper Extremity (UE) Exam    Side: Right upper extremity  Side: Left upper extremity  Inspection: No masses, redness, swelling, or asymmetry  Inspection: No masses, redness, swelling, or asymmetry  Functional ROM: ROM appears unrestricted          Functional ROM: ROM appears unrestricted          Muscle strength & Tone: Functionally intact  Muscle strength & Tone: Functionally intact  Sensory: Unimpaired  Sensory: Unimpaired  Palpation: Non-contributory  Palpation: Non-contributory   Thoracic Spine Exam  Inspection: No  masses, redness, or swelling Alignment: Symmetrical Functional ROM: ROM appears unrestricted Stability: No instability detected Sensory: Unimpaired Muscle strength & Tone: Functionally intact Palpation: Non-contributory  Lumbar Spine Exam  Inspection: No masses, redness, or swelling Alignment: Symmetrical Functional ROM: ROM appears unrestricted Stability: No instability detected Muscle strength & Tone: Functionally intact Sensory: Unimpaired Palpation: Non-contributory Provocative Tests: Lumbar Hyperextension and rotation test: evaluation deferred today       Patrick's Maneuver: evaluation deferred today              Gait & Posture Assessment  Ambulation: Unassisted Gait: Relatively normal for age and body habitus Posture: WNL   Lower Extremity Exam    Side: Right lower extremity  Side: Left lower extremity  Inspection: No masses, redness, swelling, or asymmetry  Inspection: No masses, redness, swelling, or asymmetry  Functional ROM: ROM appears unrestricted          Functional ROM: ROM appears unrestricted          Muscle strength & Tone: Functionally intact  Muscle strength & Tone: Functionally intact  Sensory: Unimpaired  Sensory: Unimpaired  Palpation:  Non-contributory  Palpation: Non-contributory   Assessment  Primary Diagnosis & Pertinent Problem List: The primary encounter diagnosis was Chronic pain. Diagnoses of Chronic knee pain (Location of Tertiary source of pain) (Bilateral) (Right), Primary osteoarthritis of right knee, Opiate use (60 MME/Day), Long term current use of systemic steroids, Neurogenic pain, Chronic low back pain (Location of Primary Source of Pain) (Bilateral) (L>R), and Lumbar facet syndrome (Location of Primary Source of Pain) (Bilateral) (R>L) were also pertinent to this visit.  Visit Diagnosis: 1. Chronic pain   2. Chronic knee pain (Location of Tertiary source of pain) (Bilateral) (Right)   3. Primary osteoarthritis of right knee   4. Opiate use (60 MME/Day)   5. Long term current use of systemic steroids   6. Neurogenic pain   7. Chronic low back pain (Location of Primary Source of Pain) (Bilateral) (L>R)   8. Lumbar facet syndrome (Location of Primary Source of Pain) (Bilateral) (R>L)    Plan of Care   Problem List Items Addressed This Visit      High   Chronic knee pain (Location of Tertiary source of pain) (Bilateral) (Right) (Chronic)   Relevant Medications   predniSONE (DELTASONE) 5 MG tablet   oxyCODONE-acetaminophen (PERCOCET) 10-325 MG tablet   oxyCODONE-acetaminophen (PERCOCET) 10-325 MG tablet (Start on 07/29/2016)   oxyCODONE-acetaminophen (PERCOCET) 10-325 MG tablet (Start on 08/28/2016)   gabapentin (NEURONTIN) 600 MG tablet   Other Relevant Orders   KNEE INJECTION   Chronic low back pain (Location of Primary Source of Pain) (Bilateral) (L>R) (Chronic)   Relevant Medications   predniSONE (DELTASONE) 5 MG tablet   oxyCODONE-acetaminophen (PERCOCET) 10-325 MG tablet   oxyCODONE-acetaminophen (PERCOCET) 10-325 MG tablet (Start on 07/29/2016)   oxyCODONE-acetaminophen (PERCOCET) 10-325 MG tablet (Start on 08/28/2016)   Other Relevant Orders   LUMBAR FACET(MEDIAL BRANCH NERVE BLOCK) MBNB    Chronic pain - Primary (Chronic)   Relevant Medications   predniSONE (DELTASONE) 5 MG tablet   oxyCODONE-acetaminophen (PERCOCET) 10-325 MG tablet   oxyCODONE-acetaminophen (PERCOCET) 10-325 MG tablet (Start on 07/29/2016)   oxyCODONE-acetaminophen (PERCOCET) 10-325 MG tablet (Start on 08/28/2016)   gabapentin (NEURONTIN) 600 MG tablet   Lumbar facet syndrome (Location of Primary Source of Pain) (Bilateral) (R>L) (Chronic)   Relevant Medications   predniSONE (DELTASONE) 5 MG tablet   oxyCODONE-acetaminophen (PERCOCET) 10-325 MG tablet   oxyCODONE-acetaminophen (PERCOCET) 10-325 MG tablet (Start  on 07/29/2016)   oxyCODONE-acetaminophen (PERCOCET) 10-325 MG tablet (Start on 08/28/2016)   Other Relevant Orders   LUMBAR FACET(MEDIAL BRANCH NERVE BLOCK) MBNB   Neurogenic pain (Chronic)   Relevant Medications   gabapentin (NEURONTIN) 600 MG tablet   Osteoarthritis of knee (Right) (Chronic)   Relevant Medications   predniSONE (DELTASONE) 5 MG tablet   oxyCODONE-acetaminophen (PERCOCET) 10-325 MG tablet   oxyCODONE-acetaminophen (PERCOCET) 10-325 MG tablet (Start on 07/29/2016)   oxyCODONE-acetaminophen (PERCOCET) 10-325 MG tablet (Start on 08/28/2016)   Other Relevant Orders   KNEE INJECTION     Medium   Long term current use of systemic steroids (Chronic)   Opiate use (60 MME/Day) (Chronic)    Other Visit Diagnoses   None.    Pharmacotherapy (Medications Ordered): Meds ordered this encounter  Medications  . oxyCODONE-acetaminophen (PERCOCET) 10-325 MG tablet    Sig: Take 1 tablet by mouth every 6 (six) hours as needed for pain.    Dispense:  120 tablet    Refill:  0    Do not place this medication, or any other prescription from our practice, on "Automatic Refill". Patient may have prescription filled one day early if pharmacy is closed on scheduled refill date. Do not fill until: 06/29/16 To last until: 07/29/16  . oxyCODONE-acetaminophen (PERCOCET) 10-325 MG tablet    Sig:  Take 1 tablet by mouth every 6 (six) hours as needed for pain.    Dispense:  120 tablet    Refill:  0    Do not place this medication, or any other prescription from our practice, on "Automatic Refill". Patient may have prescription filled one day early if pharmacy is closed on scheduled refill date. Do not fill until: 07/29/16 To last until: 08/28/16  . oxyCODONE-acetaminophen (PERCOCET) 10-325 MG tablet    Sig: Take 1 tablet by mouth every 6 (six) hours as needed for pain.    Dispense:  120 tablet    Refill:  0    Do not place this medication, or any other prescription from our practice, on "Automatic Refill". Patient may have prescription filled one day early if pharmacy is closed on scheduled refill date. Do not fill until: 08/28/16 To last until: 09/27/16  . gabapentin (NEURONTIN) 600 MG tablet    Sig: Take 1 tablet (600 mg total) by mouth every 8 (eight) hours.    Dispense:  270 tablet    Refill:  0    Do not place this medication, or any other prescription from our practice, on "Automatic Refill". Patient may have prescription filled one day early if pharmacy is closed on scheduled refill date.   New Prescriptions   No medications on file   Medications administered during this visit: Mr. Winther had no medications administered during this visit. Lab-work, Procedure(s), & Referral(s) Ordered: Orders Placed This Encounter  Procedures  . KNEE INJECTION  . LUMBAR FACET(MEDIAL BRANCH NERVE BLOCK) MBNB   Imaging & Referral(s) Ordered: None  Interventional Therapies: Scheduled:  Right Hyalgan knee injection + Diagnostic bilateral lumbar facet block under fluoro and IV sedation.   Considering:   Diagnostic bilateral  Lumbar facet block under fluoroscopic guidance and IV sedation.  Possible bilateral lumbar facet radiofrequency ablation, depending on the results of the diagnostic injection.  Diagnostic right intra-articular hip injection under fluoroscopic guidance, with a  without sedation.  Possible radiofrequency of the right hip joint depending on the diagnostic injection.  Diagnostic Right L4-5 lumbar epidural steroid injection under fluoroscopic guidance, with or without sedation.  Diagnostic bilateral intra-articular knee injection, without fluoroscopic guidance and IV sedation.  Possible series of 5 Hyalgan knee injections.  Diagnostic bilateral Genicular nerve block under fluoroscopic guidance, with a without sedation.  Possible bilateral Genicular nerve radiofrequency ablation depending on the results of the diagnostic injection.    PRN Procedures:   Diagnostic bilateral  Lumbar facet block under fluoroscopic guidance and IV sedation.  Diagnostic right intra-articular hip injection under fluoroscopic guidance, with a without sedation.  Diagnostic Right L4-5 lumbar epidural steroid injection under fluoroscopic guidance, with or without sedation.  Diagnostic bilateral intra-articular knee injection, without fluoroscopic guidance and IV sedation.  Possible series of 5 Hyalgan knee injections.  Diagnostic bilateral Genicular nerve block under fluoroscopic guidance, with a without sedation.    Requested PM Follow-up: Return in 3 months (on 09/19/2016) for Med-Mgmt, In addition, Schedule Procedure, (ASAA).  Future Appointments Date Time Provider Department Center  07/05/2016 8:45 AM Delano Metz, MD [facility] None  09/19/2016 11:00 AM Delano Metz, MD [facility] None    Primary Care Physician: Katharine Look, MD Location: [facility] Note by: Sydnee Levans Laban Emperor, M.D, DABA, DABAPM, DABPM, DABIPP, FIPP  Pain Score Disclaimer: We use the NRS-11 scale. This is a self-reported, subjective measurement of pain severity with only modest accuracy. It is used primarily to identify changes within a particular patient. It must be understood that outpatient pain scales are significantly less accurate that those used for  research, where they can be applied under ideal controlled circumstances with minimal exposure to variables. In reality, the score is likely to be a combination of pain intensity and pain affect, where pain affect describes the degree of emotional arousal or changes in action readiness caused by the sensory experience of pain. Factors such as social and work situation, setting, emotional state, anxiety levels, expectation, and prior pain experience may influence pain perception and show large inter-individual differences that may also be affected by time variables.  Patient instructions provided during this appointment: Patient Instructions   Facet Blocks Patient Information  Description: The facets are joints in the spine between the vertebrae.  Like any joints in the body, facets can become irritated and painful.  Arthritis can also effect the facets.  By injecting steroids and local anesthetic in and around these joints, we can temporarily block the nerve supply to them.  Steroids act directly on irritated nerves and tissues to reduce selling and inflammation which often leads to decreased pain.  Facet blocks may be done anywhere along the spine from the neck to the low back depending upon the location of your pain.   After numbing the skin with local anesthetic (like Novocaine), a small needle is passed onto the facet joints under x-ray guidance.  You may experience a sensation of pressure while this is being done.  The entire block usually lasts about 15-25 minutes.   Conditions which may be treated by facet blocks:   Low back/buttock pain  Neck/shoulder pain  Certain types of headaches  Preparation for the injection:  1. Do not eat any solid food or dairy products within 8 hours of your appointment. 2. You may drink clear liquid up to 3 hours before appointment.  Clear liquids include water, black coffee, juice or soda.  No milk or cream please. 3. You may take your regular medication,  including pain medications, with a sip of water before your appointment.  Diabetics should hold regular insulin (if taken separately) and take 1/2 normal NPH dose the  morning of the procedure.  Carry some sugar containing items with you to your appointment. 4. A driver must accompany you and be prepared to drive you home after your procedure. 5. Bring all your current medications with you. 6. An IV may be inserted and sedation may be given at the discretion of the physician. 7. A blood pressure cuff, EKG and other monitors will often be applied during the procedure.  Some patients may need to have extra oxygen administered for a short period. 8. You will be asked to provide medical information, including your allergies and medications, prior to the procedure.  We must know immediately if you are taking blood thinners (like Coumadin/Warfarin) or if you are allergic to IV iodine contrast (dye).  We must know if you could possible be pregnant.  Possible side-effects:   Bleeding from needle site  Infection (rare, may require surgery)  Nerve injury (rare)  Numbness & tingling (temporary)  Difficulty urinating (rare, temporary)  Spinal headache (a headache worse with upright posture)  Light-headedness (temporary)  Pain at injection site (serveral days)  Decreased blood pressure (rare, temporary)  Weakness in arm/leg (temporary)  Pressure sensation in back/neck (temporary)   Call if you experience:   Fever/chills associated with headache or increased back/neck pain  Headache worsened by an upright position  New onset, weakness or numbness of an extremity below the injection site  Hives or difficulty breathing (go to the emergency room)  Inflammation or drainage at the injection site(s)  Severe back/neck pain greater than usual  New symptoms which are concerning to you  Please note:  Although the local anesthetic injected can often make your back or neck feel good for  several hours after the injection, the pain will likely return. It takes 3-7 days for steroids to work.  You may not notice any pain relief for at least one week.  If effective, we will often do a series of 2-3 injections spaced 3-6 weeks apart to maximally decrease your pain.  After the initial series, you may be a candidate for a more permanent nerve block of the facets.  If you have any questions, please call #[phone] [facility]  What are the risk, side effects and possible complications? Generally speaking, most procedures are safe.  However, with any procedure there are risks, side effects, and the possibility of complications.  The risks and complications are dependent upon the sites that are lesioned, or the type of nerve block to be performed.  The closer the procedure is to the spine, the more serious the risks are.  Great care is taken when placing the radio frequency needles, block needles or lesioning probes, but sometimes complications can occur. 1. Infection: Any time there is an injection through the skin, there is a risk of infection.  This is why sterile conditions are used for these blocks.  There are four possible types of infection. 1. Localized skin infection. 2. Central Nervous System Infection-This can be in the form of Meningitis, which can be deadly. 3. Epidural Infections-This can be in the form of an epidural abscess, which can cause pressure inside of the spine, causing compression of the spinal cord with subsequent paralysis. This would require an emergency surgery to decompress, and there are no guarantees that the patient would recover from the paralysis. 4. Discitis-This is an infection of the intervertebral discs.  It occurs in about 1% of discography procedures.  It is difficult to treat  and it may lead to surgery.        2. Pain: the needles have to go through skin and soft tissues, will cause  soreness.       3. Damage to internal structures:  The nerves to be lesioned may be near blood vessels or    other nerves which can be potentially damaged.       4. Bleeding: Bleeding is more common if the patient is taking blood thinners such as  aspirin, Coumadin, Ticiid, Plavix, etc., or if he/she have some genetic predisposition  such as hemophilia. Bleeding into the spinal canal can cause compression of the spinal  cord with subsequent paralysis.  This would require an emergency surgery to  decompress and there are no guarantees that the patient would recover from the  paralysis.       5. Pneumothorax:  Puncturing of a lung is a possibility, every time a needle is introduced in  the area of the chest or upper back.  Pneumothorax refers to free air around the  collapsed lung(s), inside of the thoracic cavity (chest cavity).  Another two possible  complications related to a similar event would include: Hemothorax and Chylothorax.   These are variations of the Pneumothorax, where instead of air around the collapsed  lung(s), you may have blood or chyle, respectively.       6. Spinal headaches: They may occur with any procedures in the area of the spine.       7. Persistent CSF (Cerebro-Spinal Fluid) leakage: This is a rare problem, but may occur  with prolonged intrathecal or epidural catheters either due to the formation of a fistulous  track or a dural tear.       8. Nerve damage: By working so close to the spinal cord, there is always a possibility of  nerve damage, which could be as serious as a permanent spinal cord injury with  paralysis.       9. Death:  Although rare, severe deadly allergic reactions known as "Anaphylactic  reaction" can occur to any of the medications used.      10. Worsening of the symptoms:  We can always make thing worse.  What are the chances of something like this happening? Chances of any of this occuring are extremely low.  By statistics, you have more of a chance of  getting killed in a motor vehicle accident: while driving to the hospital than any of the above occurring .  Nevertheless, you should be aware that they are possibilities.  In general, it is similar to taking a shower.  Everybody knows that you can slip, hit your head and get killed.  Does that mean that you should not shower again?  Nevertheless always keep in mind that statistics do not mean anything if you happen to be on the wrong side of them.  Even if a procedure has a 1 (one) in a 1,000,000 (million) chance of going wrong, it you happen to be that one..Also, keep in mind that by statistics, you have more of a chance of having something go wrong when taking medications.  Who should not have this procedure? If you are on a blood thinning medication (e.g. Coumadin, Plavix, see list of "Blood Thinners"), or if you have an active infection going on, you should not have the procedure.  If you are taking any blood thinners, please inform your physician.  How should I prepare for this procedure?  Do not eat or drink  anything at least six hours prior to the procedure.  Bring a driver with you .  It cannot be a taxi.  Come accompanied by an adult that can drive you back, and that is strong enough to help you if your legs get weak or numb from the local anesthetic.  Take all of your medicines the morning of the procedure with just enough water to swallow them.  If you have diabetes, make sure that you are scheduled to have your procedure done first thing in the morning, whenever possible.  If you have diabetes, take only half of your insulin dose and notify our nurse that you have done so as soon as you arrive at the clinic.  If you are diabetic, but only take blood sugar pills (oral hypoglycemic), then do not take them on the morning of your procedure.  You may take them after you have had the procedure.  Do not take aspirin or any aspirin-containing medications, at least eleven (11) days prior to  the procedure.  They may prolong bleeding.  Wear loose fitting clothing that may be easy to take off and that you would not mind if it got stained with Betadine or blood.  Do not wear any jewelry or perfume  Remove any nail coloring.  It will interfere with some of our monitoring equipment.  NOTE: Remember that this is not meant to be interpreted as a complete list of all possible complications.  Unforeseen problems may occur.  BLOOD THINNERS The following drugs contain aspirin or other products, which can cause increased bleeding during surgery and should not be taken for 2 weeks prior to and 1 week after surgery.  If you should need take something for relief of minor pain, you may take acetaminophen which is found in Tylenol,m Datril, Anacin-3 and Panadol. It is not blood thinner. The products listed below are.  Do not take any of the products listed below in addition to any listed on your instruction sheet.  A.P.C or A.P.C with Codeine Codeine Phosphate Capsules #3 Ibuprofen Ridaura  ABC compound Congesprin Imuran rimadil  Advil Cope Indocin Robaxisal  Alka-Seltzer Effervescent Pain Reliever and Antacid Coricidin or Coricidin-D  Indomethacin Rufen  Alka-Seltzer plus Cold Medicine Cosprin Ketoprofen S-A-C Tablets  Anacin Analgesic Tablets or Capsules Coumadin Korlgesic Salflex  Anacin Extra Strength Analgesic tablets or capsules CP-2 Tablets Lanoril Salicylate  Anaprox Cuprimine Capsules Levenox Salocol  Anexsia-D Dalteparin Magan Salsalate  Anodynos Darvon compound Magnesium Salicylate Sine-off  Ansaid Dasin Capsules Magsal Sodium Salicylate  Anturane Depen Capsules Marnal Soma  APF Arthritis pain formula Dewitt's Pills Measurin Stanback  Argesic Dia-Gesic Meclofenamic Sulfinpyrazone  Arthritis Bayer Timed Release Aspirin Diclofenac Meclomen Sulindac  Arthritis pain formula Anacin Dicumarol Medipren Supac  Analgesic (Safety coated) Arthralgen Diffunasal Mefanamic Suprofen  Arthritis  Strength Bufferin Dihydrocodeine Mepro Compound Suprol  Arthropan liquid Dopirydamole Methcarbomol with Aspirin Synalgos  ASA tablets/Enseals Disalcid Micrainin Tagament  Ascriptin Doan's Midol Talwin  Ascriptin A/D Dolene Mobidin Tanderil  Ascriptin Extra Strength Dolobid Moblgesic Ticlid  Ascriptin with Codeine Doloprin or Doloprin with Codeine Momentum Tolectin  Asperbuf Duoprin Mono-gesic Trendar  Aspergum Duradyne Motrin or Motrin IB Triminicin  Aspirin plain, buffered or enteric coated Durasal Myochrisine Trigesic  Aspirin Suppositories Easprin Nalfon Trillsate  Aspirin with Codeine Ecotrin Regular or Extra Strength Naprosyn Uracel  Atromid-S Efficin Naproxen Ursinus  Auranofin Capsules Elmiron Neocylate Vanquish  Axotal Emagrin Norgesic Verin  Azathioprine Empirin or Empirin with Codeine Normiflo Vitamin E  Azolid Emprazil Nuprin Voltaren  Bayer Aspirin plain, buffered or children's or timed BC Tablets or powders Encaprin Orgaran Warfarin Sodium  Buff-a-Comp Enoxaparin Orudis Zorpin  Buff-a-Comp with Codeine Equegesic Os-Cal-Gesic   Buffaprin Excedrin plain, buffered or Extra Strength Oxalid   Bufferin Arthritis Strength Feldene Oxphenbutazone   Bufferin plain or Extra Strength Feldene Capsules Oxycodone with Aspirin   Bufferin with Codeine Fenoprofen Fenoprofen Pabalate or Pabalate-SF   Buffets II Flogesic Panagesic   Buffinol plain or Extra Strength Florinal or Florinal with Codeine Panwarfarin   Buf-Tabs Flurbiprofen Penicillamine   Butalbital Compound Four-way cold tablets Penicillin   Butazolidin Fragmin Pepto-Bismol   Carbenicillin Geminisyn Percodan   Carna Arthritis Reliever Geopen Persantine   Carprofen Gold's salt Persistin   Chloramphenicol Goody's Phenylbutazone   Chloromycetin Haltrain Piroxlcam   Clmetidine heparin Plaquenil   Cllnoril Hyco-pap Ponstel   Clofibrate Hydroxy chloroquine Propoxyphen         Before stopping any of these medications, be sure to  consult the physician who ordered them.  Some, such as Coumadin (Warfarin) are ordered to prevent or treat serious conditions such as "deep thrombosis", "pumonary embolisms", and other heart problems.  The amount of time that you may need off of the medication may also vary with the medication and the reason for which you were taking it.  If you are taking any of these medications, please make sure you notify your pain physician before you undergo any procedures.

## 2016-06-29 NOTE — Patient Instructions (Signed)

## 2016-07-05 ENCOUNTER — Ambulatory Visit
Admission: RE | Admit: 2016-07-05 | Discharge: 2016-07-05 | Disposition: A | Discharge status: Home / Self Care | Payer: Medicare Other | Source: Ambulatory Visit | Attending: Pain Medicine | Admitting: Pain Medicine

## 2016-07-05 ENCOUNTER — Ambulatory Visit (HOSPITAL_BASED_OUTPATIENT_CLINIC_OR_DEPARTMENT_OTHER): Payer: Medicare Other | Admitting: Pain Medicine

## 2016-07-05 ENCOUNTER — Encounter: Payer: Self-pay | Admitting: Pain Medicine

## 2016-07-05 VITALS — BP 156/81 | HR 59 | Temp 97.8°F | Resp 15 | Ht 68.5 in | Wt 186.0 lb

## 2016-07-05 DIAGNOSIS — M4186 Other forms of scoliosis, lumbar region: Secondary | ICD-10-CM | POA: Diagnosis not present

## 2016-07-05 DIAGNOSIS — Z79891 Long term (current) use of opiate analgesic: Secondary | ICD-10-CM | POA: Diagnosis not present

## 2016-07-05 DIAGNOSIS — M170 Bilateral primary osteoarthritis of knee: Secondary | ICD-10-CM | POA: Insufficient documentation

## 2016-07-05 DIAGNOSIS — M25561 Pain in right knee: Secondary | ICD-10-CM

## 2016-07-05 DIAGNOSIS — M25562 Pain in left knee: Secondary | ICD-10-CM | POA: Insufficient documentation

## 2016-07-05 DIAGNOSIS — F329 Major depressive disorder, single episode, unspecified: Secondary | ICD-10-CM | POA: Insufficient documentation

## 2016-07-05 DIAGNOSIS — I10 Essential (primary) hypertension: Secondary | ICD-10-CM | POA: Diagnosis not present

## 2016-07-05 DIAGNOSIS — E559 Vitamin D deficiency, unspecified: Secondary | ICD-10-CM | POA: Diagnosis not present

## 2016-07-05 DIAGNOSIS — M5136 Other intervertebral disc degeneration, lumbar region: Secondary | ICD-10-CM | POA: Insufficient documentation

## 2016-07-05 DIAGNOSIS — Z7952 Long term (current) use of systemic steroids: Secondary | ICD-10-CM | POA: Insufficient documentation

## 2016-07-05 DIAGNOSIS — F411 Generalized anxiety disorder: Secondary | ICD-10-CM | POA: Diagnosis not present

## 2016-07-05 DIAGNOSIS — G8929 Other chronic pain: Secondary | ICD-10-CM | POA: Insufficient documentation

## 2016-07-05 DIAGNOSIS — D860 Sarcoidosis of lung: Secondary | ICD-10-CM | POA: Diagnosis not present

## 2016-07-05 DIAGNOSIS — M545 Low back pain: Secondary | ICD-10-CM | POA: Diagnosis not present

## 2016-07-05 DIAGNOSIS — K7460 Unspecified cirrhosis of liver: Secondary | ICD-10-CM | POA: Insufficient documentation

## 2016-07-05 DIAGNOSIS — E119 Type 2 diabetes mellitus without complications: Secondary | ICD-10-CM | POA: Insufficient documentation

## 2016-07-05 DIAGNOSIS — E663 Overweight: Secondary | ICD-10-CM | POA: Diagnosis not present

## 2016-07-05 DIAGNOSIS — M47816 Spondylosis without myelopathy or radiculopathy, lumbar region: Secondary | ICD-10-CM

## 2016-07-05 MED ORDER — ROPIVACAINE HCL 2 MG/ML IJ SOLN
9.0000 mL | Freq: Once | INTRAMUSCULAR | Status: AC
  Administered 2016-07-05: 9 mL
  Filled 2016-07-05: qty 10

## 2016-07-05 MED ORDER — TRIAMCINOLONE ACETONIDE 40 MG/ML IJ SUSP
INTRAMUSCULAR | Status: AC
  Administered 2016-07-05: 10:00:00
  Filled 2016-07-05: qty 1

## 2016-07-05 MED ORDER — SODIUM HYALURONATE (VISCOSUP) 20 MG/2ML IX SOSY
2.0000 mL | PREFILLED_SYRINGE | Freq: Once | INTRA_ARTICULAR | Status: AC
  Administered 2016-07-05: 2 mL via INTRA_ARTICULAR
  Filled 2016-07-05: qty 2

## 2016-07-05 MED ORDER — LIDOCAINE HCL (PF) 1 % IJ SOLN
10.0000 mL | Freq: Once | INTRAMUSCULAR | Status: AC
  Administered 2016-07-05: 10 mL

## 2016-07-05 MED ORDER — MIDAZOLAM HCL 5 MG/5ML IJ SOLN
1.0000 mg | INTRAMUSCULAR | Status: DC | PRN
  Administered 2016-07-05: 2 mg via INTRAVENOUS
  Filled 2016-07-05: qty 5

## 2016-07-05 MED ORDER — FENTANYL CITRATE (PF) 100 MCG/2ML IJ SOLN
25.0000 ug | INTRAMUSCULAR | Status: DC | PRN
  Administered 2016-07-05: 50 ug via INTRAVENOUS
  Filled 2016-07-05: qty 2

## 2016-07-05 MED ORDER — LIDOCAINE HCL (PF) 1 % IJ SOLN
10.0000 mL | Freq: Once | INTRAMUSCULAR | Status: AC
  Administered 2016-07-05: 10 mL
  Filled 2016-07-05: qty 10

## 2016-07-05 MED ORDER — LACTATED RINGERS IV SOLN
1000.0000 mL | Freq: Once | INTRAVENOUS | Status: AC
Start: 2016-07-05 — End: 2016-07-05
  Administered 2016-07-05: 1000 mL via INTRAVENOUS

## 2016-07-05 MED ORDER — TRIAMCINOLONE ACETONIDE 40 MG/ML IJ SUSP
40.0000 mg | Freq: Once | INTRAMUSCULAR | Status: AC
  Administered 2016-07-05: 40 mg
  Filled 2016-07-05: qty 1

## 2016-07-05 NOTE — Progress Notes (Signed)
Patient's Name: Douglas Andrews  MRN: [id number]  Referring Provider: Fritzi Mandes, MD  DOB: 11-Jan-1951  PCP: Fritzi Mandes, MD  DOS: 07/05/2016  Note by: Kathlen Brunswick. Dossie Arbour, MD  Service setting: Ambulatory outpatient  Location: [facility] (AMB) Pain Management Facility  Visit type: Procedure  Specialty: Interventional Pain Management  Patient type: Established   Primary Reason for Visit: Interventional Pain Management Treatment. CC: Back Pain (low) and Knee Pain (right)  Procedure #1:  Anesthesia, Analgesia, Anxiolysis:  Type: Therapeutic Intra-Articular Knee Injection Region: Lateral  Knee Region Level: Knee Joint Laterality: Right    Type: Moderate (Conscious) Sedation & Local Anesthesia Local Anesthetic: Lidocaine 1% Route: Intravenous (IV) IV Access: Secured Sedation: Meaningful verbal contact was maintained at all times during the procedure  Indication(s): Analgesia & Anxiolysis   Procedure #2:  Anesthesia, Analgesia, Anxiolysis:  Type: Diagnostic Medial Branch Facet Block Region: Lumbar Level: L2, L3, L4, L5, & S1 Medial Branch Level(s) Laterality: Bilateral    Type: Moderate (Conscious) Sedation & Local Anesthesia Local Anesthetic: Lidocaine 1% Route: Intravenous (IV) IV Access: Secured Sedation: Meaningful verbal contact was maintained at all times during the procedure  Indication(s): Analgesia & Anxiolysis   Indications: 1. Chronic low back pain (Location of Primary Source of Pain) (Bilateral) (L>R)   2. Lumbar facet syndrome (Location of Primary Source of Pain) (Bilateral) (R>L)   3. Primary osteoarthritis of both knees   4. Chronic knee pain (Location of Tertiary source of pain) (Bilateral) (Right)     Pain Score: Pre-procedure: 5 /10 Post-procedure: 0-No pain/10  Pre-Procedure Assessment:  Douglas Andrews is a 65 y.o. year old, male patient, seen today for interventional treatment. He has Chronic low back pain (Location of Primary Source of Pain) (Bilateral)  (L>R); Chronic pain syndrome;  Lumbar DDD (degenerative disc disease);  Lumbar Spondylosis; Lumbar radicular pain (Right) (L5 Dermatome); Lumbar facet syndrome (Location of Primary Source of Pain) (Bilateral) (R>L); Opiate use (60 MME/Day); Uncomplicated opioid dependence ([place]); Long term prescription opiate use; Chronic knee pain (Location of Tertiary source of pain) (Bilateral) (Right); Vitamin D insufficiency; Overweight; Bell palsy; CN (constipation); Dermatitis, eczematoid; Diabetes mellitus ([place]); Dysmetria; Essential (primary) hypertension; Anxiety, generalized; Insomnia due to medical condition; Long term current use of systemic steroids; Type 2 diabetes mellitus ([place]); Pulmonary sarcoidosis ([place]); Arthropathy, traumatic, shoulder; Bursitis, trochanteric; Esophageal varices ([place]); Leg varices; Hepatic cirrhosis ([place]); Major depressive disorder with single episode ([place]); Other long term (current) drug therapy; At risk for falling; Encounter for chronic pain management; Encounter for therapeutic drug level monitoring; Chronic lower extremity pain (Location of Secondary source of pain) (Right); Chronic lumbar radicular pain (Right) (L5 Dermatome); Lumbar Levoscoliosis; Chronic pain; Neurogenic pain; Osteoarthritis of knee (Right); At high risk for falls; Chest pain; Skin lesion; Pain of left femur (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 His primarily concern today is the Back Pain (low) and Knee Pain (right)   Pain Descriptors / Indicators: Constant, Aching, Radiating Pain Frequency: Constant  Date of Last Visit: 06/29/16 Service Provided on Last Visit: Evaluation, Med Refill  Coagulation Parameters No results found for: INR, LABPROT, APTT, PLT  Verification of the correct person, correct site (including marking of site), and correct procedure were performed and confirmed by the patient.  Consent: Secured.  Under the influence of no sedatives a written informed consent was obtained, after having provided information on the risks and possible complications. To fulfill our ethical and legal obligations, as  recommended by the American Medical Association's Code of Ethics, we have provided information to the patient about our clinical impression; the nature and purpose of the treatment or procedure; the risks, benefits, and possible complications of the intervention; alternatives; the risk(s) and benefit(s) of the alternative treatment(s) or procedure(s); and the risk(s) and benefit(s) of doing nothing. The patient was provided information about the risks and possible complications associated with the procedure. These include, but are not limited to, failure to achieve desired goals, infection, bleeding, organ or nerve damage, allergic reactions, paralysis, and death. In the case of intra- or periarticular procedures these may include, but are not limited to, failure to achieve desired goals, infection, bleeding (hemarthrosis), organ or nerve damage, allergic reactions, and death. In addition, the patient was informed that Medicine is not an exact science; therefore, there is also the possibility of unforeseen risks and possible complications that may result in a catastrophic outcome. The patient indicated having understood very clearly. We have given the patient no guarantees and we have made no promises. Enough time was given to the patient to ask questions, all of which were answered to the patient's satisfaction.  Consent Attestation: I, the ordering provider, attest that I have discussed with the patient the benefits, risks, side-effects, alternatives, likelihood of achieving goals, and potential problems during recovery for the procedure that I have provided informed consent.  Pre-Procedure Preparation: Safety Precautions: Allergies reviewed. Appropriate site, procedure, and patient were confirmed by following  the Joint Commission's Universal Protocol (UP.01.01.01), in the form of a "Time Out". The patient was asked to confirm marked site and procedure, before commencing. The patient was asked about blood thinners, or active infections, both of which were denied. Patient was assessed for positional comfort and all pressure points were checked before starting procedure. Allergies: He has No Known Allergies.. Infection Control Precautions: Sterile technique used. Standard Universal Precautions were taken as recommended by the Department of [facility] for Disease Control and Prevention (CDC). Standard pre-surgical skin prep was conducted. Respiratory hygiene and cough etiquette was practiced. Hand hygiene observed. Safe injection practices and needle disposal techniques followed. SDV (single dose vial) medications used. Medications properly checked for expiration dates and contaminants. Personal protective equipment (PPE) used: Sterile Radiation-resistant gloves. Monitoring:  As per clinic protocol. Vitals:   07/05/16 1003 07/05/16 1008 07/05/16 1018 07/05/16 1028  BP: (!) 155/92 (!) 167/89 (!) 152/92 (!) 156/81  Pulse: 63 62 60 (!) 59  Resp: _0 Temp:      TempSrc:      SpO2: 97% 96% 95% 95%  Weight:      Height:      Calculated BMI: Body mass index is 27.87 kg/m.  Description of Procedure #1 Process:   Time-out: "Time-out" completed before starting procedure, as per protocol. Position: Sitting Target Area: Knee Joint Approach: Lateral approach. Area Prepped: Entire knee area, from the mid-thigh to the mid-shin. Prepping solution: ChloraPrep (2% chlorhexidine gluconate and 70% isopropyl alcohol) Safety Precautions: Aspiration looking for blood return was conducted prior to all injections. At no point did we inject any substances, as a needle was being advanced. No attempts were made at seeking any paresthesias. Safe injection practices and needle disposal techniques used.  Medications properly checked for expiration dates. SDV (single dose vial) medications used.    Description of the Procedure: Protocol guidelines were followed. The patient was placed in position over the fluoroscopy table. The target area was identified and the area prepped in the  usual manner. Skin desensitized using vapocoolant spray. Skin & deeper tissues infiltrated with local anesthetic. Appropriate amount of time allowed to pass for local anesthetics to take effect. The procedure needles were then advanced to the target area. Proper needle placement secured. Negative aspiration confirmed. Solution injected in intermittent fashion, asking for systemic symptoms every 0.5cc of injectate. The needles were then removed and the area cleansed, making sure to leave some of the prepping solution back to take advantage of its long term bactericidal properties. EBL: None Materials & Medications Used:  Needle(s) Used: 22g - 1.5" Needle(s) Medications Administered today: We administered fentaNYL, lactated ringers, midazolam, triamcinolone acetonide, lidocaine (PF), ropivacaine (PF) 2 mg/ml (0.2%), Sodium Hyaluronate, lidocaine (PF), ropivacaine (PF) 2 mg/ml (0.2%), and triamcinolone acetonide.Please see chart orders for dosing details.  Imaging Guidance for Procedure #1:   Type of Imaging Technique: None  Description of Procedure #2 Process:   Time-out: "Time-out" completed before starting procedure, as per protocol. Position: Prone Target Area: For Lumbar Facet blocks, the target is the groove formed by the junction of the transverse process and superior articular process. For the L5 dorsal ramus, the target is the notch between superior articular process and sacral ala. For the S1 dorsal ramus, the target is the superior and lateral edge of the posterior S1 Sacral foramen. Approach: Paramedial approach. Area Prepped: Entire Posterior Lumbosacral Region Prepping solution: ChloraPrep (2% chlorhexidine  gluconate and 70% isopropyl alcohol) Safety Precautions: Aspiration looking for blood return was conducted prior to all injections. At no point did we inject any substances, as a needle was being advanced. No attempts were made at seeking any paresthesias. Safe injection practices and needle disposal techniques used. Medications properly checked for expiration dates. SDV (single dose vial) medications used. Description of the Procedure: Protocol guidelines were followed. The patient was placed in position over the fluoroscopy table. The target area was identified and the area prepped in the usual manner. Skin desensitized using vapocoolant spray. Skin & deeper tissues infiltrated with local anesthetic. Appropriate amount of time allowed to pass for local anesthetics to take effect. The procedure needle was introduced through the skin, ipsilateral to the reported pain, and advanced to the target area. Employing the "Medial Branch Technique", the needles were advanced to the angle made by the superior and medial portion of the transverse process, and the lateral and inferior portion of the superior articulating process of the targeted vertebral bodies. This area is known as "Burton's Eye" or the "Eye of the [place] Dog". A procedure needle was introduced through the skin, and this time advanced to the angle made by the superior and medial border of the sacral ala, and the lateral border of the S1 vertebral body. This last needle was later repositioned at the superior and lateral border of the posterior S1 foramen. Negative aspiration confirmed. Solution injected in intermittent fashion, asking for systemic symptoms every 0.5cc of injectate. The needles were then removed and the area cleansed, making sure to leave some of the prepping solution back to take advantage of its long term bactericidal properties. EBL: None Materials & Medications Used:  Needle(s) Used: 22g - 3.5" Spinal Needle(s)  Imaging Guidance  for Procedure #2:   Type of Imaging Technique: Fluoroscopy Guidance (Spinal) Indication(s): Assistance in needle guidance and placement for procedures requiring needle placement in or near specific anatomical locations not easily accessible without such assistance. Exposure Time: Please see nurses notes. Contrast: None required. Fluoroscopic Guidance: I was personally present in the fluoroscopy suite, where  the patient was placed in position for the procedure, over the fluoroscopy-compatible table. Fluoroscopy was manipulated, using "Tunnel Vision Technique", to obtain the best possible view of the target area, on the affected side. Parallax error was corrected before commencing the procedure. A "direction-depth-direction" technique was used to introduce the needle under continuous pulsed fluoroscopic guidance. Once the target was reached, antero-posterior, oblique, and lateral fluoroscopic projection views were taken to confirm needle placement in all planes. Permanently recorded images stored by scanning into EMR. Interpretation: Intraoperative imaging interpretation by performing Physician. Adequate needle placement confirmed. Adequate needle placement confirmed in AP, lateral, & Oblique Views. No contrast injected.  Antibiotic Prophylaxis:  Indication(s): No indications identified. Type:  Antibiotics Given (last 72 hours)    None      Post-operative Assessment:   Complications: No immediate post-treatment complications were observed. Disposition: Return to clinic for follow-up evaluation. The patient tolerated the entire procedure well. A repeat set of vitals were taken after the procedure and the patient was kept under observation following institutional policy, for this type of procedure. The patient was discharged home, once institutional criteria were met. The patient was provided with post-procedure discharge instructions, including a section on how to identify potential problems. Should  any problems arise concerning this procedure, the patient was given instructions to immediately contact us, at any time, without hesitation. In any case, we plan to contact the patient by telephone for a follow-up status report regarding this interventional procedure. Comments:  No additional relevant information.  Plan of Care   Problem List Items Addressed This Visit      High   Chronic knee pain (Location of Tertiary source of pain) (Bilateral) (Right) (Chronic)   Relevant Medications   fentaNYL (SUBLIMAZE) injection 25-50 mcg   triamcinolone acetonide (KENALOG-40) injection 40 mg (Completed)   lidocaine (PF) (XYLOCAINE) 1 % injection 10 mL (Completed)   ropivacaine (PF) 2 mg/ml (0.2%) (NAROPIN) epidural 9 mL (Completed)   Sodium Hyaluronate SOSY 2 mL (Completed)   lidocaine (PF) (XYLOCAINE) 1 % injection 10 mL (Completed)   ropivacaine (PF) 2 mg/ml (0.2%) (NAROPIN) epidural 9 mL (Completed)   triamcinolone acetonide (KENALOG-40) 40 MG/ML injection (Completed)   Other Relevant Orders   KNEE INJECTION   Chronic low back pain (Location of Primary Source of Pain) (Bilateral) (L>R) - Primary (Chronic)   Relevant Medications   fentaNYL (SUBLIMAZE) injection 25-50 mcg   triamcinolone acetonide (KENALOG-40) injection 40 mg (Completed)   Sodium Hyaluronate SOSY 2 mL (Completed)   triamcinolone acetonide (KENALOG-40) 40 MG/ML injection (Completed)   Other Relevant Orders   LUMBAR FACET(MEDIAL BRANCH NERVE BLOCK) MBNB   DG C-Arm 1-60 Min-No Report (Completed)   Lumbar facet syndrome (Location of Primary Source of Pain) (Bilateral) (R>L) (Chronic)   Relevant Medications   fentaNYL (SUBLIMAZE) injection 25-50 mcg   lactated ringers infusion 1,000 mL (Completed)   midazolam (VERSED) 5 MG/5ML injection 1-2 mg   triamcinolone acetonide (KENALOG-40) injection 40 mg (Completed)   lidocaine (PF) (XYLOCAINE) 1 % injection 10 mL (Completed)   ropivacaine (PF) 2 mg/ml (0.2%) (NAROPIN) epidural 9 mL  (Completed)   Sodium Hyaluronate SOSY 2 mL (Completed)   triamcinolone acetonide (KENALOG-40) 40 MG/ML injection (Completed)   Other Relevant Orders   LUMBAR FACET(MEDIAL BRANCH NERVE BLOCK) MBNB   DG C-Arm 1-60 Min-No Report (Completed)   Osteoarthritis of knee (Bilateral) (R>L) (Chronic)   Relevant Medications   fentaNYL (SUBLIMAZE) injection 25-50 mcg   triamcinolone acetonide (KENALOG-40) injection 40 mg (Completed)  Sodium Hyaluronate SOSY 2 mL (Completed)   lidocaine (PF) (XYLOCAINE) 1 % injection 10 mL (Completed)   ropivacaine (PF) 2 mg/ml (0.2%) (NAROPIN) epidural 9 mL (Completed)   triamcinolone acetonide (KENALOG-40) 40 MG/ML injection (Completed)   Other Relevant Orders   KNEE INJECTION    Other Visit Diagnoses   None.     Requested PM Follow-up: Return in about 2 weeks (around 07/19/2016) for Post-Procedure evaluation.  Future Appointments Date Time Provider [place]  08/10/2016 9:20 AM Milinda Pointer, MD [facility] None  09/19/2016 11:00 AM Milinda Pointer, MD [facility] None    Primary Care Physician: Fritzi Mandes, MD Location: [facility] Note by: Kathlen Brunswick Dossie Arbour, M.D, DABA, DABAPM, DABPM, DABIPP, FIPP  Disclaimer:  Medicine is not an exact science. The only guarantee in medicine is that nothing is guaranteed. It is important to note that the decision to proceed with this intervention was based on the information collected from the patient. The Data and conclusions were drawn from the patient's questionnaire, the interview, and the physical examination. Because the information was provided in large part by the patient, it cannot be guaranteed that it has not been purposely or unconsciously manipulated. Every effort has been made to obtain as much relevant data as possible for this evaluation. It is important to note that the conclusions that lead to this procedure are derived in large part from the available data.  Always take into account that the treatment will also be dependent on availability of resources and existing treatment guidelines, considered by other Pain Management Practitioners as being common knowledge and practice, at the time of the intervention. For Medico-Legal purposes, it is also important to point out that variation in procedural techniques and pharmacological choices are the acceptable norm. The indications, contraindications, technique, and results of the above procedure should only be interpreted and judged by a Board-Certified Interventional Pain Specialist with extensive familiarity and expertise in the same exact procedure and technique. Attempts at providing opinions without similar or greater experience and expertise than that of the treating physician will be considered as inappropriate and unethical, and shall result in a formal complaint to the state medical board and applicable specialty societies.

## 2016-07-05 NOTE — Patient Instructions (Addendum)
Trigger Point Injection Trigger points are areas where you have muscle pain. A trigger point injection is a shot given in the trigger point to relieve that pain. A trigger point might feel like a knot in your muscle. It hurts to press on a trigger point. Sometimes the pain spreads out (radiates) to other parts of the body. For example, pressing on a trigger point in your shoulder might cause pain in your arm or neck. You might have one trigger point. Or, you might have more than one. People often have trigger points in their upper back and lower back. They also occur often in the neck and shoulders. Pain from a trigger point lasts for a long time. It can make it hard to keep moving. You might not be able to do the exercise or physical therapy that could help you deal with the pain. A trigger point injection may help. It does not work for everyone. But, it may relieve your pain for a few days or a few months. A trigger point injection does not cure long-lasting (chronic) pain. LET YOUR CAREGIVER KNOW ABOUT:  Any allergies (especially to latex, lidocaine, or steroids).  Blood-thinning medicines that you take. These drugs can lead to bleeding or bruising after an injection. They include:  Aspirin.  Ibuprofen.  Clopidogrel.  Warfarin.  Other medicines you take. This includes all vitamins, herbs, eyedrops, over-the-counter medicines, and creams.  Use of steroids.  Recent infections.  Past problems with numbing medicines.  Bleeding problems.  Surgeries you have had.  Other health problems. RISKS AND COMPLICATIONS A trigger point injection is a safe treatment. However, problems may develop, such as:  Minor side effects usually go away in 1 to 2 days. These may include:  Soreness.  Bruising.  Stiffness.  More serious problems are rare. But, they may include:  Bleeding under the skin (hematoma).  Skin infection.  Breaking off of the needle under your skin.  Lung  puncture.  The trigger point injection may not work for you. BEFORE THE PROCEDURE You may need to stop taking any medicine that thins your blood. This is to prevent bleeding and bruising. Usually these medicines are stopped several days before the injection. No other preparation is needed. PROCEDURE  A trigger point injection can be given in your caregiver's office or in a clinic. Each injection takes 2 minutes or less.  Your caregiver will feel for trigger points. The caregiver may use a marker to circle the area for the injection.  The skin over the trigger point will be washed with a germ-killing (antiseptic) solution.  The caregiver pinches the spot for the injection.  Then, a very thin needle is used for the shot. You may feel pain or a twitching feeling when the needle enters the trigger point.  A numbing solution may be injected into the trigger point. Sometimes a drug to keep down swelling, redness, and warmth (inflammation) is also injected.  Your caregiver moves the needle around the trigger zone until the tightness and twitching goes away.  After the injection, your caregiver may put gentle pressure over the injection site.  Then it is covered with a bandage. AFTER THE PROCEDURE  You can go right home after the injection.  The bandage can be taken off after a few hours.  You may feel sore and stiff for 1 to 2 days.  Go back to your regular activities slowly. Your caregiver may ask you to stretch your muscles. Do not do anything that takes   extra energy for a few days.  Follow your caregiver's instructions to manage and treat other pain.   This information is not intended to replace advice given to you by your health care provider. Make sure you discuss any questions you have with your health care provider.   Document Released: 09/15/2011 Document Revised: 01/21/2013 Document Reviewed: 09/15/2011 Elsevier Interactive Patient Education 2016 [employer] Pain  Management Discharge Instructions  General Discharge Instructions :  If you need to reach your doctor call: Monday-Friday 8:00 am - 4:00 pm at [phone] or toll free [phone].  After clinic hours [phone] to have operator reach doctor.  Bring all of your medication bottles to all your appointments in the pain clinic.  To cancel or reschedule your appointment with Pain Management please remember to call 24 hours in advance to avoid a fee.  Refer to the educational materials which you have been given on: General Risks, I had my Procedure. Discharge Instructions, Post Sedation.  Post Procedure Instructions:  The drugs you were given will stay in your system until tomorrow, so for the next 24 hours you should not drive, make any legal decisions or drink any alcoholic beverages.  You may eat anything you prefer, but it is better to start with liquids then soups and crackers, and gradually work up to solid foods.  Please notify your doctor immediately if you have any unusual bleeding, trouble breathing or pain that is not related to your normal pain.  Depending on the type of procedure that was done, some parts of your body may feel week and/or numb.  This usually clears up by tonight or the next day.  Walk with the use of an assistive device or accompanied by an adult for the 24 hours.  You may use ice on the affected area for the first 24 hours.  Put ice in a Ziploc bag and cover with a towel and place against area 15 minutes on 15 minutes off.  You may switch to heat after 24 hours.Facet Joint Block, Care After Refer to this sheet in the next few weeks. These instructions provide you with information on caring for yourself after your procedure. Your health care provider may also give you more specific instructions. Your treatment has been planned according to current medical practices, but problems sometimes occur. Call your health care provider if you have any problems or  questions after your procedure. HOME CARE INSTRUCTIONS   Keep track of the amount of pain relief you feel and how long it lasts.  Limit pain medicine within the first 4-6 hours after the procedure as directed by your health care provider.  Resume taking dietary supplements and medicines as directed by your health care provider.  You may resume your regular diet.  Do not apply heat near or over the injection site(s) for 24 hours.   Do not take a bath or soak in water (such as a pool or lake) for 24 hours.  Do not drive for 24 hours unless approved by your health care provider.  Avoid strenuous activity for 24 hours.  Remove your bandages the morning after the procedure.   If the injection site is tender, applying an ice pack may relieve some tenderness. To do this:  Put ice in a bag.  Place a towel between your skin and the bag.  Leave the ice on for 15-20 minutes, 3-4 times a day.  Keep follow-up appointments as directed by your health care provider. SEEK MEDICAL CARE IF:  Your pain is not controlled by your medicines.   There is drainage from the injection site.   There is significant bleeding or swelling at the injection site.  You have diabetes and your blood sugar is above 180 mg/dL. SEEK IMMEDIATE MEDICAL CARE IF:   You develop a fever of 101F (38.3C) or greater.   You have worsening pain or swelling around the injection site.   You have red streaking around the injection site.   You develop severe pain that is not controlled by your medicines.   You develop a headache, stiff neck, nausea, or vomiting.   Your eyes become very sensitive to light.   You have weakness, paralysis, or tingling in your arms or legs that was not present before the procedure.   You develop difficulty urinating or breathing.    This information is not intended to replace advice given to you by your health care provider. Make sure you discuss any questions you have  with your health care provider.   Document Released: 09/12/2012 Document Revised: 10/17/2014 Document Reviewed: 09/12/2012 Elsevier Interactive Patient Education 2016 [employer] Facet Joint Block The facet joints connect the bones of the spine (vertebrae). They make it possible for you to bend, twist, and make other movements with your spine. They also prevent you from overbending, overtwisting, and making other excessive movements.  A facet joint block is a procedure where a numbing medicine (anesthetic) is injected into a facet joint. Often, a type of anti-inflammatory medicine called a steroid is also injected. A facet joint block may be done for two reasons:   Diagnosis. A facet joint block may be done as a test to see whether neck or back pain is caused by a worn-down or infected facet joint. If the pain gets better after a facet joint block, it means the pain is probably coming from the facet joint. If the pain does not get better, it means the pain is probably not coming from the facet joint.   Therapy. A facet joint block may be done to relieve neck or back pain caused by a facet joint. A facet joint block is only done as a therapy if the pain does not improve with medicine, exercise programs, physical therapy, and other forms of pain management. LET [facility] PROVID[facility] KNOW ABOUT:   Any allergies you have.   All medicines you are taking, including vitamins, herbs, eyedrops, and over-the-counter medicines and creams.   Previous problems you or members of your family have had with the use of anesthetics.   Any blood disorders you have had.   Other health problems you have. RISKS AND COMPLICATIONS Generally, having a facet joint block is safe. However, as with any procedure, complications can occur. Possible complications associated with having a facet joint block include:   Bleeding.   Injury to a nerve near the injection site.   Pain at the injection site.    Weakness or numbness in areas controlled by nerves near the injection site.   Infection.   Temporary fluid retention.   Allergic reaction to anesthetics or medicines used during the procedure. BEFORE THE PROCEDURE   Follow your health care provider's instructions if you are taking dietary supplements or medicines. You may need to stop taking them or reduce your dosage.   Do not take any new dietary supplements or medicines without asking your health care provider first.   Follow your health care provider's instructions about eating and drinking before the procedure. You  may need to stop eating and drinking several hours before the procedure.   Arrange to have an adult drive you home after the procedure. PROCEDURE  You may need to remove your clothing and dress in an open-back gown so that your health care provider can access your spine.   The procedure will be done while you are lying on an X-ray table. Most of the time you will be asked to lie on your stomach, but you may be asked to lie in a different position if an injection will be made in your neck.   Special machines will be used to monitor your oxygen levels, heart rate, and blood pressure.   If an injection will be made in your neck, an intravenous (IV) tube will be inserted into one of your veins. Fluids and medicine will flow directly into your body through the IV tube.   The area over the facet joint where the injection will be made will be cleaned with an antiseptic soap. The surrounding skin will be covered with sterile drapes.   An anesthetic will be applied to your skin to make the injection area numb. You may feel a temporary stinging or burning sensation.   A video X-ray machine will be used to locate the joint. A contrast dye may be injected into the facet joint area to help with locating the joint.   When the joint is located, an anesthetic medicine will be injected into the joint through the  needle.   Your health care provider will ask you whether you feel pain relief. If you do feel relief, a steroid may be injected to provide pain relief for a longer period of time. If you do not feel relief or feel only partial relief, additional injections of an anesthetic may be made in other facet joints.   The needle will be removed, the skin will be cleansed, and bandages will be applied.  AFTER THE PROCEDURE   You will be observed for 15-30 minutes before being allowed to go home. Do not drive. Have an adult drive you or take a taxi or public transportation instead.   If you feel pain relief, the pain will return in several hours or days when the anesthetic wears off.   You may feel pain relief 2-14 days after the procedure. The amount of time this relief lasts varies from person to person.   It is normal to feel some tenderness over the injected area(s) for 2 days following the procedure.   If you have diabetes, you may have a temporary increase in blood sugar.   This information is not intended to replace advice given to you by your health care provider. Make sure you discuss any questions you have with your health care provider.   Document Released: 02/15/2007 Document Revised: 10/17/2014 Document Reviewed: 07/16/2012 Elsevier Interactive Patient Education 2016 [employer] GENERAL RISKS AND COMPLICATIONS  What are the risk, side effects and possible complications? Generally speaking, most procedures are safe.  However, with any procedure there are risks, side effects, and the possibility of complications.  The risks and complications are dependent upon the sites that are lesioned, or the type of nerve block to be performed.  The closer the procedure is to the spine, the more serious the risks are.  Great care is taken when placing the radio frequency needles, block needles or lesioning probes, but sometimes complications can occur. 1. Infection: Any time there is an  injection through the skin, there is  a risk of infection.  This is why sterile conditions are used for these blocks.  There are four possible types of infection. 1. Localized skin infection. 2. Central Nervous System Infection-This can be in the form of Meningitis, which can be deadly. 3. Epidural Infections-This can be in the form of an epidural abscess, which can cause pressure inside of the spine, causing compression of the spinal cord with subsequent paralysis. This would require an emergency surgery to decompress, and there are no guarantees that the patient would recover from the paralysis. 4. Discitis-This is an infection of the intervertebral discs.  It occurs in about 1% of discography procedures.  It is difficult to treat and it may lead to surgery.        2. Pain: the needles have to go through skin and soft tissues, will cause soreness.       3. Damage to internal structures:  The nerves to be lesioned may be near blood vessels or    other nerves which can be potentially damaged.       4. Bleeding: Bleeding is more common if the patient is taking blood thinners such as  aspirin, Coumadin, Ticiid, Plavix, etc., or if he/she have some genetic predisposition  such as hemophilia. Bleeding into the spinal canal can cause compression of the spinal  cord with subsequent paralysis.  This would require an emergency surgery to  decompress and there are no guarantees that the patient would recover from the  paralysis.       5. Pneumothorax:  Puncturing of a lung is a possibility, every time a needle is introduced in  the area of the chest or upper back.  Pneumothorax refers to free air around the  collapsed lung(s), inside of the thoracic cavity (chest cavity).  Another two possible  complications related to a similar event would include: Hemothorax and Chylothorax.   These are variations of the Pneumothorax, where instead of air around the collapsed  lung(s), you may have blood or chyle, respectively.        6. Spinal headaches: They may occur with any procedures in the area of the spine.       7. Persistent CSF (Cerebro-Spinal Fluid) leakage: This is a rare problem, but may occur  with prolonged intrathecal or epidural catheters either due to the formation of a fistulous  track or a dural tear.       8. Nerve damage: By working so close to the spinal cord, there is always a possibility of  nerve damage, which could be as serious as a permanent spinal cord injury with  paralysis.       9. Death:  Although rare, severe deadly allergic reactions known as "Anaphylactic  reaction" can occur to any of the medications used.      10. Worsening of the symptoms:  We can always make thing worse.  What are the chances of something like this happening? Chances of any of this occuring are extremely low.  By statistics, you have more of a chance of getting killed in a motor vehicle accident: while driving to the hospital than any of the above occurring .  Nevertheless, you should be aware that they are possibilities.  In general, it is similar to taking a shower.  Everybody knows that you can slip, hit your head and get killed.  Does that mean that you should not shower again?  Nevertheless always keep in mind that statistics do not mean anything if you happen  to be on the wrong side of them.  Even if a procedure has a 1 (one) in a 1,000,000 (million) chance of going wrong, it you happen to be that one..Also, keep in mind that by statistics, you have more of a chance of having something go wrong when taking medications.  Who should not have this procedure? If you are on a blood thinning medication (e.g. Coumadin, Plavix, see list of "Blood Thinners"), or if you have an active infection going on, you should not have the procedure.  If you are taking any blood thinners, please inform your physician.  How should I prepare for this procedure?  Do not eat or drink anything at least six hours prior to the  procedure.  Bring a driver with you .  It cannot be a taxi.  Come accompanied by an adult that can drive you back, and that is strong enough to help you if your legs get weak or numb from the local anesthetic.  Take all of your medicines the morning of the procedure with just enough water to swallow them.  If you have diabetes, make sure that you are scheduled to have your procedure done first thing in the morning, whenever possible.  If you have diabetes, take only half of your insulin dose and notify our nurse that you have done so as soon as you arrive at the clinic.  If you are diabetic, but only take blood sugar pills (oral hypoglycemic), then do not take them on the morning of your procedure.  You may take them after you have had the procedure.  Do not take aspirin or any aspirin-containing medications, at least eleven (11) days prior to the procedure.  They may prolong bleeding.  Wear loose fitting clothing that may be easy to take off and that you would not mind if it got stained with Betadine or blood.  Do not wear any jewelry or perfume  Remove any nail coloring.  It will interfere with some of our monitoring equipment.  NOTE: Remember that this is not meant to be interpreted as a complete list of all possible complications.  Unforeseen problems may occur.  BLOOD THINNERS The following drugs contain aspirin or other products, which can cause increased bleeding during surgery and should not be taken for 2 weeks prior to and 1 week after surgery.  If you should need take something for relief of minor pain, you may take acetaminophen which is found in Tylenol,m Datril, Anacin-3 and Panadol. It is not blood thinner. The products listed below are.  Do not take any of the products listed below in addition to any listed on your instruction sheet.  A.P.C or A.P.C with Codeine Codeine Phosphate Capsules #3 Ibuprofen Ridaura  ABC compound Congesprin Imuran rimadil  Advil Cope Indocin  Robaxisal  Alka-Seltzer Effervescent Pain Reliever and Antacid Coricidin or Coricidin-D  Indomethacin Rufen  Alka-Seltzer plus Cold Medicine Cosprin Ketoprofen S-A-C Tablets  Anacin Analgesic Tablets or Capsules Coumadin Korlgesic Salflex  Anacin Extra Strength Analgesic tablets or capsules CP-2 Tablets Lanoril Salicylate  Anaprox Cuprimine Capsules Levenox Salocol  Anexsia-D Dalteparin Magan Salsalate  Anodynos Darvon compound Magnesium Salicylate Sine-off  Ansaid Dasin Capsules Magsal Sodium Salicylate  Anturane Depen Capsules Marnal Soma  APF Arthritis pain formula Dewitt's Pills Measurin Stanback  Argesic Dia-Gesic Meclofenamic Sulfinpyrazone  Arthritis Bayer Timed Release Aspirin Diclofenac Meclomen Sulindac  Arthritis pain formula Anacin Dicumarol Medipren Supac  Analgesic (Safety coated) Arthralgen Diffunasal Mefanamic Suprofen  Arthritis Strength Bufferin Dihydrocodeine Mepro  Compound Suprol  Arthropan liquid Dopirydamole Methcarbomol with Aspirin Synalgos  ASA tablets/Enseals Disalcid Micrainin Tagament  Ascriptin Doan's Midol Talwin  Ascriptin A/D Dolene Mobidin Tanderil  Ascriptin Extra Strength Dolobid Moblgesic Ticlid  Ascriptin with Codeine Doloprin or Doloprin with Codeine Momentum Tolectin  Asperbuf Duoprin Mono-gesic Trendar  Aspergum Duradyne Motrin or Motrin IB Triminicin  Aspirin plain, buffered or enteric coated Durasal Myochrisine Trigesic  Aspirin Suppositories Easprin Nalfon Trillsate  Aspirin with Codeine Ecotrin Regular or Extra Strength Naprosyn Uracel  Atromid-S Efficin Naproxen Ursinus  Auranofin Capsules Elmiron Neocylate Vanquish  Axotal Emagrin Norgesic Verin  Azathioprine Empirin or Empirin with Codeine Normiflo Vitamin E  Azolid Emprazil Nuprin Voltaren  Bayer Aspirin plain, buffered or children's or timed BC Tablets or powders Encaprin Orgaran Warfarin Sodium  Buff-a-Comp Enoxaparin Orudis Zorpin  Buff-a-Comp with Codeine Equegesic Os-Cal-Gesic    Buffaprin Excedrin plain, buffered or Extra Strength Oxalid   Bufferin Arthritis Strength Feldene Oxphenbutazone   Bufferin plain or Extra Strength Feldene Capsules Oxycodone with Aspirin   Bufferin with Codeine Fenoprofen Fenoprofen Pabalate or Pabalate-SF   Buffets II Flogesic Panagesic   Buffinol plain or Extra Strength Florinal or Florinal with Codeine Panwarfarin   Buf-Tabs Flurbiprofen Penicillamine   Butalbital Compound Four-way cold tablets Penicillin   Butazolidin Fragmin Pepto-Bismol   Carbenicillin Geminisyn Percodan   Carna Arthritis Reliever Geopen Persantine   Carprofen Gold's salt Persistin   Chloramphenicol Goody's Phenylbutazone   Chloromycetin Haltrain Piroxlcam   Clmetidine heparin Plaquenil   Cllnoril Hyco-pap Ponstel   Clofibrate Hydroxy chloroquine Propoxyphen         Before stopping any of these medications, be sure to consult the physician who ordered them.  Some, such as Coumadin (Warfarin) are ordered to prevent or treat serious conditions such as "deep thrombosis", "pumonary embolisms", and other heart problems.  The amount of time that you may need off of the medication may also vary with the medication and the reason for which you were taking it.  If you are taking any of these medications, please make sure you notify your pain physician before you undergo any procedures.         Pain Management Discharge Instructions  General Discharge Instructions :  If you need to reach your doctor call: Monday-Friday 8:00 am - 4:00 pm at [phone] or toll free [phone].  After clinic hours [phone] to have operator reach doctor.  Bring all of your medication bottles to all your appointments in the pain clinic.  To cancel or reschedule your appointment with Pain Management please remember to call 24 hours in advance to avoid a fee.  Refer to the educational materials which you have been given on: General Risks, I had my Procedure. Discharge  Instructions, Post Sedation.  Post Procedure Instructions:  The drugs you were given will stay in your system until tomorrow, so for the next 24 hours you should not drive, make any legal decisions or drink any alcoholic beverages.  You may eat anything you prefer, but it is better to start with liquids then soups and crackers, and gradually work up to solid foods.  Please notify your doctor immediately if you have any unusual bleeding, trouble breathing or pain that is not related to your normal pain.  Depending on the type of procedure that was done, some parts of your body may feel week and/or numb.  This usually clears up by tonight or the next day.  Walk with the use of an assistive device or accompanied by an  on the affected area for the first 24 hours.  Put ice in a Ziploc bag and cover with a towel and place against area 15 minutes on 15 minutes off.  You may switch to heat after 24 hours. 

## 2016-07-05 NOTE — Progress Notes (Signed)
Safety precautions to be maintained throughout the outpatient stay will include: orient to surroundings, keep bed in low position, maintain call bell within reach at all times, provide assistance with transfer out of bed and ambulation.  

## 2016-07-06 ENCOUNTER — Telehealth: Payer: Self-pay

## 2016-07-06 NOTE — Progress Notes (Signed)
Verbally spoke with Dr Laban EmperorNaveira about pt falling after procedure

## 2016-07-06 NOTE — Telephone Encounter (Signed)
Pt states that after he left here he stopped at the [employer] to use restroom and fell down between the wall and commode. States his knee gave out on him and his back is sore but did not go to the ER- States two men had to help him get up. Encouraged ice/heat and if not better and needs help to call us or go to the ER,

## 2016-07-25 DIAGNOSIS — L739 Follicular disorder, unspecified: Secondary | ICD-10-CM | POA: Insufficient documentation

## 2016-08-02 ENCOUNTER — Other Ambulatory Visit: Payer: Self-pay | Admitting: Pain Medicine

## 2016-08-02 DIAGNOSIS — M792 Neuralgia and neuritis, unspecified: Secondary | ICD-10-CM

## 2016-08-03 ENCOUNTER — Other Ambulatory Visit: Payer: Self-pay | Admitting: Pain Medicine

## 2016-08-03 DIAGNOSIS — M792 Neuralgia and neuritis, unspecified: Secondary | ICD-10-CM

## 2016-08-07 NOTE — Telephone Encounter (Signed)
Note: It is the responsibility of all patients to have an up to date list of all the medications that need to be refilled at the time of their appointment.  Pain Management Medication Policy: Medication changes and refills will be conducted during appointments only, after determining if the medical indications for the use of the medication are still present. No fax, phone, or electronic refills will be conducted outside of an evaluation appointment. Patients are encouraged to call and set up an appointment to comply with stipulated policy. 

## 2016-08-10 ENCOUNTER — Ambulatory Visit: Payer: Medicare Other | Attending: Pain Medicine | Admitting: Pain Medicine

## 2016-08-10 ENCOUNTER — Encounter: Payer: Self-pay | Admitting: Pain Medicine

## 2016-08-10 VITALS — BP 163/82 | HR 59 | Temp 97.8°F | Resp 18 | Ht 68.5 in | Wt 186.0 lb

## 2016-08-10 DIAGNOSIS — R278 Other lack of coordination: Secondary | ICD-10-CM | POA: Diagnosis not present

## 2016-08-10 DIAGNOSIS — I10 Essential (primary) hypertension: Secondary | ICD-10-CM | POA: Insufficient documentation

## 2016-08-10 DIAGNOSIS — E119 Type 2 diabetes mellitus without complications: Secondary | ICD-10-CM | POA: Diagnosis not present

## 2016-08-10 DIAGNOSIS — M79604 Pain in right leg: Secondary | ICD-10-CM | POA: Diagnosis not present

## 2016-08-10 DIAGNOSIS — D869 Sarcoidosis, unspecified: Secondary | ICD-10-CM | POA: Insufficient documentation

## 2016-08-10 DIAGNOSIS — M545 Low back pain: Secondary | ICD-10-CM | POA: Insufficient documentation

## 2016-08-10 DIAGNOSIS — M4726 Other spondylosis with radiculopathy, lumbar region: Secondary | ICD-10-CM | POA: Diagnosis not present

## 2016-08-10 DIAGNOSIS — Z794 Long term (current) use of insulin: Secondary | ICD-10-CM | POA: Insufficient documentation

## 2016-08-10 DIAGNOSIS — M47896 Other spondylosis, lumbar region: Secondary | ICD-10-CM | POA: Diagnosis present

## 2016-08-10 DIAGNOSIS — G8929 Other chronic pain: Secondary | ICD-10-CM | POA: Insufficient documentation

## 2016-08-10 DIAGNOSIS — Z7982 Long term (current) use of aspirin: Secondary | ICD-10-CM | POA: Diagnosis not present

## 2016-08-10 DIAGNOSIS — F329 Major depressive disorder, single episode, unspecified: Secondary | ICD-10-CM | POA: Diagnosis not present

## 2016-08-10 DIAGNOSIS — Z79899 Other long term (current) drug therapy: Secondary | ICD-10-CM | POA: Insufficient documentation

## 2016-08-10 DIAGNOSIS — Z7952 Long term (current) use of systemic steroids: Secondary | ICD-10-CM

## 2016-08-10 DIAGNOSIS — M47816 Spondylosis without myelopathy or radiculopathy, lumbar region: Secondary | ICD-10-CM

## 2016-08-10 DIAGNOSIS — E785 Hyperlipidemia, unspecified: Secondary | ICD-10-CM | POA: Insufficient documentation

## 2016-08-10 DIAGNOSIS — Z9889 Other specified postprocedural states: Secondary | ICD-10-CM | POA: Diagnosis not present

## 2016-08-10 DIAGNOSIS — M170 Bilateral primary osteoarthritis of knee: Secondary | ICD-10-CM | POA: Insufficient documentation

## 2016-08-10 DIAGNOSIS — G510 Bell's palsy: Secondary | ICD-10-CM | POA: Diagnosis not present

## 2016-08-10 DIAGNOSIS — K5900 Constipation, unspecified: Secondary | ICD-10-CM | POA: Insufficient documentation

## 2016-08-10 DIAGNOSIS — E559 Vitamin D deficiency, unspecified: Secondary | ICD-10-CM | POA: Insufficient documentation

## 2016-08-10 DIAGNOSIS — M1288 Other specific arthropathies, not elsewhere classified, other specified site: Secondary | ICD-10-CM

## 2016-08-10 NOTE — Patient Instructions (Signed)
GENERAL RISKS AND COMPLICATIONS  What are the risk, side effects and possible complications? Generally speaking, most procedures are safe.  However, with any procedure there are risks, side effects, and the possibility of complications.  The risks and complications are dependent upon the sites that are lesioned, or the type of nerve block to be performed.  The closer the procedure is to the spine, the more serious the risks are.  Great care is taken when placing the radio frequency needles, block needles or lesioning probes, but sometimes complications can occur. 1. Infection: Any time there is an injection through the skin, there is a risk of infection.  This is why sterile conditions are used for these blocks.  There are four possible types of infection. 1. Localized skin infection. 2. Central Nervous System Infection-This can be in the form of Meningitis, which can be deadly. 3. Epidural Infections-This can be in the form of an epidural abscess, which can cause pressure inside of the spine, causing compression of the spinal cord with subsequent paralysis. This would require an emergency surgery to decompress, and there are no guarantees that the patient would recover from the paralysis. 4. Discitis-This is an infection of the intervertebral discs.  It occurs in about 1% of discography procedures.  It is difficult to treat and it may lead to surgery.        2. Pain: the needles have to go through skin and soft tissues, will cause soreness.       3. Damage to internal structures:  The nerves to be lesioned may be near blood vessels or    other nerves which can be potentially damaged.       4. Bleeding: Bleeding is more common if the patient is taking blood thinners such as  aspirin, Coumadin, Ticiid, Plavix, etc., or if he/she have some genetic predisposition  such as hemophilia. Bleeding into the spinal canal can cause compression of the spinal  cord with subsequent paralysis.  This would require an  emergency surgery to  decompress and there are no guarantees that the patient would recover from the  paralysis.       5. Pneumothorax:  Puncturing of a lung is a possibility, every time a needle is introduced in  the area of the chest or upper back.  Pneumothorax refers to free air around the  collapsed lung(s), inside of the thoracic cavity (chest cavity).  Another two possible  complications related to a similar event would include: Hemothorax and Chylothorax.   These are variations of the Pneumothorax, where instead of air around the collapsed  lung(s), you may have blood or chyle, respectively.       6. Spinal headaches: They may occur with any procedures in the area of the spine.       7. Persistent CSF (Cerebro-Spinal Fluid) leakage: This is a rare problem, but may occur  with prolonged intrathecal or epidural catheters either due to the formation of a fistulous  track or a dural tear.       8. Nerve damage: By working so close to the spinal cord, there is always a possibility of  nerve damage, which could be as serious as a permanent spinal cord injury with  paralysis.       9. Death:  Although rare, severe deadly allergic reactions known as "Anaphylactic  reaction" can occur to any of the medications used.      10. Worsening of the symptoms:  We can always make thing worse.    What are the chances of something like this happening? Chances of any of this occuring are extremely low.  By statistics, you have more of a chance of getting killed in a motor vehicle accident: while driving to the hospital than any of the above occurring .  Nevertheless, you should be aware that they are possibilities.  In general, it is similar to taking a shower.  Everybody knows that you can slip, hit your head and get killed.  Does that mean that you should not shower again?  Nevertheless always keep in mind that statistics do not mean anything if you happen to be on the wrong side of them.  Even if a procedure has a 1  (one) in a 1,000,000 (million) chance of going wrong, it you happen to be that one..Also, keep in mind that by statistics, you have more of a chance of having something go wrong when taking medications.  Who should not have this procedure? If you are on a blood thinning medication (e.g. Coumadin, Plavix, see list of "Blood Thinners"), or if you have an active infection going on, you should not have the procedure.  If you are taking any blood thinners, please inform your physician.  How should I prepare for this procedure?  Do not eat or drink anything at least six hours prior to the procedure.  Bring a driver with you .  It cannot be a taxi.  Come accompanied by an adult that can drive you back, and that is strong enough to help you if your legs get weak or numb from the local anesthetic.  Take all of your medicines the morning of the procedure with just enough water to swallow them.  If you have diabetes, make sure that you are scheduled to have your procedure done first thing in the morning, whenever possible.  If you have diabetes, take only half of your insulin dose and notify our nurse that you have done so as soon as you arrive at the clinic.  If you are diabetic, but only take blood sugar pills (oral hypoglycemic), then do not take them on the morning of your procedure.  You may take them after you have had the procedure.  Do not take aspirin or any aspirin-containing medications, at least eleven (11) days prior to the procedure.  They may prolong bleeding.  Wear loose fitting clothing that may be easy to take off and that you would not mind if it got stained with Betadine or blood.  Do not wear any jewelry or perfume  Remove any nail coloring.  It will interfere with some of our monitoring equipment.  NOTE: Remember that this is not meant to be interpreted as a complete list of all possible complications.  Unforeseen problems may occur.  BLOOD THINNERS The following drugs  contain aspirin or other products, which can cause increased bleeding during surgery and should not be taken for 2 weeks prior to and 1 week after surgery.  If you should need take something for relief of minor pain, you may take acetaminophen which is found in Tylenol,m Datril, Anacin-3 and Panadol. It is not blood thinner. The products listed below are.  Do not take any of the products listed below in addition to any listed on your instruction sheet.  A.P.C or A.P.C with Codeine Codeine Phosphate Capsules #3 Ibuprofen Ridaura  ABC compound Congesprin Imuran rimadil  Advil Cope Indocin Robaxisal  Alka-Seltzer Effervescent Pain Reliever and Antacid Coricidin or Coricidin-D  Indomethacin Rufen    Alka-Seltzer plus Cold Medicine Cosprin Ketoprofen S-A-C Tablets  Anacin Analgesic Tablets or Capsules Coumadin Korlgesic Salflex  Anacin Extra Strength Analgesic tablets or capsules CP-2 Tablets Lanoril Salicylate  Anaprox Cuprimine Capsules Levenox Salocol  Anexsia-D Dalteparin Magan Salsalate  Anodynos Darvon compound Magnesium Salicylate Sine-off  Ansaid Dasin Capsules Magsal Sodium Salicylate  Anturane Depen Capsules Marnal Soma  APF Arthritis pain formula Dewitt's Pills Measurin Stanback  Argesic Dia-Gesic Meclofenamic Sulfinpyrazone  Arthritis Bayer Timed Release Aspirin Diclofenac Meclomen Sulindac  Arthritis pain formula Anacin Dicumarol Medipren Supac  Analgesic (Safety coated) Arthralgen Diffunasal Mefanamic Suprofen  Arthritis Strength Bufferin Dihydrocodeine Mepro Compound Suprol  Arthropan liquid Dopirydamole Methcarbomol with Aspirin Synalgos  ASA tablets/Enseals Disalcid Micrainin Tagament  Ascriptin Doan's Midol Talwin  Ascriptin A/D Dolene Mobidin Tanderil  Ascriptin Extra Strength Dolobid Moblgesic Ticlid  Ascriptin with Codeine Doloprin or Doloprin with Codeine Momentum Tolectin  Asperbuf Duoprin Mono-gesic Trendar  Aspergum Duradyne Motrin or Motrin IB Triminicin  Aspirin  plain, buffered or enteric coated Durasal Myochrisine Trigesic  Aspirin Suppositories Easprin Nalfon Trillsate  Aspirin with Codeine Ecotrin Regular or Extra Strength Naprosyn Uracel  Atromid-S Efficin Naproxen Ursinus  Auranofin Capsules Elmiron Neocylate Vanquish  Axotal Emagrin Norgesic Verin  Azathioprine Empirin or Empirin with Codeine Normiflo Vitamin E  Azolid Emprazil Nuprin Voltaren  Bayer Aspirin plain, buffered or children's or timed BC Tablets or powders Encaprin Orgaran Warfarin Sodium  Buff-a-Comp Enoxaparin Orudis Zorpin  Buff-a-Comp with Codeine Equegesic Os-Cal-Gesic   Buffaprin Excedrin plain, buffered or Extra Strength Oxalid   Bufferin Arthritis Strength Feldene Oxphenbutazone   Bufferin plain or Extra Strength Feldene Capsules Oxycodone with Aspirin   Bufferin with Codeine Fenoprofen Fenoprofen Pabalate or Pabalate-SF   Buffets II Flogesic Panagesic   Buffinol plain or Extra Strength Florinal or Florinal with Codeine Panwarfarin   Buf-Tabs Flurbiprofen Penicillamine   Butalbital Compound Four-way cold tablets Penicillin   Butazolidin Fragmin Pepto-Bismol   Carbenicillin Geminisyn Percodan   Carna Arthritis Reliever Geopen Persantine   Carprofen Gold's salt Persistin   Chloramphenicol Goody's Phenylbutazone   Chloromycetin Haltrain Piroxlcam   Clmetidine heparin Plaquenil   Cllnoril Hyco-pap Ponstel   Clofibrate Hydroxy chloroquine Propoxyphen         Before stopping any of these medications, be sure to consult the physician who ordered them.  Some, such as Coumadin (Warfarin) are ordered to prevent or treat serious conditions such as "deep thrombosis", "pumonary embolisms", and other heart problems.  The amount of time that you may need off of the medication may also vary with the medication and the reason for which you were taking it.  If you are taking any of these medications, please make sure you notify your pain physician before you undergo any  procedures.         Radiofrequency Lesioning Radiofrequency lesioning is a procedure that is performed to relieve pain. The procedure is often used for back, neck, or arm pain. Radiofrequency lesioning involves the use of a machine that creates radio waves to make heat. During the procedure, the heat is applied to the nerve that carries the pain signal. The heat damages the nerve and interferes with the pain signal. Pain relief usually lasts for 6 months to 1 year. LET YOUR HEALTH CARE PROVIDER KNOW ABOUT:  Any allergies you have.  All medicines you are taking, including vitamins, herbs, eye drops, creams, and over-the-counter medicines.  Previous problems you or members of your family have had with the use of anesthetics.  Any blood disorders you have.    Previous surgeries you have had.  Any medical conditions you have.  Whether you are pregnant or may be pregnant. RISKS AND COMPLICATIONS Generally, this is a safe procedure. However, problems may occur, including:  Pain or soreness at the injection site.  Infection at the injection site.  Damage to nerves or blood vessels. BEFORE THE PROCEDURE  Ask your health care provider about:  Changing or stopping your regular medicines. This is especially important if you are taking diabetes medicines or blood thinners.  Taking medicines such as aspirin and ibuprofen. These medicines can thin your blood. Do not take these medicines before your procedure if your health care provider instructs you not to.  Follow instructions from your health care provider about eating or drinking restrictions.  Plan to have someone take you home after the procedure.  If you go home right after the procedure, plan to have someone with you for 24 hours. PROCEDURE  You will be given one or more of the following:  A medicine to help you relax (sedative).  A medicine to numb the area (local anesthetic).  You will be awake during the procedure.  You will need to be able to talk with the health care provider during the procedure.  With the help of a type of X-ray (fluoroscopy), the health care provider will insert a radiofrequency needle into the area to be treated.  Next, a wire that carries the radio waves (electrode) will be put through the radiofrequency needle. An electrical pulse will be sent through the electrode to verify the correct nerve. You will feel a tingling sensation, and you may have muscle twitching.  Then, the tissue that is around the needle tip will be heated by an electric current that is passed using the radiofrequency machine. This will numb the nerves.  A bandage (dressing) will be put on the insertion area after the procedure is done. The procedure may vary among health care providers and hospitals. AFTER THE PROCEDURE  Your blood pressure, heart rate, breathing rate, and blood oxygen level will be monitored often until the medicines you were given have worn off.  Return to your normal activities as directed by your health care provider.   This information is not intended to replace advice given to you by your health care provider. Make sure you discuss any questions you have with your health care provider.   Document Released: 05/25/2011 Document Revised: 06/17/2015 Document Reviewed: 11/03/2014 [employer] Interactive Patient Education 2016 [employer]  

## 2016-08-10 NOTE — Progress Notes (Signed)
Safety precautions to be maintained throughout the outpatient stay will include: orient to surroundings, keep bed in low position, maintain call bell within reach at all times, provide assistance with transfer out of bed and ambulation.  

## 2016-08-10 NOTE — Progress Notes (Signed)
Patient's Name: Douglas Andrews  MRN: [id number]  Referring Provider: Katharine Look, MD  DOB: 09/30/1951  PCP: Katharine Look, MD  DOS: 08/10/2016  Note by: Sydnee Levans. Laban Emperor, MD  Service setting: Ambulatory outpatient  Specialty: Interventional Pain Management  Location: [facility] (AMB) Pain Management Facility    Patient type: Established   Primary Reason(s) for Visit: Encounter for post-procedure evaluation of chronic illness with mild to moderate exacerbation CC: Back Pain (low and bilaterally equal)  HPI  [name] is a 65 y.o. year old, male patient, who comes today for an initial evaluation. He has Chronic low back pain (Location of Primary Source of Pain) (Bilateral) (L>R); Chronic pain syndrome;  Lumbar DDD (degenerative disc disease);  Lumbar Spondylosis; Lumbar radicular pain (Right) (L5 Dermatome); Lumbar facet syndrome (Location of Primary Source of Pain) (Bilateral) (R>L); Opiate use (60 MME/Day); Uncomplicated opioid dependence (HCC); Long term prescription opiate use; Chronic knee pain (Location of Tertiary source of pain) (Bilateral) (Right); Vitamin D insufficiency; Overweight; Bell palsy; CN (constipation); Dermatitis, eczematoid; Diabetes mellitus (HCC); Dysmetria; Essential (primary) hypertension; Anxiety, generalized; Insomnia due to medical condition; Long term current use of systemic steroids; Type 2 diabetes mellitus (HCC); Pulmonary sarcoidosis (HCC); Arthropathy, traumatic, shoulder; Bursitis, trochanteric; Esophageal varices (HCC); Leg varices; Hepatic cirrhosis (HCC); Major depressive disorder with single episode; Other long term (current) drug therapy; At risk for falling; Encounter for chronic pain management; Encounter for therapeutic drug level monitoring; Chronic lower extremity pain (Location of Secondary source of pain) (Right); Chronic lumbar radicular pain (Right) (L5 Dermatome); Lumbar Levoscoliosis; Chronic pain; Neurogenic pain; Osteoarthritis of knee (Right); At  high risk for falls; Chest pain; Skin lesion; Pain of left femur (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 His primarily concern today is the Back Pain (low and bilaterally equal)  Pain Assessment: Self-Reported Pain Score: 2 /10             Reported level is compatible with observation.       Pain Descriptors / Indicators: Aching, Constant Pain Frequency: Constant  [name] comes in today for post-procedure evaluation after the treatment done on 08/03/2016.  Further details on both, my assessment(s), as well as the proposed treatment plan, please see below.  Post-Procedure Assessment  08/03/2016 Procedure: Bilateral diagnostic lumbar facet block under fluoroscopic guidance and IV sedation + diagnostic right intra-articular knee joint injection Influential Factors: BMI: 27.87 kg/m Intra-procedural challenges: None observed Assessment challenges: None detected         Post-procedural side-effects, adverse reactions, or complications: None reported Reported issues: None  Sedation: Sedation provided. When no sedatives are used, the analgesic levels obtained are directly associated to the effectiveness of the local anesthetics. However, when sedation is provided, the level of analgesia obtained during the initial 1 hour following the intervention, is believed to be the result of a combination of factors. These factors may include, but are not limited to: 1. The effectiveness of the local anesthetics used. 2. The effects of the analgesic(s) and/or anxiolytic(s) used. 3. The degree of discomfort experienced by the patient at the time of the procedure. 4. The patients ability and reliability in recalling and recording the events. 5. The presence and influence of possible secondary gains and/or psychosocial factors. Reported result: Relief experienced during the 1st hour after the procedure: 100 %  (Ultra-Short Term Relief) Interpretative annotation: Analgesia during this period is likely to be Local Anesthetic and/or IV Sedative (Analgesic/Anxiolitic)  related.          Effects of local anesthetic: The analgesic effects attained during this period are directly associated to the localized infiltration of local anesthetics and therefore cary significant diagnostic value as to the etiological location, or anatomical origin, of the pain. Expected duration of relief is directly dependent on the pharmacodynamics of the local anesthetic used. Long-acting (4-6 hours) anesthetics used.  Reported result: Relief during the next 4 to 6 hour after the procedure: 100 % (Short-Term Relief) Interpretative annotation: Complete relief would suggest area to be the source of the pain.          Long-term benefit: Defined as the period of time past the expected duration of local anesthetics. With the possible exception of prolonged sympathetic blockade from the local anesthetics, benefits during this period are typically attributed to, or associated with, other factors such as analgesic sensory neuropraxia, antiinflammatory effects, or beneficial biochemical changes provided by agents other than the local anesthetics Reported result: Extended relief following procedure: 75 % (continues to get this relief today.) (Long-Term Relief) Interpretative annotation: Good relief. This could suggest inflammation to be a significant component in the etiology to the pain.          Current benefits: Defined as persistent relief that continues at this point in time.   Reported results: Treated area: 75 %       Interpretative annotation: Ongoing benefits would suggest effective therapeutic approach  Interpretation: Results would suggest a successful diagnostic intervention.           Laboratory Chemistry  Inflammation Markers Lab Results  Component Value Date   ESRSEDRATE 1 03/30/2016   CRP <0.5 03/30/2016   Renal  Function Lab Results  Component Value Date   BUN 12 03/30/2016   CREATININE 0.85 03/30/2016   GFRAA >60 03/30/2016   GFRNONAA >60 03/30/2016   Hepatic Function Lab Results  Component Value Date   AST 24 03/30/2016   ALT 27 03/30/2016   ALBUMIN 4.6 03/30/2016   Electrolytes Lab Results  Component Value Date   NA 136 03/30/2016   K 3.8 03/30/2016   CL 100 (L) 03/30/2016   CALCIUM 9.2 03/30/2016   MG 2.2 03/30/2016   Pain Modulating Vitamins Lab Results  Component Value Date   25OHVITD1 38 03/30/2016   25OHVITD2 3.4 03/30/2016   25OHVITD3 35 03/30/2016   VITAMINB12 539 03/30/2016   Coagulation Parameters No results found for: INR, LABPROT, APTT, PLT Cardiovascular No results found for: BNP, HGB, HCT Note: Lab results reviewed.  Recent Diagnostic Imaging Review  Dg C-arm 1-60 Min-no Report  Result Date: 07/05/2016 CLINICAL DATA: Sub-acute chronic pain syndrome C-ARM 1-60 MINUTES Fluoroscopy was utilized by the requesting physician.  No radiographic interpretation.   Note: Imaging results reviewed.  Meds  The patient has a current medication list which includes the following prescription(s): albuterol, albuterol, aspirin ec, atorvastatin, cetirizine, vitamin d3, gabapentin, glucose blood, hydroxyzine, ibuprofen-diphenhydramine cit, insulin glargine, insulin lispro, insulin pen needle, isosorbide mononitrate, lantus solostar, lisinopril, metformin, nitroglycerin, omeprazole, oxycodone-acetaminophen, oxycodone-acetaminophen, sertraline, and oxycodone-acetaminophen.  Current Outpatient Prescriptions on File Prior to Visit  Medication Sig  . albuterol (PROAIR HFA) 108 (90 Base) MCG/ACT inhaler INHALE 2 PUFFS BY MOUTH EVERY 4 HOURS ASNEEDED FOR WHEEZING  . albuterol (PROVENTIL) (5 MG/ML) 0.5% nebulizer solution 2.5 mg.  . aspirin EC 81 MG tablet Take 81 mg by mouth daily.  Marland Kitchen atorvastatin (LIPITOR) 40 MG tablet Take 40 mg by mouth daily.  . cetirizine (ZYRTEC) 10 MG  tablet  Take 10 mg by mouth.  . Cholecalciferol (VITAMIN D3) 5000 units TABS Take 1 tablet by mouth daily.   Marland Kitchen. gabapentin (NEURONTIN) 600 MG tablet Take 1 tablet (600 mg total) by mouth every 8 (eight) hours.  Marland Kitchen. glucose blood (ACCU-CHEK ACTIVE STRIPS) test strip Checks 3 times daily  . hydrOXYzine (VISTARIL) 25 MG capsule Take 50 mg by mouth.  . Ibuprofen-Diphenhydramine Cit (ADVIL PM PO) Take 1 tablet by mouth at bedtime.  . insulin glargine (LANTUS) 100 UNIT/ML injection Inject 60 Units into the skin at bedtime.   . insulin lispro (HUMALOG) 100 UNIT/ML KiwkPen Inject 25 Units into the skin 2 (two) times daily.   . Insulin Pen Needle (UNIFINE PENTIPS) 31G X 6 MM MISC   . isosorbide mononitrate (IMDUR) 30 MG 24 hr tablet 30 mg daily.   Marland Kitchen. LANTUS SOLOSTAR 100 UNIT/ML Solostar Pen Inject 60 Units into the skin daily at 10 pm.   . lisinopril (PRINIVIL,ZESTRIL) 10 MG tablet Take 10 mg by mouth.  . metFORMIN (GLUCOPHAGE) 500 MG tablet Take 1,000 mg by mouth 2 (two) times daily with a meal.  . nitroGLYCERIN (NITROSTAT) 0.4 MG SL tablet Place 0.4 mg under the tongue every 5 (five) minutes as needed for chest pain.  Marland Kitchen. omeprazole (PRILOSEC) 20 MG capsule Take 20 mg by mouth daily.  Marland Kitchen. oxyCODONE-acetaminophen (PERCOCET) 10-325 MG tablet Take 1 tablet by mouth every 6 (six) hours as needed for pain.  Melene Muller. [START ON 08/28/2016] oxyCODONE-acetaminophen (PERCOCET) 10-325 MG tablet Take 1 tablet by mouth every 6 (six) hours as needed for pain.  Marland Kitchen. oxyCODONE-acetaminophen (PERCOCET) 10-325 MG tablet Take 1 tablet by mouth every 6 (six) hours as needed for pain.   No current facility-administered medications on file prior to visit.    ROS  Constitutional: Denies any fever or chills Gastrointestinal: No reported hemesis, hematochezia, vomiting, or acute GI distress Musculoskeletal: Denies any acute onset joint swelling, redness, loss of ROM, or weakness Neurological: No reported episodes of acute onset apraxia, aphasia,  dysarthria, agnosia, amnesia, paralysis, loss of coordination, or loss of consciousness  Allergies  [name] has No Known Allergies.  PFSH  Drug: [name]  reports that he does not use drugs. Alcohol:  reports that he does not drink alcohol. Tobacco:  reports that he has never smoked. He has never used smokeless tobacco. Medical:  has a past medical history of Chronic back pain; Diabetes mellitus without complication (HCC); Hyperlipidemia; Hypertension; Left leg pain (01/02/2014); Right leg pain (07/15/2015); and Sarcoidosis (HCC). Family: family history includes Diabetes in his father; Heart disease in his father and mother; Stroke in his father.  Past Surgical History:  Procedure Laterality Date  . APPENDECTOMY    . left rod    . TONSILLECTOMY     Constitutional Exam  General appearance: Well nourished, well developed, and well hydrated. In no apparent acute distress Vitals:   08/10/16 0918  BP: (!) 163/82  Pulse: (!) 59  Resp: 18  Temp: 97.8 F (36.6 C)  TempSrc: Oral  SpO2: 100%  Weight: 186 lb (84.4 kg)  Height: 5' 8.5" (1.74 m)   BMI Assessment: Estimated body mass index is 27.87 kg/m as calculated from the following:   Height as of this encounter: 5' 8.5" (1.74 m).   Weight as of this encounter: 186 lb (84.4 kg).  BMI interpretation table: BMI level Category Range association with higher incidence of chronic pain  <18 kg/m2 Underweight   18.5-24.9 kg/m2 Ideal body weight  25-29.9 kg/m2 Overweight Increased incidence by 20%  30-34.9 kg/m2 Obese (Class I) Increased incidence by 68%  35-39.9 kg/m2 Severe obesity (Class II) Increased incidence by 136%  >40 kg/m2 Extreme obesity (Class III) Increased incidence by 254%   BMI Readings from Last 4 Encounters:  08/10/16 27.87 kg/m  07/05/16 27.87 kg/m  06/29/16 28.28 kg/m  03/30/16 27.37 kg/m   Wt Readings from Last 4 Encounters:  08/10/16 186 lb (84.4 kg)  07/05/16 186 lb (84.4 kg)  06/29/16 186 lb (84.4  kg)  03/30/16 180 lb (81.6 kg)  Psych/Mental status: Alert, oriented x 3 (person, place, & time) Eyes: PERLA Respiratory: No evidence of acute respiratory distress  Cervical Spine Exam  Inspection: No masses, redness, or swelling Alignment: Symmetrical Functional ROM: Unrestricted ROM Stability: No instability detected Muscle strength & Tone: Functionally intact Sensory: Unimpaired Palpation: Non-contributory  Upper Extremity (UE) Exam    Side: Right upper extremity  Side: Left upper extremity  Inspection: No masses, redness, swelling, or asymmetry  Inspection: No masses, redness, swelling, or asymmetry  Functional ROM: Unrestricted ROM         Functional ROM: Unrestricted ROM          Muscle strength & Tone: Functionally intact  Muscle strength & Tone: Functionally intact  Sensory: Unimpaired  Sensory: Unimpaired  Palpation: Non-contributory  Palpation: Non-contributory   Thoracic Spine Exam  Inspection: No masses, redness, or swelling Alignment: Symmetrical Functional ROM: Unrestricted ROM Stability: No instability detected Sensory: Unimpaired Muscle strength & Tone: Functionally intact Palpation: Non-contributory  Lumbar Spine Exam  Inspection: No masses, redness, or swelling Alignment: Symmetrical Functional ROM: Unrestricted ROM Stability: No instability detected Muscle strength & Tone: Functionally intact Sensory: Unimpaired Palpation: Non-contributory Provocative Tests: Lumbar Hyperextension and rotation test: evaluation deferred today       Patrick's Maneuver: evaluation deferred today              Gait & Posture Assessment  Ambulation: Unassisted Gait: Relatively normal for age and body habitus Posture: WNL   Lower Extremity Exam    Side: Right lower extremity  Side: Left lower extremity  Inspection: No masses, redness, swelling, or asymmetry  Inspection: No masses, redness, swelling, or asymmetry  Functional ROM: Unrestricted ROM          Functional ROM:  Unrestricted ROM          Muscle strength & Tone: Functionally intact  Muscle strength & Tone: Functionally intact  Sensory: Unimpaired  Sensory: Unimpaired  Palpation: Non-contributory  Palpation: Non-contributory   Assessment  Primary Diagnosis & Pertinent Problem List: The primary encounter diagnosis was  Lumbar Spondylosis. Diagnoses of Lumbar facet syndrome (Location of Primary Source of Pain) (Bilateral) (R>L) and Long term current use of systemic steroids were also pertinent to this visit.  Visit Diagnosis: 1.  Lumbar Spondylosis   2. Lumbar facet syndrome (Location of Primary Source of Pain) (Bilateral) (R>L)   3. Long term current use of systemic steroids    Plan of Care  Pharmacotherapy (Medications Ordered): No orders of the defined types were placed in this encounter.  New Prescriptions   No medications on file   Medications administered during this visit: Mr. Stclair had no medications administered during this visit. Lab-work, Procedure(s), & Referral(s) Ordered: Orders Placed This Encounter  Procedures  . LUMBAR FACET(MEDIAL BRANCH NERVE BLOCK) MBNB   Imaging & Referral(s) Ordered: None  Interventional Therapies: Pending/Scheduled/Planned:   Right Hyalgan knee injection #2 + Diagnostic bilateral lumbar facet block #2 under fluoro and  IV sedation.   Considering:   Diagnostic bilateral Lumbar facet block under fluoroscopic guidance and IV sedation.  Possible bilateral lumbar facet radiofrequency ablation, depending on the results of the diagnostic injection.  Diagnostic right intra-articular hip injection under fluoroscopic guidance, with a without sedation.  Possible radiofrequency of the right hip joint depending on the diagnostic injection.  Diagnostic Right L4-5 lumbar epidural steroid injection under fluoroscopic guidance, with or without sedation.  Diagnostic bilateral intra-articular knee injection, without fluoroscopic guidance and IV sedation.  Possible  series of 5 Hyalgan knee injections.  Diagnostic bilateral Genicular nerve block under fluoroscopic guidance, with a without sedation.  Possible bilateral Genicular nerve radiofrequency ablation depending on the results of the diagnostic injection.    PRN Procedures:   Diagnostic bilateral Lumbar facet block under fluoroscopic guidance and IV sedation.  Diagnostic right intra-articular hip injection under fluoroscopic guidance, with a without sedation.  Diagnostic Right L4-5 lumbar epidural steroid injection under fluoroscopic guidance, with or without sedation.  Diagnostic bilateral intra-articular knee injection, without fluoroscopic guidance and IV sedation.  Possible series of 5 Hyalgan knee injections.  Diagnostic bilateral Genicular nerve block under fluoroscopic guidance, with a without sedation.    Requested PM Follow-up: Return for Keep prior appointment, In addition, (PRN) Procedure.  Future Appointments Date Time Provider Department Center  09/22/2016 11:15 AM Delano Metz, MD [facility] None   Primary Care Physician: Katharine Look, MD Location: [facility] Note by: Sydnee Levans Laban Emperor, M.D, DABA, DABAPM, DABPM, DABIPP, FIPP  Pain Score Disclaimer: We use the NRS-11 scale. This is a self-reported, subjective measurement of pain severity with only modest accuracy. It is used primarily to identify changes within a particular patient. It must be understood that outpatient pain scales are significantly less accurate that those used for research, where they can be applied under ideal controlled circumstances with minimal exposure to variables. In reality, the score is likely to be a combination of pain intensity and pain affect, where pain affect describes the degree of emotional arousal or changes in action readiness caused by the sensory experience of pain. Factors such as social and work situation, setting, emotional state, anxiety levels,  expectation, and prior pain experience may influence pain perception and show large inter-individual differences that may also be affected by time variables.  Patient instructions provided during this appointment: Patient Instructions   GENERAL RISKS AND COMPLICATIONS  What are the risk, side effects and possible complications? Generally speaking, most procedures are safe.  However, with any procedure there are risks, side effects, and the possibility of complications.  The risks and complications are dependent upon the sites that are lesioned, or the type of nerve block to be performed.  The closer the procedure is to the spine, the more serious the risks are.  Great care is taken when placing the radio frequency needles, block needles or lesioning probes, but sometimes complications can occur. 1. Infection: Any time there is an injection through the skin, there is a risk of infection.  This is why sterile conditions are used for these blocks.  There are four possible types of infection. 1. Localized skin infection. 2. Central Nervous System Infection-This can be in the form of Meningitis, which can be deadly. 3. Epidural Infections-This can be in the form of an epidural abscess, which can cause pressure inside of the spine, causing compression of the spinal cord with subsequent paralysis. This would require an emergency surgery to decompress, and there are no guarantees that the  patient would recover from the paralysis. 4. Discitis-This is an infection of the intervertebral discs.  It occurs in about 1% of discography procedures.  It is difficult to treat and it may lead to surgery.        2. Pain: the needles have to go through skin and soft tissues, will cause soreness.       3. Damage to internal structures:  The nerves to be lesioned may be near blood vessels or    other nerves which can be potentially damaged.       4. Bleeding: Bleeding is more common if the patient is taking blood thinners  such as  aspirin, Coumadin, Ticiid, Plavix, etc., or if he/she have some genetic predisposition  such as hemophilia. Bleeding into the spinal canal can cause compression of the spinal  cord with subsequent paralysis.  This would require an emergency surgery to  decompress and there are no guarantees that the patient would recover from the  paralysis.       5. Pneumothorax:  Puncturing of a lung is a possibility, every time a needle is introduced in  the area of the chest or upper back.  Pneumothorax refers to free air around the  collapsed lung(s), inside of the thoracic cavity (chest cavity).  Another two possible  complications related to a similar event would include: Hemothorax and Chylothorax.   These are variations of the Pneumothorax, where instead of air around the collapsed  lung(s), you may have blood or chyle, respectively.       6. Spinal headaches: They may occur with any procedures in the area of the spine.       7. Persistent CSF (Cerebro-Spinal Fluid) leakage: This is a rare problem, but may occur  with prolonged intrathecal or epidural catheters either due to the formation of a fistulous  track or a dural tear.       8. Nerve damage: By working so close to the spinal cord, there is always a possibility of  nerve damage, which could be as serious as a permanent spinal cord injury with  paralysis.       9. Death:  Although rare, severe deadly allergic reactions known as "Anaphylactic  reaction" can occur to any of the medications used.      10. Worsening of the symptoms:  We can always make thing worse.  What are the chances of something like this happening? Chances of any of this occuring are extremely low.  By statistics, you have more of a chance of getting killed in a motor vehicle accident: while driving to the hospital than any of the above occurring .  Nevertheless, you should be aware that they are possibilities.  In general, it is similar to taking a shower.  Everybody knows that you  can slip, hit your head and get killed.  Does that mean that you should not shower again?  Nevertheless always keep in mind that statistics do not mean anything if you happen to be on the wrong side of them.  Even if a procedure has a 1 (one) in a 1,000,000 (million) chance of going wrong, it you happen to be that one..Also, keep in mind that by statistics, you have more of a chance of having something go wrong when taking medications.  Who should not have this procedure? If you are on a blood thinning medication (e.g. Coumadin, Plavix, see list of "Blood Thinners"), or if you have an active infection going on, you should  not have the procedure.  If you are taking any blood thinners, please inform your physician.  How should I prepare for this procedure?  Do not eat or drink anything at least six hours prior to the procedure.  Bring a driver with you .  It cannot be a taxi.  Come accompanied by an adult that can drive you back, and that is strong enough to help you if your legs get weak or numb from the local anesthetic.  Take all of your medicines the morning of the procedure with just enough water to swallow them.  If you have diabetes, make sure that you are scheduled to have your procedure done first thing in the morning, whenever possible.  If you have diabetes, take only half of your insulin dose and notify our nurse that you have done so as soon as you arrive at the clinic.  If you are diabetic, but only take blood sugar pills (oral hypoglycemic), then do not take them on the morning of your procedure.  You may take them after you have had the procedure.  Do not take aspirin or any aspirin-containing medications, at least eleven (11) days prior to the procedure.  They may prolong bleeding.  Wear loose fitting clothing that may be easy to take off and that you would not mind if it got stained with Betadine or blood.  Do not wear any jewelry or perfume  Remove any nail coloring.  It  will interfere with some of our monitoring equipment.  NOTE: Remember that this is not meant to be interpreted as a complete list of all possible complications.  Unforeseen problems may occur.  BLOOD THINNERS The following drugs contain aspirin or other products, which can cause increased bleeding during surgery and should not be taken for 2 weeks prior to and 1 week after surgery.  If you should need take something for relief of minor pain, you may take acetaminophen which is found in Tylenol,m Datril, Anacin-3 and Panadol. It is not blood thinner. The products listed below are.  Do not take any of the products listed below in addition to any listed on your instruction sheet.  A.P.C or A.P.C with Codeine Codeine Phosphate Capsules #3 Ibuprofen Ridaura  ABC compound Congesprin Imuran rimadil  Advil Cope Indocin Robaxisal  Alka-Seltzer Effervescent Pain Reliever and Antacid Coricidin or Coricidin-D  Indomethacin Rufen  Alka-Seltzer plus Cold Medicine Cosprin Ketoprofen S-A-C Tablets  Anacin Analgesic Tablets or Capsules Coumadin Korlgesic Salflex  Anacin Extra Strength Analgesic tablets or capsules CP-2 Tablets Lanoril Salicylate  Anaprox Cuprimine Capsules Levenox Salocol  Anexsia-D Dalteparin Magan Salsalate  Anodynos Darvon compound Magnesium Salicylate Sine-off  Ansaid Dasin Capsules Magsal Sodium Salicylate  Anturane Depen Capsules Marnal Soma  APF Arthritis pain formula Dewitt's Pills Measurin Stanback  Argesic Dia-Gesic Meclofenamic Sulfinpyrazone  Arthritis Bayer Timed Release Aspirin Diclofenac Meclomen Sulindac  Arthritis pain formula Anacin Dicumarol Medipren Supac  Analgesic (Safety coated) Arthralgen Diffunasal Mefanamic Suprofen  Arthritis Strength Bufferin Dihydrocodeine Mepro Compound Suprol  Arthropan liquid Dopirydamole Methcarbomol with Aspirin Synalgos  ASA tablets/Enseals Disalcid Micrainin Tagament  Ascriptin Doan's Midol Talwin  Ascriptin A/D Dolene Mobidin Tanderil   Ascriptin Extra Strength Dolobid Moblgesic Ticlid  Ascriptin with Codeine Doloprin or Doloprin with Codeine Momentum Tolectin  Asperbuf Duoprin Mono-gesic Trendar  Aspergum Duradyne Motrin or Motrin IB Triminicin  Aspirin plain, buffered or enteric coated Durasal Myochrisine Trigesic  Aspirin Suppositories Easprin Nalfon Trillsate  Aspirin with Codeine Ecotrin Regular or Extra Strength Naprosyn Uracel  Atromid-S Efficin Naproxen Ursinus  Auranofin Capsules Elmiron Neocylate Vanquish  Axotal Emagrin Norgesic Verin  Azathioprine Empirin or Empirin with Codeine Normiflo Vitamin E  Azolid Emprazil Nuprin Voltaren  Bayer Aspirin plain, buffered or children's or timed BC Tablets or powders Encaprin Orgaran Warfarin Sodium  Buff-a-Comp Enoxaparin Orudis Zorpin  Buff-a-Comp with Codeine Equegesic Os-Cal-Gesic   Buffaprin Excedrin plain, buffered or Extra Strength Oxalid   Bufferin Arthritis Strength Feldene Oxphenbutazone   Bufferin plain or Extra Strength Feldene Capsules Oxycodone with Aspirin   Bufferin with Codeine Fenoprofen Fenoprofen Pabalate or Pabalate-SF   Buffets II Flogesic Panagesic   Buffinol plain or Extra Strength Florinal or Florinal with Codeine Panwarfarin   Buf-Tabs Flurbiprofen Penicillamine   Butalbital Compound Four-way cold tablets Penicillin   Butazolidin Fragmin Pepto-Bismol   Carbenicillin Geminisyn Percodan   Carna Arthritis Reliever Geopen Persantine   Carprofen Gold's salt Persistin   Chloramphenicol Goody's Phenylbutazone   Chloromycetin Haltrain Piroxlcam   Clmetidine heparin Plaquenil   Cllnoril Hyco-pap Ponstel   Clofibrate Hydroxy chloroquine Propoxyphen         Before stopping any of these medications, be sure to consult the physician who ordered them.  Some, such as Coumadin (Warfarin) are ordered to prevent or treat serious conditions such as "deep thrombosis", "pumonary embolisms", and other heart problems.  The amount of time that you may need off  of the medication may also vary with the medication and the reason for which you were taking it.  If you are taking any of these medications, please make sure you notify your pain physician before you undergo any procedures.         Radiofrequency Lesioning Radiofrequency lesioning is a procedure that is performed to relieve pain. The procedure is often used for back, neck, or arm pain. Radiofrequency lesioning involves the use of a machine that creates radio waves to make heat. During the procedure, the heat is applied to the nerve that carries the pain signal. The heat damages the nerve and interferes with the pain signal. Pain relief usually lasts for 6 months to 1 year. LET [facility] CARE PROVIDER KNOW ABOUT:  Any allergies you have.  All medicines you are taking, including vitamins, herbs, eye drops, creams, and over-the-counter medicines.  Previous problems you or members of your family have had with the use of anesthetics.  Any blood disorders you have.  Previous surgeries you have had.  Any medical conditions you have.  Whether you are pregnant or may be pregnant. RISKS AND COMPLICATIONS Generally, this is a safe procedure. However, problems may occur, including:  Pain or soreness at the injection site.  Infection at the injection site.  Damage to nerves or blood vessels. BEFORE THE PROCEDURE  Ask your health care provider about:  Changing or stopping your regular medicines. This is especially important if you are taking diabetes medicines or blood thinners.  Taking medicines such as aspirin and ibuprofen. These medicines can thin your blood. Do not take these medicines before your procedure if your health care provider instructs you not to.  Follow instructions from your health care provider about eating or drinking restrictions.  Plan to have someone take you home after the procedure.  If you go home right after the procedure, plan to have someone with you  for 24 hours. PROCEDURE  You will be given one or more of the following:  A medicine to help you relax (sedative).  A medicine to numb the area (local anesthetic).  You will  be awake during the procedure. You will need to be able to talk with the health care provider during the procedure.  With the help of a type of X-ray (fluoroscopy), the health care provider will insert a radiofrequency needle into the area to be treated.  Next, a wire that carries the radio waves (electrode) will be put through the radiofrequency needle. An electrical pulse will be sent through the electrode to verify the correct nerve. You will feel a tingling sensation, and you may have muscle twitching.  Then, the tissue that is around the needle tip will be heated by an electric current that is passed using the radiofrequency machine. This will numb the nerves.  A bandage (dressing) will be put on the insertion area after the procedure is done. The procedure may vary among health care providers and hospitals. AFTER THE PROCEDURE  Your blood pressure, heart rate, breathing rate, and blood oxygen level will be monitored often until the medicines you were given have worn off.  Return to your normal activities as directed by your health care provider.   This information is not intended to replace advice given to you by your health care provider. Make sure you discuss any questions you have with your health care provider.   Document Released: 05/25/2011 Document Revised: 06/17/2015 Document Reviewed: 11/03/2014 [employer] Interactive Patient Education [employer]

## 2016-08-22 ENCOUNTER — Ambulatory Visit: Payer: Medicare Other | Attending: Pain Medicine | Admitting: Pain Medicine

## 2016-08-22 ENCOUNTER — Ambulatory Visit
Admission: RE | Admit: 2016-08-22 | Discharge: 2016-08-22 | Disposition: A | Discharge status: Home / Self Care | Payer: Medicare Other | Source: Ambulatory Visit | Attending: Pain Medicine | Admitting: Pain Medicine

## 2016-08-22 ENCOUNTER — Encounter: Payer: Self-pay | Admitting: Pain Medicine

## 2016-08-22 VITALS — BP 167/97 | HR 70 | Temp 97.8°F | Resp 15 | Ht 68.5 in | Wt 181.0 lb

## 2016-08-22 DIAGNOSIS — M47816 Spondylosis without myelopathy or radiculopathy, lumbar region: Secondary | ICD-10-CM

## 2016-08-22 DIAGNOSIS — I10 Essential (primary) hypertension: Secondary | ICD-10-CM | POA: Diagnosis not present

## 2016-08-22 DIAGNOSIS — Z9889 Other specified postprocedural states: Secondary | ICD-10-CM | POA: Insufficient documentation

## 2016-08-22 DIAGNOSIS — M546 Pain in thoracic spine: Secondary | ICD-10-CM

## 2016-08-22 DIAGNOSIS — E559 Vitamin D deficiency, unspecified: Secondary | ICD-10-CM | POA: Insufficient documentation

## 2016-08-22 DIAGNOSIS — M1288 Other specific arthropathies, not elsewhere classified, other specified site: Secondary | ICD-10-CM

## 2016-08-22 DIAGNOSIS — M5442 Lumbago with sciatica, left side: Secondary | ICD-10-CM | POA: Insufficient documentation

## 2016-08-22 DIAGNOSIS — G8929 Other chronic pain: Secondary | ICD-10-CM | POA: Diagnosis not present

## 2016-08-22 DIAGNOSIS — M4184 Other forms of scoliosis, thoracic region: Secondary | ICD-10-CM | POA: Diagnosis not present

## 2016-08-22 DIAGNOSIS — M5440 Lumbago with sciatica, unspecified side: Secondary | ICD-10-CM

## 2016-08-22 DIAGNOSIS — F329 Major depressive disorder, single episode, unspecified: Secondary | ICD-10-CM | POA: Diagnosis not present

## 2016-08-22 DIAGNOSIS — Z79899 Other long term (current) drug therapy: Secondary | ICD-10-CM | POA: Insufficient documentation

## 2016-08-22 DIAGNOSIS — M545 Low back pain, unspecified: Secondary | ICD-10-CM | POA: Insufficient documentation

## 2016-08-22 DIAGNOSIS — M48061 Spinal stenosis, lumbar region without neurogenic claudication: Secondary | ICD-10-CM | POA: Insufficient documentation

## 2016-08-22 DIAGNOSIS — G510 Bell's palsy: Secondary | ICD-10-CM | POA: Diagnosis not present

## 2016-08-22 DIAGNOSIS — M25552 Pain in left hip: Secondary | ICD-10-CM | POA: Insufficient documentation

## 2016-08-22 DIAGNOSIS — M160 Bilateral primary osteoarthritis of hip: Secondary | ICD-10-CM | POA: Diagnosis not present

## 2016-08-22 DIAGNOSIS — M47894 Other spondylosis, thoracic region: Secondary | ICD-10-CM | POA: Diagnosis not present

## 2016-08-22 DIAGNOSIS — E785 Hyperlipidemia, unspecified: Secondary | ICD-10-CM | POA: Diagnosis not present

## 2016-08-22 DIAGNOSIS — M4316 Spondylolisthesis, lumbar region: Secondary | ICD-10-CM | POA: Diagnosis not present

## 2016-08-22 DIAGNOSIS — M79604 Pain in right leg: Secondary | ICD-10-CM | POA: Diagnosis not present

## 2016-08-22 DIAGNOSIS — M533 Sacrococcygeal disorders, not elsewhere classified: Secondary | ICD-10-CM | POA: Insufficient documentation

## 2016-08-22 DIAGNOSIS — D869 Sarcoidosis, unspecified: Secondary | ICD-10-CM | POA: Insufficient documentation

## 2016-08-22 DIAGNOSIS — I70208 Unspecified atherosclerosis of native arteries of extremities, other extremity: Secondary | ICD-10-CM | POA: Insufficient documentation

## 2016-08-22 DIAGNOSIS — Z794 Long term (current) use of insulin: Secondary | ICD-10-CM | POA: Diagnosis not present

## 2016-08-22 DIAGNOSIS — E119 Type 2 diabetes mellitus without complications: Secondary | ICD-10-CM | POA: Diagnosis not present

## 2016-08-22 DIAGNOSIS — Z7952 Long term (current) use of systemic steroids: Secondary | ICD-10-CM | POA: Diagnosis not present

## 2016-08-22 DIAGNOSIS — K5900 Constipation, unspecified: Secondary | ICD-10-CM | POA: Diagnosis not present

## 2016-08-22 DIAGNOSIS — R278 Other lack of coordination: Secondary | ICD-10-CM | POA: Diagnosis not present

## 2016-08-22 DIAGNOSIS — Z7982 Long term (current) use of aspirin: Secondary | ICD-10-CM | POA: Insufficient documentation

## 2016-08-22 NOTE — Progress Notes (Signed)
Safety precautions to be maintained throughout the outpatient stay will include: orient to surroundings, keep bed in low position, maintain call bell within reach at all times, provide assistance with transfer out of bed and ambulation.  

## 2016-08-22 NOTE — Progress Notes (Signed)
Patient's Name: Douglas Andrews  MRN: [id number]  Referring Provider: Delano Metz, MD  DOB: 1951/02/28  PCP: Katharine Look, MD  DOS: 08/22/2016  Note by: Sydnee Levans. Laban Emperor, MD  Service setting: Ambulatory outpatient  Specialty: Interventional Pain Management  Location: [facility] (AMB) Pain Management Facility    Patient type: Established   Primary Reason(s) for Visit: Evaluation of chronic illnesses with exacerbation, or progression (Level of risk: moderate) CC: Back Pain (left) and Hip Pain (left)  HPI  Douglas Andrews is a 65 y.o. year old, male patient, who comes today for a follow-up evaluation. He has Chronic low back pain (Location of Primary Source of Pain) (Bilateral) (L>R); Chronic pain syndrome;  Lumbar DDD (degenerative disc disease);  Lumbar Spondylosis; Lumbar radicular pain (Right) (L5 Dermatome); Lumbar facet syndrome (Location of Primary Source of Pain) (Bilateral) (R>L); Opiate use (60 MME/Day); Uncomplicated opioid dependence (HCC); Long term prescription opiate use; Chronic knee pain (Location of Tertiary source of pain) (Bilateral) (Right); Vitamin D insufficiency; Overweight; Bell palsy; CN (constipation); Dermatitis, eczematoid; Diabetes mellitus (HCC); Dysmetria; Essential (primary) hypertension; Anxiety, generalized; Insomnia due to medical condition; Long term current use of systemic steroids; Type 2 diabetes mellitus (HCC); Pulmonary sarcoidosis (HCC); Arthropathy, traumatic, shoulder; Bursitis, trochanteric; Esophageal varices (HCC); Leg varices; Hepatic cirrhosis (HCC); Major depressive disorder with single episode; Other long term (current) drug therapy; At risk for falling; Encounter for chronic pain management; Encounter for therapeutic drug level monitoring; Chronic lower extremity pain (Location of Secondary source of pain) (Right); Chronic lumbar radicular pain (Right) (L5 Dermatome); Lumbar Levoscoliosis; Chronic pain; Neurogenic pain; Osteoarthritis of knee (Right); At  high risk for falls; Chest pain; Skin lesion; Pain of left femur (left femoral rod due to sarcoidosis); Chronic hip pain (Right); Osteoarthritis of knee (Bilateral) (R>L); Essential hypertension; Folliculitis; Acute low back pain; Chronic left sacroiliac joint pain; Chronic hip pain, left; and Chronic left-sided thoracic back pain on his problem list. Douglas Andrews was last seen on 08/10/2016. His primarily concern today is the Back Pain (left) and Hip Pain (left)  Pain Assessment: Self-Reported Pain Score: 5 /10             Reported level is compatible with observation.       Pain Type: Chronic pain Pain Location: Back Pain Orientation: Lower Pain Descriptors / Indicators: Constant, Aching  The patient comes in today clinics today scheduled for a diagnostic bilateral lumbar facet block #2 under fluoroscopic guidance and IV sedation plus right-sided Hyalgan knee injection #2. He did not bring a driver and he indicates that after the last procedure he fell and injured to the left side of his lower back. He has been having quite a bit of pain to the point were he has needed to go void but he has not been able to get out of bed and ended up having an episode of incontinence. Today's evaluation revealed exquisite tenderness to any type of movement of his left hip or SI joint area. He comes in walking with a cane, which is unusual for him. Because of this, I will be canceling today's procedure and I will be sending him to have some x-rays done at the affected areas. I will see him back for a possible diagnostic left intra-articular SI joint injection and hip joint injection under fluoroscopic guidance and IV sedation. The patient will be reminded today to bring his driver.  Further details on both, my assessment(s), as well as the proposed treatment plan, please see below.  Laboratory Chemistry  Inflammation Markers Lab Results  Component Value Date   ESRSEDRATE 1 03/30/2016   CRP <0.5 03/30/2016   Renal  Function Lab Results  Component Value Date   BUN 12 03/30/2016   CREATININE 0.85 03/30/2016   GFRAA >60 03/30/2016   GFRNONAA >60 03/30/2016   Hepatic Function Lab Results  Component Value Date   AST 24 03/30/2016   ALT 27 03/30/2016   ALBUMIN 4.6 03/30/2016   Electrolytes Lab Results  Component Value Date   NA 136 03/30/2016   K 3.8 03/30/2016   CL 100 (L) 03/30/2016   CALCIUM 9.2 03/30/2016   MG 2.2 03/30/2016   Pain Modulating Vitamins Lab Results  Component Value Date   25OHVITD1 38 03/30/2016   25OHVITD2 3.4 03/30/2016   25OHVITD3 35 03/30/2016   VITAMINB12 539 03/30/2016   Coagulation Parameters No results found for: INR, LABPROT, APTT, PLT Cardiovascular No results found for: BNP, HGB, HCT Note: Lab results reviewed.  Recent Diagnostic Imaging Review  Lumbosacral Imaging: Lumbar MR wo contrast:  Results for orders placed during the hospital encounter of 05/03/16  MR Lumbar Spine Wo Contrast   Narrative CLINICAL DATA:  Low back pain for over 10 years radiating into the right leg to the knee. No known injury. Initial encounter. EXAM: MRI LUMBAR SPINE WITHOUT CONTRAST TECHNIQUE: Multiplanar, multisequence MR imaging of the lumbar spine was performed. No intravenous contrast was administered. COMPARISON:  None. FINDINGS: Segmentation:  Unremarkable. Alignment: Trace anterolisthesis L5 on S1 due to facet degenerative disease is seen. Vertebrae: No fracture or worrisome marrow lesion. Scattered Schmorl's nodes are noted. Conus medullaris: Extends to the L1 level and appears normal. Paraspinal and other soft tissues: Unremarkable. Disc levels: T12-L1:  Negative. L1-2:  Negative. L2-3:  Negative. L3-4: Small right paracentral disc protrusion causes mild narrowing the right lateral recess. The disc slightly deflects the descending right L4 root without compressing it. The foramina are open. L4-5: Mild ligamentum flavum thickening and facet  degenerative disease. Minimal disc bulge without central canal or foraminal stenosis. L5-S1: There is facet degenerative change. The disc is uncovered with a shallow bulge. The central canal and foramina are widely patent. IMPRESSION: Small right side disc protrusion at L3-4 slightly deflects the descending right L4 root without compression. Mild disc bulging L4-5 and L5-S1 without central canal or foraminal stenosis. Electronically Signed   By: Drusilla Kanner M.D.   On: 05/03/2016 09:17   Note: Imaging results reviewed.  Meds  The patient has a current medication list which includes the following prescription(s): albuterol, albuterol, aspirin ec, atorvastatin, cetirizine, vitamin d3, gabapentin, glucose blood, hydroxyzine, ibuprofen-diphenhydramine cit, insulin glargine, insulin lispro, insulin pen needle, isosorbide mononitrate, lantus solostar, lisinopril, metformin, nitroglycerin, omeprazole, oxycodone-acetaminophen, oxycodone-acetaminophen, sertraline, and oxycodone-acetaminophen.  Current Outpatient Prescriptions on File Prior to Visit  Medication Sig  . albuterol (PROAIR HFA) 108 (90 Base) MCG/ACT inhaler INHALE 2 PUFFS BY MOUTH EVERY 4 HOURS ASNEEDED FOR WHEEZING  . albuterol (PROVENTIL) (5 MG/ML) 0.5% nebulizer solution Take 2.5 mg by nebulization every 6 (six) hours as needed for wheezing or shortness of breath.   Marland Kitchen aspirin EC 81 MG tablet Take 81 mg by mouth daily.  Marland Kitchen atorvastatin (LIPITOR) 40 MG tablet Take 40 mg by mouth daily.  . cetirizine (ZYRTEC) 10 MG tablet Take 10 mg by mouth daily.   . Cholecalciferol (VITAMIN D3) 5000 units TABS Take 1 tablet by mouth daily.   Marland Kitchen gabapentin (NEURONTIN) 600 MG tablet Take 1 tablet (600 mg total) by mouth every 8 (  eight) hours.  Marland Kitchen glucose blood (ACCU-CHEK ACTIVE STRIPS) test strip Checks 3 times daily  . hydrOXYzine (VISTARIL) 25 MG capsule Take 50 mg by mouth every 6 (six) hours as needed.   . Ibuprofen-Diphenhydramine Cit (ADVIL  PM PO) Take 1 tablet by mouth at bedtime.  . insulin glargine (LANTUS) 100 UNIT/ML injection Inject 60 Units into the skin at bedtime.   . insulin lispro (HUMALOG) 100 UNIT/ML KiwkPen Inject 25 Units into the skin 2 (two) times daily.   . Insulin Pen Needle (UNIFINE PENTIPS) 31G X 6 MM MISC   . isosorbide mononitrate (IMDUR) 30 MG 24 hr tablet 30 mg daily.   Marland Kitchen LANTUS SOLOSTAR 100 UNIT/ML Solostar Pen Inject 60 Units into the skin daily at 10 pm.   . lisinopril (PRINIVIL,ZESTRIL) 10 MG tablet Take 10 mg by mouth daily.   . metFORMIN (GLUCOPHAGE) 500 MG tablet Take 1,000 mg by mouth 2 (two) times daily with a meal.  . nitroGLYCERIN (NITROSTAT) 0.4 MG SL tablet Place 0.4 mg under the tongue every 5 (five) minutes as needed for chest pain.  Marland Kitchen omeprazole (PRILOSEC) 20 MG capsule Take 20 mg by mouth daily.  Marland Kitchen oxyCODONE-acetaminophen (PERCOCET) 10-325 MG tablet Take 1 tablet by mouth every 6 (six) hours as needed for pain.  Melene Muller ON 08/28/2016] oxyCODONE-acetaminophen (PERCOCET) 10-325 MG tablet Take 1 tablet by mouth every 6 (six) hours as needed for pain.  Marland Kitchen sertraline (ZOLOFT) 50 MG tablet TAKE 1 TABLET BY MOUTH ONCE DAILY  . oxyCODONE-acetaminophen (PERCOCET) 10-325 MG tablet Take 1 tablet by mouth every 6 (six) hours as needed for pain.   No current facility-administered medications on file prior to visit.    ROS  Constitutional: Denies any fever or chills Gastrointestinal: No reported hemesis, hematochezia, vomiting, or acute GI distress Musculoskeletal: Denies any acute onset joint swelling, redness, loss of ROM, or weakness Neurological: No reported episodes of acute onset apraxia, aphasia, dysarthria, agnosia, amnesia, paralysis, loss of coordination, or loss of consciousness  Allergies  Mr. Livers has No Known Allergies.  PFSH  Drug: Mr. Neville  reports that he does not use drugs. Alcohol:  reports that he does not drink alcohol. Tobacco:  reports that he has never smoked. He has  never used smokeless tobacco. Medical:  has a past medical history of Chronic back pain; Diabetes mellitus without complication (HCC); Hyperlipidemia; Hypertension; Left leg pain (01/02/2014); Right leg pain (07/15/2015); and Sarcoidosis (HCC). Family: family history includes Diabetes in his father; Heart disease in his father and mother; Stroke in his father.  Past Surgical History:  Procedure Laterality Date  . APPENDECTOMY    . left rod    . TONSILLECTOMY     Constitutional Exam  General appearance: Well nourished, well developed, and well hydrated. In no apparent acute distress Vitals:   08/22/16 0751  BP: (!) 167/97  Pulse: 70  Resp: 15  Temp: 97.8 F (36.6 C)  TempSrc: Oral  SpO2: 100%  Weight: 181 lb (82.1 kg)  Height: 5' 8.5" (1.74 m)   BMI Assessment: Estimated body mass index is 27.12 kg/m as calculated from the following:   Height as of this encounter: 5' 8.5" (1.74 m).   Weight as of this encounter: 181 lb (82.1 kg).  BMI interpretation table: BMI level Category Range association with higher incidence of chronic pain  <18 kg/m2 Underweight   18.5-24.9 kg/m2 Ideal body weight   25-29.9 kg/m2 Overweight Increased incidence by 20%  30-34.9 kg/m2 Obese (Class I) Increased incidence  by 68%  35-39.9 kg/m2 Severe obesity (Class II) Increased incidence by 136%  >40 kg/m2 Extreme obesity (Class III) Increased incidence by 254%   BMI Readings from Last 4 Encounters:  08/22/16 27.12 kg/m  08/10/16 27.87 kg/m  07/05/16 27.87 kg/m  06/29/16 28.28 kg/m   Wt Readings from Last 4 Encounters:  08/22/16 181 lb (82.1 kg)  08/10/16 186 lb (84.4 kg)  07/05/16 186 lb (84.4 kg)  06/29/16 186 lb (84.4 kg)  Psych/Mental status: Alert, oriented x 3 (person, place, & time) Eyes: PERLA Respiratory: No evidence of acute respiratory distress  Cervical Spine Exam  Inspection: No masses, redness, or swelling Alignment: Symmetrical Functional ROM: Unrestricted ROM Stability: No  instability detected Muscle strength & Tone: Functionally intact Sensory: Unimpaired Palpation: Non-contributory  Upper Extremity (UE) Exam    Side: Right upper extremity  Side: Left upper extremity  Inspection: No masses, redness, swelling, or asymmetry  Inspection: No masses, redness, swelling, or asymmetry  Functional ROM: Unrestricted ROM         Functional ROM: Unrestricted ROM          Muscle strength & Tone: Functionally intact  Muscle strength & Tone: Functionally intact  Sensory: Unimpaired  Sensory: Unimpaired  Palpation: Non-contributory  Palpation: Non-contributory   Thoracic Spine Exam  Inspection: No masses, redness, or swelling Alignment: Symmetrical Functional ROM: Unrestricted ROM Stability: No instability detected Sensory: Unimpaired Muscle strength & Tone: Functionally intact Palpation: Non-contributory  Lumbar Spine Exam  Inspection: No masses, redness, or swelling Alignment: Symmetrical Functional ROM: Minimal ROM Stability: No instability detected Muscle strength & Tone: Functionally intact Sensory: Movement-associated pain Palpation: Complains of area being tender to palpation Provocative Tests: Lumbar Hyperextension and rotation test: Positive on the left for facet joint pain. Patrick's Maneuver: Positive for left-sided S-I joint pain and for left hip joint pain.  Gait & Posture Assessment  Ambulation: Patient ambulates using a cane Gait: Very limited, using assistive device to ambulate Posture: Antalgic   Lower Extremity Exam    Side: Right lower extremity  Side: Left lower extremity  Inspection: No masses, redness, swelling, or asymmetry  Inspection: No masses, redness, swelling, or asymmetry  Functional ROM: Unrestricted ROM          Functional ROM: Minimal ROM for hip joint  Muscle strength & Tone: Functionally intact  Muscle strength & Tone: Guarding  Sensory: Unimpaired  Sensory: Articular pain pattern  Palpation: Non-contributory  Palpation:  Tender over the area of the trochanteric bursa and the PSIS.    Assessment  Primary Diagnosis & Pertinent Problem List: The primary encounter diagnosis was Acute left-sided low back pain with sciatica, sciatica laterality unspecified. Diagnoses of Lumbar facet syndrome (Location of Primary Source of Pain) (Bilateral) (R>L), Chronic left sacroiliac joint pain, Chronic hip pain, left, and Chronic left-sided thoracic back pain were also pertinent to this visit.  Visit Diagnosis: 1. Acute left-sided low back pain with sciatica, sciatica laterality unspecified   2. Lumbar facet syndrome (Location of Primary Source of Pain) (Bilateral) (R>L)   3. Chronic left sacroiliac joint pain   4. Chronic hip pain, left   5. Chronic left-sided thoracic back pain    Plan of Care  Pharmacotherapy (Medications Ordered): No orders of the defined types were placed in this encounter.  New Prescriptions   No medications on file   Medications administered today: [name] had no medications administered during this visit. Lab-work, procedure(s), and/or referral(s): Orders Placed This Encounter  Procedures  . SACROILIAC JOINT INJECTINS  .  HIP INJECTION  . DG Lumbar Spine Complete  . DG Si Joints  . DG Thoracic Spine 2 View  . DG HIP UNILAT W OR W/O PELVIS 2-3 VIEWS LEFT   Imaging and/or referral(s): DG LUMBAR SPINE COMPLETE 4 +V DG SI JOINTS DG THORACIC SPINE 2 VIEW DG HIP UNILAT W OR W/O PELVIS 2-3 VIEWS LEFT  Interventional therapies: Planned, scheduled, and/or pending:   Left diagnostic sacroiliac joint block + left intra-articular hip joint injection under fluoroscopic guidance and IV sedation.  Possible Right Hyalgan knee injection #2 + Diagnostic bilateral lumbar facet block #2 under fluoro and IV sedation.   Considering:   Diagnostic bilateral Lumbar facet block under fluoroscopic guidance and IV sedation.  Possible bilateral lumbar facet radiofrequency ablation, depending on the results  of the diagnostic injection.  Diagnostic right intra-articular hip injection under fluoroscopic guidance, with a without sedation.  Possible radiofrequency of the right hip joint depending on the diagnostic injection.  Diagnostic Right L4-5 lumbar epidural steroid injection under fluoroscopic guidance, with or without sedation.  Diagnostic bilateral intra-articular knee injection, without fluoroscopic guidance and IV sedation.  Possible series of 5 Hyalgan knee injections.  Diagnostic bilateral Genicular nerve block under fluoroscopic guidance, with a without sedation.  Possible bilateral Genicular nerve radiofrequency ablation depending on the results of the diagnostic injection.    Palliative PRN treatment(s):   Diagnostic bilateral Lumbar facet block under fluoroscopic guidance and IV sedation.  Diagnostic right intra-articular hip injection under fluoroscopic guidance, with a without sedation.  Diagnostic Right L4-5 lumbar epidural steroid injection under fluoroscopic guidance, with or without sedation.  Diagnostic bilateral intra-articular knee injection, without fluoroscopic guidance and IV sedation.  Possible series of 5 Hyalgan knee injections.  Diagnostic bilateral Genicular nerve block under fluoroscopic guidance, with a without sedation.    Provider-requested follow-up: Return for procedure, (ASAP).  Future Appointments Date Time Provider Department Center  09/05/2016 10:15 AM Delano Metz, MD [facility] None  09/22/2016 11:15 AM Delano Metz, MD [facility] None   Primary Care Physician: Katharine Look, MD Location: [facility] Outpatient Pain Management Facility Note by: Sydnee Levans. Laban Emperor, M.D, DABA, DABAPM, DABPM, DABIPP, FIPP  Pain Score Disclaimer: We use the NRS-11 scale. This is a self-reported, subjective measurement of pain severity with only modest accuracy. It is used primarily to identify changes within a particular patient. It must be understood that  outpatient pain scales are significantly less accurate that those used for research, where they can be applied under ideal controlled circumstances with minimal exposure to variables. In reality, the score is likely to be a combination of pain intensity and pain affect, where pain affect describes the degree of emotional arousal or changes in action readiness caused by the sensory experience of pain. Factors such as social and work situation, setting, emotional state, anxiety levels, expectation, and prior pain experience may influence pain perception and show large inter-individual differences that may also be affected by time variables.  Patient instructions provided during this appointment: Patient Instructions   Pain Management Discharge Instructions  General Discharge Instructions :  If you need to reach your doctor call: Monday-Friday 8:00 am - 4:00 pm at [phone] or toll free [phone].  After clinic hours [phone] to have operator reach doctor.  Bring all of your medication bottles to all your appointments in the pain clinic.  To cancel or reschedule your appointment with Pain Management please remember to call 24 hours in advance to avoid a fee.  Refer to the educational materials which you  have been given on: General Risks, I had my Procedure. Discharge Instructions, Post Sedation.  Post Procedure Instructions:  The drugs you were given will stay in your system until tomorrow, so for the next 24 hours you should not drive, make any legal decisions or drink any alcoholic beverages.  You may eat anything you prefer, but it is better to start with liquids then soups and crackers, and gradually work up to solid foods.  Please notify your doctor immediately if you have any unusual bleeding, trouble breathing or pain that is not related to your normal pain.  Depending on the type of procedure that was done, some parts of your body may feel week and/or numb.  This usually  clears up by tonight or the next day.  Walk with the use of an assistive device or accompanied by an adult for the 24 hours.  You may use ice on the affected area for the first 24 hours.  Put ice in a Ziploc bag and cover with a towel and place against area 15 minutes on 15 minutes off.  You may switch to heat after 24 hours.Sacroiliac (SI) Joint Injection Patient Information  Description: The sacroiliac joint connects the scrum (very low back and tailbone) to the ilium (a pelvic bone which also forms half of the hip joint).  Normally this joint experiences very little motion.  When this joint becomes inflamed or unstable low back and or hip and pelvis pain may result.  Injection of this joint with local anesthetics (numbing medicines) and steroids can provide diagnostic information and reduce pain.  This injection is performed with the aid of x-ray guidance into the tailbone area while you are lying on your stomach.   You may experience an electrical sensation down the leg while this is being done.  You may also experience numbness.  We also may ask if we are reproducing your normal pain during the injection.  Conditions which may be treated SI injection:   Low back, buttock, hip or leg pain  Preparation for the Injection:  1. Do not eat any solid food or dairy products within 8 hours of your appointment.  2. You may drink clear liquids up to 3 hours before appointment.  Clear liquids include water, black coffee, juice or soda.  No milk or cream please. 3. You may take your regular medications, including pain medications with a sip of water before your appointment.  Diabetics should hold regular insulin (if take separately) and take 1/2 normal NPH dose the morning of the procedure.  Carry some sugar containing items with you to your appointment. 4. A driver must accompany you and be prepared to drive you home after your procedure. 5. Bring all of your current medications with you. 6. An IV may  be inserted and sedation may be given at the discretion of the physician. 7. A blood pressure cuff, EKG and other monitors will often be applied during the procedure.  Some patients may need to have extra oxygen administered for a short period.  8. You will be asked to provide medical information, including your allergies, prior to the procedure.  We must know immediately if you are taking blood thinners (like Coumadin/Warfarin) or if you are allergic to IV iodine contrast (dye).  We must know if you could possible be pregnant.  Possible side effects:   Bleeding from needle site  Infection (rare, may require surgery)  Nerve injury (rare)  Numbness & tingling (temporary)  A brief convulsion  or seizure  Light-headedness (temporary)  Pain at injection site (several days)  Decreased blood pressure (temporary)  Weakness in the leg (temporary)   Call if you experience:   New onset weakness or numbness of an extremity below the injection site that last more than 8 hours.  Hives or difficulty breathing ( go to the emergency room)  Inflammation or drainage at the injection site  Any new symptoms which are concerning to you  Please note:  Although the local anesthetic injected can often make your back/ hip/ buttock/ leg feel good for several hours after the injections, the pain will likely return.  It takes 3-7 days for steroids to work in the sacroiliac area.  You may not notice any pain relief for at least that one week.  If effective, we will often do a series of three injections spaced 3-6 weeks apart to maximally decrease your pain.  After the initial series, we generally will wait some months before a repeat injection of the same type.  If you have any questions, please call [phone] [facility]  GENERAL RISKS AND COMPLICATIONS  What are the risk, side effects and possible complications? Generally speaking, most procedures are safe.   However, with any procedure there are risks, side effects, and the possibility of complications.  The risks and complications are dependent upon the sites that are lesioned, or the type of nerve block to be performed.  The closer the procedure is to the spine, the more serious the risks are.  Great care is taken when placing the radio frequency needles, block needles or lesioning probes, but sometimes complications can occur. 1. Infection: Any time there is an injection through the skin, there is a risk of infection.  This is why sterile conditions are used for these blocks.  There are four possible types of infection. 1. Localized skin infection. 2. Central Nervous System Infection-This can be in the form of Meningitis, which can be deadly. 3. Epidural Infections-This can be in the form of an epidural abscess, which can cause pressure inside of the spine, causing compression of the spinal cord with subsequent paralysis. This would require an emergency surgery to decompress, and there are no guarantees that the patient would recover from the paralysis. 4. Discitis-This is an infection of the intervertebral discs.  It occurs in about 1% of discography procedures.  It is difficult to treat and it may lead to surgery.        2. Pain: the needles have to go through skin and soft tissues, will cause soreness.       3. Damage to internal structures:  The nerves to be lesioned may be near blood vessels or    other nerves which can be potentially damaged.       4. Bleeding: Bleeding is more common if the patient is taking blood thinners such as  aspirin, Coumadin, Ticiid, Plavix, etc., or if he/she have some genetic predisposition  such as hemophilia. Bleeding into the spinal canal can cause compression of the spinal  cord with subsequent paralysis.  This would require an emergency surgery to  decompress and there are no guarantees that the patient would recover from the  paralysis.       5. Pneumothorax:   Puncturing of a lung is a possibility, every time a needle is introduced in  the area of the chest or upper back.  Pneumothorax refers to free air around the  collapsed lung(s), inside of  the thoracic cavity (chest cavity).  Another two possible  complications related to a similar event would include: Hemothorax and Chylothorax.   These are variations of the Pneumothorax, where instead of air around the collapsed  lung(s), you may have blood or chyle, respectively.       6. Spinal headaches: They may occur with any procedures in the area of the spine.       7. Persistent CSF (Cerebro-Spinal Fluid) leakage: This is a rare problem, but may occur  with prolonged intrathecal or epidural catheters either due to the formation of a fistulous  track or a dural tear.       8. Nerve damage: By working so close to the spinal cord, there is always a possibility of  nerve damage, which could be as serious as a permanent spinal cord injury with  paralysis.       9. Death:  Although rare, severe deadly allergic reactions known as "Anaphylactic  reaction" can occur to any of the medications used.      10. Worsening of the symptoms:  We can always make thing worse.  What are the chances of something like this happening? Chances of any of this occuring are extremely low.  By statistics, you have more of a chance of getting killed in a motor vehicle accident: while driving to the hospital than any of the above occurring .  Nevertheless, you should be aware that they are possibilities.  In general, it is similar to taking a shower.  Everybody knows that you can slip, hit your head and get killed.  Does that mean that you should not shower again?  Nevertheless always keep in mind that statistics do not mean anything if you happen to be on the wrong side of them.  Even if a procedure has a 1 (one) in a 1,000,000 (million) chance of going wrong, it you happen to be that one..Also, keep in mind that by statistics, you have more of  a chance of having something go wrong when taking medications.  Who should not have this procedure? If you are on a blood thinning medication (e.g. Coumadin, Plavix, see list of "Blood Thinners"), or if you have an active infection going on, you should not have the procedure.  If you are taking any blood thinners, please inform your physician.  How should I prepare for this procedure?  Do not eat or drink anything at least six hours prior to the procedure.  Bring a driver with you .  It cannot be a taxi.  Come accompanied by an adult that can drive you back, and that is strong enough to help you if your legs get weak or numb from the local anesthetic.  Take all of your medicines the morning of the procedure with just enough water to swallow them.  If you have diabetes, make sure that you are scheduled to have your procedure done first thing in the morning, whenever possible.  If you have diabetes, take only half of your insulin dose and notify our nurse that you have done so as soon as you arrive at the clinic.  If you are diabetic, but only take blood sugar pills (oral hypoglycemic), then do not take them on the morning of your procedure.  You may take them after you have had the procedure.  Do not take aspirin or any aspirin-containing medications, at least eleven (11) days prior to the procedure.  They may prolong bleeding.  Wear loose fitting clothing  that may be easy to take off and that you would not mind if it got stained with Betadine or blood.  Do not wear any jewelry or perfume  Remove any nail coloring.  It will interfere with some of our monitoring equipment.  NOTE: Remember that this is not meant to be interpreted as a complete list of all possible complications.  Unforeseen problems may occur.  BLOOD THINNERS The following drugs contain aspirin or other products, which can cause increased bleeding during surgery and should not be taken for 2 weeks prior to and 1 week  after surgery.  If you should need take something for relief of minor pain, you may take acetaminophen which is found in Tylenol,m Datril, Anacin-3 and Panadol. It is not blood thinner. The products listed below are.  Do not take any of the products listed below in addition to any listed on your instruction sheet.  A.P.C or A.P.C with Codeine Codeine Phosphate Capsules #3 Ibuprofen Ridaura  ABC compound Congesprin Imuran rimadil  Advil Cope Indocin Robaxisal  Alka-Seltzer Effervescent Pain Reliever and Antacid Coricidin or Coricidin-D  Indomethacin Rufen  Alka-Seltzer plus Cold Medicine Cosprin Ketoprofen S-A-C Tablets  Anacin Analgesic Tablets or Capsules Coumadin Korlgesic Salflex  Anacin Extra Strength Analgesic tablets or capsules CP-2 Tablets Lanoril Salicylate  Anaprox Cuprimine Capsules Levenox Salocol  Anexsia-D Dalteparin Magan Salsalate  Anodynos Darvon compound Magnesium Salicylate Sine-off  Ansaid Dasin Capsules Magsal Sodium Salicylate  Anturane Depen Capsules Marnal Soma  APF Arthritis pain formula Dewitt's Pills Measurin Stanback  Argesic Dia-Gesic Meclofenamic Sulfinpyrazone  Arthritis Bayer Timed Release Aspirin Diclofenac Meclomen Sulindac  Arthritis pain formula Anacin Dicumarol Medipren Supac  Analgesic (Safety coated) Arthralgen Diffunasal Mefanamic Suprofen  Arthritis Strength Bufferin Dihydrocodeine Mepro Compound Suprol  Arthropan liquid Dopirydamole Methcarbomol with Aspirin Synalgos  ASA tablets/Enseals Disalcid Micrainin Tagament  Ascriptin Doan's Midol Talwin  Ascriptin A/D Dolene Mobidin Tanderil  Ascriptin Extra Strength Dolobid Moblgesic Ticlid  Ascriptin with Codeine Doloprin or Doloprin with Codeine Momentum Tolectin  Asperbuf Duoprin Mono-gesic Trendar  Aspergum Duradyne Motrin or Motrin IB Triminicin  Aspirin plain, buffered or enteric coated Durasal Myochrisine Trigesic  Aspirin Suppositories Easprin Nalfon Trillsate  Aspirin with Codeine Ecotrin  Regular or Extra Strength Naprosyn Uracel  Atromid-S Efficin Naproxen Ursinus  Auranofin Capsules Elmiron Neocylate Vanquish  Axotal Emagrin Norgesic Verin  Azathioprine Empirin or Empirin with Codeine Normiflo Vitamin E  Azolid Emprazil Nuprin Voltaren  Bayer Aspirin plain, buffered or children's or timed BC Tablets or powders Encaprin Orgaran Warfarin Sodium  Buff-a-Comp Enoxaparin Orudis Zorpin  Buff-a-Comp with Codeine Equegesic Os-Cal-Gesic   Buffaprin Excedrin plain, buffered or Extra Strength Oxalid   Bufferin Arthritis Strength Feldene Oxphenbutazone   Bufferin plain or Extra Strength Feldene Capsules Oxycodone with Aspirin   Bufferin with Codeine Fenoprofen Fenoprofen Pabalate or Pabalate-SF   Buffets II Flogesic Panagesic   Buffinol plain or Extra Strength Florinal or Florinal with Codeine Panwarfarin   Buf-Tabs Flurbiprofen Penicillamine   Butalbital Compound Four-way cold tablets Penicillin   Butazolidin Fragmin Pepto-Bismol   Carbenicillin Geminisyn Percodan   Carna Arthritis Reliever Geopen Persantine   Carprofen Gold's salt Persistin   Chloramphenicol Goody's Phenylbutazone   Chloromycetin Haltrain Piroxlcam   Clmetidine heparin Plaquenil   Cllnoril Hyco-pap Ponstel   Clofibrate Hydroxy chloroquine Propoxyphen         Before stopping any of these medications, be sure to consult the physician who ordered them.  Some, such as Coumadin (Warfarin) are ordered to prevent or treat serious conditions  such as "deep thrombosis", "pumonary embolisms", and other heart problems.  The amount of time that you may need off of the medication may also vary with the medication and the reason for which you were taking it.  If you are taking any of these medications, please make sure you notify your pain physician before you undergo any procedures.

## 2016-08-22 NOTE — Patient Instructions (Addendum)
Pain Management Discharge Instructions  General Discharge Instructions :  If you need to reach your doctor call: Monday-Friday 8:00 am - 4:00 pm at [phone] or toll free [phone].  After clinic hours [phone] to have operator reach doctor.  Bring all of your medication bottles to all your appointments in the pain clinic.  To cancel or reschedule your appointment with Pain Management please remember to call 24 hours in advance to avoid a fee.  Refer to the educational materials which you have been given on: General Risks, I had my Procedure. Discharge Instructions, Post Sedation.  Post Procedure Instructions:  The drugs you were given will stay in your system until tomorrow, so for the next 24 hours you should not drive, make any legal decisions or drink any alcoholic beverages.  You may eat anything you prefer, but it is better to start with liquids then soups and crackers, and gradually work up to solid foods.  Please notify your doctor immediately if you have any unusual bleeding, trouble breathing or pain that is not related to your normal pain.  Depending on the type of procedure that was done, some parts of your body may feel week and/or numb.  This usually clears up by tonight or the next day.  Walk with the use of an assistive device or accompanied by an adult for the 24 hours.  You may use ice on the affected area for the first 24 hours.  Put ice in a Ziploc bag and cover with a towel and place against area 15 minutes on 15 minutes off.  You may switch to heat after 24 hours.Sacroiliac (SI) Joint Injection Patient Information  Description: The sacroiliac joint connects the scrum (very low back and tailbone) to the ilium (a pelvic bone which also forms half of the hip joint).  Normally this joint experiences very little motion.  When this joint becomes inflamed or unstable low back and or hip and pelvis pain may result.  Injection of this joint with local anesthetics  (numbing medicines) and steroids can provide diagnostic information and reduce pain.  This injection is performed with the aid of x-ray guidance into the tailbone area while you are lying on your stomach.   You may experience an electrical sensation down the leg while this is being done.  You may also experience numbness.  We also may ask if we are reproducing your normal pain during the injection.  Conditions which may be treated SI injection:   Low back, buttock, hip or leg pain  Preparation for the Injection:  1. Do not eat any solid food or dairy products within 8 hours of your appointment.  2. You may drink clear liquids up to 3 hours before appointment.  Clear liquids include water, black coffee, juice or soda.  No milk or cream please. 3. You may take your regular medications, including pain medications with a sip of water before your appointment.  Diabetics should hold regular insulin (if take separately) and take 1/2 normal NPH dose the morning of the procedure.  Carry some sugar containing items with you to your appointment. 4. A driver must accompany you and be prepared to drive you home after your procedure. 5. Bring all of your current medications with you. 6. An IV may be inserted and sedation may be given at the discretion of the physician. 7. A blood pressure cuff, EKG and other monitors will often be applied during the procedure.  Some patients may need to have extra oxygen   administered for a short period.  8. You will be asked to provide medical information, including your allergies, prior to the procedure.  We must know immediately if you are taking blood thinners (like Coumadin/Warfarin) or if you are allergic to IV iodine contrast (dye).  We must know if you could possible be pregnant.  Possible side effects:   Bleeding from needle site  Infection (rare, may require surgery)  Nerve injury (rare)  Numbness & tingling (temporary)  A brief convulsion or  seizure  Light-headedness (temporary)  Pain at injection site (several days)  Decreased blood pressure (temporary)  Weakness in the leg (temporary)   Call if you experience:   New onset weakness or numbness of an extremity below the injection site that last more than 8 hours.  Hives or difficulty breathing ( go to the emergency room)  Inflammation or drainage at the injection site  Any new symptoms which are concerning to you  Please note:  Although the local anesthetic injected can often make your back/ hip/ buttock/ leg feel good for several hours after the injections, the pain will likely return.  It takes 3-7 days for steroids to work in the sacroiliac area.  You may not notice any pain relief for at least that one week.  If effective, we will often do a series of three injections spaced 3-6 weeks apart to maximally decrease your pain.  After the initial series, we generally will wait some months before a repeat injection of the same type.  If you have any questions, please call [phone] [facility]  GENERAL RISKS AND COMPLICATIONS  What are the risk, side effects and possible complications? Generally speaking, most procedures are safe.  However, with any procedure there are risks, side effects, and the possibility of complications.  The risks and complications are dependent upon the sites that are lesioned, or the type of nerve block to be performed.  The closer the procedure is to the spine, the more serious the risks are.  Great care is taken when placing the radio frequency needles, block needles or lesioning probes, but sometimes complications can occur. 1. Infection: Any time there is an injection through the skin, there is a risk of infection.  This is why sterile conditions are used for these blocks.  There are four possible types of infection. 1. Localized skin infection. 2. Central Nervous System Infection-This can be in the  form of Meningitis, which can be deadly. 3. Epidural Infections-This can be in the form of an epidural abscess, which can cause pressure inside of the spine, causing compression of the spinal cord with subsequent paralysis. This would require an emergency surgery to decompress, and there are no guarantees that the patient would recover from the paralysis. 4. Discitis-This is an infection of the intervertebral discs.  It occurs in about 1% of discography procedures.  It is difficult to treat and it may lead to surgery.        2. Pain: the needles have to go through skin and soft tissues, will cause soreness.       3. Damage to internal structures:  The nerves to be lesioned may be near blood vessels or    other nerves which can be potentially damaged.       4. Bleeding: Bleeding is more common if the patient is taking blood thinners such as  aspirin, Coumadin, Ticiid, Plavix, etc., or if he/she have some genetic predisposition  such as hemophilia. Bleeding   into the spinal canal can cause compression of the spinal  cord with subsequent paralysis.  This would require an emergency surgery to  decompress and there are no guarantees that the patient would recover from the  paralysis.       5. Pneumothorax:  Puncturing of a lung is a possibility, every time a needle is introduced in  the area of the chest or upper back.  Pneumothorax refers to free air around the  collapsed lung(s), inside of the thoracic cavity (chest cavity).  Another two possible  complications related to a similar event would include: Hemothorax and Chylothorax.   These are variations of the Pneumothorax, where instead of air around the collapsed  lung(s), you may have blood or chyle, respectively.       6. Spinal headaches: They may occur with any procedures in the area of the spine.       7. Persistent CSF (Cerebro-Spinal Fluid) leakage: This is a rare problem, but may occur  with prolonged intrathecal or epidural catheters either due to  the formation of a fistulous  track or a dural tear.       8. Nerve damage: By working so close to the spinal cord, there is always a possibility of  nerve damage, which could be as serious as a permanent spinal cord injury with  paralysis.       9. Death:  Although rare, severe deadly allergic reactions known as "Anaphylactic  reaction" can occur to any of the medications used.      10. Worsening of the symptoms:  We can always make thing worse.  What are the chances of something like this happening? Chances of any of this occuring are extremely low.  By statistics, you have more of a chance of getting killed in a motor vehicle accident: while driving to the hospital than any of the above occurring .  Nevertheless, you should be aware that they are possibilities.  In general, it is similar to taking a shower.  Everybody knows that you can slip, hit your head and get killed.  Does that mean that you should not shower again?  Nevertheless always keep in mind that statistics do not mean anything if you happen to be on the wrong side of them.  Even if a procedure has a 1 (one) in a 1,000,000 (million) chance of going wrong, it you happen to be that one..Also, keep in mind that by statistics, you have more of a chance of having something go wrong when taking medications.  Who should not have this procedure? If you are on a blood thinning medication (e.g. Coumadin, Plavix, see list of "Blood Thinners"), or if you have an active infection going on, you should not have the procedure.  If you are taking any blood thinners, please inform your physician.  How should I prepare for this procedure?  Do not eat or drink anything at least six hours prior to the procedure.  Bring a driver with you .  It cannot be a taxi.  Come accompanied by an adult that can drive you back, and that is strong enough to help you if your legs get weak or numb from the local anesthetic.  Take all of your medicines the morning of  the procedure with just enough water to swallow them.  If you have diabetes, make sure that you are scheduled to have your procedure done first thing in the morning, whenever possible.  If you have diabetes, take only   half of your insulin dose and notify our nurse that you have done so as soon as you arrive at the clinic.  If you are diabetic, but only take blood sugar pills (oral hypoglycemic), then do not take them on the morning of your procedure.  You may take them after you have had the procedure.  Do not take aspirin or any aspirin-containing medications, at least eleven (11) days prior to the procedure.  They may prolong bleeding.  Wear loose fitting clothing that may be easy to take off and that you would not mind if it got stained with Betadine or blood.  Do not wear any jewelry or perfume  Remove any nail coloring.  It will interfere with some of our monitoring equipment.  NOTE: Remember that this is not meant to be interpreted as a complete list of all possible complications.  Unforeseen problems may occur.  BLOOD THINNERS The following drugs contain aspirin or other products, which can cause increased bleeding during surgery and should not be taken for 2 weeks prior to and 1 week after surgery.  If you should need take something for relief of minor pain, you may take acetaminophen which is found in Tylenol,m Datril, Anacin-3 and Panadol. It is not blood thinner. The products listed below are.  Do not take any of the products listed below in addition to any listed on your instruction sheet.  A.P.C or A.P.C with Codeine Codeine Phosphate Capsules #3 Ibuprofen Ridaura  ABC compound Congesprin Imuran rimadil  Advil Cope Indocin Robaxisal  Alka-Seltzer Effervescent Pain Reliever and Antacid Coricidin or Coricidin-D  Indomethacin Rufen  Alka-Seltzer plus Cold Medicine Cosprin Ketoprofen S-A-C Tablets  Anacin Analgesic Tablets or Capsules Coumadin Korlgesic Salflex  Anacin Extra  Strength Analgesic tablets or capsules CP-2 Tablets Lanoril Salicylate  Anaprox Cuprimine Capsules Levenox Salocol  Anexsia-D Dalteparin Magan Salsalate  Anodynos Darvon compound Magnesium Salicylate Sine-off  Ansaid Dasin Capsules Magsal Sodium Salicylate  Anturane Depen Capsules Marnal Soma  APF Arthritis pain formula Dewitt's Pills Measurin Stanback  Argesic Dia-Gesic Meclofenamic Sulfinpyrazone  Arthritis Bayer Timed Release Aspirin Diclofenac Meclomen Sulindac  Arthritis pain formula Anacin Dicumarol Medipren Supac  Analgesic (Safety coated) Arthralgen Diffunasal Mefanamic Suprofen  Arthritis Strength Bufferin Dihydrocodeine Mepro Compound Suprol  Arthropan liquid Dopirydamole Methcarbomol with Aspirin Synalgos  ASA tablets/Enseals Disalcid Micrainin Tagament  Ascriptin Doan's Midol Talwin  Ascriptin A/D Dolene Mobidin Tanderil  Ascriptin Extra Strength Dolobid Moblgesic Ticlid  Ascriptin with Codeine Doloprin or Doloprin with Codeine Momentum Tolectin  Asperbuf Duoprin Mono-gesic Trendar  Aspergum Duradyne Motrin or Motrin IB Triminicin  Aspirin plain, buffered or enteric coated Durasal Myochrisine Trigesic  Aspirin Suppositories Easprin Nalfon Trillsate  Aspirin with Codeine Ecotrin Regular or Extra Strength Naprosyn Uracel  Atromid-S Efficin Naproxen Ursinus  Auranofin Capsules Elmiron Neocylate Vanquish  Axotal Emagrin Norgesic Verin  Azathioprine Empirin or Empirin with Codeine Normiflo Vitamin E  Azolid Emprazil Nuprin Voltaren  Bayer Aspirin plain, buffered or children's or timed BC Tablets or powders Encaprin Orgaran Warfarin Sodium  Buff-a-Comp Enoxaparin Orudis Zorpin  Buff-a-Comp with Codeine Equegesic Os-Cal-Gesic   Buffaprin Excedrin plain, buffered or Extra Strength Oxalid   Bufferin Arthritis Strength Feldene Oxphenbutazone   Bufferin plain or Extra Strength Feldene Capsules Oxycodone with Aspirin   Bufferin with Codeine Fenoprofen Fenoprofen Pabalate or  Pabalate-SF   Buffets II Flogesic Panagesic   Buffinol plain or Extra Strength Florinal or Florinal with Codeine Panwarfarin   Buf-Tabs Flurbiprofen Penicillamine   Butalbital Compound Four-way cold tablets Penicillin     Butazolidin Fragmin Pepto-Bismol   Carbenicillin Geminisyn Percodan   Carna Arthritis Reliever Geopen Persantine   Carprofen Gold's salt Persistin   Chloramphenicol Goody's Phenylbutazone   Chloromycetin Haltrain Piroxlcam   Clmetidine heparin Plaquenil   Cllnoril Hyco-pap Ponstel   Clofibrate Hydroxy chloroquine Propoxyphen         Before stopping any of these medications, be sure to consult the physician who ordered them.  Some, such as Coumadin (Warfarin) are ordered to prevent or treat serious conditions such as "deep thrombosis", "pumonary embolisms", and other heart problems.  The amount of time that you may need off of the medication may also vary with the medication and the reason for which you were taking it.  If you are taking any of these medications, please make sure you notify your pain physician before you undergo any procedures.          

## 2016-08-25 ENCOUNTER — Other Ambulatory Visit: Payer: Self-pay | Admitting: Pain Medicine

## 2016-08-25 DIAGNOSIS — M792 Neuralgia and neuritis, unspecified: Secondary | ICD-10-CM

## 2016-09-05 ENCOUNTER — Encounter: Payer: Self-pay | Admitting: Pain Medicine

## 2016-09-05 ENCOUNTER — Ambulatory Visit (HOSPITAL_BASED_OUTPATIENT_CLINIC_OR_DEPARTMENT_OTHER): Payer: Medicare Other | Admitting: Pain Medicine

## 2016-09-05 ENCOUNTER — Ambulatory Visit
Admission: RE | Admit: 2016-09-05 | Discharge: 2016-09-05 | Disposition: A | Discharge status: Home / Self Care | Payer: Medicare Other | Source: Ambulatory Visit | Attending: Pain Medicine | Admitting: Pain Medicine

## 2016-09-05 VITALS — BP 140/86 | HR 75 | Temp 98.5°F | Resp 16 | Ht 68.0 in | Wt 179.0 lb

## 2016-09-05 DIAGNOSIS — M898X5 Other specified disorders of bone, thigh: Secondary | ICD-10-CM

## 2016-09-05 DIAGNOSIS — Z79899 Other long term (current) drug therapy: Secondary | ICD-10-CM | POA: Diagnosis not present

## 2016-09-05 DIAGNOSIS — M25522 Pain in left elbow: Secondary | ICD-10-CM | POA: Diagnosis not present

## 2016-09-05 DIAGNOSIS — G8929 Other chronic pain: Secondary | ICD-10-CM | POA: Diagnosis not present

## 2016-09-05 DIAGNOSIS — M25552 Pain in left hip: Secondary | ICD-10-CM | POA: Insufficient documentation

## 2016-09-05 DIAGNOSIS — D869 Sarcoidosis, unspecified: Secondary | ICD-10-CM | POA: Diagnosis not present

## 2016-09-05 MED ORDER — ROPIVACAINE HCL 2 MG/ML IJ SOLN
4.0000 mL | Freq: Once | INTRAMUSCULAR | Status: AC
  Administered 2016-09-05: 4 mL
  Filled 2016-09-05: qty 10

## 2016-09-05 MED ORDER — METHYLPREDNISOLONE ACETATE 80 MG/ML IJ SUSP
80.0000 mg | Freq: Once | INTRAMUSCULAR | Status: AC
  Administered 2016-09-05: 80 mg
  Filled 2016-09-05: qty 1

## 2016-09-05 MED ORDER — FENTANYL CITRATE (PF) 100 MCG/2ML IJ SOLN
25.0000 ug | INTRAMUSCULAR | Status: DC | PRN
  Filled 2016-09-05: qty 2

## 2016-09-05 MED ORDER — MIDAZOLAM HCL 5 MG/5ML IJ SOLN
1.0000 mg | INTRAMUSCULAR | Status: DC | PRN
  Filled 2016-09-05: qty 5

## 2016-09-05 MED ORDER — LACTATED RINGERS IV SOLN
1000.0000 mL | Freq: Once | INTRAVENOUS | Status: AC
  Administered 2016-09-05: 1000 mL via INTRAVENOUS

## 2016-09-05 MED ORDER — LIDOCAINE HCL (PF) 1 % IJ SOLN
10.0000 mL | Freq: Once | INTRAMUSCULAR | Status: AC
  Administered 2016-09-05: 10 mL
  Filled 2016-09-05: qty 10

## 2016-09-05 MED ORDER — IOPAMIDOL (ISOVUE-M 200) INJECTION 41%
10.0000 mL | Freq: Once | INTRAMUSCULAR | Status: AC
  Administered 2016-09-05: 10 mL via INTRA_ARTICULAR
  Filled 2016-09-05: qty 10

## 2016-09-05 NOTE — Patient Instructions (Signed)

## 2016-09-05 NOTE — Progress Notes (Signed)
Safety precautions to be maintained throughout the outpatient stay will include: orient to surroundings, keep bed in low position, maintain call bell within reach at all times, provide assistance with transfer out of bed and ambulation.  

## 2016-09-05 NOTE — Progress Notes (Signed)
Patient's Name: Douglas Andrews  MRN: [id number]  Referring Provider: Delano Metz, MD  DOB: 02-25-1951  PCP: Katharine Look, MD  DOS: 09/05/2016  Note by: Sydnee Levans. Laban Emperor, MD  Service setting: Ambulatory outpatient  Location: [facility] (AMB) Pain Management Facility  Visit type: Procedure  Specialty: Interventional Pain Management  Patient type: Established   Primary Reason for Visit: Interventional Pain Management Treatment. CC: Hip Pain (left)  Procedure:  Anesthesia, Analgesia, Anxiolysis:  Type: Diagnostic Intra-Articular Hip Injection Region:  Posterolateral hip joint area. Level: Lower pelvic and hip joint level. Laterality: Left-Sided  Type: Local Anesthesia with Moderate (Conscious) Sedation Local Anesthetic: Lidocaine 1% Route: Intravenous (IV) IV Access: Secured Sedation: Meaningful verbal contact was maintained at all times during the procedure  Indication(s): Analgesia and Anxiety  Indications: 1. Chronic hip pain, left   2. 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 (year old), male patient, seen today for interventional treatment. He  has a past surgical history that includes Appendectomy; Tonsillectomy; and left rod.. His primarily concern today is the Hip Pain (left) The primary encounter diagnosis was Chronic hip pain, left. 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  Pain Type: Chronic pain Pain Location: Hip Pain Orientation: Left Pain Descriptors / Indicators: Aching, Constant, Throbbing Pain Frequency: Constant  Date of Last Visit: 08/22/16 Service Provided on Last Visit: Evaluation (no procedure done ; had x ray done of left hip and knee)  Coagulation Parameters No results found for: INR, LABPROT, APTT, PLT Verification of the correct person, correct site  (including marking of site), and correct procedure were performed and confirmed by the patient.  Consent: Before the procedure and under the influence of no sedative(s), amnesic(s), or anxiolytics, the patient was informed of the treatment options, risks and possible complications. To fulfill our ethical and legal obligations, as recommended by the American Medical Association's Code of Ethics, I have informed the patient of my clinical impression; the nature and purpose of the treatment or procedure; the risks, benefits, and possible complications of the intervention; the alternatives, including doing nothing; the risk(s) and benefit(s) of the alternative treatment(s) or procedure(s); and the risk(s) and benefit(s) of doing nothing. The patient was provided information about the general risks and possible complications associated with the procedure. These may include, but are not limited to: failure to achieve desired goals, infection, bleeding, organ or nerve damage, allergic reactions, paralysis, and death. In addition, the patient was informed of those risks and complications associated to the procedure, such as failure to decrease pain; infection; bleeding; organ or nerve damage with subsequent damage to sensory, motor, and/or autonomic systems, resulting in permanent pain, numbness, and/or weakness of one or several areas of the body; allergic reactions; (i.e.: anaphylactic reaction); and/or death. Furthermore, the patient was informed of those risks and complications associated with the medications. These include, but are not limited to: allergic reactions (i.e.: anaphylactic or anaphylactoid reaction(s)); adrenal axis suppression; blood sugar elevation that in diabetics may result in ketoacidosis or comma; water retention that in patients with history of congestive heart failure may result in shortness of breath, pulmonary edema, and decompensation with resultant heart failure; weight gain; swelling or  edema; medication-induced neural toxicity; particulate matter embolism and blood vessel occlusion with resultant organ, and/or nervous system infarction; and/or aseptic necrosis of  one or more joints. Finally, the patient was informed that Medicine is not an exact science; therefore, there is also the possibility of unforeseen or unpredictable risks and/or possible complications that may result in a catastrophic outcome. The patient indicated having understood very clearly. We have given the patient no guarantees and we have made no promises. Enough time was given to the patient to ask questions, all of which were answered to the patient's satisfaction. Douglas Andrews has indicated that he wanted to continue with the procedure.  Consent Attestation: I, the ordering provider, attest that I have discussed with the patient the benefits, risks, side-effects, alternatives, likelihood of achieving goals, and potential problems during recovery for the procedure that I have provided informed consent.  Pre-Procedure Preparation:  Safety Precautions: Allergies reviewed. The patient was asked about blood thinners, or active infections, both of which were denied. The patient was asked to confirm the procedure and laterality, before marking the site, and again before commencing the procedure. Appropriate site, procedure, and patient were confirmed by following the Joint Commission's Universal Protocol (UP.01.01.01), in the form of a "Time Out". The patient was asked to participate by confirming the accuracy of the "Time Out" information. Patient was assessed for positional comfort and pressure points before starting the procedure. Allergies: He has No Known Allergies. Allergy Precautions: None required Infection Control Precautions: Sterile technique used. Standard Universal Precautions were taken as recommended by the Department of [facility] for Disease Control and Prevention (CDC). Standard pre-surgical skin  prep was conducted. Respiratory hygiene and cough etiquette was practiced. Hand hygiene observed. Safe injection practices and needle disposal techniques followed. SDV (single dose vial) medications used. Medications properly checked for expiration dates and contaminants. Personal protective equipment (PPE) used as per protocol. Monitoring:  As per clinic protocol. Vitals:   09/05/16 1031 09/05/16 1042 09/05/16 1050 09/05/16 1100  BP: 136/81 (!) 159/88 140/86 140/86  Pulse: 77 73 75 75  Resp: 16 16 15 16   Temp:  98.4 F (36.9 C)  98.5 F (36.9 C)  TempSrc:  Temporal    SpO2: 95% 98% 97% 97%  Weight:      Height:      Calculated BMI: Body mass index is 27.22 kg/m. Time-out: "Time-out" completed before starting procedure, as per protocol.  Description of Procedure Process:   Time-out: "Time-out" completed before starting procedure, as per protocol. Position: Prone Target Area: Superior aspect of the hip joint cavity, going thru the superior portion of the capsular ligament. Approach: Posterolateral approach. Area Prepped: Entire Posterolateral hip area. Prepping solution: ChloraPrep (2% chlorhexidine gluconate and 70% isopropyl alcohol) Safety Precautions: Aspiration looking for blood return was conducted prior to all injections. At no point did we inject any substances, as a needle was being advanced. No attempts were made at seeking any paresthesias. Safe injection practices and needle disposal techniques used. Medications properly checked for expiration dates. SDV (single dose vial) medications used. Description of the Procedure: Protocol guidelines were followed. The patient was placed in position over the fluoroscopy table. The target area was identified and the area prepped in the usual manner. Skin & deeper tissues infiltrated with local anesthetic. Appropriate amount of time allowed to pass for local anesthetics to take effect. The procedure needles were then advanced to the target  area. Proper needle placement secured. Negative aspiration confirmed. Solution injected in intermittent fashion, asking for systemic symptoms every 0.5cc of injectate. The needles were then removed and the area cleansed, making sure to leave some of  the prepping solution back to take advantage of its long term bactericidal properties. EBL: Minimal Materials & Medications Used:  Needle(s) Used: 22g - 7" Spinal Needle(s) Solution Injected: 0.2% PF-Ropivacaine (4ml) + SDV-DepoMedrol 80 mg/ml (1ml) Medications Administered today: We administered lactated ringers, methylPREDNISolone acetate, lidocaine (PF), ropivacaine (PF) 2 mg/ml (0.2%), and iopamidol.Please see chart orders for dosing details.  Imaging Guidance (Non-Spinal):  Type of Imaging Technique: Fluoroscopy Guidance (Non-Spinal) Indication(s): Assistance in needle guidance and placement for procedures requiring needle placement in or near specific anatomical locations not easily accessible without such assistance. Exposure Time: Please see nurses notes. Contrast: Before injecting any contrast, we confirmed that the patient did not have an allergy to iodine, shellfish, or radiological contrast. Once satisfactory needle placement was completed at the desired level, radiological contrast was injected. Contrast injected under live fluoroscopy. No contrast complications. See chart for type and volume of contrast used. Fluoroscopic Guidance: I was personally present during the use of fluoroscopy. "Tunnel Vision Technique" used to obtain the best possible view of the target area. Parallax error corrected before commencing the procedure. "Direction-depth-direction" technique used to introduce the needle under continuous pulsed fluoroscopy. Once target was reached, antero-posterior, oblique, and lateral fluoroscopic projection used confirm needle placement in all planes. Images permanently stored in EMR. Interpretation: I personally interpreted the imaging  intraoperatively. Adequate needle placement confirmed in multiple planes. Appropriate spread of contrast into desired area was observed. No evidence of afferent or efferent intravascular uptake. Permanent images saved into the patient's record.  Antibiotic Prophylaxis:  Indication(s): No indications identified. Type:  Antibiotics Given (last 72 hours)    None      Post-operative Assessment:  Complications: No immediate post-treatment complications observed by team, or reported by patient. Disposition: The patient tolerated the entire procedure well. A repeat set of vitals were taken after the procedure and the patient was kept under observation following institutional policy, for this type of procedure. Post-procedural neurological assessment was performed, showing return to baseline, prior to discharge. The patient was provided with post-procedure discharge instructions, including a section on how to identify potential problems. Should any problems arise concerning this procedure, the patient was given instructions to immediately contact us, at any time, without hesitation. In any case, we plan to contact the patient by telephone for a follow-up status report regarding this interventional procedure. Comments:  No additional relevant information.  Plan of Care  Discharge to: Discharge home  Medications ordered for procedure: Meds ordered this encounter  Medications  . fentaNYL (SUBLIMAZE) injection 25-50 mcg    Make sure Narcan is available in the pyxis when using this medication. In the event of respiratory depression (RR< 8/min): Titrate NARCAN (naloxone) in increments of 0.1 to 0.2 mg IV at 2-3 minute intervals, until desired degree of reversal.  . lactated ringers infusion 1,000 mL  . midazolam (VERSED) 5 MG/5ML injection 1-2 mg    Make sure Flumazenil is available in the pyxis when using this medication. If oversedation occurs, administer 0.2 mg IV over 15 sec. If after 45 sec no  response, administer 0.2 mg again over 1 min; may repeat at 1 min intervals; not to exceed 4 doses (1 mg)  . methylPREDNISolone acetate (DEPO-MEDROL) injection 80 mg  . lidocaine (PF) (XYLOCAINE) 1 % injection 10 mL  . ropivacaine (PF) 2 mg/ml (0.2%) (NAROPIN) epidural 4 mL  . iopamidol (ISOVUE-M) 41 % intrathecal injection 10 mL   Medications administered: (For more details, see medical record) We administered lactated ringers, methylPREDNISolone acetate,  lidocaine (PF), ropivacaine (PF) 2 mg/ml (0.2%), and iopamidol. Lab-work, Procedure(s), & Referral(s) Ordered: Orders Placed This Encounter  Procedures  . DG C-Arm 1-60 Min-No Report   Imaging Ordered: Results for orders placed in visit on 07/05/16  DG C-Arm 1-60 Min-No Report   Narrative CLINICAL DATA: Sub-acute chronic pain syndrome   C-ARM 1-60 MINUTES  Fluoroscopy was utilized by the requesting physician.  No radiographic  interpretation.     New Prescriptions   No medications on file   Physician-requested Follow-up:  Return in about 2 weeks (around 09/19/2016) for Post-Procedure evaluation.  Future Appointments Date Time Provider Department Center  09/22/2016 11:15 AM Delano MetzFrancisco Riot Waterworth, MD [facility] None   Primary Care Physician: Katharine LookStephanie J Foley, MD [facility] Note by: Sydnee LevansFrancisco A. Laban EmperorNaveira, M.D, DABA, DABAPM, DABPM, DABIPP, FIPP  Disclaimer:  Medicine is not an exact science. The only guarantee in medicine is that nothing is guaranteed. It is important to note that the decision to proceed with this intervention was based on the information collected from the patient. The Data and conclusions were drawn from the patient's questionnaire, the interview, and the physical examination. Because the information was provided in large part by the patient, it cannot be guaranteed that it has not been purposely or unconsciously manipulated. Every effort has been made to obtain as much  relevant data as possible for this evaluation. It is important to note that the conclusions that lead to this procedure are derived in large part from the available data. Always take into account that the treatment will also be dependent on availability of resources and existing treatment guidelines, considered by other Pain Management Practitioners as being common knowledge and practice, at the time of the intervention. For Medico-Legal purposes, it is also important to point out that variation in procedural techniques and pharmacological choices are the acceptable norm. The indications, contraindications, technique, and results of the above procedure should only be interpreted and judged by a Board-Certified Interventional Pain Specialist with extensive familiarity and expertise in the same exact procedure and technique. Attempts at providing opinions without similar or greater experience and expertise than that of the treating physician will be considered as inappropriate and unethical, and shall result in a formal complaint to the state medical board and applicable specialty societies.  Instructions provided at this appointment: Patient Instructions  Pain Management Discharge Instructions  General Discharge Instructions :  If you need to reach your doctor call: Monday-Friday 8:00 am - 4:00 pm at [phone] or toll free [phone].  After clinic hours [phone] to have operator reach doctor.  Bring all of your medication bottles to all your appointments in the pain clinic.  To cancel or reschedule your appointment with Pain Management please remember to call 24 hours in advance to avoid a fee.  Refer to the educational materials which you have been given on: General Risks, I had my Procedure. Discharge Instructions, Post Sedation.  Post Procedure Instructions:  The drugs you were given will stay in your system until tomorrow, so for the next 24 hours you should not drive, make any legal  decisions or drink any alcoholic beverages.  You may eat anything you prefer, but it is better to start with liquids then soups and crackers, and gradually work up to solid foods.  Please notify your doctor immediately if you have any unusual bleeding, trouble breathing or pain that is not related to your normal pain.  Depending on the type of procedure that was done,  some parts of your body may feel week and/or numb.  This usually clears up by tonight or the next day.  Walk with the use of an assistive device or accompanied by an adult for the 24 hours.  You may use ice on the affected area for the first 24 hours.  Put ice in a Ziploc bag and cover with a towel and place against area 15 minutes on 15 minutes off.  You may switch to heat after 24 hours.

## 2016-09-06 ENCOUNTER — Telehealth: Payer: Self-pay | Admitting: *Deleted

## 2016-09-06 NOTE — Progress Notes (Signed)
Results were reviewed and found to be: mildly abnormal  No acute injury or pathology identified  Review would suggest interventional pain management techniques may be of benefit 

## 2016-09-06 NOTE — Telephone Encounter (Signed)
Unable to reach patient with listed phone number. Attempted twice.

## 2016-09-19 ENCOUNTER — Encounter: Payer: Medicare Other | Admitting: Pain Medicine

## 2016-09-22 ENCOUNTER — Encounter: Payer: Self-pay | Admitting: Pain Medicine

## 2016-09-22 ENCOUNTER — Ambulatory Visit: Payer: Medicare Other | Attending: Pain Medicine | Admitting: Pain Medicine

## 2016-09-22 VITALS — BP 139/96 | HR 84 | Temp 97.9°F | Resp 16 | Ht 68.0 in | Wt 180.0 lb

## 2016-09-22 DIAGNOSIS — D869 Sarcoidosis, unspecified: Secondary | ICD-10-CM | POA: Insufficient documentation

## 2016-09-22 DIAGNOSIS — E559 Vitamin D deficiency, unspecified: Secondary | ICD-10-CM | POA: Insufficient documentation

## 2016-09-22 DIAGNOSIS — K5900 Constipation, unspecified: Secondary | ICD-10-CM | POA: Insufficient documentation

## 2016-09-22 DIAGNOSIS — M25552 Pain in left hip: Secondary | ICD-10-CM | POA: Insufficient documentation

## 2016-09-22 DIAGNOSIS — G510 Bell's palsy: Secondary | ICD-10-CM | POA: Insufficient documentation

## 2016-09-22 DIAGNOSIS — Z7952 Long term (current) use of systemic steroids: Secondary | ICD-10-CM | POA: Diagnosis not present

## 2016-09-22 DIAGNOSIS — Z9889 Other specified postprocedural states: Secondary | ICD-10-CM | POA: Diagnosis not present

## 2016-09-22 DIAGNOSIS — I10 Essential (primary) hypertension: Secondary | ICD-10-CM | POA: Diagnosis not present

## 2016-09-22 DIAGNOSIS — F419 Anxiety disorder, unspecified: Secondary | ICD-10-CM | POA: Insufficient documentation

## 2016-09-22 DIAGNOSIS — Z79899 Other long term (current) drug therapy: Secondary | ICD-10-CM | POA: Insufficient documentation

## 2016-09-22 DIAGNOSIS — G8929 Other chronic pain: Secondary | ICD-10-CM

## 2016-09-22 DIAGNOSIS — F1190 Opioid use, unspecified, uncomplicated: Secondary | ICD-10-CM

## 2016-09-22 DIAGNOSIS — E119 Type 2 diabetes mellitus without complications: Secondary | ICD-10-CM | POA: Insufficient documentation

## 2016-09-22 DIAGNOSIS — E785 Hyperlipidemia, unspecified: Secondary | ICD-10-CM | POA: Insufficient documentation

## 2016-09-22 DIAGNOSIS — M549 Dorsalgia, unspecified: Secondary | ICD-10-CM | POA: Insufficient documentation

## 2016-09-22 DIAGNOSIS — M545 Low back pain, unspecified: Secondary | ICD-10-CM

## 2016-09-22 DIAGNOSIS — Z79891 Long term (current) use of opiate analgesic: Secondary | ICD-10-CM

## 2016-09-22 DIAGNOSIS — M79604 Pain in right leg: Secondary | ICD-10-CM | POA: Diagnosis not present

## 2016-09-22 DIAGNOSIS — G894 Chronic pain syndrome: Secondary | ICD-10-CM

## 2016-09-22 DIAGNOSIS — Z794 Long term (current) use of insulin: Secondary | ICD-10-CM | POA: Diagnosis not present

## 2016-09-22 DIAGNOSIS — M5441 Lumbago with sciatica, right side: Secondary | ICD-10-CM | POA: Diagnosis not present

## 2016-09-22 DIAGNOSIS — M792 Neuralgia and neuritis, unspecified: Secondary | ICD-10-CM

## 2016-09-22 DIAGNOSIS — Z7982 Long term (current) use of aspirin: Secondary | ICD-10-CM | POA: Diagnosis not present

## 2016-09-22 DIAGNOSIS — M5116 Intervertebral disc disorders with radiculopathy, lumbar region: Secondary | ICD-10-CM | POA: Insufficient documentation

## 2016-09-22 DIAGNOSIS — F329 Major depressive disorder, single episode, unspecified: Secondary | ICD-10-CM | POA: Diagnosis not present

## 2016-09-22 MED ORDER — OXYCODONE-ACETAMINOPHEN 10-325 MG PO TABS: 1.0000 | ORAL_TABLET | Freq: Four times a day (QID) | ORAL | 0 refills | Status: DC | PRN

## 2016-09-22 MED ORDER — GABAPENTIN 600 MG PO TABS: 600.0000 mg | ORAL_TABLET | Freq: Three times a day (TID) | ORAL | 0 refills | Status: DC

## 2016-09-22 NOTE — Progress Notes (Signed)
Patient's Name: Douglas Andrews  MRN: [id number]  Referring Provider: Fritzi Mandes, MD  DOB: Dec 26, 1950  PCP: Fritzi Mandes, MD  DOS: 09/22/2016  Note by: Kathlen Brunswick. Dossie Arbour, MD  Service setting: Ambulatory outpatient  Specialty: Interventional Pain Management  Location: [facility] (AMB) Pain Management Facility    Patient type: Established   Primary Reason(s) for Visit: Encounter for prescription drug management & post-procedure evaluation of chronic illness with mild to moderate exacerbation(Level of risk: moderate) CC: Back Pain (lower, center)  HPI  Douglas Andrews is a 65 y.o. year old, male patient, who comes today for a post-procedure evaluation and medication management. He has Chronic low back pain (Location of Primary Source of Pain) (Bilateral) (L>R); Chronic pain syndrome;  Lumbar DDD (degenerative disc disease);  Lumbar Spondylosis; Lumbar radicular pain (Right) (L5 Dermatome); Lumbar facet syndrome (Location of Primary Source of Pain) (Bilateral) (R>L); Opiate use (60 MME/Day); Uncomplicated opioid dependence ([name]); Long term prescription opiate use; Chronic knee pain (Location of Tertiary source of pain) (Bilateral) (Right); Vitamin D insufficiency; Overweight; Bell palsy; CN (constipation); Dermatitis, eczematoid; Diabetes mellitus ([place]); Dysmetria; Essential (primary) hypertension; Anxiety, generalized; Insomnia due to medical condition; Long term current use of systemic steroids; Type 2 diabetes mellitus ([place]); Pulmonary sarcoidosis ([place]); Arthropathy, traumatic, shoulder; Bursitis, trochanteric; Esophageal varices ([place]); Leg varices; Hepatic cirrhosis ([place]); Major depressive disorder with single episode; Other long term (current) drug therapy; At risk for falling; Encounter for chronic pain management; Encounter for therapeutic drug level monitoring; Chronic lower extremity pain (Location of Secondary source of pain) (Right); Chronic lumbar radicular pain (Right) (L5 Dermatome); Lumbar  Levoscoliosis; Neurogenic pain; Osteoarthritis of knee (Right); At high risk for falls; Chest pain; Skin lesion; Pain of left femur (left femoral rod due to sarcoidosis); Chronic hip pain (Right); Osteoarthritis of knee (Bilateral) (R>L); Essential hypertension; Folliculitis; Chronic left sacroiliac joint pain; Chronic hip pain, left; Chronic left-sided thoracic back pain; and Chronic midline low back pain without sciatica on his problem list. His primarily concern today is the Back Pain (lower, center)  Pain Assessment: Self-Reported Pain Score: 4 /10             Reported level is compatible with observation.       Pain Type: Chronic pain Pain Location: Back Pain Orientation: Lower Pain Descriptors / Indicators: Throbbing Pain Frequency: Constant  Douglas Andrews was last seen on 09/05/2016 for a procedure. During today's appointment we reviewed Douglas Andrews post-procedure results, as well as his outpatient medication regimen. The patient indicates that other than a constant throbbing pain in the middle of his lower back, all of the other pains that he had are now gone.  Further details on both, my assessment(s), as well as the proposed treatment plan, please see below.  Controlled Substance Pharmacotherapy Assessment REMS (Risk Evaluation and Mitigation Strategy)  Analgesic:Oxycodone/APAP 10/325 one every 6 hours (40 mg/day) MME/day:60 mg/day Douglas Martins, RN  09/22/2016 11:17 AM  Sign at close encounter Nursing Pain Medication Assessment:  Safety precautions to be maintained throughout the outpatient stay will include: orient to surroundings, keep bed in low position, maintain call bell within reach at all times, provide assistance with transfer out of bed and ambulation.  Medication Inspection Compliance: Pill count conducted under aseptic conditions, in front of the patient. Neither the pills nor the bottle was removed from the patient's sight at any time. Once count was completed pills  were immediately returned to the patient in their original bottle.  Medication: Oxycodone/APAP Pill Count: 29 of  120 pills remain Bottle Appearance: Standard pharmacy container. Clearly labeled. Filled Date: 41 / 21 2017 Medication last intake:09-22-16 at 0730 Bag of meds fell out of his vehicle this morning and he ran over them, crushing many of the pills. Counted the pills in the bubble card and ones loose in the bag, counted 41 or 42 pills   Pharmacokinetics: Liberation and absorption (onset of action): WNL Distribution (time to peak effect): WNL Metabolism and excretion (duration of action): WNL         Pharmacodynamics: Desired effects: Analgesia: Douglas Andrews reports >50% benefit. Functional ability: Patient reports that medication allows him to accomplish basic ADLs Clinically meaningful improvement in function (CMIF): Sustained CMIF goals met Perceived effectiveness: Described as relatively effective, allowing for increase in activities of daily living (ADL) Undesirable effects: Side-effects or Adverse reactions: None reported Monitoring: [place] PMP: Online review of the past 44-monthperiod conducted. Compliant with practice rules and regulations List of all UDS test(s) done:  Lab Results  Component Value Date   TOXASSSELUR FINAL 03/30/2016   TOXASSSELUR FINAL 11/19/2015   TOXASSSELUR FINAL 10/07/2015   Last UDS on record: ToxAssure Select 13  Date Value Ref Range Status  03/30/2016 FINAL  Final    Comment:    ==================================================================== TOXASSURE SELECT 13 (MW) ==================================================================== Test                             Result       Flag       Units Drug Present and Declared for Prescription Verification   Oxycodone                      578          EXPECTED   ng/mg creat   Oxymorphone                    360          EXPECTED   ng/mg creat   Noroxycodone                   2551          EXPECTED   ng/mg creat   Noroxymorphone                 135          EXPECTED   ng/mg creat    Sources of oxycodone are scheduled prescription medications.    Oxymorphone, noroxycodone, and noroxymorphone are expected    metabolites of oxycodone. Oxymorphone is also available as a    scheduled prescription medication. Drug Present not Declared for Prescription Verification   7-aminoclonazepam              100          UNEXPECTED ng/mg creat    7-aminoclonazepam is an expected metabolite of clonazepam. Source    of clonazepam is a scheduled prescription medication. ==================================================================== Test                      Result    Flag   Units      Ref Range   Creatinine              83               mg/dL      >=20 ==================================================================== Declared Medications:  The flagging and interpretation on this report  are based on the  following declared medications.  Unexpected results may arise from  inaccuracies in the declared medications.  **Note: The testing scope of this panel includes these medications:  Oxycodone (Oxycodone Acetaminophen)  **Note: The testing scope of this panel does not include following  reported medications:  Acetaminophen (Oxycodone Acetaminophen)  Albuterol  Aspirin  Atorvastatin  Cholecalciferol  Diphenhydramine  Gabapentin  Hydroxyzine  Ibuprofen  Insulin (Humalog)  Insulin (Lantus)  Isosorbide Mononitrate  Lisinopril  Metformin  Nitroglycerin  Omeprazole  Prednisone  Sertraline ==================================================================== For clinical consultation, please call [phone]. ====================================================================    UDS interpretation: Compliant          Medication Assessment Form: Reviewed. Patient indicates being compliant with therapy Treatment compliance: Compliant Risk Assessment Profile: Aberrant  behavior: See prior evaluations. None observed or detected today Comorbid factors increasing risk of overdose: See prior notes. No additional risks detected today Risk of substance use disorder (SUD): Low Opioid Risk Tool (ORT) Total Score:    Interpretation Table:  Score <3 = Low Risk for SUD  Score between 4-7 = Moderate Risk for SUD  Score >8 = High Risk for Opioid Abuse   Risk Mitigation Strategies:  Patient Counseling: Covered Patient-Prescriber Agreement (PPA): Present and active  Notification to other healthcare providers: Done  Pharmacologic Plan: No change in therapy, at this time  Post-Procedure Assessment  09/05/2016 Procedure: Diagnostic left intra-articular hip joint injection, under fluoroscopic guidance and IV sedation Post-procedure pain score: 0/10 (100% relief) Influential Factors: BMI: 27.37 kg/m Intra-procedural challenges: None observed Assessment challenges: None detected         Post-procedural side-effects, adverse reactions, or complications: None reported Reported issues: None  Sedation: Sedation provided. When no sedatives are used, the analgesic levels obtained are directly associated to the effectiveness of the local anesthetics. However, when sedation is provided, the level of analgesia obtained during the initial 1 hour following the intervention, is believed to be the result of a combination of factors. These factors may include, but are not limited to: 1. The effectiveness of the local anesthetics used. 2. The effects of the analgesic(s) and/or anxiolytic(s) used. 3. The degree of discomfort experienced by the patient at the time of the procedure. 4. The patients ability and reliability in recalling and recording the events. 5. The presence and influence of possible secondary gains and/or psychosocial factors. Reported result: Relief experienced during the 1st hour after the procedure: 100 % (Ultra-Short Term Relief) Interpretative annotation:  Analgesia during this period is likely to be Local Anesthetic and/or IV Sedative (Analgesic/Anxiolitic) related.          Effects of local anesthetic: The analgesic effects attained during this period are directly associated to the localized infiltration of local anesthetics and therefore cary significant diagnostic value as to the etiological location, or anatomical origin, of the pain. Expected duration of relief is directly dependent on the pharmacodynamics of the local anesthetic used. Long-acting (4-6 hours) anesthetics used.  Reported result: Relief during the next 4 to 6 hour after the procedure: 100 % (Short-Term Relief) Interpretative annotation: Complete relief would suggest area to be the source of the pain.          Long-term benefit: Defined as the period of time past the expected duration of local anesthetics. With the possible exception of prolonged sympathetic blockade from the local anesthetics, benefits during this period are typically attributed to, or associated with, other factors such as analgesic sensory neuropraxia, antiinflammatory effects, or beneficial biochemical changes provided by  agents other than the local anesthetics Reported result: Extended relief following procedure: 100 % (Long-Term Relief) Interpretative annotation: Good relief. This could suggest inflammation to be a significant component in the etiology to the pain.          Current benefits: Defined as persistent relief that continues at this point in time.   Reported results: Treated area: 100 % In addition, the patient reports improvement in function Interpretative annotation: Ongoing benefits would suggest effective therapeutic approach  Interpretation: Results would suggest a successful therapeutic intervention. This also would indicate that the pain in the left hip area was coming from the hip joint itself and not being referred from the lower back. This also would suggest that the pain was intra-articular  and not necessarily coming from the trochanteric bursa.  Laboratory Chemistry  Inflammation Markers Lab Results  Component Value Date   ESRSEDRATE 1 03/30/2016   CRP <0.5 03/30/2016   Renal Function Lab Results  Component Value Date   BUN 12 03/30/2016   CREATININE 0.85 03/30/2016   GFRAA >60 03/30/2016   GFRNONAA >60 03/30/2016   Hepatic Function Lab Results  Component Value Date   AST 24 03/30/2016   ALT 27 03/30/2016   ALBUMIN 4.6 03/30/2016   Electrolytes Lab Results  Component Value Date   NA 136 03/30/2016   K 3.8 03/30/2016   CL 100 (L) 03/30/2016   CALCIUM 9.2 03/30/2016   MG 2.2 03/30/2016   Pain Modulating Vitamins Lab Results  Component Value Date   25OHVITD1 38 03/30/2016   25OHVITD2 3.4 03/30/2016   25OHVITD3 35 03/30/2016   VITAMINB12 539 03/30/2016   Coagulation Parameters No results found for: INR, LABPROT, APTT, PLT Cardiovascular No results found for: BNP, HGB, HCT Note: Lab results reviewed.  Recent Diagnostic Imaging Review  Dg C-arm 1-60 Min-no Report  Result Date: 09/08/2016 Fluoroscopy was utilized by the requesting physician.  No radiographic interpretation.   Note: Imaging results reviewed.          Meds  The patient has a current medication list which includes the following prescription(s): albuterol, albuterol, aspirin ec, atorvastatin, cetirizine, vitamin d3, gabapentin, glucose blood, hydroxyzine, ibuprofen-diphenhydramine cit, insulin glargine, insulin lispro, insulin pen needle, isosorbide mononitrate, lantus solostar, lisinopril, metformin, nitroglycerin, omeprazole, oxycodone-acetaminophen, oxycodone-acetaminophen, oxycodone-acetaminophen, and sertraline.  Current Outpatient Prescriptions on File Prior to Visit  Medication Sig  . albuterol (PROAIR HFA) 108 (90 Base) MCG/ACT inhaler INHALE 2 PUFFS BY MOUTH EVERY 4 HOURS ASNEEDED FOR WHEEZING  . albuterol (PROVENTIL) (5 MG/ML) 0.5% nebulizer solution Take 2.5 mg by  nebulization every 6 (six) hours as needed for wheezing or shortness of breath.   Marland Kitchen aspirin EC 81 MG tablet Take 81 mg by mouth daily.  Marland Kitchen atorvastatin (LIPITOR) 40 MG tablet Take 40 mg by mouth daily.  . cetirizine (ZYRTEC) 10 MG tablet Take 10 mg by mouth daily.   . Cholecalciferol (VITAMIN D3) 5000 units TABS Take 1 tablet by mouth daily.   Marland Kitchen glucose blood (ACCU-CHEK ACTIVE STRIPS) test strip Checks 3 times daily  . hydrOXYzine (VISTARIL) 25 MG capsule Take 50 mg by mouth every 6 (six) hours as needed.   . Ibuprofen-Diphenhydramine Cit (ADVIL PM PO) Take 1 tablet by mouth at bedtime.  . insulin glargine (LANTUS) 100 UNIT/ML injection Inject 60 Units into the skin at bedtime.   . insulin lispro (HUMALOG) 100 UNIT/ML KiwkPen Inject 25 Units into the skin 2 (two) times daily. 20 units at supper time  . Insulin Pen Needle (UNIFINE  PENTIPS) 31G X 6 MM MISC   . isosorbide mononitrate (IMDUR) 30 MG 24 hr tablet 30 mg daily.   Marland Kitchen LANTUS SOLOSTAR 100 UNIT/ML Solostar Pen Inject 60 Units into the skin daily at 10 pm.   . lisinopril (PRINIVIL,ZESTRIL) 10 MG tablet Take 10 mg by mouth daily.   . metFORMIN (GLUCOPHAGE) 500 MG tablet Take 1,000 mg by mouth 2 (two) times daily with a meal.  . nitroGLYCERIN (NITROSTAT) 0.4 MG SL tablet Place 0.4 mg under the tongue every 5 (five) minutes as needed for chest pain.  Marland Kitchen omeprazole (PRILOSEC) 20 MG capsule Take 20 mg by mouth daily.  . sertraline (ZOLOFT) 50 MG tablet TAKE 1 TABLET BY MOUTH ONCE DAILY   No current facility-administered medications on file prior to visit.    ROS  Constitutional: Denies any fever or chills Gastrointestinal: No reported hemesis, hematochezia, vomiting, or acute GI distress Musculoskeletal: Denies any acute onset joint swelling, redness, loss of ROM, or weakness Neurological: No reported episodes of acute onset apraxia, aphasia, dysarthria, agnosia, amnesia, paralysis, loss of coordination, or loss of consciousness  Allergies   Douglas Andrews has No Known Allergies.  [place]  Drug: Douglas Andrews  reports that he does not use drugs. Alcohol:  reports that he does not drink alcohol. Tobacco:  reports that he has never smoked. He has never used smokeless tobacco. Medical:  has a past medical history of Chronic back pain; Diabetes mellitus without complication ([place]); Hyperlipidemia; Hypertension; Left leg pain (01/02/2014); Right leg pain (07/15/2015); and Sarcoidosis ([place]). Family: family history includes Diabetes in his father; Heart disease in his father and mother; Stroke in his father.  Past Surgical History:  Procedure Laterality Date  . APPENDECTOMY    . left rod    . TONSILLECTOMY     Constitutional Exam  General appearance: Well nourished, well developed, and well hydrated. In no apparent acute distress Vitals:   09/22/16 1102  BP: (!) 139/96  Pulse: 84  Resp: 16  Temp: 97.9 F (36.6 C)  TempSrc: Oral  SpO2: 100%  Weight: 180 lb (81.6 kg)  Height: _0  (1.727 m)   BMI Assessment: Estimated body mass index is 27.37 kg/m as calculated from the following:   Height as of this encounter: _1  (1.727 m).   Weight as of this encounter: 180 lb (81.6 kg).  BMI interpretation table: BMI level Category Range association with higher incidence of chronic pain  <18 kg/m2 Underweight   18.5-24.9 kg/m2 Ideal body weight   25-29.9 kg/m2 Overweight Increased incidence by 20%  30-34.9 kg/m2 Obese (Class I) Increased incidence by 68%  35-39.9 kg/m2 Severe obesity (Class II) Increased incidence by 136%  >40 kg/m2 Extreme obesity (Class III) Increased incidence by 254%   BMI Readings from Last 4 Encounters:  09/22/16 27.37 kg/m  09/05/16 27.22 kg/m  08/22/16 27.12 kg/m  08/10/16 27.87 kg/m   Wt Readings from Last 4 Encounters:  09/22/16 180 lb (81.6 kg)  09/05/16 179 lb (81.2 kg)  08/22/16 181 lb (82.1 kg)  08/10/16 186 lb (84.4 kg)  Psych/Mental status: Alert, oriented x 3 (person, place, & time) Eyes:  PERLA Respiratory: No evidence of acute respiratory distress  Cervical Spine Exam  Inspection: No masses, redness, or swelling Alignment: Symmetrical Functional ROM: Unrestricted ROM Stability: No instability detected Muscle strength & Tone: Functionally intact Sensory: Unimpaired Palpation: Non-contributory  Upper Extremity (UE) Exam    Side: Right upper extremity  Side: Left upper extremity  Inspection: No masses, redness,  swelling, or asymmetry  Inspection: No masses, redness, swelling, or asymmetry  Functional ROM: Unrestricted ROM          Functional ROM: Unrestricted ROM          Muscle strength & Tone: Functionally intact  Muscle strength & Tone: Functionally intact  Sensory: Unimpaired  Sensory: Unimpaired  Palpation: Non-contributory  Palpation: Non-contributory   Thoracic Spine Exam  Inspection: No masses, redness, or swelling Alignment: Symmetrical Functional ROM: Unrestricted ROM Stability: No instability detected Sensory: Unimpaired Muscle strength & Tone: Functionally intact Palpation: Non-contributory  Lumbar Spine Exam  Inspection: No masses, redness, or swelling Alignment: Symmetrical Functional ROM: Unrestricted ROM Stability: No instability detected Muscle strength & Tone: Functionally intact Sensory: Unimpaired Palpation: Non-contributory Provocative Tests: Lumbar Hyperextension and rotation test: evaluation deferred today       Patrick's Maneuver: evaluation deferred today              Gait & Posture Assessment  Ambulation: Unassisted Gait: Relatively normal for age and body habitus Posture: WNL   Lower Extremity Exam    Side: Right lower extremity  Side: Left lower extremity  Inspection: No masses, redness, swelling, or asymmetry  Inspection: No masses, redness, swelling, or asymmetry  Functional ROM: Unrestricted ROM          Functional ROM: Unrestricted ROM          Muscle strength & Tone: Functionally intact  Muscle strength & Tone:  Functionally intact  Sensory: Unimpaired  Sensory: Unimpaired  Palpation: Non-contributory  Palpation: Non-contributory   Assessment  Primary Diagnosis & Pertinent Problem List: The primary encounter diagnosis was Chronic pain syndrome. Diagnoses of Chronic low back pain (Location of Primary Source of Pain) (Bilateral) (L>R), Chronic lower extremity pain (Location of Secondary source of pain) (Right), Long term current use of systemic steroids, Long term prescription opiate use, Opiate use (60 MME/Day), Neurogenic pain, and Chronic midline low back pain without sciatica were also pertinent to this visit.  Status Diagnosis   Stable  Stable  Stable 1. Chronic pain syndrome   2. Chronic low back pain (Location of Primary Source of Pain) (Bilateral) (L>R)   3. Chronic lower extremity pain (Location of Secondary source of pain) (Right)   4. Long term current use of systemic steroids   5. Long term prescription opiate use   6. Opiate use (60 MME/Day)   7. Neurogenic pain   8. Chronic midline low back pain without sciatica      Plan of Care  Pharmacotherapy (Medications Ordered): Meds ordered this encounter  Medications  . oxyCODONE-acetaminophen (PERCOCET) 10-325 MG tablet    Sig: Take 1 tablet by mouth every 6 (six) hours as needed for pain.    Dispense:  120 tablet    Refill:  0    Do not place this medication, or any other prescription from our practice, on "Automatic Refill". Patient may have prescription filled one day early if pharmacy is closed on scheduled refill date. Do not fill until: 10/27/16 To last until: 11/26/16  . oxyCODONE-acetaminophen (PERCOCET) 10-325 MG tablet    Sig: Take 1 tablet by mouth every 6 (six) hours as needed for pain.    Dispense:  120 tablet    Refill:  0    Do not place this medication, or any other prescription from our practice, on "Automatic Refill". Patient may have prescription filled one day early if pharmacy is closed on scheduled refill  date. Do not fill until: 11/26/16 To last until: 12/26/16  .  oxyCODONE-acetaminophen (PERCOCET) 10-325 MG tablet    Sig: Take 1 tablet by mouth every 6 (six) hours as needed for pain.    Dispense:  120 tablet    Refill:  0    Do not place this medication, or any other prescription from our practice, on "Automatic Refill". Patient may have prescription filled one day early if pharmacy is closed on scheduled refill date. Do not fill until: 09/27/16 To last until: 10/27/16  . gabapentin (NEURONTIN) 600 MG tablet    Sig: Take 1 tablet (600 mg total) by mouth every 8 (eight) hours.    Dispense:  270 tablet    Refill:  0    Do not place this medication, or any other prescription from our practice, on "Automatic Refill". Patient may have prescription filled one day early if pharmacy is closed on scheduled refill date.   New Prescriptions   No medications on file   Medications administered today: Douglas Andrews had no medications administered during this visit. Lab-work, procedure(s), and/or referral(s): Orders Placed This Encounter  Procedures  . Lumbar Epidural Injection   Imaging and/or referral(s): None  Interventional therapies: Planned, scheduled, and/or pending:   Midline L4-5 lumbar epidural steroid injection under fluoroscopic guidance and IV sedation.    Considering:   Diagnostic bilateral Lumbar facet block under fluoroscopic guidance and IV sedation.  Possible bilateral lumbar facet radiofrequency ablation, depending on the results of the diagnostic injection.  Diagnostic right intra-articular hip injection under fluoroscopic guidance, with a without sedation.  Possible radiofrequency of the right hip joint depending on the diagnostic injection.  Diagnostic Right L4-5 lumbar epidural steroid injection under fluoroscopic guidance, with or without sedation.  Diagnostic bilateral intra-articular knee injection, without fluoroscopic guidance and IV sedation.  Possible series of 5  Hyalgan knee injections.  Diagnostic bilateral Genicular nerve block under fluoroscopic guidance, with a without sedation.  Possible bilateral Genicular nerve radiofrequency ablation depending on the results of the diagnostic injection.    Palliative PRN treatment(s):   Diagnostic bilateral Lumbar facet block under fluoroscopic guidance and IV sedation.  Diagnostic right intra-articular hip injection under fluoroscopic guidance, with a without sedation.  Diagnostic Right L4-5 lumbar epidural steroid injection under fluoroscopic guidance, with or without sedation.  Diagnostic bilateral intra-articular knee injection, without fluoroscopic guidance and IV sedation.  Possible series of 5 Hyalgan knee injections.  Diagnostic bilateral Genicular nerve block under fluoroscopic guidance, with a without sedation.    Provider-requested follow-up: Return in about 3 months (around 12/21/2016) for Med-Mgmt, in addition, procedure: L4-5 LESI.  Future Appointments Date Time Provider [place]  12/20/2016 9:45 AM Milinda Pointer, MD [facility] None   Primary Care Physician: Fritzi Mandes, MD Location: [facility] Note by: Kathlen Brunswick. Dossie Arbour, M.D, DABA, DABAPM, DABPM, DABIPP, FIPP Date: 09/22/16; Time: 2:22 PM  Pain Score Disclaimer: We use the NRS-11 scale. This is a self-reported, subjective measurement of pain severity with only modest accuracy. It is used primarily to identify changes within a particular patient. It must be understood that outpatient pain scales are significantly less accurate that those used for research, where they can be applied under ideal controlled circumstances with minimal exposure to variables. In reality, the score is likely to be a combination of pain intensity and pain affect, where pain affect describes the degree of emotional arousal or changes in action readiness caused by the sensory experience of pain. Factors such as social and  work situation, setting, emotional state, anxiety levels, expectation, and prior  pain experience may influence pain perception and show large inter-individual differences that may also be affected by time variables.  Patient instructions provided during this appointment: There are no Patient Instructions on file for this visit.

## 2016-09-22 NOTE — Progress Notes (Signed)
Nursing Pain Medication Assessment:  Safety precautions to be maintained throughout the outpatient stay will include: orient to surroundings, keep bed in low position, maintain call bell within reach at all times, provide assistance with transfer out of bed and ambulation.  Medication Inspection Compliance: Pill count conducted under aseptic conditions, in front of the patient. Neither the pills nor the bottle was removed from the patient's sight at any time. Once count was completed pills were immediately returned to the patient in their original bottle.  Medication: Oxycodone/APAP Pill Count: 42 of 120 pills remain Bottle Appearance: Standard pharmacy container. Clearly labeled. Filled Date: 9711 / 21 2017 Medication last intake:09-22-16 at 0730 Bag of meds fell out of his vehicle this morning and he ran over them, crushing many of the pills. Counted the pills in the bubble card and ones loose in the bag, counted 41 or 42 pills

## 2016-10-12 ENCOUNTER — Encounter: Payer: Self-pay | Admitting: Pain Medicine

## 2016-10-12 ENCOUNTER — Ambulatory Visit (HOSPITAL_BASED_OUTPATIENT_CLINIC_OR_DEPARTMENT_OTHER): Payer: Medicare Other | Admitting: Pain Medicine

## 2016-10-12 ENCOUNTER — Ambulatory Visit
Admission: RE | Admit: 2016-10-12 | Discharge: 2016-10-12 | Disposition: A | Discharge status: Home / Self Care | Payer: Medicare Other | Source: Ambulatory Visit | Attending: Pain Medicine | Admitting: Pain Medicine

## 2016-10-12 VITALS — BP 154/94 | HR 66 | Temp 97.8°F | Resp 13 | Ht 68.0 in | Wt 180.0 lb

## 2016-10-12 DIAGNOSIS — G8929 Other chronic pain: Secondary | ICD-10-CM | POA: Insufficient documentation

## 2016-10-12 DIAGNOSIS — M48061 Spinal stenosis, lumbar region without neurogenic claudication: Secondary | ICD-10-CM | POA: Insufficient documentation

## 2016-10-12 DIAGNOSIS — M5116 Intervertebral disc disorders with radiculopathy, lumbar region: Secondary | ICD-10-CM | POA: Insufficient documentation

## 2016-10-12 DIAGNOSIS — M545 Low back pain: Secondary | ICD-10-CM | POA: Diagnosis not present

## 2016-10-12 DIAGNOSIS — M5441 Lumbago with sciatica, right side: Secondary | ICD-10-CM

## 2016-10-12 DIAGNOSIS — M5136 Other intervertebral disc degeneration, lumbar region: Secondary | ICD-10-CM

## 2016-10-12 DIAGNOSIS — M5416 Radiculopathy, lumbar region: Secondary | ICD-10-CM

## 2016-10-12 DIAGNOSIS — M79604 Pain in right leg: Secondary | ICD-10-CM | POA: Diagnosis not present

## 2016-10-12 DIAGNOSIS — M4316 Spondylolisthesis, lumbar region: Secondary | ICD-10-CM | POA: Insufficient documentation

## 2016-10-12 MED ORDER — TRIAMCINOLONE ACETONIDE 40 MG/ML IJ SUSP
INTRAMUSCULAR | Status: AC
  Administered 2016-10-12: 13:00:00
  Filled 2016-10-12: qty 1

## 2016-10-12 MED ORDER — LIDOCAINE HCL (PF) 1 % IJ SOLN: 10.0000 mL | Freq: Once | INTRAMUSCULAR | Status: DC

## 2016-10-12 MED ORDER — LIDOCAINE HCL (PF) 1 % IJ SOLN
INTRAMUSCULAR | Status: AC
  Administered 2016-10-12: 13:00:00
  Filled 2016-10-12: qty 5

## 2016-10-12 MED ORDER — SODIUM CHLORIDE 0.9% FLUSH: 2.0000 mL | Freq: Once | INTRAVENOUS | Status: DC

## 2016-10-12 MED ORDER — ROPIVACAINE HCL 2 MG/ML IJ SOLN
INTRAMUSCULAR | Status: AC
Start: 2016-10-12 — End: 2016-10-12
  Administered 2016-10-12: 13:00:00
  Filled 2016-10-12: qty 10

## 2016-10-12 MED ORDER — ROPIVACAINE HCL 2 MG/ML IJ SOLN: 2.0000 mL | Freq: Once | INTRAMUSCULAR | Status: DC

## 2016-10-12 MED ORDER — TRIAMCINOLONE ACETONIDE 40 MG/ML IJ SUSP: 40.0000 mg | Freq: Once | INTRAMUSCULAR | Status: DC

## 2016-10-12 MED ORDER — SODIUM CHLORIDE 0.9 % IJ SOLN
INTRAMUSCULAR | Status: AC
Start: 2016-10-12 — End: 2016-10-12
  Administered 2016-10-12: 13:00:00
  Filled 2016-10-12: qty 20

## 2016-10-12 MED ORDER — IOPAMIDOL (ISOVUE-M 200) INJECTION 41%
10.0000 mL | Freq: Once | INTRAMUSCULAR | Status: DC
  Filled 2016-10-12: qty 10

## 2016-10-12 NOTE — Progress Notes (Signed)
Safety precautions to be maintained throughout the outpatient stay will include: orient to surroundings, keep bed in low position, maintain call bell within reach at all times, provide assistance with transfer out of bed and ambulation.  

## 2016-10-12 NOTE — Patient Instructions (Signed)
Pain Management Discharge Instructions  General Discharge Instructions :  If you need to reach your doctor call: Monday-Friday 8:00 am - 4:00 pm at [phone] or toll free [phone].  After clinic hours [phone] to have operator reach doctor.  Bring all of your medication bottles to all your appointments in the pain clinic.  To cancel or reschedule your appointment with Pain Management please remember to call 24 hours in advance to avoid a fee.  Refer to the educational materials which you have been given on: General Risks, I had my Procedure. Discharge Instructions, Post Sedation.  Post Procedure Instructions:  The drugs you were given will stay in your system until tomorrow, so for the next 24 hours you should not drive, make any legal decisions or drink any alcoholic beverages.  You may eat anything you prefer, but it is better to start with liquids then soups and crackers, and gradually work up to solid foods.  Please notify your doctor immediately if you have any unusual bleeding, trouble breathing or pain that is not related to your normal pain.  Depending on the type of procedure that was done, some parts of your body may feel week and/or numb.  This usually clears up by tonight or the next day.  Walk with the use of an assistive device or accompanied by an adult for the 24 hours.  You may use ice on the affected area for the first 24 hours.  Put ice in a Ziploc bag and cover with a towel and place against area 15 minutes on 15 minutes off.  You may switch to heat after 24 hours.Epidural Steroid Injection An epidural steroid injection is a shot of steroid medicine and numbing medicine that is given into the space between the spinal cord and the bones in your back (epidural space). The shot helps relieve pain caused by an irritated or swollen nerve root. The amount of pain relief you get from the injection depends on what is causing the nerve to be swollen and irritated,  and how long your pain lasts. You are more likely to benefit from this injection if your pain is strong and comes on suddenly rather than if you have had pain for a long time. Tell a health care provider about:  Any allergies you have.  All medicines you are taking, including vitamins, herbs, eye drops, creams, and over-the-counter medicines.  Any problems you or family members have had with anesthetic medicines.  Any blood disorders you have.  Any surgeries you have had.  Any medical conditions you have.  Whether you are pregnant or may be pregnant. What are the risks? Generally, this is a safe procedure. However, problems may occur, including:  Headache.  Bleeding.  Infection.  Allergic reaction to medicines.  Damage to your nerves. What happens before the procedure? Staying hydrated  Follow instructions from your health care provider about hydration, which may include:  Up to 2 hours before the procedure - you may continue to drink clear liquids, such as water, clear fruit juice, black coffee, and plain tea. Eating and drinking restrictions  Follow instructions from your health care provider about eating and drinking, which may include:  8 hours before the procedure - stop eating heavy meals or foods such as meat, fried foods, or fatty foods.  6 hours before the procedure - stop eating light meals or foods, such as toast or cereal.  6 hours before the procedure - stop drinking milk or drinks that contain milk.    2 hours before the procedure - stop drinking clear liquids. Medicine  You may be given medicines to lower anxiety.  Ask your health care provider about:  Changing or stopping your regular medicines. This is especially important if you are taking diabetes medicines or blood thinners.  Taking medicines such as aspirin and ibuprofen. These medicines can thin your blood. Do not take these medicines before your procedure if your health care provider instructs  you not to. General instructions  Plan to have someone take you home from the hospital or clinic. What happens during the procedure?  You may receive a medicine to help you relax (sedative).  You will be asked to lie on your abdomen.  The injection site will be cleaned.  A numbing medicine (local anesthetic) will be used to numb the injection site.  A needle will be inserted through your skin into the epidural space. You may feel some discomfort when this happens. An X-ray machine will be used to make sure the needle is put as close as possible to the affected nerve.  A steroid medicine and a local anesthetic will be injected into the epidural space.  The needle will be removed.  A bandage (dressing) will be put over the injection site. What happens after the procedure?  Your blood pressure, heart rate, breathing rate, and blood oxygen level will be monitored until the medicines you were given have worn off.  Your arm or leg may feel weak or numb for a few hours.  The injection site may feel sore.  Do not drive for 24 hours if you received a sedative. This information is not intended to replace advice given to you by your health care provider. Make sure you discuss any questions you have with your health care provider. Document Released: 01/03/2008 Document Revised: 03/09/2016 Document Reviewed: 01/12/2016 [employer] Interactive Patient Education  2017 [employer]  

## 2016-10-12 NOTE — Progress Notes (Signed)
Patient's Name: Douglas Andrews  MRN: [id number]  Referring Provider: Delano MetzNaveira, Thayden Lemire, MD  DOB: September 10, 1951  PCP: Katharine LookStephanie J Foley, MD  DOS: 10/12/2016  Note by: Sydnee LevansFrancisco A. Laban EmperorNaveira, MD  Service setting: Ambulatory outpatient  Location: [facility] (AMB) Pain Management Facility  Visit type: Procedure  Specialty: Interventional Pain Management  Patient type: Established   Primary Reason for Visit: Interventional Pain Management Treatment. CC: Back Pain (lower)  Procedure:  Anesthesia, Analgesia, Anxiolysis:  Type: Therapeutic Inter-Laminar Epidural Steroid Injection Region: Lumbar Level: L4-5 Level. Laterality: Left-Sided Paramedial  Type: Local Anesthesia Local Anesthetic: Lidocaine 1% Route: Infiltration ([place]/IM) IV Access: Declined Sedation: Declined  Indication(s): Analgesia          Indications: 1.  Lumbar DDD (degenerative disc disease)   2. Chronic midline low back pain without sciatica   3. Chronic lower extremity pain (Location of Secondary source of pain) (Right)   4. Chronic low back pain (Location of Primary Source of Pain) (Bilateral) (L>R)   5. 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 (year old), male patient, seen today for interventional treatment. He  has a past surgical history that includes Appendectomy; Tonsillectomy; and left rod.. His primarily concern today is the Back Pain (lower) The primary encounter diagnosis was  Lumbar DDD (degenerative disc disease). 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  Pain Type:  (pt numb) Pain Location: Axilla Pain Orientation: Lower, Medial Pain  Descriptors / Indicators: Throbbing Pain Frequency: Constant  Date of Last Visit: 09/22/16 Service Provided on Last Visit: Evaluation  Coagulation Parameters No results found for: INR, LABPROT, APTT, PLT Verification of the correct person, correct site (including marking of site), and correct procedure were performed and confirmed by the patient.  Consent: Before the procedure and under the influence of no sedative(s), amnesic(s), or anxiolytics, the patient was informed of the treatment options, risks and possible complications. To fulfill our ethical and legal obligations, as recommended by the American Medical Association's Code of Ethics, I have informed the patient of my clinical impression; the nature and purpose of the treatment or procedure; the risks, benefits, and possible complications of the intervention; the alternatives, including doing nothing; the risk(s) and benefit(s) of the alternative treatment(s) or procedure(s); and the risk(s) and benefit(s) of doing nothing. The patient was provided information about the general risks and possible complications associated with the procedure. These may include, but are not limited to: failure to achieve desired goals, infection, bleeding, organ or nerve damage, allergic reactions, paralysis, and death. In addition, the patient was informed of those risks and complications associated to Spine-related procedures, such as failure to decrease pain; infection (i.e.: Meningitis, epidural or intraspinal abscess); bleeding (i.e.: epidural hematoma, subarachnoid hemorrhage, or any other type of intraspinal or peri-dural bleeding); organ or nerve damage (i.e.: Any type of peripheral nerve, nerve root, or spinal cord injury) with subsequent damage to sensory, motor, and/or autonomic systems, resulting in permanent pain, numbness, and/or weakness of one or several areas of the body; allergic reactions; (i.e.: anaphylactic reaction); and/or  death. Furthermore, the patient was informed of those risks and complications associated with the medications. These include, but are not limited to: allergic  reactions (i.e.: anaphylactic or anaphylactoid reaction(s)); adrenal axis suppression; blood sugar elevation that in diabetics may result in ketoacidosis or comma; water retention that in patients with history of congestive heart failure may result in shortness of breath, pulmonary edema, and decompensation with resultant heart failure; weight gain; swelling or edema; medication-induced neural toxicity; particulate matter embolism and blood vessel occlusion with resultant organ, and/or nervous system infarction; and/or aseptic necrosis of one or more joints. Finally, the patient was informed that Medicine is not an exact science; therefore, there is also the possibility of unforeseen or unpredictable risks and/or possible complications that may result in a catastrophic outcome. The patient indicated having understood very clearly. We have given the patient no guarantees and we have made no promises. Enough time was given to the patient to ask questions, all of which were answered to the patient's satisfaction. Douglas Andrews has indicated that he wanted to continue with the procedure.  Consent Attestation: I, the ordering provider, attest that I have discussed with the patient the benefits, risks, side-effects, alternatives, likelihood of achieving goals, and potential problems during recovery for the procedure that I have provided informed consent.  Pre-Procedure Preparation:  Safety Precautions: Allergies reviewed. The patient was asked about blood thinners, or active infections, both of which were denied. The patient was asked to confirm the procedure and laterality, before marking the site, and again before commencing the procedure. Appropriate site, procedure, and patient were confirmed by following the Joint Commission's Universal Protocol  (UP.01.01.01), in the form of a "Time Out". The patient was asked to participate by confirming the accuracy of the "Time Out" information. Patient was assessed for positional comfort and pressure points before starting the procedure. Allergies: He has No Known Allergies. Allergy Precautions: None required Infection Control Precautions: Sterile technique used. Standard Universal Precautions were taken as recommended by the Department of [facility] for Disease Control and Prevention (CDC). Standard pre-surgical skin prep was conducted. Respiratory hygiene and cough etiquette was practiced. Hand hygiene observed. Safe injection practices and needle disposal techniques followed. SDV (single dose vial) medications used. Medications properly checked for expiration dates and contaminants. Personal protective equipment (PPE) used as per protocol. Monitoring:  As per clinic protocol. Vitals:   10/12/16 0910 10/12/16 0920 10/12/16 0925 10/12/16 0935  BP: (!) 162/99 (!) 162/97 (!) 163/98 (!) 154/94  Pulse: 70 66 67 66  Resp: 16 16 12 13   Temp:      TempSrc:      SpO2: 100% 100% 99% 99%  Weight:      Height:      Calculated BMI: Body mass index is 27.37 kg/m. Time-out: "Time-out" completed before starting procedure, as per protocol.  Imaging Review  Lumbosacral Imaging: Lumbar MR wo contrast:  Results for orders placed during the hospital encounter of 05/03/16  MR Lumbar Spine Wo Contrast   Narrative CLINICAL DATA:  Low back pain for over 10 years radiating into the right leg to the knee. No known injury. Initial encounter. EXAM: MRI LUMBAR SPINE WITHOUT CONTRAST TECHNIQUE: Multiplanar, multisequence MR imaging of the lumbar spine was performed. No intravenous contrast was administered. COMPARISON:  None. FINDINGS: Segmentation:  Unremarkable. Alignment: Trace anterolisthesis L5 on S1 due to facet degenerative disease is seen. Vertebrae: No fracture or worrisome marrow lesion.  Scattered Schmorl's nodes are noted. Conus medullaris: Extends to the L1 level and appears normal. Paraspinal and other soft tissues: Unremarkable. Disc levels: T12-L1:  Negative. L1-2:  Negative. L2-3:  Negative. L3-4: Small right paracentral  disc protrusion causes mild narrowing the right lateral recess. The disc slightly deflects the descending right L4 root without compressing it. The foramina are open. L4-5: Mild ligamentum flavum thickening and facet degenerative disease. Minimal disc bulge without central canal or foraminal stenosis. L5-S1: There is facet degenerative change. The disc is uncovered with a shallow bulge. The central canal and foramina are widely patent. IMPRESSION: Small right side disc protrusion at L3-4 slightly deflects the descending right L4 root without compression. Mild disc bulging L4-5 and L5-S1 without central canal or foraminal stenosis. Electronically Signed   By: Drusilla Kanner M.D.   On: 05/03/2016 09:17   Lumbar DG (Complete) 4+V:  Results for orders placed during the hospital encounter of 08/22/16  DG Lumbar Spine Complete   Narrative CLINICAL DATA:  Lumbago with several recent falls  EXAM: LUMBAR SPINE - COMPLETE 4+ VIEW  COMPARISON:  None.  FINDINGS: Frontal, lateral, spot lumbosacral lateral, and bilateral oblique views were obtained. There are 5 non-rib-bearing lumbar type vertebral bodies. T12 ribs are hypoplastic. There is mild lumbar levoscoliosis. There is no fracture. There is 2 mm of retrolisthesis of L4 on L5. There is 7 mm of anterolisthesis of L5 on S1. There is no other evident spondylolisthesis. There is an unfused apophysis along the anterior superior aspect of the L3 vertebral body. Pars defects are noted at L5 bilaterally. There is mild disc space narrowing at L4-5 and L5-S1. There is facet osteoarthritic change at L4-5 and L5-S1 bilaterally.  IMPRESSION: Port defects at L5 bilaterally with spondylolisthesis at  L4-5 and L5-S1. No fracture. Osteoarthritic change at L4-5 and L5-S1.   Electronically Signed   By: Bretta Bang III M.D.   On: 08/22/2016 10:33    Description of Procedure Process:   Position: Prone with head of the table was raised to facilitate breathing. Target Area: The interlaminar space, initially targeting the lower laminar border of the superior vertebral body. Approach: Paramedial approach. Area Prepped: Entire Posterior Lumbar Region Prepping solution: ChloraPrep (2% chlorhexidine gluconate and 70% isopropyl alcohol) Safety Precautions: Aspiration looking for blood return was conducted prior to all injections. At no point did we inject any substances, as a needle was being advanced. No attempts were made at seeking any paresthesias. Safe injection practices and needle disposal techniques used. Medications properly checked for expiration dates. SDV (single dose vial) medications used. Description of the Procedure: Protocol guidelines were followed. The procedure needle was introduced through the skin, ipsilateral to the reported pain, and advanced to the target area. Bone was contacted and the needle walked caudad, until the lamina was cleared. The epidural space was identified using "loss-of-resistance technique" with 2-3 ml of PF-NaCl (0.9% NSS), in a 5cc LOR glass syringe. EBL: None Materials & Medications:  Needle(s) Used: 20g - 10cm, Tuohy-style epidural needle Medication(s): see below.  Imaging Guidance (Spinal):  Type of Imaging Technique: Fluoroscopy Guidance (Spinal) Indication(s): Assistance in needle guidance and placement for procedures requiring needle placement in or near specific anatomical locations not easily accessible without such assistance. Exposure Time: Please see nurses notes. Contrast: Before injecting any contrast, we confirmed that the patient did not have an allergy to iodine, shellfish, or radiological contrast. Once satisfactory needle placement  was completed at the desired level, radiological contrast was injected. Contrast injected under live fluoroscopy. No contrast complications. See chart for type and volume of contrast used. Fluoroscopic Guidance: I was personally present during the use of fluoroscopy. "Tunnel Vision Technique" used to obtain the best possible view of  the target area. Parallax error corrected before commencing the procedure. "Direction-depth-direction" technique used to introduce the needle under continuous pulsed fluoroscopy. Once target was reached, antero-posterior, oblique, and lateral fluoroscopic projection used confirm needle placement in all planes. Images permanently stored in EMR. Interpretation: I personally interpreted the imaging intraoperatively. Adequate needle placement confirmed in multiple planes. Appropriate spread of contrast into desired area was observed. No evidence of afferent or efferent intravascular uptake. No intrathecal or subarachnoid spread observed. Permanent images saved into the patient's record.  Antibiotic Prophylaxis:  Indication(s): No indications identified. Type:  Antibiotics Given (last 72 hours)    None      Post-operative Assessment:  Complications: No immediate post-treatment complications observed by team, or reported by patient. Disposition: The patient tolerated the entire procedure well. A repeat set of vitals were taken after the procedure and the patient was kept under observation following institutional policy, for this type of procedure. Post-procedural neurological assessment was performed, showing return to baseline, prior to discharge. The patient was provided with post-procedure discharge instructions, including a section on how to identify potential problems. Should any problems arise concerning this procedure, the patient was given instructions to immediately contact us, at any time, without hesitation. In any case, we plan to contact the patient by telephone for a  follow-up status report regarding this interventional procedure. Comments:  No additional relevant information.  Plan of Care  Discharge to: Discharge home  Medications ordered for procedure: Meds ordered this encounter  Medications  . iopamidol (ISOVUE-M) 41 % intrathecal injection 10 mL  . triamcinolone acetonide (KENALOG-40) injection 40 mg  . lidocaine (PF) (XYLOCAINE) 1 % injection 10 mL  . sodium chloride flush (NS) 0.9 % injection 2 mL  . ropivacaine (PF) 2 mg/mL (0.2%) (NAROPIN) injection 2 mL  . lidocaine (PF) (XYLOCAINE) 1 % injection    GARNER, CYNTHIA: cabinet override  . sodium chloride 0.9 % injection    GARNER, CYNTHIA: cabinet override  . ropivacaine (PF) 2 mg/mL (0.2%) (NAROPIN) 2 MG/ML injection    GARNER, CYNTHIA: cabinet override  . triamcinolone acetonide (KENALOG-40) 40 MG/ML injection    GARNER, CYNTHIA: cabinet override   Medications administered: (For more details, see medical record) Mr. Veldhuizen had no medications administered during this visit. Lab-work, Procedure(s), & Referral(s) Ordered: Orders Placed This Encounter  Procedures  . DG C-Arm 1-60 Min-No Report   Imaging Ordered: Results for orders placed in visit on 09/05/16  DG C-Arm 1-60 Min-No Report   Narrative Fluoroscopy was utilized by the requesting physician.  No radiographic  interpretation.    New Prescriptions   No medications on file   Physician-requested Follow-up:  Return in about 2 weeks (around 10/26/2016) for Post-Procedure evaluation.  Future Appointments Date Time Provider Department Center  11/14/2016 1:45 PM Delano Metz, MD [facility] None  12/20/2016 9:45 AM Delano Metz, MD [facility] None   Primary Care Physician: Katharine Look, MD Location: [facility] Note by: Sydnee Levans. Laban Emperor, M.D, DABA, DABAPM, DABPM, DABIPP, FIPP Date: 10/12/16; Time: 11:35 AM  Disclaimer:  Medicine is not an Visual merchandiser. The only guarantee in  medicine is that nothing is guaranteed. It is important to note that the decision to proceed with this intervention was based on the information collected from the patient. The Data and conclusions were drawn from the patient's questionnaire, the interview, and the physical examination. Because the information was provided in large part by the patient, it cannot be guaranteed that it has not been purposely  or unconsciously manipulated. Every effort has been made to obtain as much relevant data as possible for this evaluation. It is important to note that the conclusions that lead to this procedure are derived in large part from the available data. Always take into account that the treatment will also be dependent on availability of resources and existing treatment guidelines, considered by other Pain Management Practitioners as being common knowledge and practice, at the time of the intervention. For Medico-Legal purposes, it is also important to point out that variation in procedural techniques and pharmacological choices are the acceptable norm. The indications, contraindications, technique, and results of the above procedure should only be interpreted and judged by a Board-Certified Interventional Pain Specialist with extensive familiarity and expertise in the same exact procedure and technique. Attempts at providing opinions without similar or greater experience and expertise than that of the treating physician will be considered as inappropriate and unethical, and shall result in a formal complaint to the state medical board and applicable specialty societies.  Instructions provided at this appointment: Patient Instructions  Pain Management Discharge Instructions  General Discharge Instructions :  If you need to reach your doctor call: Monday-Friday 8:00 am - 4:00 pm at [phone] or toll free [phone].  After clinic hours [phone] to have operator reach doctor.  Bring all of your  medication bottles to all your appointments in the pain clinic.  To cancel or reschedule your appointment with Pain Management please remember to call 24 hours in advance to avoid a fee.  Refer to the educational materials which you have been given on: General Risks, I had my Procedure. Discharge Instructions, Post Sedation.  Post Procedure Instructions:  The drugs you were given will stay in your system until tomorrow, so for the next 24 hours you should not drive, make any legal decisions or drink any alcoholic beverages.  You may eat anything you prefer, but it is better to start with liquids then soups and crackers, and gradually work up to solid foods.  Please notify your doctor immediately if you have any unusual bleeding, trouble breathing or pain that is not related to your normal pain.  Depending on the type of procedure that was done, some parts of your body may feel week and/or numb.  This usually clears up by tonight or the next day.  Walk with the use of an assistive device or accompanied by an adult for the 24 hours.  You may use ice on the affected area for the first 24 hours.  Put ice in a Ziploc bag and cover with a towel and place against area 15 minutes on 15 minutes off.  You may switch to heat after 24 hours.Epidural Steroid Injection An epidural steroid injection is a shot of steroid medicine and numbing medicine that is given into the space between the spinal cord and the bones in your back (epidural space). The shot helps relieve pain caused by an irritated or swollen nerve root. The amount of pain relief you get from the injection depends on what is causing the nerve to be swollen and irritated, and how long your pain lasts. You are more likely to benefit from this injection if your pain is strong and comes on suddenly rather than if you have had pain for a long time. Tell a health care provider about:  Any allergies you have.  All medicines you are taking, including  vitamins, herbs, eye drops, creams, and over-the-counter medicines.  Any problems you or family members  have had with anesthetic medicines.  Any blood disorders you have.  Any surgeries you have had.  Any medical conditions you have.  Whether you are pregnant or may be pregnant. What are the risks? Generally, this is a safe procedure. However, problems may occur, including:  Headache.  Bleeding.  Infection.  Allergic reaction to medicines.  Damage to your nerves. What happens before the procedure? Staying hydrated  Follow instructions from your health care provider about hydration, which may include:  Up to 2 hours before the procedure - you may continue to drink clear liquids, such as water, clear fruit juice, black coffee, and plain tea. Eating and drinking restrictions  Follow instructions from your health care provider about eating and drinking, which may include:  8 hours before the procedure - stop eating heavy meals or foods such as meat, fried foods, or fatty foods.  6 hours before the procedure - stop eating light meals or foods, such as toast or cereal.  6 hours before the procedure - stop drinking milk or drinks that contain milk.  2 hours before the procedure - stop drinking clear liquids. Medicine  You may be given medicines to lower anxiety.  Ask your health care provider about:  Changing or stopping your regular medicines. This is especially important if you are taking diabetes medicines or blood thinners.  Taking medicines such as aspirin and ibuprofen. These medicines can thin your blood. Do not take these medicines before your procedure if your health care provider instructs you not to. General instructions  Plan to have someone take you home from the hospital or clinic. What happens during the procedure?  You may receive a medicine to help you relax (sedative).  You will be asked to lie on your abdomen.  The injection site will be  cleaned.  A numbing medicine (local anesthetic) will be used to numb the injection site.  A needle will be inserted through your skin into the epidural space. You may feel some discomfort when this happens. An X-ray machine will be used to make sure the needle is put as close as possible to the affected nerve.  A steroid medicine and a local anesthetic will be injected into the epidural space.  The needle will be removed.  A bandage (dressing) will be put over the injection site. What happens after the procedure?  Your blood pressure, heart rate, breathing rate, and blood oxygen level will be monitored until the medicines you were given have worn off.  Your arm or leg may feel weak or numb for a few hours.  The injection site may feel sore.  Do not drive for 24 hours if you received a sedative. This information is not intended to replace advice given to you by your health care provider. Make sure you discuss any questions you have with your health care provider. Document Released: 01/03/2008 Document Revised: 03/09/2016 Document Reviewed: 01/12/2016 Elsevier Interactive Patient Education  2017 [employer].

## 2016-10-13 ENCOUNTER — Telehealth: Payer: Self-pay

## 2016-10-13 NOTE — Telephone Encounter (Signed)
Post Procedure phone call.  Patient states he is doing OK.

## 2016-11-10 DIAGNOSIS — R197 Diarrhea, unspecified: Secondary | ICD-10-CM | POA: Insufficient documentation

## 2016-11-14 ENCOUNTER — Ambulatory Visit: Payer: Medicare Other | Admitting: Pain Medicine

## 2016-11-14 NOTE — Progress Notes (Deleted)
Patient's Name: Douglas Andrews  MRN: [id number]  Referring Provider: Katharine Look, MD  DOB: 1951-09-21  PCP: Katharine Look, MD  DOS: 11/14/2016  Note by: Sydnee Levans. Laban Emperor, MD  Service setting: Ambulatory outpatient  Specialty: Interventional Pain Management  Location: [facility] (AMB) Pain Management Facility    Patient type: Established   Primary Reason(s) for Visit: Encounter for post-procedure evaluation of chronic illness with mild to moderate exacerbation CC: No chief complaint on file.  HPI  [name] is a 66 y.o. year old, male patient, who comes today for a post-procedure evaluation. He has Chronic low back pain (Location of Primary Source of Pain) (Bilateral) (L>R); Chronic pain syndrome;  Lumbar DDD (degenerative disc disease);  Lumbar Spondylosis; Lumbar radicular pain (Right) (L5 Dermatome); Lumbar facet syndrome (Location of Primary Source of Pain) (Bilateral) (R>L); Opiate use (60 MME/Day); Uncomplicated opioid dependence (HCC); Long term prescription opiate use; Chronic knee pain (Location of Tertiary source of pain) (Bilateral) (Right); Vitamin D insufficiency; Overweight; Bell palsy; CN (constipation); Dermatitis, eczematoid; Diabetes mellitus (HCC); Dysmetria; Essential (primary) hypertension; Anxiety, generalized; Insomnia due to medical condition; Long term current use of systemic steroids; Type 2 diabetes mellitus (HCC); Pulmonary sarcoidosis (HCC); Arthropathy, traumatic, shoulder; Bursitis, trochanteric; Esophageal varices (HCC); Leg varices; Hepatic cirrhosis (HCC); Major depressive disorder with single episode; Other long term (current) drug therapy; At risk for falling; Encounter for chronic pain management; Encounter for therapeutic drug level monitoring; Chronic lower extremity pain (Location of Secondary source of pain) (Right); Chronic lumbar radicular pain (Left) (L5 Dermatome); Lumbar Levoscoliosis; Neurogenic pain; Osteoarthritis of knee (Right); At high risk for falls;  Chest pain; Skin lesion; Pain of left femur (left femoral rod due to sarcoidosis); Chronic hip pain (Right); Osteoarthritis of knee (Bilateral) (R>L); Essential hypertension; Folliculitis; Chronic left sacroiliac joint pain; Chronic hip pain, left; and Chronic left-sided thoracic back pain on his problem list. His primarily concern today is the No chief complaint on file.  Pain Assessment: Self-Reported Pain Score:  /10 {Blank single:19197::"Clinically the patient looks like a","     "} {Blank single:19197::"0/10","1/10","2/10","3/10","4/10","5/10","     "} {Blank single:19197::"Reported level is inconsistent with clinical observations.","Clear symptom exaggeration. Reported level of pain is not compatible with clinical observations.","Reported level is compatible with observation."} {Blank single:19197::"Information on the proper use of the pain scale provided to the patient today","Exaggerated score may be due to the reporting of a "suffering" component","Score may indicate symptom exaggeration","     "}    [name] comes in today for post-procedure evaluation after the treatment done on 10/12/2016.  Further details on both, my assessment(s), as well as the proposed treatment plan, please see below.  Post-Procedure Assessment  10/12/2016 Procedure: *** Post-procedure pain score: {Blank single:19197::"1","2","3","4","5","0"}/10 {Blank single:19197::"(100% relief)","(More than 50% relief)","No relief","       "} Influential Factors: BMI:   Intra-procedural challenges: {Blank single:19197::"Increased level of difficulty due to lack of cooperation on the part of Mr. Trip, Cavanagh level of difficulty due to Mr. Vandevelde anatomy","Increased level of difficulty due to Mr. Kedzierski obesity  ","Increased level of difficulty due to Mr. Mia size","None observed"} Assessment challenges: {Blank single:19197::"Inadequate record keeping by Mr. Jaleen, Finch. Jeanbaptiste did not keep his scheduled follow-up  appointment","Possible secondary gain issues suspected","Results reported today are inconsistent with those reported on procedure day, immediately before discharge.","None detected"} {Blank single:19197::"Previously the patient had reported 100% relief of the pain, before leaving the facility","Lack of understanding of post-procedural reporting, despite initial explanations","       "} Post-procedural side-effects, adverse  reactions, or complications: {Blank single:19197::"New onset of movement-associated discomfort","New onset impaired sensorium","New onset weakness","Bleeding","Infection","Nerve damage","Allergic reaction","Transient post-procedural increase in pain","None reported"} Reported issues: {Blank single:19197::"Transient post-op increase in pain","Persistent worsening of the pain","No benefits","No significant issues reported","None"}  Sedation: {Blank single:19197::"Sedation provided.","No sedation used.","Please see nurses note."} When no sedatives are used, the analgesic levels obtained are directly associated to the effectiveness of the local anesthetics. However, when sedation is provided, the level of analgesia obtained during the initial 1 hour following the intervention, is believed to be the result of a combination of factors. These factors may include, but are not limited to: 1. The effectiveness of the local anesthetics used. 2. The effects of the analgesic(s) and/or anxiolytic(s) used. 3. The degree of discomfort experienced by the patient at the time of the procedure. 4. The patients ability and reliability in recalling and recording the events. 5. The presence and influence of possible secondary gains and/or psychosocial factors. Reported result: Relief experienced during the 1st hour after the procedure:   (Ultra-Short Term Relief) Interpretative annotation: {Blank single:19197::"Inaccurate and unreliable report.","Poor patient recollection.","Prolonged period between  intervention and post-procedure assessment, possibly leading to poor recollection and inaccurate reporting.","Patient did not keep pain diary.","Patient reporting pain in area not involved in diagnostic test.","No Analgesic administered (i.e.: IV Fentanyl).","No Anxiolytic administered (i.e.: IV Midazolam).","No Analgesic or Anxiolytic given, therefore benefits are completely due to Local Anesthetics.","No relief despite the use of intravenous benzodiazepines and/or opioids would suggest the pain to be unresponsive to these class of drugs. Therefore, the long-term therapeutic use of these pharmacological agents may need to be reconsidered.","Unexpected non-physiological response.","No relief from the local anesthetics would suggest pain etiology to reside elsewhere.","Partial relief from local anesthetics would suggest that the injected area is not 100% responsible for the patient's symptoms.","Analgesia during this period is likely to be Local Anesthetic and/or IV Sedative (Analgesic/Anxiolitic) related."} {Blank single:19197::"Patient does not appear to have understood instructions on differential evaluation of treated vs untreated area, leading to an inaccurate global report","       "}  Effects of local anesthetic: The analgesic effects attained during this period are directly associated to the localized infiltration of local anesthetics and therefore cary significant diagnostic value as to the etiological location, or anatomical origin, of the pain. Expected duration of relief is directly dependent on the pharmacodynamics of the local anesthetic used. {Blank single:19197::"No local anesthetics used.","Short-acting (1hour) anesthetics used.","Long-acting (4-6 hours) anesthetics used."}  Reported result: Relief during the next 4 to 6 hour after the procedure:   (Short-Term Relief) Interpretative annotation: {Blank single:19197::"Inaccurate and unreliable report.","Patient did not keep pain diary.","Patient  admits to poor recollection.","Unexpected non-physiological response.","Patient reporting pain in area not involved in diagnostic test.","No analgesic effect would suggest pain etiology to reside elsewhere.","Partial relief would suggest incomplete involvement of injected area.","Complete relief would suggest area to be the source of the pain."} {Blank single:19197::"Patient does not appear to have understood instructions on differential evaluation of treated vs untreated area, leading to an inaccurate global report","       "}  Long-term benefit: Defined as the period of time past the expected duration of local anesthetics. With the possible exception of prolonged sympathetic blockade from the local anesthetics, benefits during this period are typically attributed to, or associated with, other factors such as analgesic sensory neuropraxia, antiinflammatory effects, or beneficial biochemical changes provided by agents other than the local anesthetics Reported result: Extended relief following procedure:   (Long-Term Relief) Interpretative annotation: {Blank single:19197::"Patient admits to poor recollection.","No benefit. Unexpected therapeutic failure.","No long-term  benefit expected. Mr. Ybarbo requested to have no steroids injected","No long-term benefit. This could suggest limited inflammatory component to the pain with possible mechanical aggravating factors.","No benefit. This could suggest algesic mechanism to be mechanical rather than inflammatory.","Partial relief. Possible incomplete therapeutic success.","Partial relief. This could suggest the algesic mechanism to be a combination of tissue inflammation and mechanical problems.","Good relief. Possible therapeutic success.","Good relief. This could suggest inflammation to be a significant component in the etiology to the pain."} {Blank single:19197::"Relief is believed to be associated to the steroid","Benefit could suggest adequate  neurolysis","Benefit could signal adequate RF ablation","No benefit, or incomplete benefit may suggest therapeutic failure","No benefit, or incomplete benefit may suggest failure to adequately neurolyse the nerve","       "}  Current benefits: Defined as persistent relief that continues at this point in time.   Reported results: Treated area: {Blank single:19197::"0","<25","25","<50","50",">50","<75","75","90","100","***"} % {Blank single:19197::"Mr. Dais reports improvement in function","     "} Interpretative annotation: {Blank single:19197::"No benefit whatsoever. This would argue against repeating therapy","No long-term benefit expected as the patient requested that no steroids be injected","Recurrance of symptoms. This would suggest persistent aggravating factors","Partial benefit. This would suggest further treatment needed","Long-term benefit would suggest adequate anti-inflammatory effects","Long-term benefit would suggest adequate RF ablation","Long-term benefit would suggest adequate neurolysis","Ongoing benefits would suggest effective palliative intervention","Ongoing benefits would suggest effective therapeutic approach"}  Interpretation: Results would suggest {Blank single:19197::"that repeating the procedure may be necessary,","adequate radiofrequency ablation.","this therapy to be effective in the management of Mr. Faircloth condition.","Mr. Brener to be a good candidate for Radiofrequency Ablation.","Mr. Langdon to be a good candidate for a RACZ Procedure.","therapy to have a positive impact on the patient's condition.","area to be involved, however, further evaluation and testing may be required.","non-involvement of the tested area.","a neuropathy associated with permanent nerve damage, persistent neural entrapment, or mechanical compression/impingement, as opposed to an inflammatory-mediated neuropraxia","a successful intervention.","a successful therapeutic intervention.","a successful  palliative intervention.","a successful diagnostic intervention."} {Blank single:19197::"for diagnostic reasons","for therapeutic reasons","We'll proceed with diagnostic intervention #2, as soon as convenient","We'll proceed with the next treatment, as soon as convenient","The patient has failed to respond to conservative therapies including over-the-counter medications, anti-inflammatories, muscle relaxants, membrane stabilizers, opioids, physical therapy, modalities such as heat and ice, as well as more invasive techniques such as nerve blocks. Because Mr. Boylen did attain more than 50% relief of the pain during a series of diagnostic blocks conducted in separate occasions, I believe it is medically necessary to proceed with Radiofrequency Ablation, in order to attempt gaining longer relief.","       "}  Laboratory Chemistry  Inflammation Markers Lab Results  Component Value Date   ESRSEDRATE 1 03/30/2016   CRP <0.5 03/30/2016   Renal Function Lab Results  Component Value Date   BUN 12 03/30/2016   CREATININE 0.85 03/30/2016   GFRAA >60 03/30/2016   GFRNONAA >60 03/30/2016   Hepatic Function Lab Results  Component Value Date   AST 24 03/30/2016   ALT 27 03/30/2016   ALBUMIN 4.6 03/30/2016   Electrolytes Lab Results  Component Value Date   NA 136 03/30/2016   K 3.8 03/30/2016   CL 100 (L) 03/30/2016   CALCIUM 9.2 03/30/2016   MG 2.2 03/30/2016   Pain Modulating Vitamins Lab Results  Component Value Date   25OHVITD1 38 03/30/2016   25OHVITD2 3.4 03/30/2016   25OHVITD3 35 03/30/2016   VITAMINB12 539 03/30/2016   Coagulation Parameters No results found for: INR, LABPROT, APTT, PLT Cardiovascular No results found for: BNP, HGB, HCT Note: {Blank  single:19197::"No results found under the [facility] electronic medical record","Results made available to patient.","Lab results reviewed and made available to patient.","Lab results reviewed and explained to patient in  Layman's terms.","Lab results reviewed."}  Recent Diagnostic Imaging Review  Dg C-arm 1-60 Min-no Report  Result Date: 10/12/2016 There is no Radiologist interpretation  for this exam.  Note: {Blank single:19197::"No new results found.","No results found under the [facility] electronic medical record.","Imaging results reviewed and explained to patient in Layman's terms.","Results of ordered imaging test(s) reviewed and explained to patient in Layman's terms.","Imaging results reviewed."} {Blank single:19197::"Results made available to patient","Copy of results provided to patient","       "}  Meds  The patient has a current medication list which includes the following prescription(s): albuterol, albuterol, aspirin ec, aspirin ec, atorvastatin, cetirizine, vitamin d3, fluoxetine, gabapentin, glucose blood, hydroxyzine, ibuprofen-diphenhydramine cit, insulin glargine, insulin lispro, insulin pen needle, isosorbide mononitrate, lantus solostar, lisinopril, metformin, nitroglycerin, omeprazole, oxycodone-acetaminophen, oxycodone-acetaminophen, oxycodone-acetaminophen, and sertraline.  Current Outpatient Prescriptions on File Prior to Visit  Medication Sig  . albuterol (PROAIR HFA) 108 (90 Base) MCG/ACT inhaler INHALE 2 PUFFS BY MOUTH EVERY 4 HOURS ASNEEDED FOR WHEEZING  . albuterol (PROVENTIL) (5 MG/ML) 0.5% nebulizer solution Take 2.5 mg by nebulization every 6 (six) hours as needed for wheezing or shortness of breath.   Marland Kitchen aspirin EC 81 MG tablet Take 81 mg by mouth daily.  Marland Kitchen aspirin EC 81 MG tablet Take 81 mg by mouth.  Marland Kitchen atorvastatin (LIPITOR) 40 MG tablet Take 40 mg by mouth daily.  . cetirizine (ZYRTEC) 10 MG tablet Take 10 mg by mouth daily.   . Cholecalciferol (VITAMIN D3) 5000 units TABS Take 1 tablet by mouth daily.   Marland Kitchen FLUoxetine (PROZAC) 20 MG capsule Take 20 mg by mouth.  . gabapentin (NEURONTIN) 600 MG tablet Take 1 tablet (600 mg total) by mouth every 8 (eight) hours.  Marland Kitchen glucose  blood (ACCU-CHEK ACTIVE STRIPS) test strip Checks 3 times daily  . hydrOXYzine (VISTARIL) 25 MG capsule Take 50 mg by mouth every 6 (six) hours as needed.   . Ibuprofen-Diphenhydramine Cit (ADVIL PM PO) Take 1 tablet by mouth at bedtime.  . insulin glargine (LANTUS) 100 UNIT/ML injection Inject 60 Units into the skin at bedtime.   . insulin lispro (HUMALOG) 100 UNIT/ML KiwkPen Inject 25 Units into the skin 2 (two) times daily. 20 units at supper time  . Insulin Pen Needle (UNIFINE PENTIPS) 31G X 6 MM MISC   . isosorbide mononitrate (IMDUR) 30 MG 24 hr tablet 30 mg daily.   Marland Kitchen LANTUS SOLOSTAR 100 UNIT/ML Solostar Pen Inject 60 Units into the skin daily at 10 pm.   . lisinopril (PRINIVIL,ZESTRIL) 10 MG tablet Take 10 mg by mouth daily.   . metFORMIN (GLUCOPHAGE) 500 MG tablet Take 1,000 mg by mouth 2 (two) times daily with a meal.  . nitroGLYCERIN (NITROSTAT) 0.4 MG SL tablet Place 0.4 mg under the tongue every 5 (five) minutes as needed for chest pain.  Marland Kitchen omeprazole (PRILOSEC) 20 MG capsule Take 20 mg by mouth daily.  Marland Kitchen oxyCODONE-acetaminophen (PERCOCET) 10-325 MG tablet Take 1 tablet by mouth every 6 (six) hours as needed for pain.  Melene Muller ON 11/26/2016] oxyCODONE-acetaminophen (PERCOCET) 10-325 MG tablet Take 1 tablet by mouth every 6 (six) hours as needed for pain.  Marland Kitchen oxyCODONE-acetaminophen (PERCOCET) 10-325 MG tablet Take 1 tablet by mouth every 6 (six) hours as needed for pain.  Marland Kitchen sertraline (ZOLOFT) 50 MG tablet TAKE 1 TABLET BY  MOUTH ONCE DAILY   No current facility-administered medications on file prior to visit.    ROS  Constitutional: {Blank single:19197::"Denies any fever or chills"} Gastrointestinal: {Blank single:19197::"No reported hemesis, hematochezia, vomiting, or acute GI distress"} Musculoskeletal: {Blank single:19197::"Denies any acute onset joint swelling, redness, loss of ROM, or weakness"} Neurological: {Blank single:19197::"No reported episodes of acute onset apraxia,  aphasia, dysarthria, agnosia, amnesia, paralysis, loss of coordination, or loss of consciousness"}  Allergies  Mr. Jaroszewski has No Known Allergies.  PFSH  Drug: Mr. Ensminger  reports that he does not use drugs. Alcohol:  reports that he does not drink alcohol. Tobacco:  reports that he has never smoked. He has never used smokeless tobacco. Medical:  has a past medical history of Chronic back pain; Diabetes mellitus without complication (HCC); Hyperlipidemia; Hypertension; Left leg pain (01/02/2014); Right leg pain (07/15/2015); and Sarcoidosis (HCC). Family: family history includes Diabetes in his father; Heart disease in his father and mother; Stroke in his father.  Past Surgical History:  Procedure Laterality Date  . APPENDECTOMY    . left rod    . TONSILLECTOMY     Constitutional Exam  General appearance: {general exam:210120802::"Well nourished, well developed, and well hydrated. In no apparent acute distress"} There were no vitals filed for this visit. BMI Assessment: Estimated body mass index is 27.37 kg/m as calculated from the following:   Height as of 10/12/16: 5\' 8"  (1.727 m).   Weight as of 10/12/16: 180 lb (81.6 kg).  BMI interpretation table: BMI level Category Range association with higher incidence of chronic pain  <18 kg/m2 Underweight   18.5-24.9 kg/m2 Ideal body weight   25-29.9 kg/m2 Overweight Increased incidence by 20%  30-34.9 kg/m2 Obese (Class I) Increased incidence by 68%  35-39.9 kg/m2 Severe obesity (Class II) Increased incidence by 136%  >40 kg/m2 Extreme obesity (Class III) Increased incidence by 254%   BMI Readings from Last 4 Encounters:  10/12/16 27.37 kg/m  09/22/16 27.37 kg/m  09/05/16 27.22 kg/m  08/22/16 27.12 kg/m   Wt Readings from Last 4 Encounters:  10/12/16 180 lb (81.6 kg)  09/22/16 180 lb (81.6 kg)  09/05/16 179 lb (81.2 kg)  08/22/16 181 lb (82.1 kg)  Psych/Mental status: {Blank single:19197::"Alert and oriented x 3. Exaggerated  physical and/or psychosocial pain behavior perceived.","Alert, oriented x 3 (person, place, & time)"} {Blank single:19197::"Mr. Dabney's speech pattern and demeanor seems to suggest oversedation","     "} Eyes: {Blank single:19197::"Miotic (pupilary constriction) due to opiate use","Midriatic","Anisocoric","Evidence of ptosis","Pin-point pupils","PERLA"} Respiratory: {Blank single:19197::"Oxygen-dependent COPD","No evidence of acute respiratory distress"}  Cervical Spine Exam  Inspection: {Blank single:19197::"Well healed scar from previous spine surgery detected","Paravertebral muscle atrophy","No masses, redness, or swelling"} Alignment: {Blank single:19197::"Asymmetric","Symmetrical"} Functional ROM: {Blank single:19197::"Improved after treatment","Adequate ROM","Decreased ROM","Diminished ROM","Full ROM","Fused","Grossly intact ROM","Guarding","Limited ROM","Mechanically"restricted ROM","Minimal ROM","Pain"restricted ROM","Restricted ROM","Zero ROM","ROM appears unrestricted","ROM is within functional limits (WFL)","ROM is within normal limits (WNL)","Unrestricted ROM"} Stability: {Blank single:19197::"Possibly unstable","No instability detected"} Muscle strength & Tone: {Blank single:19197::"Inconsistent level of performance when tested","Functionally intact"} Sensory: {Blank single:19197::"Improved","Movement-associated pain","Movement-associated discomfort","Impaired sensorium","Articular pain pattern","Dermatomal pain pattern","Musculoskeletal pain pattern","Myotome pain pattern","Neurogenic pain pattern","Neuropathic pain pattern","Referred pain pattern","Visceral pain pattern","Allodynia (Painful response to non-painful stimuli)","Anesthesia (Absence of sensation)","Anesthesia Dolorosa (Numbness over painful area)","Dysesthesias (Unpleasant sensation to touch)","Hyperalgesia (Increased sensitivity to pain)","Hyperesthesia (Increased sensitivity to touch)","Hyperpathia (Painful, exaggerated  response to nociceptive stimuli)","Hypoalgesia (Decreased sensitivity to painful stimuli)","Hypoesthesia/Hypesthesia (Reduced sensation to touch)","Paresthesia (Burning sensation)","Paresthesia (Tingling sensation)","WNL","No anomaly detected","Unimpaired"} Palpation: {Blank single:19197::"Tender","Complains of area being tender to palpation","Positive","Negative","Increased muscle tone","Trigger Point","Muscular Atrophy","Non-tender","No complaints of tenderness","Non-contributory"}  Upper Extremity (UE) Exam  Side: Right upper extremity  Side: Left upper extremity  Inspection: {Blank single:19197::"Below elbow amputation (BEA)","Above elbow amputation (AEA)","Contracture","Atrophy","Dystrophy","Heberden's nodes (DIP)","Bouchard's nodes (PIP)","No gross anomalies detected","Edema","Positive color changes","Some redness observed","Increased temperature","Acrocyanosis","Normal skin color, temperature, and hair growth. No peripheral edema or cyanosis","No masses, redness, swelling, or asymmetry. No contractures"}  Inspection: {Blank single:19197::"Below elbow amputation (BEA)","Above elbow amputation (AEA)","Contracture","Atrophy","Dystrophy","Heberden's nodes (DIP)","Bouchard's nodes (PIP)","No gross anomalies detected","Edema","Positive color changes","Some redness observed","Increased temperature","Acrocyanosis","Normal skin color, temperature, and hair growth. No peripheral edema or cyanosis","No masses, redness, swelling, or asymmetry. No contractures"}  Functional ROM: {Blank single:19197::"Improved after treatment","Impaired ROM","Adequate ROM","Decreased ROM","Diminished ROM","Full ROM","Fused","Grossly intact ROM","Guarding","Limited ROM","Mechanically"restricted ROM","Minimal ROM","Pain"restricted ROM","Restricted ROM","Zero ROM","ROM appears unrestricted","ROM is within functional limits (WFL)","ROM is within normal limits (WNL)","Unrestricted ROM"} {Blank single:19197::"for shoulder","for  elbow","for shoulder and elbow","for wrist","for wrist and hand","for hand","       "}  Functional ROM: {Blank single:19197::"Improved after treatment","Impaired ROM","Adequate ROM","Decreased ROM","Diminished ROM","Full ROM","Fused","Grossly intact ROM","Guarding","Limited ROM","Mechanically"restricted ROM","Minimal ROM","Pain"restricted ROM","Restricted ROM","Zero ROM","ROM appears unrestricted","ROM is within functional limits (WFL)","ROM is within normal limits (WNL)","Unrestricted ROM"} {Blank single:19197::"for shoulder","for elbow","for shoulder and elbow","for wrist","for wrist and hand","for hand","       "}  Muscle strength & Tone: {Blank single:19197::"Inconsistent level of performance when tested","Normal strength (5/5)","Movement possible against some resistance (4/5)","Movement possible against gravity, but not against resistance (3/5)","Movement possible, but not against gravity (2/5)","Muscle flickering, but no movement (1/5)","No motor contraction (0/5)","Flaccid paralysis","Spastic paralysis","Cogwheel rigidity","Clasp-knife rigidity","Give-away weakness","Deconditioned","Mild-to-medarate deconditioning","Moderate-to-severe deconditioning","Guarding","WNL","Unremarkable","Grossly normal","Grossly intact","Functionally intact"}  Muscle strength & Tone: {Blank single:19197::"Inconsistent level of performance when tested","Normal strength (5/5)","Movement possible against some resistance (4/5)","Movement possible against gravity, but not against resistance (3/5)","Movement possible, but not against gravity (2/5)","Muscle flickering, but no movement (1/5)","No motor contraction (0/5)","Flaccid paralysis","Spastic paralysis","Cogwheel rigidity","Clasp-knife rigidity","Give-away weakness","Deconditioned","Mild-to-medarate deconditioning","Moderate-to-severe deconditioning","Guarding","WNL","Unremarkable","Grossly normal","Grossly intact","Functionally intact"}  Sensory: {Blank  single:19197::"Improved","Movement-associated pain","Movement-associated discomfort","Impaired sensorium","Articular pain pattern","Dermatomal pain pattern","Non-dermatomal pain pattern","Musculoskeletal pain pattern","Myotome pain pattern","Neurogenic pain pattern","Neuropathic pain pattern","Referred pain pattern","Visceral pain pattern","Allodynia (Painful response to non-painful stimuli)","Anesthesia (Absence of sensation)","Anesthesia Dolorosa (Numbness over painful area)","Dysesthesias (Unpleasant sensation to touch)","Hyperalgesia (Increased sensitivity to pain)","Hyperesthesia (Increased sensitivity to touch)","Hyperpathia (Painful, exaggerated response to nociceptive stimuli)","Hypoalgesia (Decreased sensitivity to painful stimuli)","Hypoesthesia/Hypesthesia (Reduced sensation to touch)","Paresthesia (Burning sensation)","Paresthesia (Tingling sensation)","WNL","No anomaly detected","Unimpaired"}  Sensory: {Blank single:19197::"Improved","Movement-associated pain","Movement-associated discomfort","Impaired sensorium","Articular pain pattern","Dermatomal pain pattern","Non-dermatomal pain pattern","Musculoskeletal pain pattern","Myotome pain pattern","Neurogenic pain pattern","Neuropathic pain pattern","Referred pain pattern","Visceral pain pattern","Allodynia (Painful response to non-painful stimuli)","Anesthesia (Absence of sensation)","Anesthesia Dolorosa (Numbness over painful area)","Dysesthesias (Unpleasant sensation to touch)","Hyperalgesia (Increased sensitivity to pain)","Hyperesthesia (Increased sensitivity to touch)","Hyperpathia (Painful, exaggerated response to nociceptive stimuli)","Hypoalgesia (Decreased sensitivity to painful stimuli)","Hypoesthesia/Hypesthesia (Reduced sensation to touch)","Paresthesia (Burning sensation)","Paresthesia (Tingling sensation)","WNL","No anomaly detected","Unimpaired"}  Palpation: {Blank single:19197::"Tender","Complains of area being tender to  palpation","Positive","Negative","Increased muscle tone","Trigger Point","Muscular Atrophy","Non-tender","No complaints of tenderness","Hyperthermic","Hypothermic","Euthermic"}  Palpation: {Blank single:19197::"Tender","Complains of area being tender to palpation","Positive","Negative","Increased muscle tone","Trigger Point","Muscular Atrophy","Non-tender","No complaints of tenderness","Hyperthermic","Hypothermic","Euthermic"}  Specialized Test(s): {Blank single:19197::"Tinel's","Phalen's","Tinel's/Phalen's","Deferred"} {Blank single:19197::"(+)","(-)","(+)/(-)","(-)/(+)","      "}  Specialized Test(s): {Blank single:19197::"Tinel's","Phalen's","Tinel's/Phalen's","Deferred"} {Blank single:19197::"(+)","(-)","(+)/(-)","(-)/(+)","      "}   Thoracic Spine Exam  Inspection: {Blank single:19197::"Well healed scar from previous spine surgery detected","increased thoracic Kyphosis","Significant thoracic kyphosis","Paravertebral muscle atrophy","No masses, redness, or swelling"} Alignment: {Blank single:19197::"Asymmetric","Symmetrical"} Functional ROM: {Blank single:19197::"Improved after treatment","Adequate ROM","Decreased ROM","Diminished ROM","Full ROM","Fused","Grossly intact ROM","Guarding","Limited ROM","Mechanically"restricted ROM","Minimal ROM","Pain"restricted ROM","Restricted ROM","Zero ROM","ROM appears unrestricted","ROM is within functional limits (WFL)","ROM is within normal limits (WNL)","Unrestricted ROM"} Stability: {Blank single:19197::"Possibly unstable","No instability detected"} Sensory: {Blank single:19197::"Improved","Movement-associated pain","Movement-associated discomfort","Impaired sensorium","Articular pain pattern","Dermatomal pain pattern","Musculoskeletal pain pattern","Myotome pain pattern","Neurogenic pain pattern","Neuropathic pain pattern","Referred pain pattern","Visceral pain pattern","Allodynia (Painful response to non-painful stimuli)","Anesthesia (Absence of  sensation)","Anesthesia Dolorosa (Numbness over  painful area)","Dysesthesias (Unpleasant sensation to touch)","Hyperalgesia (Increased sensitivity to pain)","Hyperesthesia (Increased sensitivity to touch)","Hyperpathia (Painful, exaggerated response to nociceptive stimuli)","Hypoalgesia (Decreased sensitivity to painful stimuli)","Hypoesthesia/Hypesthesia (Reduced sensation to touch)","Paresthesia (Burning sensation)","Paresthesia (Tingling sensation)","WNL","No anomaly detected","Unimpaired"} Muscle strength & Tone: {Blank single:19197::"Inconsistent level of performance when tested","Functionally intact"} Palpation: {Blank single:19197::"Tender","Complains of area being tender to palpation","Positive","Negative","Increased muscle tone","Trigger Point","Muscular Atrophy","Non-tender","No complaints of tenderness","Non-contributory"}  Lumbar Spine Exam  Inspection: {Blank single:19197::"Well healed scar from previous spine surgery detected","Thoraco-lumbar Scoliosis","Lumbar Scoliosis","Paravertebral muscle atrophy","No masses, redness, or swelling"} Alignment: {Blank single:19197::"Scoliosis detected","Levoscoliosis","Dextroscoliosis","Asymmetric","Symmetrical"} Functional ROM: {Blank single:19197::"Improved after treatment","Adequate ROM","Decreased ROM","Diminished ROM","Full ROM","Fused","Grossly intact ROM","Guarding","Limited ROM","Mechanically"restricted ROM","Minimal ROM","Pain"restricted ROM","Restricted ROM","Zero ROM","ROM appears unrestricted","ROM is within functional limits (WFL)","ROM is within normal limits (WNL)","Unrestricted ROM"} Stability: {Blank single:19197::"Possibly unstable","No instability detected"} Muscle strength & Tone: {Blank single:19197::"Increased muscle tone over affected area","Inconsistent level of performance when tested","Functionally intact"} Sensory: {Blank single:19197::"Improved","Movement-associated pain","Movement-associated discomfort","Impaired  sensorium","Articular pain pattern","Dermatomal pain pattern","Musculoskeletal pain pattern","Myotome pain pattern","Neurogenic pain pattern","Neuropathic pain pattern","Referred pain pattern","Visceral pain pattern","Allodynia (Painful response to non-painful stimuli)","Anesthesia (Absence of sensation)","Anesthesia Dolorosa (Numbness over painful area)","Dysesthesias (Unpleasant sensation to touch)","Hyperalgesia (Increased sensitivity to pain)","Hyperesthesia (Increased sensitivity to touch)","Hyperpathia (Painful, exaggerated response to nociceptive stimuli)","Hypoalgesia (Decreased sensitivity to painful stimuli)","Hypoesthesia/Hypesthesia (Reduced sensation to touch)","Paresthesia (Burning sensation)","Paresthesia (Tingling sensation)","WNL","No anomaly detected","Unimpaired"} Palpation: {Blank single:19197::"Tender","Complains of area being tender to palpation","Positive","Negative","Increased muscle tone","Trigger Point","Muscular Atrophy","Non-tender","No complaints of tenderness","Non-contributory"} Provocative Tests: Lumbar Hyperextension and rotation test: {Blank single:19197::"Positive","Negative","Equivocal","Improved after treatment","Unable to perform","Non-contributory","improved","worsened","no change from prior assessment","evaluation deferred today"} {Blank single:19197::"bilaterally for facet joint pain.","on the right for facet joint pain.","on the left for facet joint pain.","due to pain.","due to fusion restriction.","     "} Patrick's Maneuver: {Blank single:19197::"Positive","Negative","Equivocal","Improved after treatment","Unable to perform","Non-contributory","improved","worsened","no change from prior assessment","evaluation deferred today"} {Blank single:19197::"for bilateral S-I joint pain","for right-sided S-I joint pain","for left-sided S-I joint pain","  "} {Blank single:19197::"and","  "} {Blank single:19197::"for bilateral hip joint pain.","for right hip joint pain.","for  left hip joint pain.","due to pain.","due to fusion restriction.","     "}  Gait & Posture Assessment  Ambulation: {Blank single:19197::"Limited","Patient ambulates using a cane","Patient ambulates using crutches","Patient ambulates using a walker","Patient ambulates using a wheel chair","Patient came in today in a wheel chair","Incapable of ambulation without assistance","Nonfunctional","Dependent, Level II (constant assistance required)","Dependent, Level I (intermittent assistance required)","Dependent, Supervision required","Independent, Level surfaces only","Independent, Level and Non-level surfaces","Unassisted"} Gait: {Blank single:19197::"Age-related, senile gait pattern","Antalgic","Limited. Using assistive device to ambulate","Very limited, using assistive device to ambulate","Antalgic gait (limping)","Apraxia (Inability to execute a movement, upon request, without loss of motor or sensory function)","Ataxia (Poor voluntary coordination)","Ataxia (Appendicular)","Ataxia (Cerebellar)(Irregular, uncoordinated movement with inability to balance on one leg or perform tandem gait test)","Ataxia (Friedreich's)","Ataxia (Sensory) (Unsteady "stomping" gait with heavy heel strikes. Postural instability worsened by closing eyes.)","Ataxia (Truncal)("Drunken sailor" gait)","Ataxia (Vestibular)","Awkward","Cautious","Charlie-Chaplin (due to tibial torsion)","Choreiform (Irregular, jerky, involuntary movements)(Hyperkinetic)","Circumduction gait (due to hemiplegia)","Clumsy","Compensatory","Dystaxia (Mild degree of ataxia)","Dystonic","Frontal gait (Apraxia)","Hemiataxia (Ataxia limited to one side)","Hemiparetic (Post-stroke)(Ipsilateral arm flexion and tiptoe/lateral foot walk)","High stepping gait (foot drop)","Modified gait pattern (slower gait speed, wider stride width, and longer stance duration) associated with morbid obesity","Paraparetic (Stiffness, extension, adduction and scissoring of both  legs)","Parkinsonian","Psychogenic","Scissor gait (cerebral palsy)","Shuffling gait","Staggering","Steppage","Stiff hip gait (hip ankylosis)","Stumbling","Trendelenburg (unstable hip)","Unaffected","Uneven","Waddling (Hip pathology)","Functionally WNL","Grossly intact","Improved after treatment","Relatively normal for age and body habitus"} Posture: {Blank single:19197::"Antalgic","Difficulty standing up straight, due to pain","Positive Romberg's test (Sensory Ataxia)(Worsening of balance and pointing with eyes closed)","Painful","Recombent","Relaxed","Tense","Difficulty with positional changes","Sway back","Lumbar lordosis","Thoracic kyphosis","Kyphosis-lordosis","Flat back","Forward head","Neutral Spine","Slouching","Drooping","Rigid","Poor","Good","WNL"}   Lower Extremity Exam    Side: Right lower extremity  Side: Left lower extremity  Inspection: {Blank single:19197::"Below knee amputation (BKA)","Above knee amputation (AKA)","Contracture","Atrophy","Dystrophy","No gross anomalies  detected","Edema","Pitting edema","Venous stasis edema","Positive color changes","Some redness observed","Increased temperature","Acrocyanosis","Normal skin color, temperature, and hair growth. No peripheral edema or cyanosis","No masses, redness, swelling, or asymmetry. No contractures"}  Inspection: {Blank single:19197::"Below knee amputation (BKA)","Above knee amputation (AKA)","Contracture","Atrophy","Dystrophy","No gross anomalies detected","Edema","Pitting edema","Venous stasis edema","Positive color changes","Some redness observed","Increased temperature","Acrocyanosis","Normal skin color, temperature, and hair growth. No peripheral edema or cyanosis","No masses, redness, swelling, or asymmetry. No contractures"}  Functional ROM: {Blank single:19197::"Improved after treatment","Impaired ROM","Adequate ROM","Decreased ROM","Diminished ROM","Full ROM","Fused","Grossly intact ROM","Guarding","Limited  ROM","Mechanically"restricted ROM","Minimal ROM","Pain"restricted ROM","Restricted ROM","Zero ROM","ROM appears unrestricted","ROM is within functional limits (WFL)","ROM is within normal limits (WNL)","Unrestricted ROM"} {Blank single:19197::"for hip joint","for knee joint","for hip and knee joints","       "}  Functional ROM: {Blank single:19197::"Improved after treatment","Impaired ROM","Adequate ROM","Decreased ROM","Diminished ROM","Full ROM","Fused","Grossly intact ROM","Guarding","Limited ROM","Mechanically"restricted ROM","Minimal ROM","Pain"restricted ROM","Restricted ROM","Zero ROM","ROM appears unrestricted","ROM is within functional limits (WFL)","ROM is within normal limits (WNL)","Unrestricted ROM"} {Blank single:19197::"for hip joint","for knee joint","for hip and knee joints","       "}  Muscle strength & Tone: {Blank single:19197::"Able to Toe-walk & Heel-walk without problems","Give-away weakness","Deconditioned","Mild-to-moderate deconditioning","Moderate-to-severe deconditioning","Guarding","Inconsistent level of performance when tested","Normal strength (5/5)","Movement possible against some resistance (4/5)","Movement possible against gravity, but not against resistance (3/5)","Movement possible, but not against gravity (2/5)","Muscle flickering, but no movement (1/5)","No motor contraction (0/5)","Flaccid paralysis","Spastic paralysis","Cogwheel rigidity","Clasp-knife rigidity","WNL","Unremarkable","Grossly normal","Grossly intact","Functionally intact"}  Muscle strength & Tone: {Blank single:19197::"Able to Toe-walk & Heel-walk without problems","Give-away weakness","Deconditioned","Mild-to-moderate deconditioning","Moderate-to-severe deconditioning","Guarding","Inconsistent level of performance when tested","Normal strength (5/5)","Movement possible against some resistance (4/5)","Movement possible against gravity, but not against resistance (3/5)","Movement possible, but not against  gravity (2/5)","Muscle flickering, but no movement (1/5)","No motor contraction (0/5)","Flaccid paralysis","Spastic paralysis","Cogwheel rigidity","Clasp-knife rigidity","WNL","Unremarkable","Grossly normal","Grossly intact","Functionally intact"}  Sensory: {Blank single:19197::"Improved","Movement-associated pain","Movement-associated discomfort","Impaired sensorium","Articular pain pattern","Dermatomal pain pattern","Non-dermatomal pain pattern","Musculoskeletal pain pattern","Myotome pain pattern","Neurogenic pain pattern","Neuropathic pain pattern","Referred pain pattern","Visceral pain pattern","Allodynia (Painful response to non-painful stimuli)","Anesthesia (Absence of sensation)","Anesthesia Dolorosa (Numbness over painful area)","Dysesthesias (Unpleasant sensation to touch)","Hyperalgesia (Increased sensitivity to pain)","Hyperesthesia (Increased sensitivity to touch)","Hyperpathia (Painful, exaggerated response to nociceptive stimuli)","Hypoalgesia (Decreased sensitivity to painful stimuli)","Hypoesthesia/Hypesthesia (Reduced sensation to touch)","Paresthesia (Burning sensation)","Paresthesia (Tingling sensation)","WNL","No anomaly detected","Unimpaired"}  Sensory: {Blank single:19197::"Improved","Movement-associated pain","Movement-associated discomfort","Impaired sensorium","Articular pain pattern","Dermatomal pain pattern","Non-dermatomal pain pattern","Musculoskeletal pain pattern","Myotome pain pattern","Neurogenic pain pattern","Neuropathic pain pattern","Referred pain pattern","Visceral pain pattern","Allodynia (Painful response to non-painful stimuli)","Anesthesia (Absence of sensation)","Anesthesia Dolorosa (Numbness over painful area)","Dysesthesias (Unpleasant sensation to touch)","Hyperalgesia (Increased sensitivity to pain)","Hyperesthesia (Increased sensitivity to touch)","Hyperpathia (Painful, exaggerated response to nociceptive stimuli)","Hypoalgesia (Decreased sensitivity to painful  stimuli)","Hypoesthesia/Hypesthesia (Reduced sensation to touch)","Paresthesia (Burning sensation)","Paresthesia (Tingling sensation)","WNL","No anomaly detected","Unimpaired"}  Palpation: {Blank single:19197::"Tender","Complains of area being tender to palpation","Positive","Negative","Increased muscle tone","Trigger Point","Muscular Atrophy","Non-tender","No complaints of tenderness","Hyperthermic","Hypothermic","Euthermic","No palpable anomalies"}  Palpation: {Blank single:19197::"Tender","Complains of area being tender to palpation","Positive","Negative","Increased muscle tone","Trigger Point","Muscular Atrophy","Non-tender","No complaints of tenderness","Hyperthermic","Hypothermic","Euthermic","No palpable anomalies"}   Assessment  Primary Diagnosis & Pertinent Problem List: The primary encounter diagnosis was Chronic low back pain (Location of Primary Source of Pain) (Bilateral) (L>R). Diagnoses of Chronic lower extremity pain (Location of Secondary source of pain) (Right), Chronic lumbar radicular pain (Left) (L5 Dermatome), Lumbar facet syndrome (Location of Primary Source of Pain) (Bilateral) (R>L), and Lumbar radicular pain (Right) (L5 Dermatome) were also pertinent to this visit.  Status Diagnosis  {Problem Stability:19197::"Unimproved","Improving","Not improving","Responding","Not responding","Worsening","Controlled"} {Problem Stability:19197::"Unimproved","Improving","Not improving","Responding","Not responding","Worsening","Controlled"} {Problem Stability:19197::"Unimproved","Improving","Not improving","Responding","Not responding","Worsening","Controlled"} 1. Chronic low back pain (Location of Primary Source of Pain) (Bilateral) (L>R)   2. Chronic lower extremity pain (Location of Secondary source of pain) (Right)   3. Chronic lumbar radicular pain (Left) (L5 Dermatome)   4. Lumbar facet syndrome (Location of Primary Source of Pain) (Bilateral) (  R>L)   5. Lumbar radicular pain (Right)  (L5 Dermatome)      Plan of Care  Pharmacotherapy (Medications Ordered): No orders of the defined types were placed in this encounter.  New Prescriptions   No medications on file   Medications administered today: Mr. Bargo had no medications administered during this visit. Lab-work, procedure(s), and/or referral(s): No orders of the defined types were placed in this encounter.  Imaging and/or referral(s): None  Interventional therapies: Planned, scheduled, and/or pending:   {Blank single:19197::"Not indicated","Medically contraindicated","On anticoagulants","To be determined at a later time","Not at this time."}   Considering:   ***   Palliative PRN treatment(s):   ***   Provider-requested follow-up: No Follow-up on file.  Future Appointments Date Time Provider Department Center  11/14/2016 1:45 PM Delano Metz, MD [facility] None  12/20/2016 9:45 AM Delano Metz, MD [facility] None   Primary Care Physician: Katharine Look, MD Location: [facility] Note by: Sydnee Levans. Laban Emperor, M.D, DABA, DABAPM, DABPM, DABIPP, FIPP Date: 11/14/2016; Time: 8:01 AM  Pain Score Disclaimer: We use the NRS-11 scale. This is a self-reported, subjective measurement of pain severity with only modest accuracy. It is used primarily to identify changes within a particular patient. It must be understood that outpatient pain scales are significantly less accurate that those used for research, where they can be applied under ideal controlled circumstances with minimal exposure to variables. In reality, the score is likely to be a combination of pain intensity and pain affect, where pain affect describes the degree of emotional arousal or changes in action readiness caused by the sensory experience of pain. Factors such as social and work situation, setting, emotional state, anxiety levels, expectation, and prior pain experience may influence pain perception and show  large inter-individual differences that may also be affected by time variables.  Patient instructions provided during this appointment: There are no Patient Instructions on file for this visit.

## 2016-11-29 ENCOUNTER — Encounter: Payer: Self-pay | Admitting: Pain Medicine

## 2016-11-29 ENCOUNTER — Encounter (INDEPENDENT_AMBULATORY_CARE_PROVIDER_SITE_OTHER): Payer: Self-pay

## 2016-11-29 ENCOUNTER — Ambulatory Visit: Payer: Medicare Other | Attending: Pain Medicine | Admitting: Pain Medicine

## 2016-11-29 VITALS — BP 145/86 | HR 80 | Temp 97.7°F | Resp 16 | Ht 68.0 in | Wt 172.0 lb

## 2016-11-29 DIAGNOSIS — M25561 Pain in right knee: Secondary | ICD-10-CM

## 2016-11-29 DIAGNOSIS — M79604 Pain in right leg: Secondary | ICD-10-CM | POA: Diagnosis not present

## 2016-11-29 DIAGNOSIS — M549 Dorsalgia, unspecified: Secondary | ICD-10-CM | POA: Diagnosis not present

## 2016-11-29 DIAGNOSIS — E119 Type 2 diabetes mellitus without complications: Secondary | ICD-10-CM | POA: Insufficient documentation

## 2016-11-29 DIAGNOSIS — Z79899 Other long term (current) drug therapy: Secondary | ICD-10-CM | POA: Diagnosis not present

## 2016-11-29 DIAGNOSIS — Z794 Long term (current) use of insulin: Secondary | ICD-10-CM | POA: Insufficient documentation

## 2016-11-29 DIAGNOSIS — M792 Neuralgia and neuritis, unspecified: Secondary | ICD-10-CM

## 2016-11-29 DIAGNOSIS — E785 Hyperlipidemia, unspecified: Secondary | ICD-10-CM | POA: Insufficient documentation

## 2016-11-29 DIAGNOSIS — M1288 Other specific arthropathies, not elsewhere classified, other specified site: Secondary | ICD-10-CM | POA: Diagnosis not present

## 2016-11-29 DIAGNOSIS — G8929 Other chronic pain: Secondary | ICD-10-CM

## 2016-11-29 DIAGNOSIS — K5900 Constipation, unspecified: Secondary | ICD-10-CM | POA: Insufficient documentation

## 2016-11-29 DIAGNOSIS — F419 Anxiety disorder, unspecified: Secondary | ICD-10-CM | POA: Insufficient documentation

## 2016-11-29 DIAGNOSIS — G894 Chronic pain syndrome: Secondary | ICD-10-CM | POA: Diagnosis present

## 2016-11-29 DIAGNOSIS — D869 Sarcoidosis, unspecified: Secondary | ICD-10-CM | POA: Diagnosis not present

## 2016-11-29 DIAGNOSIS — M5441 Lumbago with sciatica, right side: Secondary | ICD-10-CM | POA: Diagnosis not present

## 2016-11-29 DIAGNOSIS — Z7952 Long term (current) use of systemic steroids: Secondary | ICD-10-CM | POA: Diagnosis not present

## 2016-11-29 DIAGNOSIS — M25562 Pain in left knee: Secondary | ICD-10-CM

## 2016-11-29 DIAGNOSIS — E559 Vitamin D deficiency, unspecified: Secondary | ICD-10-CM | POA: Diagnosis not present

## 2016-11-29 DIAGNOSIS — Z9889 Other specified postprocedural states: Secondary | ICD-10-CM | POA: Diagnosis not present

## 2016-11-29 DIAGNOSIS — M5416 Radiculopathy, lumbar region: Secondary | ICD-10-CM

## 2016-11-29 DIAGNOSIS — I10 Essential (primary) hypertension: Secondary | ICD-10-CM | POA: Insufficient documentation

## 2016-11-29 DIAGNOSIS — F329 Major depressive disorder, single episode, unspecified: Secondary | ICD-10-CM | POA: Insufficient documentation

## 2016-11-29 DIAGNOSIS — G510 Bell's palsy: Secondary | ICD-10-CM | POA: Insufficient documentation

## 2016-11-29 DIAGNOSIS — M5116 Intervertebral disc disorders with radiculopathy, lumbar region: Secondary | ICD-10-CM | POA: Insufficient documentation

## 2016-11-29 DIAGNOSIS — M47816 Spondylosis without myelopathy or radiculopathy, lumbar region: Secondary | ICD-10-CM

## 2016-11-29 DIAGNOSIS — F1190 Opioid use, unspecified, uncomplicated: Secondary | ICD-10-CM

## 2016-11-29 MED ORDER — GABAPENTIN 600 MG PO TABS: 600.0000 mg | ORAL_TABLET | Freq: Three times a day (TID) | ORAL | 0 refills | Status: DC

## 2016-11-29 MED ORDER — OXYCODONE-ACETAMINOPHEN 10-325 MG PO TABS: 1.0000 | ORAL_TABLET | Freq: Four times a day (QID) | ORAL | 0 refills | Status: DC | PRN

## 2016-11-29 NOTE — Patient Instructions (Signed)
You have been given 3 prescriptions for oxycodone and you have been escribed gabapentin this visit.

## 2016-11-29 NOTE — Progress Notes (Signed)
Patient's Name: Douglas Andrews  MRN: [id number]  Referring Provider: Fritzi Mandes, MD  DOB: 1951/07/31  PCP: Fritzi Mandes, MD  DOS: 11/29/2016  Note by: Kathlen Brunswick. Dossie Arbour, MD  Service setting: Ambulatory outpatient  Specialty: Interventional Pain Management  Location: [facility] (AMB) Pain Management Facility    Patient type: Established   Primary Reason(s) for Visit: Encounter for prescription drug management & post-procedure evaluation of chronic illness with mild to moderate exacerbation(Level of risk: moderate) CC: Back Pain (lower)  HPI  [name] is a 66 y.o. year old, male patient, who comes today for a post-procedure evaluation and medication management. He has Chronic low back pain (Location of Primary Source of Pain) (Bilateral) (L>R); Chronic pain syndrome;  Lumbar DDD (degenerative disc disease);  Lumbar Spondylosis; Lumbar radicular pain (Right) (L5 Dermatome); Lumbar facet syndrome (Location of Primary Source of Pain) (Bilateral) (R>L); Opiate use (60 MME/Day); Uncomplicated opioid dependence ([place]); Long term prescription opiate use; Chronic knee pain (Location of Tertiary source of pain) (Bilateral) (Right); Vitamin D insufficiency; Overweight; Bell palsy; CN (constipation); Dermatitis, eczematoid; Diabetes mellitus ([place]); Dysmetria; Essential (primary) hypertension; Anxiety, generalized; Insomnia due to medical condition; Long term current use of systemic steroids; Type 2 diabetes mellitus ([place]); Pulmonary sarcoidosis ([place]); Arthropathy, traumatic, shoulder; Bursitis, trochanteric; Esophageal varices ([place]); Leg varices; Hepatic cirrhosis ([place]); Major depressive disorder with single episode; Other long term (current) drug therapy; At risk for falling; Encounter for chronic pain management; Encounter for therapeutic drug level monitoring; Chronic lower extremity pain (Location of Secondary source of pain) (Right); Chronic lumbar radicular pain (Left) (L5 Dermatome); Lumbar Levoscoliosis;  Neurogenic pain; Osteoarthritis of knee (Right); At high risk for falls; Chest pain; Skin lesion; Pain of left femur (left femoral rod due to sarcoidosis); Chronic hip pain (Right); Osteoarthritis of knee (Bilateral) (R>L); Essential hypertension; Folliculitis; Chronic left sacroiliac joint pain; Chronic hip pain, left; and Chronic left-sided thoracic back pain on his problem list. His primarily concern today is the Back Pain (lower)  Pain Assessment: Self-Reported Pain Score: 0-No pain/10             Reported level is compatible with observation.       Pain Type: Chronic pain Pain Location: Back Pain Orientation: Lower Pain Descriptors / Indicators: Aching, Constant Pain Frequency: Constant  Douglas Andrews was last seen on 64/12/2016 for a procedure. During today's appointment we reviewed Douglas Andrews post-procedure results, as well as his outpatient medication regimen. The patient is currently being evaluated for the possibility of colon cancer.  Further details on both, my assessment(s), as well as the proposed treatment plan, please see below.  Controlled Substance Pharmacotherapy Assessment REMS (Risk Evaluation and Mitigation Strategy)  Analgesic:Oxycodone/APAP 10/325 one every 6 hours (40 mg/day) MME/day:60 mg/day Evon Slack, RN  11/29/2016  8:12 AM  Sign at close encounter Nursing Pain Medication Assessment:  Safety precautions to be maintained throughout the outpatient stay will include: orient to surroundings, keep bed in low position, maintain call bell within reach at all times, provide assistance with transfer out of bed and ambulation.  Medication Inspection Compliance: Pill count conducted under aseptic conditions, in front of the patient. Neither the pills nor the bottle was removed from the patient's sight at any time. Once count was completed pills were immediately returned to the patient in their original bottle.  Medication: Oxycodone IR Pill/Patch Count: 104 of 120 pills  remain Bottle Appearance: Standard pharmacy container. Clearly labeled. Filled Date: 02 /17 / 2018 Last Medication intake:  Douglas Andrews  Pharmacokinetics: Liberation and absorption (onset of action): WNL Distribution (time to peak effect): WNL Metabolism and excretion (duration of action): WNL         Pharmacodynamics: Desired effects: Analgesia: [name] reports >50% benefit. Functional ability: Patient reports that medication allows him to accomplish basic ADLs Clinically meaningful improvement in function (CMIF): Sustained CMIF goals met Perceived effectiveness: Described as relatively effective, allowing for increase in activities of daily living (ADL) Undesirable effects: Side-effects or Adverse reactions: None reported Monitoring: [place] PMP: Online review of the past 25-monthperiod conducted. Compliant with practice rules and regulations List of all UDS test(s) done:  Lab Results  Component Value Date   TOXASSSELUR FINAL 03/30/2016   TOXASSSELUR FINAL 11/19/2015   TOXASSSELUR FINAL 10/07/2015   Last UDS on record: ToxAssure Select 13  Date Value Ref Range Status  03/30/2016 FINAL  Final    Comment:    ==================================================================== TOXASSURE SELECT 13 (MW) ==================================================================== Test                             Result       Flag       Units Drug Present and Declared for Prescription Verification   Oxycodone                      578          EXPECTED   ng/mg creat   Oxymorphone                    360          EXPECTED   ng/mg creat   Noroxycodone                   2551         EXPECTED   ng/mg creat   Noroxymorphone                 135          EXPECTED   ng/mg creat    Sources of oxycodone are scheduled prescription medications.    Oxymorphone, noroxycodone, and noroxymorphone are expected    metabolites of oxycodone. Oxymorphone is also available as a    scheduled prescription  medication. Drug Present not Declared for Prescription Verification   7-aminoclonazepam              100          UNEXPECTED ng/mg creat    7-aminoclonazepam is an expected metabolite of clonazepam. Source    of clonazepam is a scheduled prescription medication. ==================================================================== Test                      Result    Flag   Units      Ref Range   Creatinine              83               mg/dL      >=20 ==================================================================== Declared Medications:  The flagging and interpretation on this report are based on the  following declared medications.  Unexpected results may arise from  inaccuracies in the declared medications.  **Note: The testing scope of this panel includes these medications:  Oxycodone (Oxycodone Acetaminophen)  **Note: The testing scope of this panel does not include following  reported medications:  Acetaminophen (Oxycodone Acetaminophen)  Albuterol  Aspirin  Atorvastatin  Cholecalciferol  Diphenhydramine  Gabapentin  Hydroxyzine  Ibuprofen  Insulin (Humalog)  Insulin (Lantus)  Isosorbide Mononitrate  Lisinopril  Metformin  Nitroglycerin  Omeprazole  Prednisone  Sertraline ==================================================================== For clinical consultation, please call [phone]. ====================================================================    UDS interpretation: Compliant          Medication Assessment Form: Reviewed. Patient indicates being compliant with therapy Treatment compliance: Compliant Risk Assessment Profile: Aberrant behavior: See prior evaluations. None observed or detected today Comorbid factors increasing risk of overdose: See prior notes. No additional risks detected today Risk of substance use disorder (SUD): Low Opioid Risk Tool (ORT) Total Score: 0  Interpretation Table:  Score <3 = Low Risk for SUD  Score between 4-7  = Moderate Risk for SUD  Score >8 = High Risk for Opioid Abuse   Risk Mitigation Strategies:  Patient Counseling: Covered Patient-Prescriber Agreement (PPA): Present and active  Notification to other healthcare providers: Done  Pharmacologic Plan: No change in therapy, at this time  Post-Procedure Assessment  10/12/2016 Procedure: Diagnostic left L4-5 interlaminar lumbar epidural steroid injection under fluoroscopic guidance, no sedation Post-procedure pain score: 0/10 (100% relief) Influential Factors: BMI: 26.15 kg/m Intra-procedural challenges: None observed Assessment challenges: None detected         Post-procedural side-effects, adverse reactions, or complications: None reported Reported issues: None  Sedation: No sedation used. When no sedatives are used, the analgesic levels obtained are directly associated to the effectiveness of the local anesthetics. However, when sedation is provided, the level of analgesia obtained during the initial 1 hour following the intervention, is believed to be the result of a combination of factors. These factors may include, but are not limited to: 1. The effectiveness of the local anesthetics used. 2. The effects of the analgesic(s) and/or anxiolytic(s) used. 3. The degree of discomfort experienced by the patient at the time of the procedure. 4. The patients ability and reliability in recalling and recording the events. 5. The presence and influence of possible secondary gains and/or psychosocial factors. Reported result: Relief experienced during the 1st hour after the procedure: 75 % (Ultra-Short Term Relief) Interpretative annotation: Analgesia during this period is likely to be Local Anesthetic and/or IV Sedative (Analgesic/Anxiolitic) related.          Effects of local anesthetic: The analgesic effects attained during this period are directly associated to the localized infiltration of local anesthetics and therefore cary significant  diagnostic value as to the etiological location, or anatomical origin, of the pain. Expected duration of relief is directly dependent on the pharmacodynamics of the local anesthetic used. Long-acting (4-6 hours) anesthetics used.  Reported result: Relief during the next 4 to 6 hour after the procedure: 100 % (Short-Term Relief) Interpretative annotation: Complete relief would suggest area to be the source of the pain.          Long-term benefit: Defined as the period of time past the expected duration of local anesthetics. With the possible exception of prolonged sympathetic blockade from the local anesthetics, benefits during this period are typically attributed to, or associated with, other factors such as analgesic sensory neuropraxia, antiinflammatory effects, or beneficial biochemical changes provided by agents other than the local anesthetics Reported result: Extended relief following procedure: 80 % (Long-Term Relief) Interpretative annotation: Good relief. This could suggest inflammation to be a significant component in the etiology to the pain.          Current benefits: Defined as persistent relief that continues at this point in time.   Reported  results: Treated area: 100 % Mr. Heritage reports improvement in function Interpretative annotation: Ongoing benefits would suggest effective therapeutic approach  Interpretation: Results would suggest a successful diagnostic intervention.          Laboratory Chemistry  Inflammation Markers Lab Results  Component Value Date   ESRSEDRATE 1 03/30/2016   CRP <0.5 03/30/2016   Renal Function Lab Results  Component Value Date   BUN 12 03/30/2016   CREATININE 0.85 03/30/2016   GFRAA >60 03/30/2016   GFRNONAA >60 03/30/2016   Hepatic Function Lab Results  Component Value Date   AST 24 03/30/2016   ALT 27 03/30/2016   ALBUMIN 4.6 03/30/2016   Electrolytes Lab Results  Component Value Date   NA 136 03/30/2016   K 3.8 03/30/2016   CL  100 (L) 03/30/2016   CALCIUM 9.2 03/30/2016   MG 2.2 03/30/2016   Pain Modulating Vitamins Lab Results  Component Value Date   25OHVITD1 38 03/30/2016   25OHVITD2 3.4 03/30/2016   25OHVITD3 35 03/30/2016   VITAMINB12 539 03/30/2016   Coagulation Parameters No results found for: INR, LABPROT, APTT, PLT Cardiovascular No results found for: BNP, HGB, HCT Note: Lab results reviewed.  Recent Diagnostic Imaging Review  Dg C-arm 1-60 Min-no Report  Result Date: 10/12/2016 There is no Radiologist interpretation  for this exam.  Note: Imaging results reviewed.          Meds  The patient has a current medication list which includes the following prescription(s): albuterol, albuterol, atorvastatin, cetirizine, vitamin d3, fluoxetine, gabapentin, glucose blood, hydroxyzine, ibuprofen-diphenhydramine cit, insulin glargine, insulin pen needle, isosorbide mononitrate, lantus solostar, lisinopril, metformin, nitroglycerin, omeprazole, oxycodone-acetaminophen, oxycodone-acetaminophen, oxycodone-acetaminophen, oxycodone-acetaminophen, cvs probiotic, and victoza.  Current Outpatient Prescriptions on File Prior to Visit  Medication Sig  . albuterol (PROAIR HFA) 108 (90 Base) MCG/ACT inhaler INHALE 2 PUFFS BY MOUTH EVERY 4 HOURS ASNEEDED FOR WHEEZING  . albuterol (PROVENTIL) (5 MG/ML) 0.5% nebulizer solution Take 2.5 mg by nebulization every 6 (six) hours as needed for wheezing or shortness of breath.   Marland Kitchen atorvastatin (LIPITOR) 40 MG tablet Take 40 mg by mouth daily.  . cetirizine (ZYRTEC) 10 MG tablet Take 10 mg by mouth daily.   . Cholecalciferol (VITAMIN D3) 5000 units TABS Take 1 tablet by mouth daily.   Marland Kitchen FLUoxetine (PROZAC) 20 MG capsule Take 20 mg by mouth daily.   Marland Kitchen glucose blood (ACCU-CHEK ACTIVE STRIPS) test strip Checks 3 times daily  . hydrOXYzine (VISTARIL) 25 MG capsule Take 50 mg by mouth every 6 (six) hours as needed.   . Ibuprofen-Diphenhydramine Cit (ADVIL PM PO) Take 1 tablet by  mouth at bedtime.  . insulin glargine (LANTUS) 100 UNIT/ML injection Inject 60 Units into the skin at bedtime.   . Insulin Pen Needle (UNIFINE PENTIPS) 31G X 6 MM MISC   . isosorbide mononitrate (IMDUR) 30 MG 24 hr tablet 30 mg daily.   Marland Kitchen LANTUS SOLOSTAR 100 UNIT/ML Solostar Pen Inject 60 Units into the skin daily at 10 pm.   . lisinopril (PRINIVIL,ZESTRIL) 10 MG tablet Take 10 mg by mouth daily.   . metFORMIN (GLUCOPHAGE) 500 MG tablet Take 1,000 mg by mouth 2 (two) times daily with a meal.  . nitroGLYCERIN (NITROSTAT) 0.4 MG SL tablet Place 0.4 mg under the tongue every 5 (five) minutes as needed for chest pain.  Marland Kitchen omeprazole (PRILOSEC) 20 MG capsule Take 20 mg by mouth daily.   No current facility-administered medications on file prior to visit.  ROS  Constitutional: Denies any fever or chills Gastrointestinal: No reported hemesis, hematochezia, vomiting, or acute GI distress Musculoskeletal: Denies any acute onset joint swelling, redness, loss of ROM, or weakness Neurological: No reported episodes of acute onset apraxia, aphasia, dysarthria, agnosia, amnesia, paralysis, loss of coordination, or loss of consciousness  Allergies  Mr. Haji has No Known Allergies.  [place]  Drug: Mr. Cheatum  reports that he does not use drugs. Alcohol:  reports that he does not drink alcohol. Tobacco:  reports that he has never smoked. He has never used smokeless tobacco. Medical:  has a past medical history of Chronic back pain; Diabetes mellitus without complication ([place]); Hyperlipidemia; Hypertension; Left leg pain (01/02/2014); Right leg pain (07/15/2015); and Sarcoidosis ([place]). Family: family history includes Diabetes in his father; Heart disease in his father and mother; Stroke in his father.  Past Surgical History:  Procedure Laterality Date  . APPENDECTOMY    . left rod    . TONSILLECTOMY     Constitutional Exam  General appearance: Well nourished, well developed, and well hydrated. In no  apparent acute distress Vitals:   11/29/16 0754  BP: (!) 145/86  Pulse: 80  Resp: 16  Temp: 97.7 F (36.5 C)  TempSrc: Oral  SpO2: 100%  Weight: 172 lb (78 kg)  Height: _0  (1.727 m)   BMI Assessment: Estimated body mass index is 26.15 kg/m as calculated from the following:   Height as of this encounter: _1  (1.727 m).   Weight as of this encounter: 172 lb (78 kg).  BMI interpretation table: BMI level Category Range association with higher incidence of chronic pain  <18 kg/m2 Underweight   18.5-24.9 kg/m2 Ideal body weight   25-29.9 kg/m2 Overweight Increased incidence by 20%  30-34.9 kg/m2 Obese (Class I) Increased incidence by 68%  35-39.9 kg/m2 Severe obesity (Class II) Increased incidence by 136%  >40 kg/m2 Extreme obesity (Class III) Increased incidence by 254%   BMI Readings from Last 4 Encounters:  11/29/16 26.15 kg/m  10/12/16 27.37 kg/m  09/22/16 27.37 kg/m  09/05/16 27.22 kg/m   Wt Readings from Last 4 Encounters:  11/29/16 172 lb (78 kg)  10/12/16 180 lb (81.6 kg)  09/22/16 180 lb (81.6 kg)  09/05/16 179 lb (81.2 kg)  Psych/Mental status: Alert, oriented x 3 (person, place, & time)       Eyes: PERLA Respiratory: No evidence of acute respiratory distress  Cervical Spine Exam  Inspection: No masses, redness, or swelling Alignment: Symmetrical Functional ROM: Unrestricted ROM Stability: No instability detected Muscle strength & Tone: Functionally intact Sensory: Unimpaired Palpation: Non-contributory  Upper Extremity (UE) Exam    Side: Right upper extremity  Side: Left upper extremity  Inspection: No masses, redness, swelling, or asymmetry. No contractures  Inspection: No masses, redness, swelling, or asymmetry. No contractures  Functional ROM: Unrestricted ROM          Functional ROM: Unrestricted ROM          Muscle strength & Tone: Functionally intact  Muscle strength & Tone: Functionally intact  Sensory: Unimpaired  Sensory: Unimpaired   Palpation: Euthermic  Palpation: Euthermic  Specialized Test(s): Deferred         Specialized Test(s): Deferred          Thoracic Spine Exam  Inspection: No masses, redness, or swelling Alignment: Symmetrical Functional ROM: Unrestricted ROM Stability: No instability detected Sensory: Unimpaired Muscle strength & Tone: Functionally intact Palpation: Non-contributory  Lumbar Spine Exam  Inspection: No masses, redness, or  swelling Alignment: Symmetrical Functional ROM: Unrestricted ROM Stability: No instability detected Muscle strength & Tone: Functionally intact Sensory: Unimpaired Palpation: Non-contributory Provocative Tests: Lumbar Hyperextension and rotation test: evaluation deferred today       Patrick's Maneuver: evaluation deferred today              Gait & Posture Assessment  Ambulation: Unassisted Gait: Relatively normal for age and body habitus Posture: WNL   Lower Extremity Exam    Side: Right lower extremity  Side: Left lower extremity  Inspection: No masses, redness, swelling, or asymmetry. No contractures  Inspection: No masses, redness, swelling, or asymmetry. No contractures  Functional ROM: Unrestricted ROM          Functional ROM: Unrestricted ROM          Muscle strength & Tone: Functionally intact  Muscle strength & Tone: Functionally intact  Sensory: Unimpaired  Sensory: Unimpaired  Palpation: No palpable anomalies  Palpation: No palpable anomalies   Assessment  Primary Diagnosis & Pertinent Problem List: The primary encounter diagnosis was Chronic low back pain (Location of Primary Source of Pain) (Bilateral) (L>R). Diagnoses of Chronic lower extremity pain (Location of Secondary source of pain) (Right), Chronic knee pain (Location of Tertiary source of pain) (Bilateral) (Right), Lumbar facet syndrome (Location of Primary Source of Pain) (Bilateral) (R>L), Chronic lumbar radicular pain (Left) (L5 Dermatome), Lumbar radicular pain (Right) (L5 Dermatome),  Long term current use of systemic steroids, Opiate use (60 MME/Day), Chronic pain syndrome, and Neurogenic pain were also pertinent to this visit.  Status Diagnosis  Controlled Controlled Controlled 1. Chronic low back pain (Location of Primary Source of Pain) (Bilateral) (L>R)   2. Chronic lower extremity pain (Location of Secondary source of pain) (Right)   3. Chronic knee pain (Location of Tertiary source of pain) (Bilateral) (Right)   4. Lumbar facet syndrome (Location of Primary Source of Pain) (Bilateral) (R>L)   5. Chronic lumbar radicular pain (Left) (L5 Dermatome)   6. Lumbar radicular pain (Right) (L5 Dermatome)   7. Long term current use of systemic steroids   8. Opiate use (60 MME/Day)   9. Chronic pain syndrome   10. Neurogenic pain      Plan of Care  Pharmacotherapy (Medications Ordered): Meds ordered this encounter  Medications  . gabapentin (NEURONTIN) 600 MG tablet    Sig: Take 1 tablet (600 mg total) by mouth every 8 (eight) hours.    Dispense:  270 tablet    Refill:  0    Do not place this medication, or any other prescription from our practice, on "Automatic Refill". Patient may have prescription filled one day early if pharmacy is closed on scheduled refill date.  Marland Kitchen oxyCODONE-acetaminophen (PERCOCET) 10-325 MG tablet    Sig: Take 1 tablet by mouth every 6 (six) hours as needed for pain.    Dispense:  120 tablet    Refill:  0    Do not place this medication, or any other prescription from our practice, on "Automatic Refill". Patient may have prescription filled one day early if pharmacy is closed on scheduled refill date. Do not fill until: 02/24/17 To last until: 03/26/17  . oxyCODONE-acetaminophen (PERCOCET) 10-325 MG tablet    Sig: Take 1 tablet by mouth every 6 (six) hours as needed for pain.    Dispense:  120 tablet    Refill:  0    Do not place this medication, or any other prescription from our practice, on "Automatic Refill". Patient may have  prescription filled one day early if pharmacy is closed on scheduled refill date. Do not fill until: 12/26/16 To last until: 01/25/17  . oxyCODONE-acetaminophen (PERCOCET) 10-325 MG tablet    Sig: Take 1 tablet by mouth every 6 (six) hours as needed for pain.    Dispense:  120 tablet    Refill:  0    Do not place this medication, or any other prescription from our practice, on "Automatic Refill". Patient may have prescription filled one day early if pharmacy is closed on scheduled refill date. Do not fill until: 01/25/17 To last until: 02/24/17   New Prescriptions   No medications on file   Medications administered today: Mr. Mcelroy had no medications administered during this visit. Lab-work, procedure(s), and/or referral(s): Orders Placed This Encounter  Procedures  . ToxASSURE Select 13 (MW), Urine   Imaging and/or referral(s): None  Interventional therapies: Planned, scheduled, and/or pending:   Midline L4-5 lumbar epidural steroid injection under fluoroscopic guidance and IV sedation.    Considering:   Diagnostic bilateral Lumbar facet block Possible bilateral lumbar facet radiofrequency ablation Diagnostic right intra-articular hip injection Possible radiofrequency of the right hip joint Diagnostic Right L4-5 lumbar epidural steroid injection Diagnostic bilateral intra-articular knee injection Possible series of 5 Hyalgan knee injections.  Diagnostic bilateral Genicular nerve block Possible bilateral Genicular nerve radiofrequency ablation   Palliative PRN treatment(s):   Diagnostic bilateral Lumbar facet block Diagnostic right intra-articular hip injection Diagnostic Right L4-5 lumbar epidural steroid injection Diagnostic bilateral intra-articular knee injection Possible series of 5 Hyalgan knee injections.  Diagnostic bilateral Genicular nerve block   Provider-requested follow-up: Return in about 3 months (around 02/26/2017) for (Nurse Practitioner) Med-Mgmt, in  addition, (PRN) procedure.  No future appointments. Primary Care Physician: Fritzi Mandes, MD Location: [facility] Note by: Kathlen Brunswick. Dossie Arbour, M.D, DABA, DABAPM, DABPM, DABIPP, FIPP Date: 11/29/2016; Time: 8:47 AM  Pain Score Disclaimer: We use the NRS-11 scale. This is a self-reported, subjective measurement of pain severity with only modest accuracy. It is used primarily to identify changes within a particular patient. It must be understood that outpatient pain scales are significantly less accurate that those used for research, where they can be applied under ideal controlled circumstances with minimal exposure to variables. In reality, the score is likely to be a combination of pain intensity and pain affect, where pain affect describes the degree of emotional arousal or changes in action readiness caused by the sensory experience of pain. Factors such as social and work situation, setting, emotional state, anxiety levels, expectation, and prior pain experience may influence pain perception and show large inter-individual differences that may also be affected by time variables.  Patient instructions provided during this appointment: Patient Instructions  You have been given 3 prescriptions for oxycodone and you have been escribed gabapentin this visit.

## 2016-11-29 NOTE — Progress Notes (Signed)
Nursing Pain Medication Assessment:  Safety precautions to be maintained throughout the outpatient stay will include: orient to surroundings, keep bed in low position, maintain call bell within reach at all times, provide assistance with transfer out of bed and ambulation.  Medication Inspection Compliance: Pill count conducted under aseptic conditions, in front of the patient. Neither the pills nor the bottle was removed from the patient's sight at any time. Once count was completed pills were immediately returned to the patient in their original bottle.  Medication: Oxycodone IR Pill/Patch Count: 104 of 120 pills remain Bottle Appearance: Standard pharmacy container. Clearly labeled. Filled Date: 02 /17 / 2018 Last Medication intake:  Douglas EstelleYesterday

## 2016-12-06 LAB — TOXASSURE SELECT 13 (MW), URINE

## 2016-12-20 ENCOUNTER — Encounter: Payer: Medicare Other | Admitting: Pain Medicine

## 2017-01-04 ENCOUNTER — Telehealth: Payer: Self-pay | Admitting: Pain Medicine

## 2017-01-04 NOTE — Telephone Encounter (Signed)
Spoke with Douglas Andrews, states his last procedure was an epidural and he reports very good relief from this until yesterday.  States that on yesterday he picked up a gallon of milk and turned wrong and is now having pain again.  I asked Douglas Andrews if he felt he needed to come in for procedure or perhaps give it a little time since his back just flared up on yesterday.  His question was if he could apply heat to the area.  Explained that it might be best to apply ice to begin with for first 24 hours and then move to heat along with rational.  Patient verbalizes u/o information and will start with ice to see if he can calm the back pain down.  Instructed that if he felt he needed a procedure after giving it some time to call back and we will work on getting PA in place.

## 2017-01-04 NOTE — Telephone Encounter (Signed)
Having lower back pain and wants to come in for procedure, no orders in since 2017. Let us know what kind of procedure we can get approved and sched

## 2017-01-25 ENCOUNTER — Ambulatory Visit (HOSPITAL_BASED_OUTPATIENT_CLINIC_OR_DEPARTMENT_OTHER): Payer: Medicare Other | Admitting: Pain Medicine

## 2017-01-25 ENCOUNTER — Ambulatory Visit
Admission: RE | Admit: 2017-01-25 | Discharge: 2017-01-25 | Disposition: A | Discharge status: Home / Self Care | Payer: Medicare Other | Source: Ambulatory Visit | Attending: Pain Medicine | Admitting: Pain Medicine

## 2017-01-25 ENCOUNTER — Encounter: Payer: Self-pay | Admitting: Pain Medicine

## 2017-01-25 VITALS — BP 136/111 | HR 84 | Temp 97.9°F | Resp 14 | Ht 68.0 in | Wt 170.0 lb

## 2017-01-25 DIAGNOSIS — Z888 Allergy status to other drugs, medicaments and biological substances status: Secondary | ICD-10-CM | POA: Diagnosis not present

## 2017-01-25 DIAGNOSIS — G8929 Other chronic pain: Secondary | ICD-10-CM

## 2017-01-25 DIAGNOSIS — M545 Low back pain: Secondary | ICD-10-CM | POA: Diagnosis not present

## 2017-01-25 DIAGNOSIS — M79604 Pain in right leg: Secondary | ICD-10-CM

## 2017-01-25 DIAGNOSIS — M5136 Other intervertebral disc degeneration, lumbar region: Secondary | ICD-10-CM | POA: Diagnosis not present

## 2017-01-25 DIAGNOSIS — M5441 Lumbago with sciatica, right side: Secondary | ICD-10-CM

## 2017-01-25 MED ORDER — SODIUM CHLORIDE 0.9 % IJ SOLN
INTRAMUSCULAR | Status: AC
  Filled 2017-01-25: qty 20

## 2017-01-25 MED ORDER — SODIUM CHLORIDE 0.9% FLUSH: 2.0000 mL | Freq: Once | INTRAVENOUS | Status: DC

## 2017-01-25 MED ORDER — IOPAMIDOL (ISOVUE-M 200) INJECTION 41%
INTRAMUSCULAR | Status: AC
  Filled 2017-01-25: qty 10

## 2017-01-25 MED ORDER — ROPIVACAINE HCL 2 MG/ML IJ SOLN
INTRAMUSCULAR | Status: AC
  Filled 2017-01-25: qty 10

## 2017-01-25 MED ORDER — IOPAMIDOL (ISOVUE-M 200) INJECTION 41%
10.0000 mL | Freq: Once | INTRAMUSCULAR | Status: AC
  Administered 2017-01-25: 10 mL via EPIDURAL

## 2017-01-25 MED ORDER — LIDOCAINE HCL (PF) 1 % IJ SOLN
INTRAMUSCULAR | Status: AC
  Filled 2017-01-25: qty 10

## 2017-01-25 MED ORDER — ROPIVACAINE HCL 2 MG/ML IJ SOLN: 2.0000 mL | Freq: Once | INTRAMUSCULAR | Status: DC

## 2017-01-25 MED ORDER — TRIAMCINOLONE ACETONIDE 40 MG/ML IJ SUSP
40.0000 mg | Freq: Once | INTRAMUSCULAR | Status: AC
  Administered 2017-01-25: 40 mg

## 2017-01-25 MED ORDER — TRIAMCINOLONE ACETONIDE 40 MG/ML IJ SUSP
INTRAMUSCULAR | Status: AC
  Filled 2017-01-25: qty 1

## 2017-01-25 MED ORDER — LIDOCAINE HCL (PF) 1 % IJ SOLN
10.0000 mL | Freq: Once | INTRAMUSCULAR | Status: AC
  Administered 2017-01-25: 5 mL

## 2017-01-25 NOTE — Patient Instructions (Signed)
Post-Procedure instructions Instructions:  Apply ice: Fill a plastic sandwich bag with crushed ice. Cover it with a small towel and apply to injection site. Apply for 15 minutes then remove x 15 minutes. Repeat sequence on day of procedure, until you go to bed. The purpose is to minimize swelling and discomfort after procedure.  Apply heat: Apply heat to procedure site starting the day following the procedure. The purpose is to treat any soreness and discomfort from the procedure.  Food intake: Start with clear liquids (like water) and advance to regular food, as tolerated.   Physical activities: Keep activities to a minimum for the first 8 hours after the procedure.   Driving: If you have received any sedation, you are not allowed to drive for 24 hours after your procedure.  Blood thinner: Restart your blood thinner 6 hours after your procedure. (Only for those taking blood thinners)  Insulin: As soon as you can eat, you may resume your normal dosing schedule. (Only for those taking insulin)  Infection prevention: Keep procedure site clean and dry.  Post-procedure Pain Diary: Extremely important that this be done correctly and accurately. Recorded information will be used to determine the next step in treatment.  Pain evaluated is that of treated area only. Do not include pain from an untreated area.  Complete every hour, on the hour, for the initial 8 hours. Set an alarm to help you do this part accurately.  Do not go to sleep and have it completed later. It will not be accurate.  Follow-up appointment: Keep your follow-up appointment after the procedure. Usually 2 weeks for most procedures. (6 weeks in the case of radiofrequency.) Bring you pain diary.  Expect:  From numbing medicine (AKA: Local Anesthetics): Numbness or decrease in pain.  Onset: Full effect within 15 minutes of injected.  Duration: It will depend on the type of local anesthetic used. On the average, 1 to 8  hours.   From steroids: Decrease in swelling or inflammation. Once inflammation is improved, relief of the pain will follow.  Onset of benefits: Depends on the amount of swelling present. The more swelling, the longer it will take for the benefits to be seen.   Duration: Steroids will stay in the system x 2 weeks. Duration of benefits will depend on multiple posibilities including persistent irritating factors.  From procedure: Some discomfort is to be expected once the numbing medicine wears off. This should be minimal if ice and heat are applied as instructed. Call if:  You experience numbness and weakness that gets worse with time, as opposed to wearing off.  New onset bowel or bladder incontinence. (Spinal procedures only)  Emergency Numbers:  Durning business hours (Monday - Thursday, 8:00 AM - 4:00 PM) (Friday, 9:00 AM - 12:00 Noon): [phone]  After hours: [phone]   __________________________________________________________________________________________    

## 2017-01-25 NOTE — Progress Notes (Signed)
Safety precautions to be maintained throughout the outpatient stay will include: orient to surroundings, keep bed in low position, maintain call bell within reach at all times, provide assistance with transfer out of bed and ambulation.  

## 2017-01-25 NOTE — Progress Notes (Addendum)
Patient's Name: Douglas Andrews  MRN: [id number]  Referring Provider: Katharine Look, MD  DOB: Oct 21, 1950  PCP: Katharine Look, MD  DOS: 01/25/2017  Note by: Sydnee Levans. Laban Emperor, MD  Service setting: Ambulatory outpatient  Location: [facility] (AMB) Pain Management Facility  Visit type: Procedure  Specialty: Interventional Pain Management  Patient type: Established   Primary Reason for Visit: Interventional Pain Management Treatment. CC: Back Pain (low)  Procedure:  Anesthesia, Analgesia, Anxiolysis:  Type: Palliative Inter-Laminar Epidural Steroid Injection Region: Lumbar Level: L4-5 Level. Laterality: Right-Sided Paramedial  Type: Local Anesthesia Local Anesthetic: Lidocaine 1% Route: Infiltration ([place]/IM) IV Access: Declined Sedation: Declined  Indication(s): Analgesia          Indications: 1. Chronic lower extremity pain (Location of Secondary source of pain) (Right)   2. Chronic low back pain (Location of Primary Source of Pain) (Bilateral) (L>R)   3.  Lumbar DDD (degenerative disc disease)    Pain Score: Pre-procedure: 5 /10 Post-procedure: 0-No pain/10  Pre-op Assessment:  Previous date of service: 11/29/16 Service provided: Med Refill Douglas Andrews is a 66 y.o. (year old), male patient, seen today for interventional treatment. He  has a past surgical history that includes Appendectomy; Tonsillectomy; and left rod. His primarily concern today is the Back Pain (low)  Initial Vital Signs: Blood pressure (!) 171/99, pulse 79, temperature 97.9 F (36.6 C), resp. rate 18, height  (1.727 m), weight 170 lb (77.1 kg), SpO2 100 %. BMI: 25.85 kg/m  Risk Assessment: Allergies: Reviewed. He is allergic to sertraline.  Allergy Precautions: None required Coagulopathies: "Reviewed. None identified.  Blood-thinner therapy: None at this time Active Infection(s): Reviewed. None identified. Douglas Andrews is afebrile  Site Confirmation: Douglas Andrews was asked to confirm the procedure and  laterality before marking the site Procedure checklist: Completed Consent: Before the procedure and under the influence of no sedative(s), amnesic(s), or anxiolytics, the patient was informed of the treatment options, risks and possible complications. To fulfill our ethical and legal obligations, as recommended by the American Medical Association's Code of Ethics, I have informed the patient of my clinical impression; the nature and purpose of the treatment or procedure; the risks, benefits, and possible complications of the intervention; the alternatives, including doing nothing; the risk(s) and benefit(s) of the alternative treatment(s) or procedure(s); and the risk(s) and benefit(s) of doing nothing. The patient was provided information about the general risks and possible complications associated with the procedure. These may include, but are not limited to: failure to achieve desired goals, infection, bleeding, organ or nerve damage, allergic reactions, paralysis, and death. In addition, the patient was informed of those risks and complications associated to Spine-related procedures, such as failure to decrease pain; infection (i.e.: Meningitis, epidural or intraspinal abscess); bleeding (i.e.: epidural hematoma, subarachnoid hemorrhage, or any other type of intraspinal or peri-dural bleeding); organ or nerve damage (i.e.: Any type of peripheral nerve, nerve root, or spinal cord injury) with subsequent damage to sensory, motor, and/or autonomic systems, resulting in permanent pain, numbness, and/or weakness of one or several areas of the body; allergic reactions; (i.e.: anaphylactic reaction); and/or death. Furthermore, the patient was informed of those risks and complications associated with the medications. These include, but are not limited to: allergic reactions (i.e.: anaphylactic or anaphylactoid reaction(s)); adrenal axis suppression; blood sugar elevation that in diabetics may result in  ketoacidosis or comma; water retention that in patients with history of congestive heart failure may result in shortness of breath, pulmonary edema, and decompensation with resultant  heart failure; weight gain; swelling or edema; medication-induced neural toxicity; particulate matter embolism and blood vessel occlusion with resultant organ, and/or nervous system infarction; and/or aseptic necrosis of one or more joints. Finally, the patient was informed that Medicine is not an exact science; therefore, there is also the possibility of unforeseen or unpredictable risks and/or possible complications that may result in a catastrophic outcome. The patient indicated having understood very clearly. We have given the patient no guarantees and we have made no promises. Enough time was given to the patient to ask questions, all of which were answered to the patient's satisfaction. Douglas Andrews has indicated that he wanted to continue with the procedure. Attestation: I, the ordering provider, attest that I have discussed with the patient the benefits, risks, side-effects, alternatives, likelihood of achieving goals, and potential problems during recovery for the procedure that I have provided informed consent. Date: 01/25/2017; Time: 8:29 AM  Pre-Procedure Preparation:  Monitoring: As per clinic protocol. Respiration, ETCO2, SpO2, BP, heart rate and rhythm monitor placed and checked for adequate function Safety Precautions: Patient was assessed for positional comfort and pressure points before starting the procedure. Time-out: I initiated and conducted the "Time-out" before starting the procedure, as per protocol. The patient was asked to participate by confirming the accuracy of the "Time Out" information. Verification of the correct person, site, and procedure were performed and confirmed by me, the nursing staff, and the patient. "Time-out" conducted as per Joint Commission's Universal Protocol  (UP.01.01.01). "Time-out" Date & Time: 01/25/2017; 0900 hrs.  Description of Procedure Process:   Position: Prone with head of the table was raised to facilitate breathing. Target Area: The interlaminar space, initially targeting the lower laminar border of the superior vertebral body. Approach: Paramedial approach. Area Prepped: Entire Posterior Lumbar Region Prepping solution: ChloraPrep (2% chlorhexidine gluconate and 70% isopropyl alcohol) Safety Precautions: Aspiration looking for blood return was conducted prior to all injections. At no point did we inject any substances, as a needle was being advanced. No attempts were made at seeking any paresthesias. Safe injection practices and needle disposal techniques used. Medications properly checked for expiration dates. SDV (single dose vial) medications used. Description of the Procedure: Protocol guidelines were followed. The procedure needle was introduced through the skin, ipsilateral to the reported pain, and advanced to the target area. Bone was contacted and the needle walked caudad, until the lamina was cleared. The epidural space was identified using "loss-of-resistance technique" with 2-3 ml of PF-NaCl (0.9% NSS), in a 5cc LOR glass syringe. Vitals:   01/25/17 0855 01/25/17 0900 01/25/17 0905 01/25/17 0910  BP: (!) 177/109 (!) 155/117 (!) 148/103 (!) 136/111  Pulse: 82 81 84 84  Resp: 14 (!) Temp:      SpO2: 94% 99% 98% 98%  Weight:      Height:        Start Time: 0900 hrs. End Time: 0908 hrs. Materials:  Needle(s) Type: Epidural needle Gauge: 17G Length: 3.5-in Medication(s): We administered iopamidol, triamcinolone acetonide, and lidocaine (PF). Please see chart orders for dosing details.  Imaging Guidance (Spinal):  Type of Imaging Technique: Fluoroscopy Guidance (Spinal) Indication(s): Assistance in needle guidance and placement for procedures requiring needle placement in or near specific anatomical locations  not easily accessible without such assistance. Exposure Time: Please see nurses notes. Contrast: Before injecting any contrast, we confirmed that the patient did not have an allergy to iodine, shellfish, or radiological contrast. Once satisfactory needle placement was completed at the desired  level, radiological contrast was injected. Contrast injected under live fluoroscopy. No contrast complications. See chart for type and volume of contrast used. Fluoroscopic Guidance: I was personally present during the use of fluoroscopy. "Tunnel Vision Technique" used to obtain the best possible view of the target area. Parallax error corrected before commencing the procedure. "Direction-depth-direction" technique used to introduce the needle under continuous pulsed fluoroscopy. Once target was reached, antero-posterior, oblique, and lateral fluoroscopic projection used confirm needle placement in all planes. Images permanently stored in EMR. Interpretation: I personally interpreted the imaging intraoperatively. Adequate needle placement confirmed in multiple planes. Appropriate spread of contrast into desired area was observed. No evidence of afferent or efferent intravascular uptake. No intrathecal or subarachnoid spread observed. Permanent images saved into the patient's record.  Antibiotic Prophylaxis:  Indication(s): None identified Antibiotic given: None  Post-operative Assessment:  EBL: None Complications: No immediate post-treatment complications observed by team, or reported by patient. Note: The patient tolerated the entire procedure well. A repeat set of vitals were taken after the procedure and the patient was kept under observation following institutional policy, for this type of procedure. Post-procedural neurological assessment was performed, showing return to baseline, prior to discharge. The patient was provided with post-procedure discharge instructions, including a section on how to identify  potential problems. Should any problems arise concerning this procedure, the patient was given instructions to immediately contact us, at any time, without hesitation. In any case, we plan to contact the patient by telephone for a follow-up status report regarding this interventional procedure. Comments:  No additional relevant information.  Plan of Care  Disposition: Discharge home  Discharge Date & Time: 01/25/2017; 0915 hrs.  Physician-requested Follow-up:  Return in about 2 weeks (around 02/08/2017) for Post-Procedure evaluation.  Future Appointments Date Time Provider Department Center  02/23/2017 9:00 AM Crystal Cameron Ali, NP [facility] None   Medications ordered for procedure: Meds ordered this encounter  Medications  . iopamidol (ISOVUE-M) 41 % intrathecal injection 10 mL  . triamcinolone acetonide (KENALOG-40) injection 40 mg  . lidocaine (PF) (XYLOCAINE) 1 % injection 10 mL  . sodium chloride flush (NS) 0.9 % injection 2 mL  . ropivacaine (PF) 2 mg/mL (0.2%) (NAROPIN) injection 2 mL   Medications administered: We administered iopamidol, triamcinolone acetonide, and lidocaine (PF).  See the medical record for exact dosing, route, and time of administration.  Lab-work, Procedure(s), & Referral(s) Ordered: Orders Placed This Encounter  Procedures  . Lumbar Epidural Injection  . DG C-Arm 1-60 Min-No Report  . Discharge instructions  . Follow-up  . Informed Consent Details: Transcribe to consent form and obtain patient signature  . Provider attestation of informed consent for procedure/surgical case  . Verify informed consent   Imaging Ordered: Results for orders placed in visit on 10/12/16  DG C-Arm 1-60 Min-No Report   Narrative There is no Radiologist interpretation  for this exam.   New Prescriptions   No medications on file   Primary Care Physician: Katharine Look, MD Location: [facility] Note by: Sydnee Levans. Laban Emperor, M.D, DABA,  DABAPM, DABPM, DABIPP, FIPP Date: 01/25/2017; Time: 9:20 AM  Disclaimer:  Medicine is not an Visual merchandiser. The only guarantee in medicine is that nothing is guaranteed. It is important to note that the decision to proceed with this intervention was based on the information collected from the patient. The Data and conclusions were drawn from the patient's questionnaire, the interview, and the physical examination. Because the information was provided in large part  by the patient, it cannot be guaranteed that it has not been purposely or unconsciously manipulated. Every effort has been made to obtain as much relevant data as possible for this evaluation. It is important to note that the conclusions that lead to this procedure are derived in large part from the available data. Always take into account that the treatment will also be dependent on availability of resources and existing treatment guidelines, considered by other Pain Management Practitioners as being common knowledge and practice, at the time of the intervention. For Medico-Legal purposes, it is also important to point out that variation in procedural techniques and pharmacological choices are the acceptable norm. The indications, contraindications, technique, and results of the above procedure should only be interpreted and judged by a Board-Certified Interventional Pain Specialist with extensive familiarity and expertise in the same exact procedure and technique.   Instructions provided at this appointment: Patient Instructions  Post-Procedure instructions Instructions:  Apply ice: Fill a plastic sandwich bag with crushed ice. Cover it with a small towel and apply to injection site. Apply for 15 minutes then remove x 15 minutes. Repeat sequence on day of procedure, until you go to bed. The purpose is to minimize swelling and discomfort after procedure.  Apply heat: Apply heat to procedure site starting the day following the procedure. The  purpose is to treat any soreness and discomfort from the procedure.  Food intake: Start with clear liquids (like water) and advance to regular food, as tolerated.   Physical activities: Keep activities to a minimum for the first 8 hours after the procedure.   Driving: If you have received any sedation, you are not allowed to drive for 24 hours after your procedure.  Blood thinner: Restart your blood thinner 6 hours after your procedure. (Only for those taking blood thinners)  Insulin: As soon as you can eat, you may resume your normal dosing schedule. (Only for those taking insulin)  Infection prevention: Keep procedure site clean and dry.  Post-procedure Pain Diary: Extremely important that this be done correctly and accurately. Recorded information will be used to determine the next step in treatment.  Pain evaluated is that of treated area only. Do not include pain from an untreated area.  Complete every hour, on the hour, for the initial 8 hours. Set an alarm to help you do this part accurately.  Do not go to sleep and have it completed later. It will not be accurate.  Follow-up appointment: Keep your follow-up appointment after the procedure. Usually 2 weeks for most procedures. (6 weeks in the case of radiofrequency.) Bring you pain diary.  Expect:  From numbing medicine (AKA: Local Anesthetics): Numbness or decrease in pain.  Onset: Full effect within 15 minutes of injected.  Duration: It will depend on the type of local anesthetic used. On the average, 1 to 8 hours.   From steroids: Decrease in swelling or inflammation. Once inflammation is improved, relief of the pain will follow.  Onset of benefits: Depends on the amount of swelling present. The more swelling, the longer it will take for the benefits to be seen.   Duration: Steroids will stay in the system x 2 weeks. Duration of benefits will depend on multiple posibilities including persistent irritating factors.  From  procedure: Some discomfort is to be expected once the numbing medicine wears off. This should be minimal if ice and heat are applied as instructed. Call if:  You experience numbness and weakness that gets worse with time, as opposed  to wearing off.  New onset bowel or bladder incontinence. (Spinal procedures only)  Emergency Numbers:  Durning business hours (Monday - Thursday, 8:00 AM - 4:00 PM) (Friday, 9:00 AM - 12:00 Noon): [phone]  After hours: [phone]   __________________________________________________________________________________________

## 2017-01-26 ENCOUNTER — Telehealth: Payer: Self-pay

## 2017-01-26 NOTE — Telephone Encounter (Signed)
Post procedure phone call.  Patient states he is making it.  States he has wet on himself 3 times during the night.  Denies fever, progressive weakness, pain, redness or swelling.  Notified Dr Laban Emperor.  Patient states his pain has gone away.  Informed patient to monitor his urination pattern, temperature and to call us for any changes and if he get progressively weaker, or increased pain in legs or low back.  Informed him to call for any concerns.  Patient states understanding and was able to teach back 3.

## 2017-02-02 ENCOUNTER — Telehealth: Payer: Self-pay | Admitting: Pain Medicine

## 2017-02-02 NOTE — Telephone Encounter (Signed)
Patient having problems with urinating leakage since he had procedure. Please call him

## 2017-02-02 NOTE — Telephone Encounter (Signed)
Spoke with patient.  Patient states he continues to have some incontinence but it is not as bad as it was before.  Continues to deny progressive weakness, increased pain, fever, redness at incision sites  or bowel problems.  States he feels great and is having no pain.  Denies numbness.  Instructed patient to notify his PCP and to call us back if symptoms change or increase.

## 2017-02-13 DIAGNOSIS — N3942 Incontinence without sensory awareness: Secondary | ICD-10-CM | POA: Insufficient documentation

## 2017-02-15 ENCOUNTER — Ambulatory Visit: Payer: Medicare Other | Attending: Pain Medicine | Admitting: Pain Medicine

## 2017-02-21 NOTE — Progress Notes (Signed)
Patient's Name: Douglas Andrews  MRN: [id number]  Referring Provider: Fritzi Mandes, MD  DOB: 08-03-1951  PCP: Fritzi Mandes, MD  DOS: 02/23/2017  Note by: Vevelyn Francois NP  Service setting: Ambulatory outpatient  Specialty: Interventional Pain Management  Location: [facility] (AMB) Pain Management Facility    Patient type: Established    Primary Reason(s) for Visit: Encounter for prescription drug management (Level of risk: moderate) CC: Back Pain  HPI  Douglas Andrews is a 66 y.o. year old, male patient, who comes today for a medication management evaluation. He has Chronic low back pain (Location of Primary Source of Pain) (Bilateral) (L>R); Chronic pain syndrome;  Lumbar DDD (degenerative disc disease);  Lumbar Spondylosis; Lumbar radicular pain (Right) (L5 Dermatome); Lumbar facet syndrome (Location of Primary Source of Pain) (Bilateral) (R>L); Opiate use (60 MME/Day); Uncomplicated opioid dependence ([place]); Long term prescription opiate use; Chronic knee pain (Location of Tertiary source of pain) (Bilateral) (Right); Vitamin D insufficiency; Overweight; Bell palsy; CN (constipation); Dermatitis, eczematoid; Diabetes mellitus ([place]); Dysmetria; Essential (primary) hypertension; Anxiety, generalized; Insomnia due to medical condition; Long term current use of systemic steroids; Type 2 diabetes mellitus ([place]); Pulmonary sarcoidosis ([place]); Arthropathy, traumatic, shoulder; Bursitis, trochanteric; Esophageal varices ([place]); Leg varices; Hepatic cirrhosis ([place]); Major depressive disorder with single episode; Other long term (current) drug therapy; At risk for falling; Encounter for chronic pain management; Encounter for therapeutic drug level monitoring; Chronic lower extremity pain (Location of Secondary source of pain) (Right); Chronic lumbar radicular pain (Left) (L5 Dermatome); Lumbar Levoscoliosis; Neurogenic pain; Osteoarthritis of knee (Right); At high risk for falls; Chest pain; Skin lesion; Pain of left  femur (left femoral rod due to sarcoidosis); Chronic hip pain (Right); Osteoarthritis of knee (Bilateral) (R>L); Essential hypertension; Folliculitis; Chronic sacroiliac joint pain (Left); Chronic hip pain (Left); Chronic thoracic back pain (Left); Diarrhea; and Numbness on left side on his problem list. His primarily concern today is the Back Pain  Pain Assessment: Self-Reported Pain Score: 5 /10 Clinically the patient looks like a       Reported level is inconsistent with clinical observations. Information on the proper use of the pain scale provided to the patient today Pain Type: Chronic pain Pain Location: Back Pain Orientation: Lower Pain Descriptors / Indicators: Aching, Discomfort, Throbbing Pain Frequency: Constant  Douglas Andrews was last scheduled for an appointment on 11/29/16 for medication management. During today's appointment we reviewed Douglas Andrews chronic pain status, as well as his outpatient medication regimen. He has chronic low back pain. He is SP LESI which ws effective. He continues to have 75% relief. He is not having any radicular symptoms at this time. He does admit that he has been stumbling more . He suffered a fall. He has also had a signficant weight reduction and this has not been intentional. He is down 21 pounds in the last 6 months. He admits he does have some constipation. He has straining. He states he uses MOM along with stool softener. He admits that he was having problems with diarrhea in the past. He is scheduled for a colonoscopy next week and is SP endoscopy.   The patient  reports that he does not use drugs. His body mass index is 23.82 kg/m.  Further details on both, my assessment(s), as well as the proposed treatment plan, please see below.  Controlled Substance Pharmacotherapy Assessment REMS (Risk Evaluation and Mitigation Strategy)  Analgesic:Oxycodone/APAP 10/325 one every 6 hours (40 mg/day) MME/day:60 mg/day  Douglas Specking, RN  02/23/2017  9:18 AM  Sign at close encounter Nursing Pain Medication Assessment:  Safety precautions to be maintained throughout the outpatient stay will include: orient to surroundings, keep bed in low position, maintain call bell within reach at all times, provide assistance with transfer out of bed and ambulation.  Medication Inspection Compliance: Pill count conducted under aseptic conditions, in front of the patient. Neither the pills nor the bottle was removed from the patient's sight at any time. Once count was completed pills were immediately returned to the patient in their original bottle.  Medication: See above Pill/Patch Count: 26 of 120 pills remain Pill/Patch Appearance: Markings consistent with prescribed medication Bottle Appearance: Standard pharmacy container. Clearly labeled. Filled Date: 4 / 54 / 2018 Last Medication intake:  Today   Pharmacokinetics: Liberation and absorption (onset of action): WNL Distribution (time to peak effect): WNL Metabolism and excretion (duration of action): WNL         Pharmacodynamics: Desired effects: Analgesia: Douglas Andrews reports >50% benefit. Functional ability: Patient reports that medication allows him to accomplish basic ADLs Clinically meaningful improvement in function (CMIF): Sustained CMIF goals met Perceived effectiveness: Described as relatively effective, allowing for increase in activities of daily living (ADL) Undesirable effects: Side-effects or Adverse reactions: None reported Monitoring: [place] PMP: Online review of the past 33-monthperiod conducted. Compliant with practice rules and regulations List of all UDS test(s) done:  Lab Results  Component Value Date   TOXASSSELUR FINAL 11/29/2016   TGerberFINAL 03/30/2016   TOXASSSELUR FINAL 11/19/2015   TOXASSSELUR FINAL 10/07/2015   Last UDS on record: [employer]ox[employer] Select 13  Date Value Ref Range Status  11/29/2016 FINAL  Final    Comment:     ==================================================================== TOXASSURE SELECT 13 (MW) ==================================================================== Test                             Result       Flag       Units Drug Present and Declared for Prescription Verification   Oxycodone                      150          EXPECTED   ng/mg creat   Noroxycodone                   646          EXPECTED   ng/mg creat    Sources of oxycodone include scheduled prescription medications.    Noroxycodone is an expected metabolite of oxycodone. ==================================================================== Test                      Result    Flag   Units      Ref Range   Creatinine              56               mg/dL      >=20 ==================================================================== Declared Medications:  The flagging and interpretation on this report are based on the  following declared medications.  Unexpected results may arise from  inaccuracies in the declared medications.  **Note: The testing scope of this panel includes these medications:  Oxycodone (Percocet)  **Note: The testing scope of this panel does not include following  reported medications:  Acetaminophen (Percocet)  Albuterol  Atorvastatin (Lipitor)  Cetirizine (Zyrtec)  Fluoxetine (Prozac)  Gabapentin  Hydroxyzine (Vistaril)  Insulin (Lantus)  Isosorbide (Imdur)  Liraglutide (Victoza)  Lisinopril  Metformin  Nitroglycerin  Omeprazole (Prilosec)  Supplement (Probiotic)  Vitamin D3 ==================================================================== For clinical consultation, please call [phone]. ====================================================================    UDS interpretation: Compliant          Medication Assessment Form: Reviewed. Patient indicates being compliant with therapy Treatment compliance: Compliant Risk Assessment Profile: Aberrant behavior: See prior evaluations.  None observed or detected today Comorbid factors increasing risk of overdose: See prior notes. No additional risks detected today Risk of substance use disorder (SUD): Low Opioid Risk Tool (ORT) Total Score: 0  Interpretation Table:  Score <3 = Low Risk for SUD  Score between 4-7 = Moderate Risk for SUD  Score >8 = High Risk for Opioid Abuse   Risk Mitigation Strategies:  Patient Counseling: Covered Patient-Prescriber Agreement (PPA): Present and active  Notification to other healthcare providers: Done  Pharmacologic Plan: No change in therapy, at this time  Laboratory Chemistry  Inflammation Markers Lab Results  Component Value Date   CRP <0.5 03/30/2016   ESRSEDRATE 1 03/30/2016   (CRP: Acute Phase) (ESR: Chronic Phase) Renal Function Markers Lab Results  Component Value Date   BUN 12 03/30/2016   CREATININE 0.85 03/30/2016   GFRAA >60 03/30/2016   GFRNONAA >60 03/30/2016   Hepatic Function Markers Lab Results  Component Value Date   AST 24 03/30/2016   ALT 27 03/30/2016   ALBUMIN 4.6 03/30/2016   ALKPHOS 63 03/30/2016   Electrolytes Lab Results  Component Value Date   NA 136 03/30/2016   K 3.8 03/30/2016   CL 100 (L) 03/30/2016   CALCIUM 9.2 03/30/2016   MG 2.2 03/30/2016   Neuropathy Markers Lab Results  Component Value Date   VITAMINB12 539 03/30/2016   Bone Pathology Markers Lab Results  Component Value Date   ALKPHOS 63 03/30/2016   25OHVITD1 38 03/30/2016   25OHVITD2 3.4 03/30/2016   25OHVITD3 35 03/30/2016   CALCIUM 9.2 03/30/2016   Coagulation Parameters No results found for: INR, LABPROT, APTT, PLT Cardiovascular Markers No results found for: BNP, HGB, HCT Note: Lab results reviewed.  Recent Diagnostic Imaging Review  Dg C-arm 1-60 Min-no Report  Result Date: 01/25/2017 Fluoroscopy was utilized by the requesting physician.  No radiographic interpretation.   Note: Imaging results reviewed.          Meds  The patient has a current  medication list which includes the following prescription(s): albuterol, aspirin ec, atorvastatin, cetirizine, vitamin d3, fluoxetine, glucose blood, hydroxyzine, ibuprofen-diphenhydramine cit, insulin glargine, insulin pen needle, isosorbide mononitrate, lisinopril, metformin, nitroglycerin, victoza, albuterol, gabapentin, oxycodone-acetaminophen, oxycodone-acetaminophen, and oxycodone-acetaminophen.  Current Outpatient Prescriptions on File Prior to Visit  Medication Sig  . albuterol (PROAIR HFA) 108 (90 Base) MCG/ACT inhaler INHALE 2 PUFFS BY MOUTH EVERY 4 HOURS ASNEEDED FOR WHEEZING  . aspirin EC 81 MG tablet Take 81 mg by mouth.  Marland Kitchen atorvastatin (LIPITOR) 40 MG tablet Take 40 mg by mouth daily.  . cetirizine (ZYRTEC) 10 MG tablet Take 10 mg by mouth daily.   . Cholecalciferol (VITAMIN D3) 5000 units TABS Take 1 tablet by mouth daily.   Marland Kitchen FLUoxetine (PROZAC) 20 MG capsule Take 20 mg by mouth daily.   Marland Kitchen glucose blood (ACCU-CHEK ACTIVE STRIPS) test strip Checks 3 times daily  . hydrOXYzine (VISTARIL) 25 MG capsule Take 50 mg by mouth every 6 (six) hours as needed.   . Ibuprofen-Diphenhydramine Cit (ADVIL PM PO) Take 1 tablet by mouth at bedtime.  . insulin  glargine (LANTUS) 100 UNIT/ML injection Inject 60 Units into the skin at bedtime.   . Insulin Pen Needle (UNIFINE PENTIPS) 31G X 6 MM MISC   . isosorbide mononitrate (IMDUR) 30 MG 24 hr tablet 30 mg daily.   Marland Kitchen lisinopril (PRINIVIL,ZESTRIL) 10 MG tablet Take 10 mg by mouth daily.   . metFORMIN (GLUCOPHAGE) 500 MG tablet Take 1,000 mg by mouth 2 (two) times daily with a meal.  . nitroGLYCERIN (NITROSTAT) 0.4 MG SL tablet Place 0.4 mg under the tongue every 5 (five) minutes as needed for chest pain.  Marland Kitchen VICTOZA 18 MG/3ML SOPN Inject into the skin 3 (three) times daily.   Marland Kitchen albuterol (PROVENTIL) (5 MG/ML) 0.5% nebulizer solution Take 2.5 mg by nebulization every 6 (six) hours as needed for wheezing or shortness of breath.    No current  facility-administered medications on file prior to visit.    ROS  Constitutional: Denies any fever or chills Gastrointestinal: No reported hemesis, hematochezia, vomiting, or acute GI distress Musculoskeletal: Denies any acute onset joint swelling, redness, loss of ROM, or weakness Neurological: No reported episodes of acute onset apraxia, aphasia, dysarthria, agnosia, amnesia, paralysis, loss of coordination, or loss of consciousness  Allergies  Mr. Wimer is allergic to sertraline.  [place]  Drug: Mr. Bunton  reports that he does not use drugs. Alcohol:  reports that he does not drink alcohol. Tobacco:  reports that he has never smoked. He has never used smokeless tobacco. Medical:  has a past medical history of Chronic back pain; Diabetes mellitus without complication ([place]); Hyperlipidemia; Hypertension; Left leg pain (01/02/2014); Right leg pain (07/15/2015); and Sarcoidosis. Family: family history includes Diabetes in his father; Heart disease in his father and mother; Stroke in his father.  Past Surgical History:  Procedure Laterality Date  . APPENDECTOMY    . left rod    . TONSILLECTOMY     Constitutional Exam  General appearance: Well nourished, well developed, and well hydrated. In no apparent acute distress Vitals:   02/23/17 0903  BP: (!) 142/92  Pulse: 77  Resp: 16  Temp: 98 F (36.7 C)  TempSrc: Oral  SpO2: 100%  Weight: 159 lb (72.1 kg)  Height: 5' 8.5" (1.74 m)   BMI Assessment: Estimated body mass index is 23.82 kg/m as calculated from the following:   Height as of this encounter: 5' 8.5" (1.74 m).   Weight as of this encounter: 159 lb (72.1 kg).  BMI interpretation table: BMI level Category Range association with higher incidence of chronic pain  <18 kg/m2 Underweight   18.5-24.9 kg/m2 Ideal body weight   25-29.9 kg/m2 Overweight Increased incidence by 20%  30-34.9 kg/m2 Obese (Class I) Increased incidence by 68%  35-39.9 kg/m2 Severe obesity (Class II)  Increased incidence by 136%  >40 kg/m2 Extreme obesity (Class III) Increased incidence by 254%   BMI Readings from Last 4 Encounters:  02/23/17 23.82 kg/m  01/25/17 25.85 kg/m  11/29/16 26.15 kg/m  10/12/16 27.37 kg/m   Wt Readings from Last 4 Encounters:  02/23/17 159 lb (72.1 kg)  01/25/17 170 lb (77.1 kg)  11/29/16 172 lb (78 kg)  10/12/16 180 lb (81.6 kg)  Psych/Mental status: Alert, oriented x 3 (person, place, & time)       Eyes: PERLA Respiratory: No evidence of acute respiratory distress, audible wheezing with ambulation   Lumbar Spine Exam  Inspection: No masses, redness, or swelling Alignment: Symmetrical Functional ROM: Unrestricted ROM      Stability: No instability detected Muscle  strength & Tone: Functionally intact Sensory: Unimpaired Palpation: No palpable anomalies       Provocative Tests: Lumbar Hyperextension and rotation test: Negative       Patrick's Maneuver: evaluation deferred today                    Gait & Posture Assessment  Ambulation: Unassisted Gait: Relatively normal for age and body habitus Posture: WNL   Lower Extremity Exam    Side: Right lower extremity  Side: Left lower extremity  Inspection: No masses, redness, swelling, or asymmetry. No contractures  Inspection: No masses, redness, swelling, or asymmetry. No contractures  Functional ROM: Unrestricted ROM          Functional ROM: Unrestricted ROM          Muscle strength & Tone: Functionally intact  Muscle strength & Tone: Functionally intact  Sensory: Unimpaired  Sensory: Unimpaired  Palpation: No palpable anomalies  Palpation: No palpable anomalies   Assessment  Primary Diagnosis & Pertinent Problem List: The primary encounter diagnosis was  Lumbar Spondylosis. Diagnoses of  Lumbar DDD (degenerative disc disease), Lumbar facet syndrome (Location of Primary Source of Pain) (Bilateral) (R>L), Neurogenic pain, and Chronic pain syndrome were also pertinent to this visit.  Status  Diagnosis  Controlled Controlled Controlled 1.  Lumbar Spondylosis   2.  Lumbar DDD (degenerative disc disease)   3. Lumbar facet syndrome (Location of Primary Source of Pain) (Bilateral) (R>L)   4. Neurogenic pain   5. Chronic pain syndrome     Problems updated and reviewed during this visit: No problems updated. Plan of Care  Pharmacotherapy (Medications Ordered): Meds ordered this encounter  Medications  . oxyCODONE-acetaminophen (PERCOCET) 10-325 MG tablet    Sig: Take 1 tablet by mouth every 6 (six) hours as needed for pain.    Dispense:  120 tablet    Refill:  0    Do not place this medication, or any other prescription from our practice, on "Automatic Refill". Patient may have prescription filled one day early if pharmacy is closed on scheduled refill date. Do not fill until: 04/2517 To last until: 06/02/17    Order Specific Question:   Supervising Provider    Answer:   Milinda Pointer [phone]  . oxyCODONE-acetaminophen (PERCOCET) 10-325 MG tablet    Sig: Take 1 tablet by mouth every 6 (six) hours as needed for pain.    Dispense:  120 tablet    Refill:  0    Do not place this medication, or any other prescription from our practice, on "Automatic Refill". Patient may have prescription filled one day early if pharmacy is closed on scheduled refill date. Do not fill until: 03/04/17 To last until: 04/03/17    Order Specific Question:   Supervising Provider    Answer:   Milinda Pointer [phone]  . oxyCODONE-acetaminophen (PERCOCET) 10-325 MG tablet    Sig: Take 1 tablet by mouth every 6 (six) hours as needed for pain.    Dispense:  120 tablet    Refill:  0    Do not place this medication, or any other prescription from our practice, on "Automatic Refill". Patient may have prescription filled one day early if pharmacy is closed on scheduled refill date. Do not fill until: 04/03/17 To last until: 05/03/17    Order Specific Question:   Supervising Provider    Answer:    Milinda Pointer [phone]  . gabapentin (NEURONTIN) 600 MG tablet    Sig: Take 1 tablet (600  mg total) by mouth every 8 (eight) hours.    Dispense:  270 tablet    Refill:  0    Do not place this medication, or any other prescription from our practice, on "Automatic Refill". Patient may have prescription filled one day early if pharmacy is closed on scheduled refill date.    Order Specific Question:   Supervising Provider    Answer:   Milinda Pointer [[id number]]   New Prescriptions   No medications on file   Medications administered today: Mr. Eschmann had no medications administered during this visit. Lab-work, procedure(s), and/or referral(s): No orders of the defined types were placed in this encounter.  Imaging and/or referral(s): None  Interventional therapies: Planned, scheduled, and/or pending:   Continue with current regimen. Will reevaluate constipation/ straining after colonoscopy at next visit. Pt can call in sooner if he would like to try Amitza or alternative. However would like to see GI recommendation secondary to his history. Consider changing Percocet to Oxycodone secondary to endoscopy indicating cirrhosis of the liver.    Considering:   Diagnostic bilateral Lumbar facet block Possible bilateral lumbar facet radiofrequency ablation Diagnostic right intra-articular hip injection Possible radiofrequency of the right hip joint Diagnostic Right L4-5 lumbar epidural steroid injection Diagnostic bilateral intra-articular knee injection Possible series of 5 Hyalgan knee injections.  Diagnostic bilateral Genicular nerve block Possible bilateral Genicular nerve radiofrequency ablation   Palliative PRN treatment(s):   Palliative bilateral Lumbar facet block Palliative right intra-articular hip injection Palliative Right L4-5 lumbar epidural steroid injection Palliative bilateral intra-articular knee injection Palliative bilateral Genicular nerve block    Provider-requested follow-up: Return in about 3 months (around 05/26/2017) for Medication Mgmt.  No future appointments. Primary Care Physician: Fritzi Mandes, MD Location: [facility] Note by: Vevelyn Francois NP Date: 02/23/2017; Time: 9:44 AM  Pain Score Disclaimer: We use the NRS-11 scale. This is a self-reported, subjective measurement of pain severity with only modest accuracy. It is used primarily to identify changes within a particular patient. It must be understood that outpatient pain scales are significantly less accurate that those used for research, where they can be applied under ideal controlled circumstances with minimal exposure to variables. In reality, the score is likely to be a combination of pain intensity and pain affect, where pain affect describes the degree of emotional arousal or changes in action readiness caused by the sensory experience of pain. Factors such as social and work situation, setting, emotional state, anxiety levels, expectation, and prior pain experience may influence pain perception and show large inter-individual differences that may also be affected by time variables.  Patient instructions provided during this appointment: Patient Instructions   ____________________________________________________________________________________________  Medication Rules  Applies to: All patients receiving prescriptions (written or electronic).  Pharmacy of record: Pharmacy where electronic prescriptions will be sent. If written prescriptions are taken to a different pharmacy, please inform the nursing staff. The pharmacy listed in the electronic medical record should be the one where you would like electronic prescriptions to be sent.  Prescription refills: Only during scheduled appointments. Applies to both, written and electronic prescriptions.  NOTE: The following applies primarily to controlled substances (Opioid Pain  Medications)  Patient's responsibilities: 1. Pain Pills: Bring all pain pills to every appointment (except for procedure appointments). 2. Pill Bottles: Bring pills in original pharmacy bottle. Always bring newest bottle. Bring bottle, even if empty. 3. Medication refills: You are responsible for knowing and keeping track of what medications you need refilled.  The day before your appointment, write a list of all prescriptions that need to be refilled. Bring that list to your appointment and give it to the admitting nurse. Prescriptions will be written only during appointments. If you forget a medication, it will not be "Called in", "Faxed", or "electronically sent". You will need to get another appointment to get these prescribed. 4. Prescription Accuracy: You are responsible for carefully inspecting your prescriptions before leaving our office. Have the discharge nurse carefully go over each prescription with you, before taking them home. Make sure that your name is accurately spelled, that your address is correct. Check the name and dose of your medication to make sure it is accurate. Check the number of pills, and the written instructions to make sure they are clear and accurate. Make sure that you are given enough medication to last until your next medication refill appointment. 5. Taking Medication: Take medication as prescribed. Never take more pills than instructed. Never take medication more frequently than prescribed. Taking less pills or less frequently is permitted and encouraged, when it comes to controlled substances (written prescriptions).  6. Inform other Doctors: Always inform, all of your healthcare providers, of all the medications you take. 7. Pain Medication from other Providers: You are not allowed to accept any additional pain medication from any other Doctor or Healthcare provider. There are two exceptions to this rule. (see below) In the event that you require additional pain  medication, you are responsible for notifying us, as stated below. 8. Medication Agreement: You are responsible for carefully reading and following our Medication Agreement. This must be signed before receiving any prescriptions from our practice. Safely store a copy of your signed Agreement. Violations to the Agreement will result in no further prescriptions. (Additional copies of our Medication Agreement are available upon request.) 9. Laws, Rules, & Regulations: All patients are expected to follow all Federal and [employer], Rules, [employer]. Ignorance of the Laws does not constitute a valid excuse.  Exceptions: There are only two exceptions to the rule of not receiving pain medications from other Healthcare Providers. 1. Exception #1 (Emergencies): In the event of an emergency (i.e.: accident requiring emergency care), you are allowed to receive additional pain medication. However, you are responsible for: As soon as you are able, call our office [phone], at any time of the day or night, and leave a message stating your name, the date and nature of the emergency, and the name and dose of the medication prescribed. In the event that your call is answered by a member of our staff, make sure to document and save the date, time, and the name of the person that took your information.  2. Exception #2 (Planned Surgery): In the event that you are scheduled by another doctor or dentist to have any type of surgery or procedure, you are allowed (for a period no longer than 30 days), to receive additional pain medication, for the acute post-op pain. However, in this case, you are responsible for picking up a copy of our "Post-op Pain Management for Surgeons" handout, and giving it to your surgeon or dentist. This document is available at our office, and does not require an appointment to obtain it. Simply go to our office during business hours (Monday-Thursday from 8:00 AM to 4:00 PM) (Friday  8:00 AM to 12:00 Noon) or if you have a scheduled appointment with [place], prior to your surgery, and ask for it by name. In addition, you will  need to provide [place] with your name, name of your surgeon, type of surgery, and date of procedure or surgery.  _____________________________________________________________________________________________  Pain Score  Introduction: The pain score used by this practice is the Verbal Numerical Rating Scale (VNRS-11). This is an 11-point scale. It is for adults and children 10 years or older. There are significant differences in how the pain score is reported, used, and applied. Forget everything you learned in the past and learn this scoring system.  General Information: The scale should reflect your current level of pain. Unless you are specifically asked for the level of your worst pain, or your average pain. If you are asked for one of these two, then it should be understood that it is over the past 24 hours.  Basic Activities of Daily Living (ADL): Personal hygiene, dressing, eating, transferring, and using restroom.  Instructions: Most patients tend to report their level of pain as a combination of two factors, their physical pain and their psychosocial pain. This last one is also known as "suffering" and it is reflection of how physical pain affects you socially and psychologically. From now on, report them separately. From this point on, when asked to report your pain level, report only your physical pain. Use the following table for reference.  Pain Clinic Pain Levels (0-5/10)  Pain Level Score Description  No Pain 0   Mild pain 1 Nagging, annoying, but does not interfere with basic activities of daily living (ADL). Patients are able to eat, bathe, get dressed, toileting (being able to get on and off the toilet and perform personal hygiene functions), transfer (move in and out of bed or a chair without assistance), and maintain continence (able to control  bladder and bowel functions). Blood pressure and heart rate are unaffected. A normal heart rate for a healthy adult ranges from 60 to 100 bpm (beats per minute).   Mild to moderate pain 2 Noticeable and distracting. Impossible to hide from other people. More frequent flare-ups. Still possible to adapt and function close to normal. It can be very annoying and may have occasional stronger flare-ups. With discipline, patients may get used to it and adapt.   Moderate pain 3 Interferes significantly with activities of daily living (ADL). It becomes difficult to feed, bathe, get dressed, get on and off the toilet or to perform personal hygiene functions. Difficult to get in and out of bed or a chair without assistance. Very distracting. With effort, it can be ignored when deeply involved in activities.   Moderately severe pain 4 Impossible to ignore for more than a few minutes. With effort, patients may still be able to manage work or participate in some social activities. Very difficult to concentrate. Signs of autonomic nervous system discharge are evident: dilated pupils (mydriasis); mild sweating (diaphoresis); sleep interference. Heart rate becomes elevated (>115 bpm). Diastolic blood pressure (lower number) rises above 100 mmHg. Patients find relief in laying down and not moving.   Severe pain 5 Intense and extremely unpleasant. Associated with frowning face and frequent crying. Pain overwhelms the senses.  Ability to do any activity or maintain social relationships becomes significantly limited. Conversation becomes difficult. Pacing back and forth is common, as getting into a comfortable position is nearly impossible. Pain wakes you up from deep sleep. Physical signs will be obvious: pupillary dilation; increased sweating; goosebumps; brisk reflexes; cold, clammy hands and feet; nausea, vomiting or dry heaves; loss of appetite; significant sleep disturbance with inability to fall asleep or to remain  asleep. When persistent, significant weight loss is observed due to the complete loss of appetite and sleep deprivation.  Blood pressure and heart rate becomes significantly elevated. Caution: If elevated blood pressure triggers a pounding headache associated with blurred vision, then the patient should immediately seek attention at an urgent or emergency care unit, as these may be signs of an impending stroke.    Emergency Department Pain Levels (6-10/10)  Emergency Room Pain 6 Severely limiting. Requires emergency care and should not be seen or managed at an outpatient pain management facility. Communication becomes difficult and requires great effort. Assistance to reach the emergency department may be required. Facial flushing and profuse sweating along with potentially dangerous increases in heart rate and blood pressure will be evident.   Distressing pain 7 Self-care is very difficult. Assistance is required to transport, or use restroom. Assistance to reach the emergency department will be required. Tasks requiring coordination, such as bathing and getting dressed become very difficult.   Disabling pain 8 Self-care is no longer possible. At this level, pain is disabling. The individual is unable to do even the most "basic" activities such as walking, eating, bathing, dressing, transferring to a bed, or toileting. Fine motor skills are lost. It is difficult to think clearly.   Incapacitating pain 9 Pain becomes incapacitating. Thought processing is no longer possible. Difficult to remember your own name. Control of movement and coordination are lost.   The worst pain imaginable 10 At this level, most patients pass out from pain. When this level is reached, collapse of the autonomic nervous system occurs, leading to a sudden drop in blood pressure and heart rate. This in turn results in a temporary and dramatic drop in blood flow to the brain, leading to a loss of consciousness. Fainting is one of  the body's self defense mechanisms. Passing out puts the brain in a calmed state and causes it to shut down for a while, in order to begin the healing process.    Summary: 1. Refer to this scale when providing [place] with your pain level. 2. Be accurate and careful when reporting your pain level. This will help with your care. 3. Over-reporting your pain level will lead to loss of credibility. 4. Even a level of 1/10 means that there is pain and will be treated at our facility. 5. High, inaccurate reporting will be documented as "Symptom Exaggeration", leading to loss of credibility and suspicions of possible secondary gains such as obtaining more narcotics, or wanting to appear disabled, for fraudulent reasons. 6. Only pain levels of 5 or below will be seen at our facility. 7. Pain levels of 6 and above will be sent to the Emergency Department and the appointment cancelled. _____________________________________________________________________________________________

## 2017-02-23 ENCOUNTER — Ambulatory Visit: Payer: Medicare Other | Attending: Nurse Practitioner | Admitting: Nurse Practitioner

## 2017-02-23 ENCOUNTER — Encounter: Payer: Self-pay | Admitting: Nurse Practitioner

## 2017-02-23 VITALS — BP 142/92 | HR 77 | Temp 98.0°F | Resp 16 | Ht 68.5 in | Wt 159.0 lb

## 2017-02-23 DIAGNOSIS — K7460 Unspecified cirrhosis of liver: Secondary | ICD-10-CM | POA: Diagnosis not present

## 2017-02-23 DIAGNOSIS — M488X6 Other specified spondylopathies, lumbar region: Secondary | ICD-10-CM | POA: Insufficient documentation

## 2017-02-23 DIAGNOSIS — Z5181 Encounter for therapeutic drug level monitoring: Secondary | ICD-10-CM | POA: Insufficient documentation

## 2017-02-23 DIAGNOSIS — M47816 Spondylosis without myelopathy or radiculopathy, lumbar region: Secondary | ICD-10-CM

## 2017-02-23 DIAGNOSIS — I10 Essential (primary) hypertension: Secondary | ICD-10-CM | POA: Diagnosis not present

## 2017-02-23 DIAGNOSIS — M4696 Unspecified inflammatory spondylopathy, lumbar region: Secondary | ICD-10-CM | POA: Diagnosis not present

## 2017-02-23 DIAGNOSIS — Z7982 Long term (current) use of aspirin: Secondary | ICD-10-CM | POA: Insufficient documentation

## 2017-02-23 DIAGNOSIS — M5116 Intervertebral disc disorders with radiculopathy, lumbar region: Secondary | ICD-10-CM | POA: Insufficient documentation

## 2017-02-23 DIAGNOSIS — M792 Neuralgia and neuritis, unspecified: Secondary | ICD-10-CM | POA: Diagnosis not present

## 2017-02-23 DIAGNOSIS — F419 Anxiety disorder, unspecified: Secondary | ICD-10-CM | POA: Diagnosis not present

## 2017-02-23 DIAGNOSIS — Z79891 Long term (current) use of opiate analgesic: Secondary | ICD-10-CM | POA: Diagnosis not present

## 2017-02-23 DIAGNOSIS — R200 Anesthesia of skin: Secondary | ICD-10-CM | POA: Insufficient documentation

## 2017-02-23 DIAGNOSIS — M4726 Other spondylosis with radiculopathy, lumbar region: Secondary | ICD-10-CM

## 2017-02-23 DIAGNOSIS — K5900 Constipation, unspecified: Secondary | ICD-10-CM | POA: Diagnosis not present

## 2017-02-23 DIAGNOSIS — M170 Bilateral primary osteoarthritis of knee: Secondary | ICD-10-CM | POA: Insufficient documentation

## 2017-02-23 DIAGNOSIS — M25551 Pain in right hip: Secondary | ICD-10-CM | POA: Diagnosis not present

## 2017-02-23 DIAGNOSIS — M5136 Other intervertebral disc degeneration, lumbar region: Secondary | ICD-10-CM

## 2017-02-23 DIAGNOSIS — Z794 Long term (current) use of insulin: Secondary | ICD-10-CM | POA: Insufficient documentation

## 2017-02-23 DIAGNOSIS — Z7952 Long term (current) use of systemic steroids: Secondary | ICD-10-CM | POA: Insufficient documentation

## 2017-02-23 DIAGNOSIS — E119 Type 2 diabetes mellitus without complications: Secondary | ICD-10-CM | POA: Diagnosis not present

## 2017-02-23 DIAGNOSIS — M25552 Pain in left hip: Secondary | ICD-10-CM | POA: Diagnosis not present

## 2017-02-23 DIAGNOSIS — F329 Major depressive disorder, single episode, unspecified: Secondary | ICD-10-CM | POA: Insufficient documentation

## 2017-02-23 DIAGNOSIS — M12519 Traumatic arthropathy, unspecified shoulder: Secondary | ICD-10-CM | POA: Insufficient documentation

## 2017-02-23 DIAGNOSIS — Z79899 Other long term (current) drug therapy: Secondary | ICD-10-CM | POA: Insufficient documentation

## 2017-02-23 DIAGNOSIS — G894 Chronic pain syndrome: Secondary | ICD-10-CM | POA: Insufficient documentation

## 2017-02-23 DIAGNOSIS — Z9181 History of falling: Secondary | ICD-10-CM | POA: Diagnosis not present

## 2017-02-23 DIAGNOSIS — M549 Dorsalgia, unspecified: Secondary | ICD-10-CM | POA: Diagnosis present

## 2017-02-23 DIAGNOSIS — D860 Sarcoidosis of lung: Secondary | ICD-10-CM | POA: Insufficient documentation

## 2017-02-23 DIAGNOSIS — E559 Vitamin D deficiency, unspecified: Secondary | ICD-10-CM | POA: Insufficient documentation

## 2017-02-23 MED ORDER — OXYCODONE-ACETAMINOPHEN 10-325 MG PO TABS: 1.0000 | ORAL_TABLET | Freq: Four times a day (QID) | ORAL | 0 refills | Status: DC | PRN

## 2017-02-23 MED ORDER — GABAPENTIN 600 MG PO TABS: 600.0000 mg | ORAL_TABLET | Freq: Three times a day (TID) | ORAL | 0 refills | Status: DC

## 2017-02-23 NOTE — Progress Notes (Signed)
Nursing Pain Medication Assessment:  Safety precautions to be maintained throughout the outpatient stay will include: orient to surroundings, keep bed in low position, maintain call bell within reach at all times, provide assistance with transfer out of bed and ambulation.  Medication Inspection Compliance: Pill count conducted under aseptic conditions, in front of the patient. Neither the pills nor the bottle was removed from the patient's sight at any time. Once count was completed pills were immediately returned to the patient in their original bottle.  Medication: See above Pill/Patch Count: 26 of 120 pills remain Pill/Patch Appearance: Markings consistent with prescribed medication Bottle Appearance: Standard pharmacy container. Clearly labeled. Filled Date: 4 / 626 / 2018 Last Medication intake:  Today

## 2017-02-23 NOTE — Patient Instructions (Addendum)
____________________________________________________________________________________________  Medication Rules  Applies to: All patients receiving prescriptions (written or electronic).  Pharmacy of record: Pharmacy where electronic prescriptions will be sent. If written prescriptions are taken to a different pharmacy, please inform the nursing staff. The pharmacy listed in the electronic medical record should be the one where you would like electronic prescriptions to be sent.  Prescription refills: Only during scheduled appointments. Applies to both, written and electronic prescriptions.  NOTE: The following applies primarily to controlled substances (Opioid Pain Medications)  Patient's responsibilities: 1. Pain Pills: Bring all pain pills to every appointment (except for procedure appointments). 2. Pill Bottles: Bring pills in original pharmacy bottle. Always bring newest bottle. Bring bottle, even if empty. 3. Medication refills: You are responsible for knowing and keeping track of what medications you need refilled. The day before your appointment, write a list of all prescriptions that need to be refilled. Bring that list to your appointment and give it to the admitting nurse. Prescriptions will be written only during appointments. If you forget a medication, it will not be "Called in", "Faxed", or "electronically sent". You will need to get another appointment to get these prescribed. 4. Prescription Accuracy: You are responsible for carefully inspecting your prescriptions before leaving our office. Have the discharge nurse carefully go over each prescription with you, before taking them home. Make sure that your name is accurately spelled, that your address is correct. Check the name and dose of your medication to make sure it is accurate. Check the number of pills, and the written instructions to make sure they are clear and accurate. Make sure that you are given enough medication to last  until your next medication refill appointment. 5. Taking Medication: Take medication as prescribed. Never take more pills than instructed. Never take medication more frequently than prescribed. Taking less pills or less frequently is permitted and encouraged, when it comes to controlled substances (written prescriptions).  6. Inform other Doctors: Always inform, all of your healthcare providers, of all the medications you take. 7. Pain Medication from other Providers: You are not allowed to accept any additional pain medication from any other Doctor or Healthcare provider. There are two exceptions to this rule. (see below) In the event that you require additional pain medication, you are responsible for notifying us, as stated below. 8. Medication Agreement: You are responsible for carefully reading and following our Medication Agreement. This must be signed before receiving any prescriptions from our practice. Safely store a copy of your signed Agreement. Violations to the Agreement will result in no further prescriptions. (Additional copies of our Medication Agreement are available upon request.) 9. Laws, Rules, & Regulations: All patients are expected to follow all Federal and State Laws, Statutes, Rules, & Regulations. Ignorance of the Laws does not constitute a valid excuse.  Exceptions: There are only two exceptions to the rule of not receiving pain medications from other Healthcare Providers. 1. Exception #1 (Emergencies): In the event of an emergency (i.e.: accident requiring emergency care), you are allowed to receive additional pain medication. However, you are responsible for: As soon as you are able, call our office [phone], at any time of the day or night, and leave a message stating your name, the date and nature of the emergency, and the name and dose of the medication prescribed. In the event that your call is answered by a member of our staff, make sure to document and save the date,  time, and the name of the person that   took your information.  2. Exception #2 (Planned Surgery): In the event that you are scheduled by another doctor or dentist to have any type of surgery or procedure, you are allowed (for a period no longer than 30 days), to receive additional pain medication, for the acute post-op pain. However, in this case, you are responsible for picking up a copy of our "Post-op Pain Management for Surgeons" handout, and giving it to your surgeon or dentist. This document is available at our office, and does not require an appointment to obtain it. Simply go to our office during business hours (Monday-Thursday from 8:00 AM to 4:00 PM) (Friday 8:00 AM to 12:00 Noon) or if you have a scheduled appointment with [place], prior to your surgery, and ask for it by name. In addition, you will need to provide [place] with your name, name of your surgeon, type of surgery, and date of procedure or surgery.  _____________________________________________________________________________________________  painPain Score  Introduction: The pain score used by this practice is the Verbal Numerical Rating Scale (VNRS-11). This is an 11-point scale. It is for adults and children 10 years or older. There are significant differences in how the pain score is reported, used, and applied. Forget everything you learned in the past and learn this scoring system.  General Information: The scale should reflect your current level of pain. Unless you are specifically asked for the level of your worst pain, or your average pain. If you are asked for one of these two, then it should be understood that it is over the past 24 hours.  Basic Activities of Daily Living (ADL): Personal hygiene, dressing, eating, transferring, and using restroom.  Instructions: Most patients tend to report their level of pain as a combination of two factors, their physical pain and their psychosocial pain. This last one is also known as  "suffering" and it is reflection of how physical pain affects you socially and psychologically. From now on, report them separately. From this point on, when asked to report your pain level, report only your physical pain. Use the following table for reference.  Pain Clinic Pain Levels (0-5/10)  Pain Level Score Description  No Pain 0   Mild pain 1 Nagging, annoying, but does not interfere with basic activities of daily living (ADL). Patients are able to eat, bathe, get dressed, toileting (being able to get on and off the toilet and perform personal hygiene functions), transfer (move in and out of bed or a chair without assistance), and maintain continence (able to control bladder and bowel functions). Blood pressure and heart rate are unaffected. A normal heart rate for a healthy adult ranges from 60 to 100 bpm (beats per minute).   Mild to moderate pain 2 Noticeable and distracting. Impossible to hide from other people. More frequent flare-ups. Still possible to adapt and function close to normal. It can be very annoying and may have occasional stronger flare-ups. With discipline, patients may get used to it and adapt.   Moderate pain 3 Interferes significantly with activities of daily living (ADL). It becomes difficult to feed, bathe, get dressed, get on and off the toilet or to perform personal hygiene functions. Difficult to get in and out of bed or a chair without assistance. Very distracting. With effort, it can be ignored when deeply involved in activities.   Moderately severe pain 4 Impossible to ignore for more than a few minutes. With effort, patients may still be able to manage work or participate in some social activities.  Very difficult to concentrate. Signs of autonomic nervous system discharge are evident: dilated pupils (mydriasis); mild sweating (diaphoresis); sleep interference. Heart rate becomes elevated (>115 bpm). Diastolic blood pressure (lower number) rises above 100 mmHg.  Patients find relief in laying down and not moving.   Severe pain 5 Intense and extremely unpleasant. Associated with frowning face and frequent crying. Pain overwhelms the senses.  Ability to do any activity or maintain social relationships becomes significantly limited. Conversation becomes difficult. Pacing back and forth is common, as getting into a comfortable position is nearly impossible. Pain wakes you up from deep sleep. Physical signs will be obvious: pupillary dilation; increased sweating; goosebumps; brisk reflexes; cold, clammy hands and feet; nausea, vomiting or dry heaves; loss of appetite; significant sleep disturbance with inability to fall asleep or to remain asleep. When persistent, significant weight loss is observed due to the complete loss of appetite and sleep deprivation.  Blood pressure and heart rate becomes significantly elevated. Caution: If elevated blood pressure triggers a pounding headache associated with blurred vision, then the patient should immediately seek attention at an urgent or emergency care unit, as these may be signs of an impending stroke.    Emergency Department Pain Levels (6-10/10)  Emergency Room Pain 6 Severely limiting. Requires emergency care and should not be seen or managed at an outpatient pain management facility. Communication becomes difficult and requires great effort. Assistance to reach the emergency department may be required. Facial flushing and profuse sweating along with potentially dangerous increases in heart rate and blood pressure will be evident.   Distressing pain 7 Self-care is very difficult. Assistance is required to transport, or use restroom. Assistance to reach the emergency department will be required. Tasks requiring coordination, such as bathing and getting dressed become very difficult.   Disabling pain 8 Self-care is no longer possible. At this level, pain is disabling. The individual is unable to do even the most "basic"  activities such as walking, eating, bathing, dressing, transferring to a bed, or toileting. Fine motor skills are lost. It is difficult to think clearly.   Incapacitating pain 9 Pain becomes incapacitating. Thought processing is no longer possible. Difficult to remember your own name. Control of movement and coordination are lost.   The worst pain imaginable 10 At this level, most patients pass out from pain. When this level is reached, collapse of the autonomic nervous system occurs, leading to a sudden drop in blood pressure and heart rate. This in turn results in a temporary and dramatic drop in blood flow to the brain, leading to a loss of consciousness. Fainting is one of the body's self defense mechanisms. Passing out puts the brain in a calmed state and causes it to shut down for a while, in order to begin the healing process.    Summary: 1. Refer to this scale when providing [place] with your pain level. 2. Be accurate and careful when reporting your pain level. This will help with your care. 3. Over-reporting your pain level will lead to loss of credibility. 4. Even a level of 1/10 means that there is pain and will be treated at our facility. 5. High, inaccurate reporting will be documented as "Symptom Exaggeration", leading to loss of credibility and suspicions of possible secondary gains such as obtaining more narcotics, or wanting to appear disabled, for fraudulent reasons. 6. Only pain levels of 5 or below will be seen at our facility. 7. Pain levels of 6 and above will be sent to the  Emergency Department and the appointment cancelled. _____________________________________________________________________________________________  Pain Management Discharge Instructions  General Discharge Instructions :  If you need to reach your doctor call: Monday-Friday 8:00 am - 4:00 pm at [phone] or toll free [phone].  After clinic hours [phone] to have operator reach doctor.  Bring  all of your medication bottles to all your appointments in the pain clinic.  To cancel or reschedule your appointment with Pain Management please remember to call 24 hours in advance to avoid a fee.  Refer to the educational materials which you have been given on: General Risks, I had my Procedure. Discharge Instructions, Post Sedation.  Post Procedure Instructions:  The drugs you were given will stay in your system until tomorrow, so for the next 24 hours you should not drive, make any legal decisions or drink any alcoholic beverages.  You may eat anything you prefer, but it is better to start with liquids then soups and crackers, and gradually work up to solid foods.  Please notify your doctor immediately if you have any unusual bleeding, trouble breathing or pain that is not related to your normal pain.  Depending on the type of procedure that was done, some parts of your body may feel week and/or numb.  This usually clears up by tonight or the next day.  Walk with the use of an assistive device or accompanied by an adult for the 24 hours.  You may use ice on the affected area for the first 24 hours.  Put ice in a Ziploc bag and cover with a towel and place against area 15 minutes on 15 minutes off.  You may switch to heat after 24 hours.

## 2017-02-28 DIAGNOSIS — K529 Noninfective gastroenteritis and colitis, unspecified: Secondary | ICD-10-CM | POA: Insufficient documentation

## 2017-03-14 DIAGNOSIS — R5383 Other fatigue: Secondary | ICD-10-CM | POA: Insufficient documentation

## 2017-03-21 ENCOUNTER — Other Ambulatory Visit: Payer: Self-pay | Admitting: *Deleted

## 2017-03-21 ENCOUNTER — Telehealth: Payer: Self-pay | Admitting: Pain Medicine

## 2017-03-21 NOTE — Telephone Encounter (Signed)
Patient having increased back pain, would like to come in for procedure, just need to know if it will be facet or epid. Please call patient and let us know.

## 2017-03-21 NOTE — Telephone Encounter (Signed)
No he doesn't need prior auth

## 2017-03-21 NOTE — Telephone Encounter (Signed)
Spoke to Douglas Andrews re; his pain in his back.  States that is specific to his lower back and would like a facet block.  Patient does not take sedation for his procedures.

## 2017-03-22 ENCOUNTER — Encounter: Payer: Self-pay | Admitting: Pain Medicine

## 2017-03-22 ENCOUNTER — Ambulatory Visit (HOSPITAL_BASED_OUTPATIENT_CLINIC_OR_DEPARTMENT_OTHER): Payer: Medicare Other | Admitting: Pain Medicine

## 2017-03-22 ENCOUNTER — Ambulatory Visit
Admission: RE | Admit: 2017-03-22 | Discharge: 2017-03-22 | Disposition: A | Discharge status: Home / Self Care | Payer: Medicare Other | Source: Ambulatory Visit | Attending: Pain Medicine | Admitting: Pain Medicine

## 2017-03-22 VITALS — BP 148/98 | HR 72 | Temp 97.8°F | Resp 17 | Ht 68.0 in | Wt 166.0 lb

## 2017-03-22 DIAGNOSIS — G8929 Other chronic pain: Secondary | ICD-10-CM | POA: Insufficient documentation

## 2017-03-22 DIAGNOSIS — M4696 Unspecified inflammatory spondylopathy, lumbar region: Secondary | ICD-10-CM | POA: Diagnosis not present

## 2017-03-22 DIAGNOSIS — M5136 Other intervertebral disc degeneration, lumbar region: Secondary | ICD-10-CM

## 2017-03-22 DIAGNOSIS — M5441 Lumbago with sciatica, right side: Secondary | ICD-10-CM

## 2017-03-22 DIAGNOSIS — M545 Low back pain: Secondary | ICD-10-CM | POA: Diagnosis present

## 2017-03-22 DIAGNOSIS — M47816 Spondylosis without myelopathy or radiculopathy, lumbar region: Secondary | ICD-10-CM

## 2017-03-22 MED ORDER — ROPIVACAINE HCL 2 MG/ML IJ SOLN
INTRAMUSCULAR | Status: AC
  Filled 2017-03-22: qty 20

## 2017-03-22 MED ORDER — LIDOCAINE HCL (PF) 1.5 % IJ SOLN
20.0000 mL | Freq: Once | INTRAMUSCULAR | Status: AC
  Administered 2017-03-22: 20 mL

## 2017-03-22 MED ORDER — ROPIVACAINE HCL 2 MG/ML IJ SOLN
9.0000 mL | Freq: Once | INTRAMUSCULAR | Status: AC
  Administered 2017-03-22: 10 mL via PERINEURAL

## 2017-03-22 MED ORDER — TRIAMCINOLONE ACETONIDE 40 MG/ML IJ SUSP
INTRAMUSCULAR | Status: AC
  Filled 2017-03-22: qty 2

## 2017-03-22 MED ORDER — MIDAZOLAM HCL 5 MG/5ML IJ SOLN: 1.0000 mg | INTRAMUSCULAR | Status: DC | PRN

## 2017-03-22 MED ORDER — TRIAMCINOLONE ACETONIDE 40 MG/ML IJ SUSP
40.0000 mg | Freq: Once | INTRAMUSCULAR | Status: AC
  Administered 2017-03-22: 40 mg

## 2017-03-22 MED ORDER — FENTANYL CITRATE (PF) 100 MCG/2ML IJ SOLN: 25.0000 ug | INTRAMUSCULAR | Status: DC | PRN

## 2017-03-22 MED ORDER — LIDOCAINE HCL (PF) 1.5 % IJ SOLN
20.0000 mL | Freq: Once | INTRAMUSCULAR | Status: DC
  Administered 2017-03-22: 20 mL

## 2017-03-22 MED ORDER — LACTATED RINGERS IV SOLN: 1000.0000 mL | Freq: Once | INTRAVENOUS | Status: DC

## 2017-03-22 NOTE — Patient Instructions (Addendum)
____________________________________________________________________________________________  Post-Procedure instructions Instructions:  Apply ice: Fill a plastic sandwich bag with crushed ice. Cover it with a small towel and apply to injection site. Apply for 15 minutes then remove x 15 minutes. Repeat sequence on day of procedure, until you go to bed. The purpose is to minimize swelling and discomfort after procedure.  Apply heat: Apply heat to procedure site starting the day following the procedure. The purpose is to treat any soreness and discomfort from the procedure.  Food intake: Start with clear liquids (like water) and advance to regular food, as tolerated.   Physical activities: Keep activities to a minimum for the first 8 hours after the procedure.   Driving: If you have received any sedation, you are not allowed to drive for 24 hours after your procedure.  Blood thinner: Restart your blood thinner 6 hours after your procedure. (Only for those taking blood thinners)  Insulin: As soon as you can eat, you may resume your normal dosing schedule. (Only for those taking insulin)  Infection prevention: Keep procedure site clean and dry.  Post-procedure Pain Diary: Extremely important that this be done correctly and accurately. Recorded information will be used to determine the next step in treatment.  Pain evaluated is that of treated area only. Do not include pain from an untreated area.  Complete every hour, on the hour, for the initial 8 hours. Set an alarm to help you do this part accurately.  Do not go to sleep and have it completed later. It will not be accurate.  Follow-up appointment: Keep your follow-up appointment after the procedure. Usually 2 weeks for most procedures. (6 weeks in the case of radiofrequency.) Bring you pain diary.  Expect:  From numbing medicine (AKA: Local Anesthetics): Numbness or decrease in pain.  Onset: Full effect within 15 minutes of  injected.  Duration: It will depend on the type of local anesthetic used. On the average, 1 to 8 hours.   From steroids: Decrease in swelling or inflammation. Once inflammation is improved, relief of the pain will follow.  Onset of benefits: Depends on the amount of swelling present. The more swelling, the longer it will take for the benefits to be seen.   Duration: Steroids will stay in the system x 2 weeks. Duration of benefits will depend on multiple posibilities including persistent irritating factors.  From procedure: Some discomfort is to be expected once the numbing medicine wears off. This should be minimal if ice and heat are applied as instructed. Call if:  You experience numbness and weakness that gets worse with time, as opposed to wearing off.  New onset bowel or bladder incontinence. (Spinal procedures only)  Emergency Numbers:  Durning business hours (Monday - Thursday, 8:00 AM - 4:00 PM) (Friday, 9:00 AM - 12:00 Noon): [phone]  After hours: [phone] ____________________________________________________________________________________________  ____________________________________________________________________________________________  Pain Prevention Technique  Definition:   A technique used to minimize the effects of an activity known to cause inflammation or swelling, which in turn leads to an increase in pain.  Purpose: To prevent swelling from occurring. It is based on the fact that it is easier to prevent swelling from happening than it is to get rid of it, once it occurs.  Contraindications: 1. Anyone with allergy or hypersensitivity to the recommended medications. 2. Anyone taking anticoagulants (Blood Thinners) (e.g., Coumadin, Warfarin, Plavix, etc.). 3. Patients in Renal Failure.  Technique: Before you undertake an activity known to cause pain, or a flare-up of your chronic pain,  and before you experience any pain, do the  following:  1. On a full stomach, take 4 (four) over the counter Ibuprofens 200mg  tablets (Motrin), for a total of 800 mg. 2. In addition, take over the counter Magnesium 400 to 500 mg, before doing the activity.  3. Six (6) hours later, again on a full stomach, repeat the Ibuprofen. 4. That night, take a warm shower and stretch under the running warm water.  This technique may be sufficient to abort the pain and discomfort before it happens. Keep in mind that it takes a lot less medication to prevent swelling than it takes to eliminate it once it occurs.  ____________________________________________________________________________________________  Pain Management Discharge Instructions  General Discharge Instructions :  If you need to reach your doctor call: Monday-Friday 8:00 am - 4:00 pm at [phone] or toll free [phone].  After clinic hours [phone] to have operator reach doctor.  Bring all of your medication bottles to all your appointments in the pain clinic.  To cancel or reschedule your appointment with Pain Management please remember to call 24 hours in advance to avoid a fee.  Refer to the educational materials which you have been given on: General Risks, I had my Procedure. Discharge Instructions, Post Sedation.  Post Procedure Instructions:  The drugs you were given will stay in your system until tomorrow, so for the next 24 hours you should not drive, make any legal decisions or drink any alcoholic beverages.  You may eat anything you prefer, but it is better to start with liquids then soups and crackers, and gradually work up to solid foods.  Please notify your doctor immediately if you have any unusual bleeding, trouble breathing or pain that is not related to your normal pain.  Depending on the type of procedure that was done, some parts of your body may feel week and/or numb.  This usually clears up by tonight or the next day.  Walk with the use of an  assistive device or accompanied by an adult for the 24 hours.  You may use ice on the affected area for the first 24 hours.  Put ice in a Ziploc bag and cover with a towel and place against area 15 minutes on 15 minutes off.  You may switch to heat after 24 hours.GENERAL RISKS AND COMPLICATIONS  What are the risk, side effects and possible complications? Generally speaking, most procedures are safe.  However, with any procedure there are risks, side effects, and the possibility of complications.  The risks and complications are dependent upon the sites that are lesioned, or the type of nerve block to be performed.  The closer the procedure is to the spine, the more serious the risks are.  Great care is taken when placing the radio frequency needles, block needles or lesioning probes, but sometimes complications can occur. 1. Infection: Any time there is an injection through the skin, there is a risk of infection.  This is why sterile conditions are used for these blocks.  There are four possible types of infection. 1. Localized skin infection. 2. Central Nervous System Infection-This can be in the form of Meningitis, which can be deadly. 3. Epidural Infections-This can be in the form of an epidural abscess, which can cause pressure inside of the spine, causing compression of the spinal cord with subsequent paralysis. This would require an emergency surgery to decompress, and there are no guarantees that the patient would recover from the paralysis. 4. Discitis-This is an infection of  the intervertebral discs.  It occurs in about 1% of discography procedures.  It is difficult to treat and it may lead to surgery.        2. Pain: the needles have to go through skin and soft tissues, will cause soreness.       3. Damage to internal structures:  The nerves to be lesioned may be near blood vessels or    other nerves which can be potentially damaged.       4. Bleeding: Bleeding is more common if the patient  is taking blood thinners such as  aspirin, Coumadin, Ticiid, Plavix, etc., or if he/she have some genetic predisposition  such as hemophilia. Bleeding into the spinal canal can cause compression of the spinal  cord with subsequent paralysis.  This would require an emergency surgery to  decompress and there are no guarantees that the patient would recover from the  paralysis.       5. Pneumothorax:  Puncturing of a lung is a possibility, every time a needle is introduced in  the area of the chest or upper back.  Pneumothorax refers to free air around the  collapsed lung(s), inside of the thoracic cavity (chest cavity).  Another two possible  complications related to a similar event would include: Hemothorax and Chylothorax.   These are variations of the Pneumothorax, where instead of air around the collapsed  lung(s), you may have blood or chyle, respectively.       6. Spinal headaches: They may occur with any procedures in the area of the spine.       7. Persistent CSF (Cerebro-Spinal Fluid) leakage: This is a rare problem, but may occur  with prolonged intrathecal or epidural catheters either due to the formation of a fistulous  track or a dural tear.       8. Nerve damage: By working so close to the spinal cord, there is always a possibility of  nerve damage, which could be as serious as a permanent spinal cord injury with  paralysis.       9. Death:  Although rare, severe deadly allergic reactions known as "Anaphylactic  reaction" can occur to any of the medications used.      10. Worsening of the symptoms:  We can always make thing worse.  What are the chances of something like this happening? Chances of any of this occuring are extremely low.  By statistics, you have more of a chance of getting killed in a motor vehicle accident: while driving to the hospital than any of the above occurring .  Nevertheless, you should be aware that they are possibilities.  In general, it is similar to taking a shower.   Everybody knows that you can slip, hit your head and get killed.  Does that mean that you should not shower again?  Nevertheless always keep in mind that statistics do not mean anything if you happen to be on the wrong side of them.  Even if a procedure has a 1 (one) in a 1,000,000 (million) chance of going wrong, it you happen to be that one..Also, keep in mind that by statistics, you have more of a chance of having something go wrong when taking medications.  Who should not have this procedure? If you are on a blood thinning medication (e.g. Coumadin, Plavix, see list of "Blood Thinners"), or if you have an active infection going on, you should not have the procedure.  If you are taking any blood thinners,  please inform your physician.  How should I prepare for this procedure?  Do not eat or drink anything at least six hours prior to the procedure.  Bring a driver with you .  It cannot be a taxi.  Come accompanied by an adult that can drive you back, and that is strong enough to help you if your legs get weak or numb from the local anesthetic.  Take all of your medicines the morning of the procedure with just enough water to swallow them.  If you have diabetes, make sure that you are scheduled to have your procedure done first thing in the morning, whenever possible.  If you have diabetes, take only half of your insulin dose and notify our nurse that you have done so as soon as you arrive at the clinic.  If you are diabetic, but only take blood sugar pills (oral hypoglycemic), then do not take them on the morning of your procedure.  You may take them after you have had the procedure.  Do not take aspirin or any aspirin-containing medications, at least eleven (11) days prior to the procedure.  They may prolong bleeding.  Wear loose fitting clothing that may be easy to take off and that you would not mind if it got stained with Betadine or blood.  Do not wear any jewelry or perfume  Remove  any nail coloring.  It will interfere with some of our monitoring equipment.  NOTE: Remember that this is not meant to be interpreted as a complete list of all possible complications.  Unforeseen problems may occur.  BLOOD THINNERS The following drugs contain aspirin or other products, which can cause increased bleeding during surgery and should not be taken for 2 weeks prior to and 1 week after surgery.  If you should need take something for relief of minor pain, you may take acetaminophen which is found in Tylenol,m Datril, Anacin-3 and Panadol. It is not blood thinner. The products listed below are.  Do not take any of the products listed below in addition to any listed on your instruction sheet.  A.P.C or A.P.C with Codeine Codeine Phosphate Capsules #3 Ibuprofen Ridaura  ABC compound Congesprin Imuran rimadil  Advil Cope Indocin Robaxisal  Alka-Seltzer Effervescent Pain Reliever and Antacid Coricidin or Coricidin-D  Indomethacin Rufen  Alka-Seltzer plus Cold Medicine Cosprin Ketoprofen S-A-C Tablets  Anacin Analgesic Tablets or Capsules Coumadin Korlgesic Salflex  Anacin Extra Strength Analgesic tablets or capsules CP-2 Tablets Lanoril Salicylate  Anaprox Cuprimine Capsules Levenox Salocol  Anexsia-D Dalteparin Magan Salsalate  Anodynos Darvon compound Magnesium Salicylate Sine-off  Ansaid Dasin Capsules Magsal Sodium Salicylate  Anturane Depen Capsules Marnal Soma  APF Arthritis pain formula Dewitt's Pills Measurin Stanback  Argesic Dia-Gesic Meclofenamic Sulfinpyrazone  Arthritis Bayer Timed Release Aspirin Diclofenac Meclomen Sulindac  Arthritis pain formula Anacin Dicumarol Medipren Supac  Analgesic (Safety coated) Arthralgen Diffunasal Mefanamic Suprofen  Arthritis Strength Bufferin Dihydrocodeine Mepro Compound Suprol  Arthropan liquid Dopirydamole Methcarbomol with Aspirin Synalgos  ASA tablets/Enseals Disalcid Micrainin Tagament  Ascriptin Doan's Midol Talwin  Ascriptin A/D  Dolene Mobidin Tanderil  Ascriptin Extra Strength Dolobid Moblgesic Ticlid  Ascriptin with Codeine Doloprin or Doloprin with Codeine Momentum Tolectin  Asperbuf Duoprin Mono-gesic Trendar  Aspergum Duradyne Motrin or Motrin IB Triminicin  Aspirin plain, buffered or enteric coated Durasal Myochrisine Trigesic  Aspirin Suppositories Easprin Nalfon Trillsate  Aspirin with Codeine Ecotrin Regular or Extra Strength Naprosyn Uracel  Atromid-S Efficin Naproxen Ursinus  Auranofin Capsules Elmiron Neocylate Vanquish  Axotal  Emagrin Norgesic Verin  Azathioprine Empirin or Empirin with Codeine Normiflo Vitamin E  Azolid Emprazil Nuprin Voltaren  Bayer Aspirin plain, buffered or children's or timed BC Tablets or powders Encaprin Orgaran Warfarin Sodium  Buff-a-Comp Enoxaparin Orudis Zorpin  Buff-a-Comp with Codeine Equegesic Os-Cal-Gesic   Buffaprin Excedrin plain, buffered or Extra Strength Oxalid   Bufferin Arthritis Strength Feldene Oxphenbutazone   Bufferin plain or Extra Strength Feldene Capsules Oxycodone with Aspirin   Bufferin with Codeine Fenoprofen Fenoprofen Pabalate or Pabalate-SF   Buffets II Flogesic Panagesic   Buffinol plain or Extra Strength Florinal or Florinal with Codeine Panwarfarin   Buf-Tabs Flurbiprofen Penicillamine   Butalbital Compound Four-way cold tablets Penicillin   Butazolidin Fragmin Pepto-Bismol   Carbenicillin Geminisyn Percodan   Carna Arthritis Reliever Geopen Persantine   Carprofen Gold's salt Persistin   Chloramphenicol Goody's Phenylbutazone   Chloromycetin Haltrain Piroxlcam   Clmetidine heparin Plaquenil   Cllnoril Hyco-pap Ponstel   Clofibrate Hydroxy chloroquine Propoxyphen         Before stopping any of these medications, be sure to consult the physician who ordered them.  Some, such as Coumadin (Warfarin) are ordered to prevent or treat serious conditions such as "deep thrombosis", "pumonary embolisms", and other heart problems.  The amount of time  that you may need off of the medication may also vary with the medication and the reason for which you were taking it.  If you are taking any of these medications, please make sure you notify your pain physician before you undergo any procedures.          Facet Joint Block, Care After Refer to this sheet in the next few weeks. These instructions provide you with information about caring for yourself after your procedure. Your health care provider may also give you more specific instructions. Your treatment has been planned according to current medical practices, but problems sometimes occur. Call your health care provider if you have any problems or questions after your procedure. What can I expect after the procedure? After the procedure, it is common to have:  Some tenderness over the injection sites for 2 days after the procedure.  A temporary increase in blood sugar if you have diabetes.  Follow these instructions at home:  Keep track of the amount of pain relief you feel and how long it lasts.  Take over-the-counter and prescription medicines only as told by your health care provider. You may need to limit pain medicine within the first 4-6 hours after the procedure.  Remove your bandages (dressings) the morning after the procedure.  For the first 24 hours after the procedure: ? Do not apply heat near or over the injection sites. ? Do not take a bath or soak in water, such as in a pool or lake. ? Do not drive or operate heavy machinery unless approved by your health care provider. ? Avoid activities that require a lot of energy.  If the injection site is tender, try applying ice to the area. To do this: ? Put ice in a plastic bag. ? Place a towel between your skin and the bag. ? Leave the ice on for 20 minutes, 2-3 times a day.  Keep all follow-up visits as told by your health care provider. This is important. Contact a health care provider if:  Fluid is coming from an  injection site.  There is significant bleeding or swelling at an injection site.  You have diabetes and your blood sugar is above 180 mg/dL. Get  help right away if:  You have a fever.  You have worsening pain or swelling around an injection site.  There are red streaks around an injection site.  You develop severe pain that is not controlled by your medicines.  You develop a headache, stiff neck, nausea, or vomiting.  Your eyes become very sensitive to light.  You have weakness, paralysis, or tingling in your arms or legs that was not present before the procedure.  You have difficulty urinating or breathing. This information is not intended to replace advice given to you by your health care provider. Make sure you discuss any questions you have with your health care provider. Document Released: 09/12/2012 Document Revised: 02/10/2016 Document Reviewed: 06/22/2015 Elsevier Interactive Patient Education  2018 [employer].  Facet Joint Block The facet joints connect the bones of the spine (vertebrae). They make it possible for you to bend, twist, and make other movements with your spine. They also keep you from bending too far, twisting too far, and making other excessive movements. A facet joint block is a procedure where a numbing medicine (anesthetic) is injected into a facet joint. Often, a type of anti-inflammatory medicine called a steroid is also injected. A facet joint block may be done to diagnose neck or back pain. If the pain gets better after a facet joint block, it means the pain is probably coming from the facet joint. If the pain does not get better, it means the pain is probably not coming from the facet joint. A facet joint block may also be done to relieve neck or back pain caused by an inflamed facet joint. A facet joint block is only done to relieve pain if the pain does not improve with other methods, such as medicine, exercise programs, and physical therapy. Tell a  health care provider about:  Any allergies you have.  All medicines you are taking, including vitamins, herbs, eye drops, creams, and over-the-counter medicines.  Any problems you or family members have had with anesthetic medicines.  Any blood disorders you have.  Any surgeries you have had.  Any medical conditions you have.  Whether you are pregnant or may be pregnant. What are the risks? Generally, this is a safe procedure. However, problems may occur, including:  Bleeding.  Injury to a nerve near the injection site.  Pain at the injection site.  Weakness or numbness in areas controlled by nerves near the injection site.  Infection.  Temporary fluid retention.  Allergic reactions to medicines or dyes.  Injury to other structures or organs near the injection site.  What happens before the procedure?  Follow instructions from your health care provider about eating or drinking restrictions.  Ask your health care provider about: ? Changing or stopping your regular medicines. This is especially important if you are taking diabetes medicines or blood thinners. ? Taking medicines such as aspirin and ibuprofen. These medicines can thin your blood. Do not take these medicines before your procedure if your health care provider instructs you not to.  Do not take any new dietary supplements or medicines without asking your health care provider first.  Plan to have someone take you home after the procedure. What happens during the procedure?  You may need to remove your clothing and dress in an open-back gown.  The procedure will be done while you are lying on an X-ray table. You will most likely be asked to lie on your stomach, but you may be asked to lie in a  different position if an injection will be made in your neck.  Machines will be used to monitor your oxygen levels, heart rate, and blood pressure.  If an injection will be made in your neck, an IV tube will be  inserted into one of your veins. Fluids and medicine will flow directly into your body through the IV tube.  The area over the facet joint where the injection will be made will be cleaned with soap. The surrounding skin will be covered with clean drapes.  A numbing medicine (local anesthetic) will be applied to your skin. Your skin may sting or burn for a moment.  A video X-ray machine (fluoroscopy) will be used to locate the joint. In some cases, a CT scan may be used.  A contrast dye may be injected into the facet joint area to help locate the joint.  When the joint is located, an anesthetic will be injected into the joint through the needle.  Your health care provider will ask you whether you feel pain relief. If you do feel relief, a steroid may be injected to provide pain relief for a longer period of time. If you do not feel relief or feel only partial relief, additional injections of an anesthetic may be made in other facet joints.  The needle will be removed.  Your skin will be cleaned.  A bandage (dressing) will be applied over each injection site. The procedure may vary among health care providers and hospitals. What happens after the procedure?  You will be observed for 15-30 minutes before being allowed to go home. This information is not intended to replace advice given to you by your health care provider. Make sure you discuss any questions you have with your health care provider. Document Released: 02/15/2007 Document Revised: 10/28/2015 Document Reviewed: 06/22/2015 Elsevier Interactive Patient Education  [employer].

## 2017-03-22 NOTE — Progress Notes (Signed)
Safety precautions to be maintained throughout the outpatient stay will include: orient to surroundings, keep bed in low position, maintain call bell within reach at all times, provide assistance with transfer out of bed and ambulation.  

## 2017-03-22 NOTE — Progress Notes (Addendum)
Patient's Name: Douglas Andrews  MRN: [id number]  Referring Provider: Katharine Look, MD  DOB: September 23, 1951  PCP: Katharine Look, MD  DOS: 03/22/2017  Note by: Sydnee Levans. Laban Emperor, MD  Service setting: Ambulatory outpatient  Location: [facility] (AMB) Pain Management Facility  Visit type: Procedure  Specialty: Interventional Pain Management  Patient type: Established   Primary Reason for Visit: Interventional Pain Management Treatment. CC: Back Pain (low and bilateral)  Procedure:  Anesthesia, Analgesia, Anxiolysis:  Type: Diagnostic Medial Branch Facet Block Region: Lumbar Level: L2, L3, L4, L5, & S1 Medial Branch Level(s) Laterality: Bilateral  Type: Local Anesthesia Local Anesthetic: Lidocaine 1% Route: Infiltration ([place]/IM) IV Access: Declined Sedation: Declined  Indication(s): Analgesia          Indications: 1. Lumbar facet syndrome (Location of Primary Source of Pain) (Bilateral) (R>L)   2.  Lumbar DDD (degenerative disc disease)   3. Chronic low back pain (Location of Primary Source of Pain) (Bilateral) (L>R)    Pain Score: Pre-procedure: 5 /10 Post-procedure: 0-No pain/10  Pre-op Assessment:  Previous date of service: 02/23/17 Service provided: Med Refill, Evaluation Douglas Andrews is a 66 y.o. (year old), male patient, seen today for interventional treatment. He  has a past surgical history that includes Appendectomy; Tonsillectomy; and left rod. His primarily concern today is the Back Pain (low and bilateral)  Initial Vital Signs: There were no vitals taken for this visit. BMI: 25.24 kg/m  Risk Assessment: Allergies: Reviewed. He is allergic to sertraline.  Allergy Precautions: None required Coagulopathies: Reviewed. None identified.  Blood-thinner therapy: None at this time Active Infection(s): Reviewed. None identified. Douglas Andrews is afebrile  Site Confirmation: Douglas Andrews was asked to confirm the procedure and laterality before marking the site Procedure checklist:  Completed Consent: Before the procedure and under the influence of no sedative(s), amnesic(s), or anxiolytics, the patient was informed of the treatment options, risks and possible complications. To fulfill our ethical and legal obligations, as recommended by the American Medical Association's Code of Ethics, I have informed the patient of my clinical impression; the nature and purpose of the treatment or procedure; the risks, benefits, and possible complications of the intervention; the alternatives, including doing nothing; the risk(s) and benefit(s) of the alternative treatment(s) or procedure(s); and the risk(s) and benefit(s) of doing nothing. The patient was provided information about the general risks and possible complications associated with the procedure. These may include, but are not limited to: failure to achieve desired goals, infection, bleeding, organ or nerve damage, allergic reactions, paralysis, and death. In addition, the patient was informed of those risks and complications associated to Spine-related procedures, such as failure to decrease pain; infection (i.e.: Meningitis, epidural or intraspinal abscess); bleeding (i.e.: epidural hematoma, subarachnoid hemorrhage, or any other type of intraspinal or peri-dural bleeding); organ or nerve damage (i.e.: Any type of peripheral nerve, nerve root, or spinal cord injury) with subsequent damage to sensory, motor, and/or autonomic systems, resulting in permanent pain, numbness, and/or weakness of one or several areas of the body; allergic reactions; (i.e.: anaphylactic reaction); and/or death. Furthermore, the patient was informed of those risks and complications associated with the medications. These include, but are not limited to: allergic reactions (i.e.: anaphylactic or anaphylactoid reaction(s)); adrenal axis suppression; blood sugar elevation that in diabetics may result in ketoacidosis or comma; water retention that in patients with history  of congestive heart failure may result in shortness of breath, pulmonary edema, and decompensation with resultant heart failure; weight gain; swelling or edema; medication-induced  neural toxicity; particulate matter embolism and blood vessel occlusion with resultant organ, and/or nervous system infarction; and/or aseptic necrosis of one or more joints. Finally, the patient was informed that Medicine is not an exact science; therefore, there is also the possibility of unforeseen or unpredictable risks and/or possible complications that may result in a catastrophic outcome. The patient indicated having understood very clearly. We have given the patient no guarantees and we have made no promises. Enough time was given to the patient to ask questions, all of which were answered to the patient's satisfaction. Douglas Andrews has indicated that he wanted to continue with the procedure. Attestation: I, the ordering provider, attest that I have discussed with the patient the benefits, risks, side-effects, alternatives, likelihood of achieving goals, and potential problems during recovery for the procedure that I have provided informed consent. Date: 03/22/2017; Time: 8:46 AM  Pre-Procedure Preparation:  Monitoring: As per clinic protocol. Respiration, ETCO2, SpO2, BP, heart rate and rhythm monitor placed and checked for adequate function Safety Precautions: Patient was assessed for positional comfort and pressure points before starting the procedure. Time-out: I initiated and conducted the "Time-out" before starting the procedure, as per protocol. The patient was asked to participate by confirming the accuracy of the "Time Out" information. Verification of the correct person, site, and procedure were performed and confirmed by me, the nursing staff, and the patient. "Time-out" conducted as per Joint Commission's Universal Protocol (UP.01.01.01). "Time-out" Date & Time: 03/22/2017; 1002 hrs.  Description of Procedure  Process:   Position: Prone Target Area: For Lumbar Facet blocks, the target is the groove formed by the junction of the transverse process and superior articular process. For the L5 dorsal ramus, the target is the notch between superior articular process and sacral ala. For the S1 dorsal ramus, the target is the superior and lateral edge of the posterior S1 Sacral foramen. Approach: Paramedial approach. Area Prepped: Entire Posterior Lumbosacral Region Prepping solution: ChloraPrep (2% chlorhexidine gluconate and 70% isopropyl alcohol) Safety Precautions: Aspiration looking for blood return was conducted prior to all injections. At no point did we inject any substances, as a needle was being advanced. No attempts were made at seeking any paresthesias. Safe injection practices and needle disposal techniques used. Medications properly checked for expiration dates. SDV (single dose vial) medications used. Description of the Procedure: Protocol guidelines were followed. The patient was placed in position over the fluoroscopy table. The target area was identified and the area prepped in the usual manner. Skin desensitized using vapocoolant spray. Skin & deeper tissues infiltrated with local anesthetic. Appropriate amount of time allowed to pass for local anesthetics to take effect. The procedure needle was introduced through the skin, ipsilateral to the reported pain, and advanced to the target area. Employing the "Medial Branch Technique", the needles were advanced to the angle made by the superior and medial portion of the transverse process, and the lateral and inferior portion of the superior articulating process of the targeted vertebral bodies. This area is known as "Burton's Eye" or the "Eye of the [place] Dog". A procedure needle was introduced through the skin, and this time advanced to the angle made by the superior and medial border of the sacral ala, and the lateral border of the S1 vertebral body.  This last needle was later repositioned at the superior and lateral border of the posterior S1 foramen. Negative aspiration confirmed. Solution injected in intermittent fashion, asking for systemic symptoms every 0.5cc of injectate. The needles were then removed and  the area cleansed, making sure to leave some of the prepping solution back to take advantage of its long term bactericidal properties. Vitals:   03/22/17 1005 03/22/17 1010 03/22/17 1015 03/22/17 1019  BP: (!) 142/111 (!) 150/103 (!) 131/98 (!) 148/98  Pulse: 75 76 74 72  Resp: (!) 21 11 14 17   Temp:      TempSrc:      SpO2: 99% 99% 99% 99%  Weight:      Height:        Start Time: 1002 hrs. End Time: 1017 hrs.  Illustration of the posterior view of the lumbar spine and the posterior neural structures. Laminae of L2 through S1 are labeled. DPRL5, dorsal primary ramus of L5; DPRS1, dorsal primary ramus of S1; DPR3, dorsal primary ramus of L3; FJ, facet (zygapophyseal) joint L3-L4; I, inferior articular process of L4; LB1, lateral branch of dorsal primary ramus of L1; IAB, inferior articular branches from L3 medial branch (supplies L4-L5 facet joint); IBP, intermediate branch plexus; MB3, medial branch of dorsal primary ramus of L3; NR3, third lumbar nerve root; S, superior articular process of L5; SAB, superior articular branches from L4 (supplies L4-5 facet joint also); TP3, transverse process of L3.  Materials:  Needle(s) Type: Regular needle Gauge: 25G Length: 3.5-in Medication(s): We administered lidocaine, triamcinolone acetonide, ropivacaine (PF) 2 mg/mL (0.2%), lidocaine, triamcinolone acetonide, and ropivacaine (PF) 2 mg/mL (0.2%). Please see chart orders for dosing details.  Imaging Guidance (Spinal):  Type of Imaging Technique: Fluoroscopy Guidance (Spinal) Indication(s): Assistance in needle guidance and placement for procedures requiring needle placement in or near specific anatomical locations not easily accessible  without such assistance. Exposure Time: Please see nurses notes. Contrast: None used. Fluoroscopic Guidance: I was personally present during the use of fluoroscopy. "Tunnel Vision Technique" used to obtain the best possible view of the target area. Parallax error corrected before commencing the procedure. "Direction-depth-direction" technique used to introduce the needle under continuous pulsed fluoroscopy. Once target was reached, antero-posterior, oblique, and lateral fluoroscopic projection used confirm needle placement in all planes. Images permanently stored in EMR. Interpretation: No contrast injected. I personally interpreted the imaging intraoperatively. Adequate needle placement confirmed in multiple planes. Permanent images saved into the patient's record.  Antibiotic Prophylaxis:  Indication(s): None identified Antibiotic given: None  Post-operative Assessment:  EBL: None Complications: No immediate post-treatment complications observed by team, or reported by patient. Note: The patient tolerated the entire procedure well. A repeat set of vitals were taken after the procedure and the patient was kept under observation following institutional policy, for this type of procedure. Post-procedural neurological assessment was performed, showing return to baseline, prior to discharge. The patient was provided with post-procedure discharge instructions, including a section on how to identify potential problems. Should any problems arise concerning this procedure, the patient was given instructions to immediately contact us, at any time, without hesitation. In any case, we plan to contact the patient by telephone for a follow-up status report regarding this interventional procedure. Comments:  No additional relevant information.  Plan of Care  Disposition: Discharge home  Discharge Date & Time: 03/22/2017; 1025 hrs.  Physician-requested Follow-up:  Return for post-procedure eval (in 2 wks), by  MD, in addition, Med-Mgmt, by NP.  Future Appointments Date Time Provider Department Center  04/20/2017 2:30 PM Delano Metz, MD [facility] None  05/25/2017 7:45 AM Delano Metz, MD [facility] None   Medications ordered for procedure: Meds ordered this encounter  Medications  . DISCONTD: lactated ringers infusion 1,000 mL  .  DISCONTD: midazolam (VERSED) 5 MG/5ML injection 1-2 mg    Make sure Flumazenil is available in the pyxis when using this medication. If oversedation occurs, administer 0.2 mg IV over 15 sec. If after 45 sec no response, administer 0.2 mg again over 1 min; may repeat at 1 min intervals; not to exceed 4 doses (1 mg)  . DISCONTD: fentaNYL (SUBLIMAZE) injection 25-50 mcg    Make sure Narcan is available in the pyxis when using this medication. In the event of respiratory depression (RR< 8/min): Titrate NARCAN (naloxone) in increments of 0.1 to 0.2 mg IV at 2-3 minute intervals, until desired degree of reversal.  . DISCONTD: lidocaine 1.5 % injection 20 mL    From block tray  . triamcinolone acetonide (KENALOG-40) injection 40 mg  . ropivacaine (PF) 2 mg/mL (0.2%) (NAROPIN) injection 9 mL  . lidocaine 1.5 % injection 20 mL  . triamcinolone acetonide (KENALOG-40) injection 40 mg  . ropivacaine (PF) 2 mg/mL (0.2%) (NAROPIN) injection 9 mL   Medications administered: We administered lidocaine, triamcinolone acetonide, ropivacaine (PF) 2 mg/mL (0.2%), lidocaine, triamcinolone acetonide, and ropivacaine (PF) 2 mg/mL (0.2%).  See the medical record for exact dosing, route, and time of administration.  Lab-work, Procedure(s), & Referral(s) Ordered: Orders Placed This Encounter  Procedures  . LUMBAR FACET(MEDIAL BRANCH NERVE BLOCK) MBNB  . DG C-Arm 1-60 Min-No Report  . Informed Consent Details: Transcribe to consent form and obtain patient signature  . Provider attestation of informed consent for procedure/surgical case  . Verify informed consent  . Discharge  instructions  . Follow-up   Imaging Ordered: Results for orders placed in visit on 01/25/17  DG C-Arm 1-60 Min-No Report   Narrative Fluoroscopy was utilized by the requesting physician.  No radiographic  interpretation.    New Prescriptions   No medications on file   Primary Care Physician: Foley, Shireen Quan, MD Location: [facility] Note by: Sydnee Levans. Laban Emperor, M.D, DABA, DABAPM, DABPM, DABIPP, FIPP Date: 03/22/2017; Time: 12:25 PM  Disclaimer:  Medicine is not an Visual merchandiser. The only guarantee in medicine is that nothing is guaranteed. It is important to note that the decision to proceed with this intervention was based on the information collected from the patient. The Data and conclusions were drawn from the patient's questionnaire, the interview, and the physical examination. Because the information was provided in large part by the patient, it cannot be guaranteed that it has not been purposely or unconsciously manipulated. Every effort has been made to obtain as much relevant data as possible for this evaluation. It is important to note that the conclusions that lead to this procedure are derived in large part from the available data. Always take into account that the treatment will also be dependent on availability of resources and existing treatment guidelines, considered by other Pain Management Practitioners as being common knowledge and practice, at the time of the intervention. For Medico-Legal purposes, it is also important to point out that variation in procedural techniques and pharmacological choices are the acceptable norm. The indications, contraindications, technique, and results of the above procedure should only be interpreted and judged by a Board-Certified Interventional Pain Specialist with extensive familiarity and expertise in the same exact procedure and technique.  Instructions provided at this appointment: Patient Instructions    ____________________________________________________________________________________________  Post-Procedure instructions Instructions:  Apply ice: Fill a plastic sandwich bag with crushed ice. Cover it with a small towel and apply to injection site. Apply for 15 minutes then  remove x 15 minutes. Repeat sequence on day of procedure, until you go to bed. The purpose is to minimize swelling and discomfort after procedure.  Apply heat: Apply heat to procedure site starting the day following the procedure. The purpose is to treat any soreness and discomfort from the procedure.  Food intake: Start with clear liquids (like water) and advance to regular food, as tolerated.   Physical activities: Keep activities to a minimum for the first 8 hours after the procedure.   Driving: If you have received any sedation, you are not allowed to drive for 24 hours after your procedure.  Blood thinner: Restart your blood thinner 6 hours after your procedure. (Only for those taking blood thinners)  Insulin: As soon as you can eat, you may resume your normal dosing schedule. (Only for those taking insulin)  Infection prevention: Keep procedure site clean and dry.  Post-procedure Pain Diary: Extremely important that this be done correctly and accurately. Recorded information will be used to determine the next step in treatment.  Pain evaluated is that of treated area only. Do not include pain from an untreated area.  Complete every hour, on the hour, for the initial 8 hours. Set an alarm to help you do this part accurately.  Do not go to sleep and have it completed later. It will not be accurate.  Follow-up appointment: Keep your follow-up appointment after the procedure. Usually 2 weeks for most procedures. (6 weeks in the case of radiofrequency.) Bring you pain diary.  Expect:  From numbing medicine (AKA: Local Anesthetics): Numbness or decrease in pain.  Onset: Full effect within 15 minutes of  injected.  Duration: It will depend on the type of local anesthetic used. On the average, 1 to 8 hours.   From steroids: Decrease in swelling or inflammation. Once inflammation is improved, relief of the pain will follow.  Onset of benefits: Depends on the amount of swelling present. The more swelling, the longer it will take for the benefits to be seen.   Duration: Steroids will stay in the system x 2 weeks. Duration of benefits will depend on multiple posibilities including persistent irritating factors.  From procedure: Some discomfort is to be expected once the numbing medicine wears off. This should be minimal if ice and heat are applied as instructed. Call if:  You experience numbness and weakness that gets worse with time, as opposed to wearing off.  New onset bowel or bladder incontinence. (Spinal procedures only)  Emergency Numbers:  Durning business hours (Monday - Thursday, 8:00 AM - 4:00 PM) (Friday, 9:00 AM - 12:00 Noon): [phone]  After hours: [phone] ____________________________________________________________________________________________  ____________________________________________________________________________________________  Pain Prevention Technique  Definition:   A technique used to minimize the effects of an activity known to cause inflammation or swelling, which in turn leads to an increase in pain.  Purpose: To prevent swelling from occurring. It is based on the fact that it is easier to prevent swelling from happening than it is to get rid of it, once it occurs.  Contraindications: 1. Anyone with allergy or hypersensitivity to the recommended medications. 2. Anyone taking anticoagulants (Blood Thinners) (e.g., Coumadin, Warfarin, Plavix, etc.). 3. Patients in Renal Failure.  Technique: Before you undertake an activity known to cause pain, or a flare-up of your chronic pain, and before you experience any pain, do the  following:  1. On a full stomach, take 4 (four) over the counter Ibuprofens 200mg  tablets (Motrin), for a total of 800 mg.  2. In addition, take over the counter Magnesium 400 to 500 mg, before doing the activity.  3. Six (6) hours later, again on a full stomach, repeat the Ibuprofen. 4. That night, take a warm shower and stretch under the running warm water.  This technique may be sufficient to abort the pain and discomfort before it happens. Keep in mind that it takes a lot less medication to prevent swelling than it takes to eliminate it once it occurs.  ____________________________________________________________________________________________  Pain Management Discharge Instructions  General Discharge Instructions :  If you need to reach your doctor call: Monday-Friday 8:00 am - 4:00 pm at [phone] or toll free [phone].  After clinic hours [phone] to have operator reach doctor.  Bring all of your medication bottles to all your appointments in the pain clinic.  To cancel or reschedule your appointment with Pain Management please remember to call 24 hours in advance to avoid a fee.  Refer to the educational materials which you have been given on: General Risks, I had my Procedure. Discharge Instructions, Post Sedation.  Post Procedure Instructions:  The drugs you were given will stay in your system until tomorrow, so for the next 24 hours you should not drive, make any legal decisions or drink any alcoholic beverages.  You may eat anything you prefer, but it is better to start with liquids then soups and crackers, and gradually work up to solid foods.  Please notify your doctor immediately if you have any unusual bleeding, trouble breathing or pain that is not related to your normal pain.  Depending on the type of procedure that was done, some parts of your body may feel week and/or numb.  This usually clears up by tonight or the next day.  Walk with the use of an  assistive device or accompanied by an adult for the 24 hours.  You may use ice on the affected area for the first 24 hours.  Put ice in a Ziploc bag and cover with a towel and place against area 15 minutes on 15 minutes off.  You may switch to heat after 24 hours.GENERAL RISKS AND COMPLICATIONS  What are the risk, side effects and possible complications? Generally speaking, most procedures are safe.  However, with any procedure there are risks, side effects, and the possibility of complications.  The risks and complications are dependent upon the sites that are lesioned, or the type of nerve block to be performed.  The closer the procedure is to the spine, the more serious the risks are.  Great care is taken when placing the radio frequency needles, block needles or lesioning probes, but sometimes complications can occur. 1. Infection: Any time there is an injection through the skin, there is a risk of infection.  This is why sterile conditions are used for these blocks.  There are four possible types of infection. 1. Localized skin infection. 2. Central Nervous System Infection-This can be in the form of Meningitis, which can be deadly. 3. Epidural Infections-This can be in the form of an epidural abscess, which can cause pressure inside of the spine, causing compression of the spinal cord with subsequent paralysis. This would require an emergency surgery to decompress, and there are no guarantees that the patient would recover from the paralysis. 4. Discitis-This is an infection of the intervertebral discs.  It occurs in about 1% of discography procedures.  It is difficult to treat and it may lead to surgery.        2.  Pain: the needles have to go through skin and soft tissues, will cause soreness.       3. Damage to internal structures:  The nerves to be lesioned may be near blood vessels or    other nerves which can be potentially damaged.       4. Bleeding: Bleeding is more common if the patient  is taking blood thinners such as  aspirin, Coumadin, Ticiid, Plavix, etc., or if he/she have some genetic predisposition  such as hemophilia. Bleeding into the spinal canal can cause compression of the spinal  cord with subsequent paralysis.  This would require an emergency surgery to  decompress and there are no guarantees that the patient would recover from the  paralysis.       5. Pneumothorax:  Puncturing of a lung is a possibility, every time a needle is introduced in  the area of the chest or upper back.  Pneumothorax refers to free air around the  collapsed lung(s), inside of the thoracic cavity (chest cavity).  Another two possible  complications related to a similar event would include: Hemothorax and Chylothorax.   These are variations of the Pneumothorax, where instead of air around the collapsed  lung(s), you may have blood or chyle, respectively.       6. Spinal headaches: They may occur with any procedures in the area of the spine.       7. Persistent CSF (Cerebro-Spinal Fluid) leakage: This is a rare problem, but may occur  with prolonged intrathecal or epidural catheters either due to the formation of a fistulous  track or a dural tear.       8. Nerve damage: By working so close to the spinal cord, there is always a possibility of  nerve damage, which could be as serious as a permanent spinal cord injury with  paralysis.       9. Death:  Although rare, severe deadly allergic reactions known as "Anaphylactic  reaction" can occur to any of the medications used.      10. Worsening of the symptoms:  We can always make thing worse.  What are the chances of something like this happening? Chances of any of this occuring are extremely low.  By statistics, you have more of a chance of getting killed in a motor vehicle accident: while driving to the hospital than any of the above occurring .  Nevertheless, you should be aware that they are possibilities.  In general, it is similar to taking a shower.   Everybody knows that you can slip, hit your head and get killed.  Does that mean that you should not shower again?  Nevertheless always keep in mind that statistics do not mean anything if you happen to be on the wrong side of them.  Even if a procedure has a 1 (one) in a 1,000,000 (million) chance of going wrong, it you happen to be that one..Also, keep in mind that by statistics, you have more of a chance of having something go wrong when taking medications.  Who should not have this procedure? If you are on a blood thinning medication (e.g. Coumadin, Plavix, see list of "Blood Thinners"), or if you have an active infection going on, you should not have the procedure.  If you are taking any blood thinners, please inform your physician.  How should I prepare for this procedure?  Do not eat or drink anything at least six hours prior to the procedure.  Bring a driver with  you .  It cannot be a taxi.  Come accompanied by an adult that can drive you back, and that is strong enough to help you if your legs get weak or numb from the local anesthetic.  Take all of your medicines the morning of the procedure with just enough water to swallow them.  If you have diabetes, make sure that you are scheduled to have your procedure done first thing in the morning, whenever possible.  If you have diabetes, take only half of your insulin dose and notify our nurse that you have done so as soon as you arrive at the clinic.  If you are diabetic, but only take blood sugar pills (oral hypoglycemic), then do not take them on the morning of your procedure.  You may take them after you have had the procedure.  Do not take aspirin or any aspirin-containing medications, at least eleven (11) days prior to the procedure.  They may prolong bleeding.  Wear loose fitting clothing that may be easy to take off and that you would not mind if it got stained with Betadine or blood.  Do not wear any jewelry or perfume  Remove  any nail coloring.  It will interfere with some of our monitoring equipment.  NOTE: Remember that this is not meant to be interpreted as a complete list of all possible complications.  Unforeseen problems may occur.  BLOOD THINNERS The following drugs contain aspirin or other products, which can cause increased bleeding during surgery and should not be taken for 2 weeks prior to and 1 week after surgery.  If you should need take something for relief of minor pain, you may take acetaminophen which is found in Tylenol,m Datril, Anacin-3 and Panadol. It is not blood thinner. The products listed below are.  Do not take any of the products listed below in addition to any listed on your instruction sheet.  A.P.C or A.P.C with Codeine Codeine Phosphate Capsules #3 Ibuprofen Ridaura  ABC compound Congesprin Imuran rimadil  Advil Cope Indocin Robaxisal  Alka-Seltzer Effervescent Pain Reliever and Antacid Coricidin or Coricidin-D  Indomethacin Rufen  Alka-Seltzer plus Cold Medicine Cosprin Ketoprofen S-A-C Tablets  Anacin Analgesic Tablets or Capsules Coumadin Korlgesic Salflex  Anacin Extra Strength Analgesic tablets or capsules CP-2 Tablets Lanoril Salicylate  Anaprox Cuprimine Capsules Levenox Salocol  Anexsia-D Dalteparin Magan Salsalate  Anodynos Darvon compound Magnesium Salicylate Sine-off  Ansaid Dasin Capsules Magsal Sodium Salicylate  Anturane Depen Capsules Marnal Soma  APF Arthritis pain formula Dewitt's Pills Measurin Stanback  Argesic Dia-Gesic Meclofenamic Sulfinpyrazone  Arthritis Bayer Timed Release Aspirin Diclofenac Meclomen Sulindac  Arthritis pain formula Anacin Dicumarol Medipren Supac  Analgesic (Safety coated) Arthralgen Diffunasal Mefanamic Suprofen  Arthritis Strength Bufferin Dihydrocodeine Mepro Compound Suprol  Arthropan liquid Dopirydamole Methcarbomol with Aspirin Synalgos  ASA tablets/Enseals Disalcid Micrainin Tagament  Ascriptin Doan's Midol Talwin  Ascriptin A/D  Dolene Mobidin Tanderil  Ascriptin Extra Strength Dolobid Moblgesic Ticlid  Ascriptin with Codeine Doloprin or Doloprin with Codeine Momentum Tolectin  Asperbuf Duoprin Mono-gesic Trendar  Aspergum Duradyne Motrin or Motrin IB Triminicin  Aspirin plain, buffered or enteric coated Durasal Myochrisine Trigesic  Aspirin Suppositories Easprin Nalfon Trillsate  Aspirin with Codeine Ecotrin Regular or Extra Strength Naprosyn Uracel  Atromid-S Efficin Naproxen Ursinus  Auranofin Capsules Elmiron Neocylate Vanquish  Axotal Emagrin Norgesic Verin  Azathioprine Empirin or Empirin with Codeine Normiflo Vitamin E  Azolid Emprazil Nuprin Voltaren  Bayer Aspirin plain, buffered or children's or timed BC Tablets or powders Encaprin  Orgaran Warfarin Sodium  Buff-a-Comp Enoxaparin Orudis Zorpin  Buff-a-Comp with Codeine Equegesic Os-Cal-Gesic   Buffaprin Excedrin plain, buffered or Extra Strength Oxalid   Bufferin Arthritis Strength Feldene Oxphenbutazone   Bufferin plain or Extra Strength Feldene Capsules Oxycodone with Aspirin   Bufferin with Codeine Fenoprofen Fenoprofen Pabalate or Pabalate-SF   Buffets II Flogesic Panagesic   Buffinol plain or Extra Strength Florinal or Florinal with Codeine Panwarfarin   Buf-Tabs Flurbiprofen Penicillamine   Butalbital Compound Four-way cold tablets Penicillin   Butazolidin Fragmin Pepto-Bismol   Carbenicillin Geminisyn Percodan   Carna Arthritis Reliever Geopen Persantine   Carprofen Gold's salt Persistin   Chloramphenicol Goody's Phenylbutazone   Chloromycetin Haltrain Piroxlcam   Clmetidine heparin Plaquenil   Cllnoril Hyco-pap Ponstel   Clofibrate Hydroxy chloroquine Propoxyphen         Before stopping any of these medications, be sure to consult the physician who ordered them.  Some, such as Coumadin (Warfarin) are ordered to prevent or treat serious conditions such as "deep thrombosis", "pumonary embolisms", and other heart problems.  The amount of time  that you may need off of the medication may also vary with the medication and the reason for which you were taking it.  If you are taking any of these medications, please make sure you notify your pain physician before you undergo any procedures.          Facet Joint Block, Care After Refer to this sheet in the next few weeks. These instructions provide you with information about caring for yourself after your procedure. Your health care provider may also give you more specific instructions. Your treatment has been planned according to current medical practices, but problems sometimes occur. Call your health care provider if you have any problems or questions after your procedure. What can I expect after the procedure? After the procedure, it is common to have:  Some tenderness over the injection sites for 2 days after the procedure.  A temporary increase in blood sugar if you have diabetes.  Follow these instructions at home:  Keep track of the amount of pain relief you feel and how long it lasts.  Take over-the-counter and prescription medicines only as told by your health care provider. You may need to limit pain medicine within the first 4-6 hours after the procedure.  Remove your bandages (dressings) the morning after the procedure.  For the first 24 hours after the procedure: ? Do not apply heat near or over the injection sites. ? Do not take a bath or soak in water, such as in a pool or lake. ? Do not drive or operate heavy machinery unless approved by your health care provider. ? Avoid activities that require a lot of energy.  If the injection site is tender, try applying ice to the area. To do this: ? Put ice in a plastic bag. ? Place a towel between your skin and the bag. ? Leave the ice on for 20 minutes, 2-3 times a day.  Keep all follow-up visits as told by your health care provider. This is important. Contact a health care provider if:  Fluid is coming from an  injection site.  There is significant bleeding or swelling at an injection site.  You have diabetes and your blood sugar is above 180 mg/dL. Get help right away if:  You have a fever.  You have worsening pain or swelling around an injection site.  There are red streaks around an injection site.  You develop  severe pain that is not controlled by your medicines.  You develop a headache, stiff neck, nausea, or vomiting.  Your eyes become very sensitive to light.  You have weakness, paralysis, or tingling in your arms or legs that was not present before the procedure.  You have difficulty urinating or breathing. This information is not intended to replace advice given to you by your health care provider. Make sure you discuss any questions you have with your health care provider. Document Released: 09/12/2012 Document Revised: 02/10/2016 Document Reviewed: 06/22/2015 Elsevier Interactive Patient Education  2018 [employer]  Facet Joint Block The facet joints connect the bones of the spine (vertebrae). They make it possible for you to bend, twist, and make other movements with your spine. They also keep you from bending too far, twisting too far, and making other excessive movements. A facet joint block is a procedure where a numbing medicine (anesthetic) is injected into a facet joint. Often, a type of anti-inflammatory medicine called a steroid is also injected. A facet joint block may be done to diagnose neck or back pain. If the pain gets better after a facet joint block, it means the pain is probably coming from the facet joint. If the pain does not get better, it means the pain is probably not coming from the facet joint. A facet joint block may also be done to relieve neck or back pain caused by an inflamed facet joint. A facet joint block is only done to relieve pain if the pain does not improve with other methods, such as medicine, exercise programs, and physical therapy. Tell a  health care provider about:  Any allergies you have.  All medicines you are taking, including vitamins, herbs, eye drops, creams, and over-the-counter medicines.  Any problems you or family members have had with anesthetic medicines.  Any blood disorders you have.  Any surgeries you have had.  Any medical conditions you have.  Whether you are pregnant or may be pregnant. What are the risks? Generally, this is a safe procedure. However, problems may occur, including:  Bleeding.  Injury to a nerve near the injection site.  Pain at the injection site.  Weakness or numbness in areas controlled by nerves near the injection site.  Infection.  Temporary fluid retention.  Allergic reactions to medicines or dyes.  Injury to other structures or organs near the injection site.  What happens before the procedure?  Follow instructions from your health care provider about eating or drinking restrictions.  Ask your health care provider about: ? Changing or stopping your regular medicines. This is especially important if you are taking diabetes medicines or blood thinners. ? Taking medicines such as aspirin and ibuprofen. These medicines can thin your blood. Do not take these medicines before your procedure if your health care provider instructs you not to.  Do not take any new dietary supplements or medicines without asking your health care provider first.  Plan to have someone take you home after the procedure. What happens during the procedure?  You may need to remove your clothing and dress in an open-back gown.  The procedure will be done while you are lying on an X-ray table. You will most likely be asked to lie on your stomach, but you may be asked to lie in a different position if an injection will be made in your neck.  Machines will be used to monitor your oxygen levels, heart rate, and blood pressure.  If an injection will be  made in your neck, an IV tube will be  inserted into one of your veins. Fluids and medicine will flow directly into your body through the IV tube.  The area over the facet joint where the injection will be made will be cleaned with soap. The surrounding skin will be covered with clean drapes.  A numbing medicine (local anesthetic) will be applied to your skin. Your skin may sting or burn for a moment.  A video X-ray machine (fluoroscopy) will be used to locate the joint. In some cases, a CT scan may be used.  A contrast dye may be injected into the facet joint area to help locate the joint.  When the joint is located, an anesthetic will be injected into the joint through the needle.  Your health care provider will ask you whether you feel pain relief. If you do feel relief, a steroid may be injected to provide pain relief for a longer period of time. If you do not feel relief or feel only partial relief, additional injections of an anesthetic may be made in other facet joints.  The needle will be removed.  Your skin will be cleaned.  A bandage (dressing) will be applied over each injection site. The procedure may vary among health care providers and hospitals. What happens after the procedure?  You will be observed for 15-30 minutes before being allowed to go home. This information is not intended to replace advice given to you by your health care provider. Make sure you discuss any questions you have with your health care provider. Document Released: 02/15/2007 Document Revised: 10/28/2015 Document Reviewed: 06/22/2015 Elsevier Interactive Patient Education  [employer].

## 2017-03-23 ENCOUNTER — Telehealth: Payer: Self-pay | Admitting: *Deleted

## 2017-03-23 NOTE — Telephone Encounter (Signed)
Attempted to call for post procedure follow-up. No answer. 

## 2017-03-27 DIAGNOSIS — R1084 Generalized abdominal pain: Secondary | ICD-10-CM | POA: Insufficient documentation

## 2017-03-27 DIAGNOSIS — E1165 Type 2 diabetes mellitus with hyperglycemia: Secondary | ICD-10-CM | POA: Insufficient documentation

## 2017-03-27 DIAGNOSIS — N481 Balanitis: Secondary | ICD-10-CM | POA: Insufficient documentation

## 2017-04-19 NOTE — Progress Notes (Deleted)
Patient's Name: Douglas Andrews  MRN: [id number]  Referring Provider: Fritzi Mandes, MD  DOB: 08-02-1951  PCP: Fritzi Mandes, MD  DOS: 04/20/2017  Note by: Gaspar Cola, MD  Service setting: Ambulatory outpatient  Specialty: Interventional Pain Management  Location: [facility] (AMB) Pain Management Facility    Patient type: Established   Primary Reason(s) for Visit: Encounter for post-procedure evaluation of chronic illness with mild to moderate exacerbation CC: No chief complaint on file.  HPI  [name] is a 66 y.o. year old, male patient, who comes today for a post-procedure evaluation. He has Chronic low back pain (Location of Primary Source of Pain) (Bilateral) (L>R); Chronic pain syndrome;  Lumbar DDD (degenerative disc disease);  Lumbar Spondylosis; Lumbar radicular pain (Right) (L5 Dermatome); Lumbar facet syndrome (Location of Primary Source of Pain) (Bilateral) (R>L); Opiate use (60 MME/Day); Uncomplicated opioid dependence ([place]); Long term prescription opiate use; Chronic knee pain (Location of Tertiary source of pain) (Bilateral) (Right); Vitamin D insufficiency; Overweight; Bell palsy; CN (constipation); Dermatitis, eczematoid; Diabetes mellitus ([place]); Dysmetria; Essential (primary) hypertension; Anxiety, generalized; Insomnia due to medical condition; Long term current use of systemic steroids; Type 2 diabetes mellitus ([place]); Pulmonary sarcoidosis ([place]); Arthropathy, traumatic, shoulder; Bursitis, trochanteric; Esophageal varices ([place]); Leg varices; Hepatic cirrhosis ([place]); Major depressive disorder with single episode; Other long term (current) drug therapy; At risk for falling; Encounter for chronic pain management; Encounter for therapeutic drug level monitoring; Chronic lower extremity pain (Location of Secondary source of pain) (Right); Chronic lumbar radicular pain (Left) (L5 Dermatome); Lumbar Levoscoliosis; Neurogenic pain; Osteoarthritis of knee (Right); At high risk for  falls; Chest pain; Skin lesion; Pain of left femur (left femoral rod due to sarcoidosis); Chronic hip pain (Right); Osteoarthritis of knee (Bilateral) (R>L); Essential hypertension; Folliculitis; Chronic sacroiliac joint pain (Left); Chronic hip pain (Left); Chronic thoracic back pain (Left); Diarrhea; Numbness on left side; Gastroenteritis; Other fatigue; Urinary incontinence without sensory awareness; Balanitis; Generalized abdominal pain; and Hyperglycemic crisis in diabetes mellitus ([place]) on his problem list. His primarily concern today is the No chief complaint on file.  Pain Assessment: Location:     Radiating:   Onset:   Duration:   Quality:   Severity:  /10 (self-reported pain score)  Note: Reported level is compatible with observation.                   Effect on ADL:   Timing:   Modifying factors:    [name] comes in today for post-procedure evaluation after the treatment done on 03/22/2017.  Further details on both, my assessment(s), as well as the proposed treatment plan, please see below.  Post-Procedure Assessment  03/22/2017 Procedure: Diagnostic bilateral lumbar facet block under fluoroscopic guidance, no sedation Pre-procedure pain score:  5/10 Post-procedure pain score: 0/10 (100% relief) Influential Factors: BMI:   Intra-procedural challenges: None observed Assessment challenges: None detected         Post-procedural adverse reactions or complications: None reported Reported side-effects: None  Sedation: No sedation used. When no sedatives are used, the analgesic levels obtained are directly associated to the effectiveness of the local anesthetics. However, when sedation is provided, the level of analgesia obtained during the initial 1 hour following the intervention, is believed to be the result of a combination of factors. These factors may include, but are not limited to: 1. The effectiveness of the local anesthetics used. 2. The effects of the analgesic(s)  and/or anxiolytic(s) used. 3. The degree of discomfort experienced by the patient  at the time of the procedure. 4. The patients ability and reliability in recalling and recording the events. 5. The presence and influence of possible secondary gains and/or psychosocial factors. Reported result: Relief experienced during the 1st hour after the procedure:   (Ultra-Short Term Relief) Interpretative annotation: Analgesia during this period is likely to be Local Anesthetic and/or IV Sedative (Analgesic/Anxiolitic) related.          Effects of local anesthetic: The analgesic effects attained during this period are directly associated to the localized infiltration of local anesthetics and therefore cary significant diagnostic value as to the etiological location, or anatomical origin, of the pain. Expected duration of relief is directly dependent on the pharmacodynamics of the local anesthetic used. Long-acting (4-6 hours) anesthetics used.  Reported result: Relief during the next 4 to 6 hour after the procedure:   (Short-Term Relief) Interpretative annotation: Complete relief would suggest area to be the source of the pain.          Long-term benefit: Defined as the period of time past the expected duration of local anesthetics. With the possible exception of prolonged sympathetic blockade from the local anesthetics, benefits during this period are typically attributed to, or associated with, other factors such as analgesic sensory neuropraxia, antiinflammatory effects, or beneficial biochemical changes provided by agents other than the local anesthetics Reported result: Extended relief following procedure:   (Long-Term Relief) Interpretative annotation: Good relief. This could suggest inflammation to be a significant component in the etiology to the pain.          Current benefits: Defined as persistent relief that continues at this point in time.   Reported results: Treated area: *** %        Interpretative annotation: Ongoing benefits would suggest effective therapeutic approach  Interpretation: Results would suggest a successful diagnostic intervention.          Laboratory Chemistry  Inflammation Markers (CRP: Acute Phase) (ESR: Chronic Phase) Lab Results  Component Value Date   CRP <0.5 03/30/2016   ESRSEDRATE 1 03/30/2016                 Renal Function Markers Lab Results  Component Value Date   BUN 12 03/30/2016   CREATININE 0.85 03/30/2016   GFRAA >60 03/30/2016   GFRNONAA >60 03/30/2016                 Hepatic Function Markers Lab Results  Component Value Date   AST 24 03/30/2016   ALT 27 03/30/2016   ALBUMIN 4.6 03/30/2016   ALKPHOS 63 03/30/2016                 Electrolytes Lab Results  Component Value Date   NA 136 03/30/2016   K 3.8 03/30/2016   CL 100 (L) 03/30/2016   CALCIUM 9.2 03/30/2016   MG 2.2 03/30/2016                 Neuropathy Markers Lab Results  Component Value Date   VITAMINB12 539 03/30/2016                 Bone Pathology Markers Lab Results  Component Value Date   ALKPHOS 63 03/30/2016   25OHVITD1 38 03/30/2016   25OHVITD2 3.4 03/30/2016   25OHVITD3 35 03/30/2016   CALCIUM 9.2 03/30/2016                 Coagulation Parameters No results found for: INR, LABPROT, APTT, PLT  Cardiovascular Markers No results found for: BNP, HGB, HCT               Note: Lab results reviewed.  Recent Diagnostic Imaging Review  Dg C-arm 1-60 Min-no Report  Result Date: 03/22/2017 Fluoroscopy was utilized by the requesting physician.  No radiographic interpretation.   Note: Imaging results reviewed.          Meds   No outpatient prescriptions have been marked as taking for the 04/20/17 encounter (Appointment) with Milinda Pointer, MD.    ROS  Constitutional: Denies any fever or chills Gastrointestinal: No reported hemesis, hematochezia, vomiting, or acute GI distress Musculoskeletal: Denies any acute onset  joint swelling, redness, loss of ROM, or weakness Neurological: No reported episodes of acute onset apraxia, aphasia, dysarthria, agnosia, amnesia, paralysis, loss of coordination, or loss of consciousness  Allergies  Mr. Matty is allergic to sertraline.  [place]  Drug: Mr. Fails  reports that he does not use drugs. Alcohol:  reports that he does not drink alcohol. Tobacco:  reports that he has never smoked. He has never used smokeless tobacco. Medical:  has a past medical history of Chronic back pain; Diabetes mellitus without complication ([place]); Hyperlipidemia; Hypertension; Left leg pain (01/02/2014); Right leg pain (07/15/2015); and Sarcoidosis. Surgical: Mr. Whidbee  has a past surgical history that includes Appendectomy; Tonsillectomy; and left rod. Family: family history includes Diabetes in his father; Heart disease in his father and mother; Stroke in his father.  Constitutional Exam  General appearance: Well nourished, well developed, and well hydrated. In no apparent acute distress There were no vitals filed for this visit. BMI Assessment: Estimated body mass index is 25.24 kg/m as calculated from the following:   Height as of 03/22/17: '5\' 8"'$  (1.727 m).   Weight as of 03/22/17: 166 lb (75.3 kg).  BMI interpretation table: BMI level Category Range association with higher incidence of chronic pain  <18 kg/m2 Underweight   18.5-24.9 kg/m2 Ideal body weight   25-29.9 kg/m2 Overweight Increased incidence by 20%  30-34.9 kg/m2 Obese (Class I) Increased incidence by 68%  35-39.9 kg/m2 Severe obesity (Class II) Increased incidence by 136%  >40 kg/m2 Extreme obesity (Class III) Increased incidence by 254%   BMI Readings from Last 4 Encounters:  03/22/17 25.24 kg/m  02/23/17 23.82 kg/m  01/25/17 25.85 kg/m  11/29/16 26.15 kg/m   Wt Readings from Last 4 Encounters:  03/22/17 166 lb (75.3 kg)  02/23/17 159 lb (72.1 kg)  01/25/17 170 lb (77.1 kg)  11/29/16 172 lb (78 kg)   Psych/Mental status: Alert, oriented x 3 (person, place, & time)       Eyes: PERLA Respiratory: No evidence of acute respiratory distress  Cervical Spine Exam  Inspection: No masses, redness, or swelling Alignment: Symmetrical Functional ROM: Unrestricted ROM      Stability: No instability detected Muscle strength & Tone: Functionally intact Sensory: Unimpaired Palpation: No palpable anomalies              Upper Extremity (UE) Exam    Side: Right upper extremity  Side: Left upper extremity  Inspection: No masses, redness, swelling, or asymmetry. No contractures  Inspection: No masses, redness, swelling, or asymmetry. No contractures  Functional ROM: Unrestricted ROM          Functional ROM: Unrestricted ROM          Muscle strength & Tone: Functionally intact  Muscle strength & Tone: Functionally intact  Sensory: Unimpaired  Sensory: Unimpaired  Palpation: No palpable anomalies  Palpation: No palpable anomalies              Specialized Test(s): Deferred         Specialized Test(s): Deferred          Thoracic Spine Exam  Inspection: No masses, redness, or swelling Alignment: Symmetrical Functional ROM: Unrestricted ROM Stability: No instability detected Sensory: Unimpaired Muscle strength & Tone: No palpable anomalies  Lumbar Spine Exam  Inspection: No masses, redness, or swelling Alignment: Symmetrical Functional ROM: Unrestricted ROM      Stability: No instability detected Muscle strength & Tone: Functionally intact Sensory: Unimpaired Palpation: No palpable anomalies       Provocative Tests: Lumbar Hyperextension and rotation test: evaluation deferred today       Patrick's Maneuver: evaluation deferred today                    Gait & Posture Assessment  Ambulation: Unassisted Gait: Relatively normal for age and body habitus Posture: WNL   Lower Extremity Exam    Side: Right lower extremity  Side: Left lower extremity  Inspection: No masses, redness,  swelling, or asymmetry. No contractures  Inspection: No masses, redness, swelling, or asymmetry. No contractures  Functional ROM: Unrestricted ROM          Functional ROM: Unrestricted ROM          Muscle strength & Tone: Functionally intact  Muscle strength & Tone: Functionally intact  Sensory: Unimpaired  Sensory: Unimpaired  Palpation: No palpable anomalies  Palpation: No palpable anomalies   Assessment  Primary Diagnosis & Pertinent Problem List: The primary encounter diagnosis was Chronic low back pain (Location of Primary Source of Pain) (Bilateral) (L>R). Diagnoses of Chronic lower extremity pain (Location of Secondary source of pain) (Right), Chronic knee pain (Location of Tertiary source of pain) (Bilateral) (Right), Lumbar facet syndrome (Location of Primary Source of Pain) (Bilateral) (R>L), Chronic sacroiliac joint pain (Left), Lumbar radicular pain (Right) (L5 Dermatome), Chronic lumbar radicular pain (Left) (L5 Dermatome), Chronic pain syndrome, Long term current use of systemic steroids, Long term prescription opiate use, and Opiate use (60 MME/Day) were also pertinent to this visit.  Status Diagnosis  Controlled Controlled Controlled 1. Chronic low back pain (Location of Primary Source of Pain) (Bilateral) (L>R)   2. Chronic lower extremity pain (Location of Secondary source of pain) (Right)   3. Chronic knee pain (Location of Tertiary source of pain) (Bilateral) (Right)   4. Lumbar facet syndrome (Location of Primary Source of Pain) (Bilateral) (R>L)   5. Chronic sacroiliac joint pain (Left)   6. Lumbar radicular pain (Right) (L5 Dermatome)   7. Chronic lumbar radicular pain (Left) (L5 Dermatome)   8. Chronic pain syndrome   9. Long term current use of systemic steroids   10. Long term prescription opiate use   11. Opiate use (60 MME/Day)     Problems updated and reviewed during this visit: Problem  Balanitis   Last Assessment & Plan:  Resolved. Advised to restart  clotrimazole and/or nystatin with rash recurrence, which has effectively treated most recent episode.   Generalized Abdominal Pain   Last Assessment & Plan:  66 yo male with hx of poorly controlled type 2 DM, liver cirrhosis, s/p corticosteroid injections for chronic back pain last week, presented to clinic with abdominal pain, penile pain, new onset urinary incontinence and hyperglycemia.  He had +++ glucose in urine, new incontinence likely related to hyperglycemia (541 in clinic today, with reports of glucose in the  600-700's over the weekend).  He has + guarding and rebound in his abdominal exam and in the setting of sarcoidosis with hx of liver cirrhosis, concern with ascites and/or intraabdominal process. He also has balanitis, related to hyperglycemia which has led to urinary incontinence, no signs of strangulation.   Will refer to hospital for hyperglycemic management, rule out DKA, abdominal pain work up, likely requiring imaging, and potential treatment of balanitis depending on LOS at the hospital.    Hyperglycemic Crisis in Diabetes Mellitus ([facility])   Plan of Care  Pharmacotherapy (Medications Ordered): No orders of the defined types were placed in this encounter.  New Prescriptions   No medications on file   Medications administered today: Mr. Nickles had no medications administered during this visit.  Lab-work, procedure(s), and/or referral(s): No orders of the defined types were placed in this encounter.   Interventional therapies: Planned, scheduled, and/or pending:   Plan to change Percocet to Oxycodone secondary to endoscopy indicating cirrhosis of the liver.  Start Amitiza for OIC.   Considering:   Diagnostic bilateral Lumbar facet block Possible bilateral lumbar facet radiofrequency ablation Diagnostic right intra-articular hip injection Possible radiofrequency of the right hipjoint Diagnostic Right L4-5 lumbar epidural steroid injection Diagnostic bilateral  intra-articular knee injection Possible series of 5 Hyalgan knee injections.  Diagnostic bilateral Genicular nerve block Possible bilateral Genicular nerve radiofrequencyablation   Palliative PRN treatment(s):   Palliative bilateral Lumbar facet block Palliative right intra-articular hip injection Palliative Right L4-5 lumbar epidural steroid injection Palliative bilateral intra-articular knee injection Palliative bilateral Genicular nerve block   Provider-requested follow-up: No Follow-up on file.  Future Appointments Date Time Provider [place]  04/20/2017 2:30 PM Milinda Pointer, MD [facility] None  05/25/2017 7:45 AM Milinda Pointer, MD [facility] None   Primary Care Physician: Fritzi Mandes, MD Location: [facility] Note by: Gaspar Cola, MD Date: 04/20/2017; Time: 10:41 PM  There are no Patient Instructions on file for this visit.

## 2017-04-20 ENCOUNTER — Ambulatory Visit: Payer: Medicare Other | Admitting: Pain Medicine

## 2017-05-18 ENCOUNTER — Encounter: Payer: Self-pay | Admitting: Pain Medicine

## 2017-05-18 ENCOUNTER — Ambulatory Visit: Payer: Medicare Other | Attending: Pain Medicine | Admitting: Pain Medicine

## 2017-05-18 VITALS — BP 124/96 | HR 84 | Temp 97.8°F | Resp 16 | Ht 68.5 in | Wt 161.4 lb

## 2017-05-18 DIAGNOSIS — K7460 Unspecified cirrhosis of liver: Secondary | ICD-10-CM | POA: Insufficient documentation

## 2017-05-18 DIAGNOSIS — M79604 Pain in right leg: Secondary | ICD-10-CM | POA: Insufficient documentation

## 2017-05-18 DIAGNOSIS — K529 Noninfective gastroenteritis and colitis, unspecified: Secondary | ICD-10-CM | POA: Insufficient documentation

## 2017-05-18 DIAGNOSIS — M47816 Spondylosis without myelopathy or radiculopathy, lumbar region: Secondary | ICD-10-CM | POA: Diagnosis not present

## 2017-05-18 DIAGNOSIS — E1165 Type 2 diabetes mellitus with hyperglycemia: Secondary | ICD-10-CM | POA: Insufficient documentation

## 2017-05-18 DIAGNOSIS — M546 Pain in thoracic spine: Secondary | ICD-10-CM | POA: Insufficient documentation

## 2017-05-18 DIAGNOSIS — M792 Neuralgia and neuritis, unspecified: Secondary | ICD-10-CM

## 2017-05-18 DIAGNOSIS — M25551 Pain in right hip: Secondary | ICD-10-CM | POA: Insufficient documentation

## 2017-05-18 DIAGNOSIS — F1190 Opioid use, unspecified, uncomplicated: Secondary | ICD-10-CM

## 2017-05-18 DIAGNOSIS — R079 Chest pain, unspecified: Secondary | ICD-10-CM | POA: Insufficient documentation

## 2017-05-18 DIAGNOSIS — M545 Low back pain: Secondary | ICD-10-CM | POA: Insufficient documentation

## 2017-05-18 DIAGNOSIS — N3942 Incontinence without sensory awareness: Secondary | ICD-10-CM | POA: Diagnosis not present

## 2017-05-18 DIAGNOSIS — D869 Sarcoidosis, unspecified: Secondary | ICD-10-CM | POA: Insufficient documentation

## 2017-05-18 DIAGNOSIS — G8929 Other chronic pain: Secondary | ICD-10-CM

## 2017-05-18 DIAGNOSIS — G510 Bell's palsy: Secondary | ICD-10-CM | POA: Diagnosis not present

## 2017-05-18 DIAGNOSIS — M533 Sacrococcygeal disorders, not elsewhere classified: Secondary | ICD-10-CM | POA: Diagnosis not present

## 2017-05-18 DIAGNOSIS — R278 Other lack of coordination: Secondary | ICD-10-CM | POA: Diagnosis not present

## 2017-05-18 DIAGNOSIS — M5116 Intervertebral disc disorders with radiculopathy, lumbar region: Secondary | ICD-10-CM | POA: Diagnosis not present

## 2017-05-18 DIAGNOSIS — E559 Vitamin D deficiency, unspecified: Secondary | ICD-10-CM | POA: Insufficient documentation

## 2017-05-18 DIAGNOSIS — Z79891 Long term (current) use of opiate analgesic: Secondary | ICD-10-CM | POA: Insufficient documentation

## 2017-05-18 DIAGNOSIS — M25562 Pain in left knee: Secondary | ICD-10-CM | POA: Insufficient documentation

## 2017-05-18 DIAGNOSIS — F329 Major depressive disorder, single episode, unspecified: Secondary | ICD-10-CM | POA: Diagnosis not present

## 2017-05-18 DIAGNOSIS — M170 Bilateral primary osteoarthritis of knee: Secondary | ICD-10-CM | POA: Insufficient documentation

## 2017-05-18 DIAGNOSIS — G894 Chronic pain syndrome: Secondary | ICD-10-CM | POA: Diagnosis not present

## 2017-05-18 DIAGNOSIS — R200 Anesthesia of skin: Secondary | ICD-10-CM | POA: Diagnosis not present

## 2017-05-18 DIAGNOSIS — Z7952 Long term (current) use of systemic steroids: Secondary | ICD-10-CM | POA: Insufficient documentation

## 2017-05-18 DIAGNOSIS — M5136 Other intervertebral disc degeneration, lumbar region: Secondary | ICD-10-CM

## 2017-05-18 DIAGNOSIS — K5900 Constipation, unspecified: Secondary | ICD-10-CM | POA: Diagnosis not present

## 2017-05-18 DIAGNOSIS — Z7982 Long term (current) use of aspirin: Secondary | ICD-10-CM | POA: Insufficient documentation

## 2017-05-18 DIAGNOSIS — I10 Essential (primary) hypertension: Secondary | ICD-10-CM | POA: Diagnosis not present

## 2017-05-18 DIAGNOSIS — D860 Sarcoidosis of lung: Secondary | ICD-10-CM | POA: Insufficient documentation

## 2017-05-18 DIAGNOSIS — R1084 Generalized abdominal pain: Secondary | ICD-10-CM | POA: Diagnosis not present

## 2017-05-18 DIAGNOSIS — M25561 Pain in right knee: Secondary | ICD-10-CM | POA: Insufficient documentation

## 2017-05-18 DIAGNOSIS — F411 Generalized anxiety disorder: Secondary | ICD-10-CM | POA: Insufficient documentation

## 2017-05-18 DIAGNOSIS — M5441 Lumbago with sciatica, right side: Secondary | ICD-10-CM

## 2017-05-18 MED ORDER — OXYCODONE-ACETAMINOPHEN 10-325 MG PO TABS: 1.0000 | ORAL_TABLET | Freq: Four times a day (QID) | ORAL | 0 refills | Status: DC | PRN

## 2017-05-18 MED ORDER — GABAPENTIN 600 MG PO TABS: 600.0000 mg | ORAL_TABLET | Freq: Three times a day (TID) | ORAL | 0 refills | Status: DC

## 2017-05-18 NOTE — Progress Notes (Signed)
Nursing Pain Medication Assessment:  Safety precautions to be maintained throughout the outpatient stay will include: orient to surroundings, keep bed in low position, maintain call bell within reach at all times, provide assistance with transfer out of bed and ambulation.  Medication Inspection Compliance: [name] did not comply with our request to bring his pills to be counted. He was reminded that bringing the medication bottles, even when empty, is a requirement.  Medication: None brought in. Pill/Patch Count: None available to be counted. Bottle Appearance: No container available. Did not bring bottle(s) to appointment. Filled Date: N/A Last Medication intake:  Yesterday

## 2017-05-18 NOTE — Progress Notes (Signed)
Patient's Name: Douglas Andrews  MRN: [id number]  Referring Provider: Fritzi Mandes, MD  DOB: [birth year]  PCP: Fritzi Mandes, MD  DOS: 05/18/2017  Note by: Gaspar Cola, MD  Service setting: Ambulatory outpatient  Specialty: Interventional Pain Management  Location: [facility] (AMB) Pain Management Facility    Patient type: Established   Primary Reason(s) for Visit: Encounter for prescription drug management & post-procedure evaluation of chronic illness with mild to moderate exacerbation(Level of risk: moderate) CC: Back Pain (lower bilateral)  HPI  Douglas Andrews is a 66 y.o. year old, male patient, who comes today for a post-procedure evaluation and medication management. He has Chronic low back pain (Primary Source of Pain) (Bilateral) (L>R); Chronic pain syndrome;  Lumbar DDD (degenerative disc disease);  Lumbar Spondylosis; Lumbar radicular pain (Right) (L5 Dermatome); Lumbar facet syndrome (Bilateral) (R>L); Opiate use (60 MME/Day); Uncomplicated opioid dependence ([place]); Long term prescription opiate use; Chronic knee pain (Tertiary source of pain) (Bilateral) (Right); Vitamin D insufficiency; Overweight; Bell palsy; CN (constipation); Dermatitis, eczematoid; Diabetes mellitus ([place]); Dysmetria; Anxiety, generalized; Insomnia due to medical condition; Long term current use of systemic steroids; Type 2 diabetes mellitus ([place]); Pulmonary sarcoidosis ([place]); Arthropathy, traumatic, shoulder; Bursitis, trochanteric; Esophageal varices ([place]); Leg varices; Hepatic cirrhosis ([place]); Major depressive disorder with single episode; Other long term (current) drug therapy; At risk for falling; Encounter for chronic pain management; Encounter for therapeutic drug level monitoring; Chronic lower extremity pain (Secondary source of pain) (Right); Chronic lumbar radicular pain (Left) (L5 Dermatome); Lumbar Levoscoliosis; Neurogenic pain; Osteoarthritis of knee (Right); At high risk for falls; Chest pain; Skin lesion;  Pain of left femur (left femoral rod due to sarcoidosis); Chronic hip pain (Right); Osteoarthritis of knee (Bilateral) (R>L); Essential hypertension; Folliculitis; Chronic sacroiliac joint pain (Left); Chronic hip pain (Left); Chronic thoracic back pain (Left); Diarrhea; Numbness on left side; Gastroenteritis; Other fatigue; Urinary incontinence without sensory awareness; Balanitis; Generalized abdominal pain; and Hyperglycemic crisis in diabetes mellitus ([place]) on his problem list. His primarily concern today is the Back Pain (lower bilateral)  Pain Assessment: Location: Lower Back Radiating: na Onset: More than a month ago Duration: Chronic pain Quality: Aching Severity: 0-No pain/10 (self-reported pain score)  Note: Reported level is compatible with observation.                   Effect on ADL: sometimes patient is unable to stand and shave and grocery shopping is difficult Timing: Intermittent Modifying factors: medications, hot bath  Douglas Andrews was last seen on 03/22/2017 for a procedure. During today's appointment we reviewed [name] post-procedure results, as well as his outpatient medication regimen.  Further details on both, my assessment(s), as well as the proposed treatment plan, please see below.  Controlled Substance Pharmacotherapy Assessment REMS (Risk Evaluation and Mitigation Strategy)  Analgesic: Oxycodone/APAP 10/325 one every 6 hours (40 mg/day) MME/day:60 mg/day Morley Kos, RN  05/18/2017  1:57 PM  Signed Safety precautions to be maintained throughout the outpatient stay will include: orient to surroundings, keep bed in low position, maintain call bell within reach at all times, provide assistance with transfer out of bed and ambulation.   Janett Billow, RN  05/18/2017  1:14 PM  Sign at close encounter Nursing Pain Medication Assessment:  Safety precautions to be maintained throughout the outpatient stay will include: orient to surroundings, keep bed in low  position, maintain call bell within reach at all times, provide assistance with transfer out of bed and ambulation.  Medication Inspection Compliance:  Douglas Andrews did not comply with our request to bring his pills to be counted. He was reminded that bringing the medication bottles, even when empty, is a requirement.  Medication: None brought in. Pill/Patch Count: None available to be counted. Bottle Appearance: No container available. Did not bring bottle(s) to appointment. Filled Date: N/A Last Medication intake:  Yesterday   Pharmacokinetics: Liberation and absorption (onset of action): WNL Distribution (time to peak effect): WNL Metabolism and excretion (duration of action): WNL         Pharmacodynamics: Desired effects: Analgesia: Douglas Andrews reports >50% benefit. Functional ability: Patient reports that medication allows him to accomplish basic ADLs Clinically meaningful improvement in function (CMIF): Sustained CMIF goals met Perceived effectiveness: Described as relatively effective, allowing for increase in activities of daily living (ADL) Undesirable effects: Side-effects or Adverse reactions: None reported Monitoring:  PMP: Online review of the past 14-monthperiod conducted. Compliant with practice rules and regulations List of all UDS test(s) done:  Lab Results  Component Value Date   TOXASSSELUR FINAL 11/29/2016   [place] 03/30/2016   TOXASSSELUR FINAL 11/19/2015   TOXASSSELUR FINAL 10/07/2015   Last UDS on record: ToxAssure Select 13  Date Value Ref Range Status  11/29/2016 FINAL  Final    Comment:    ==================================================================== TOXASSURE SELECT 13 (MW) ==================================================================== Test                             Result       Flag       Units Drug Present and Declared for Prescription Verification   Oxycodone                      150          EXPECTED   ng/mg creat    Noroxycodone                   646          EXPECTED   ng/mg creat    Sources of oxycodone include scheduled prescription medications.    Noroxycodone is an expected metabolite of oxycodone. ==================================================================== Test                      Result    Flag   Units      Ref Range   Creatinine              56               mg/dL      >=20 ==================================================================== Declared Medications:  The flagging and interpretation on this report are based on the  following declared medications.  Unexpected results may arise from  inaccuracies in the declared medications.  **Note: The testing scope of this panel includes these medications:  Oxycodone (Percocet)  **Note: The testing scope of this panel does not include following  reported medications:  Acetaminophen (Percocet)  Albuterol  Atorvastatin (Lipitor)  Cetirizine (Zyrtec)  Fluoxetine (Prozac)  Gabapentin  Hydroxyzine (Vistaril)  Insulin (Lantus)  Isosorbide (Imdur)  Liraglutide (Victoza)  Lisinopril  Metformin  Nitroglycerin  Omeprazole (Prilosec)  Supplement (Probiotic)  Vitamin D3 ==================================================================== For clinical consultation, please call ([phone] ====================================================================    UDS interpretation: Compliant          Medication Assessment Form: Reviewed. Patient indicates being compliant with therapy Treatment compliance: Compliant Risk Assessment Profile: Aberrant behavior: See prior evaluations. None  observed or detected today Comorbid factors increasing risk of overdose: See prior notes. No additional risks detected today Risk of substance use disorder (SUD): Low     Opioid Risk Tool - 05/18/17 1315      Family History of Substance Abuse   Alcohol Negative   Illegal Drugs Negative   Rx Drugs Negative     Personal History of Substance  Abuse   Alcohol Negative   Illegal Drugs Negative   Rx Drugs Negative     Psychological Disease   Psychological Disease Positive   ADD Negative   OCD Negative   Bipolar Negative   Schizophrenia Negative   Depression Positive  currently taking prozac prn     Total Score   Opioid Risk Tool Scoring 3   Opioid Risk Interpretation Low Risk     ORT Scoring interpretation table:  Score <3 = Low Risk for SUD  Score between 4-7 = Moderate Risk for SUD  Score >8 = High Risk for Opioid Abuse   Risk Mitigation Strategies:  Patient Counseling: Covered Patient-Prescriber Agreement (PPA): Present and active  Notification to other healthcare providers: Done  Pharmacologic Plan: No change in therapy, at this time  Post-Procedure Assessment  03/22/2017 Procedure: Diagnostic bilateral lumbar facet block under fluoroscopic guidance, No sedation Pre-procedure pain score:  5/10 Post-procedure pain score: 0/10 (100% relief) Influential Factors: BMI: 24.18 kg/m Intra-procedural challenges: None observed.         Assessment challenges: None detected.              Reported side-effects: None.        Post-procedural adverse reactions or complications: None reported         Sedation: No sedation used. When no sedatives are used, the analgesic levels obtained are directly associated to the effectiveness of the local anesthetics. However, when sedation is provided, the level of analgesia obtained during the initial 1 hour following the intervention, is believed to be the result of a combination of factors. These factors may include, but are not limited to: 1. The effectiveness of the local anesthetics used. 2. The effects of the analgesic(s) and/or anxiolytic(s) used. 3. The degree of discomfort experienced by the patient at the time of the procedure. 4. The patients ability and reliability in recalling and recording the events. 5. The presence and influence of possible secondary gains and/or  psychosocial factors. Reported result: Relief experienced during the 1st hour after the procedure: 100 % (Ultra-Short Term Relief)            Interpretative annotation: Clinically appropriate result. Analgesia during this period is likely to be Local Anesthetic and/or IV Sedative (Analgesic/Anxiolytic) related.          Effects of local anesthetic: The analgesic effects attained during this period are directly associated to the localized infiltration of local anesthetics and therefore cary significant diagnostic value as to the etiological location, or anatomical origin, of the pain. Expected duration of relief is directly dependent on the pharmacodynamics of the local anesthetic used. Long-acting (4-6 hours) anesthetics used.  Reported result: Relief during the next 4 to 6 hour after the procedure: 100 % (Short-Term Relief)            Interpretative annotation: Clinically appropriate result. Analgesia during this period is likely to be Local Anesthetic-related.          Long-term benefit: Defined as the period of time past the expected duration of local anesthetics (1 hour for short-acting and 4-6 hours for long-acting).  With the possible exception of prolonged sympathetic blockade from the local anesthetics, benefits during this period are typically attributed to, or associated with, other factors such as analgesic sensory neuropraxia, antiinflammatory effects, or beneficial biochemical changes provided by agents other than the local anesthetics.  Reported result: Extended relief following procedure: 75 % (Long-Term Relief)            Interpretative annotation: Clinically appropriate result. Good relief. No permanent benefit expected. Inflammation plays a part in the etiology to the pain.          Current benefits: Defined as persistent relief that continues at this point in time.   Reported results: Treated area: 75 % Mr. Brame reports improvement in function Interpretative annotation: Recurrence  of symptoms. No permanent benefit expected. Effective diagnostic intervention.          Interpretation: Results would suggest a successful diagnostic intervention. We'll proceed with diagnostic intervention #2, as soon as convenient          Plan:  Proceed with diagnostic procedure # 2.  Laboratory Chemistry  Inflammation Markers (CRP: Acute Phase) (ESR: Chronic Phase) Lab Results  Component Value Date   CRP <0.5 03/30/2016   ESRSEDRATE 1 03/30/2016                 Renal Function Markers Lab Results  Component Value Date   BUN 12 03/30/2016   CREATININE 0.85 03/30/2016   GFRAA >60 03/30/2016   GFRNONAA >60 03/30/2016                 Hepatic Function Markers Lab Results  Component Value Date   AST 24 03/30/2016   ALT 27 03/30/2016   ALBUMIN 4.6 03/30/2016   ALKPHOS 63 03/30/2016                 Electrolytes Lab Results  Component Value Date   NA 136 03/30/2016   K 3.8 03/30/2016   CL 100 (L) 03/30/2016   CALCIUM 9.2 03/30/2016   MG 2.2 03/30/2016                 Neuropathy Markers Lab Results  Component Value Date   WTUUEKCM03 491 03/30/2016                 Bone Pathology Markers Lab Results  Component Value Date   ALKPHOS 63 03/30/2016   25OHVITD1 38 03/30/2016   25OHVITD2 3.4 03/30/2016   25OHVITD3 35 03/30/2016   CALCIUM 9.2 03/30/2016                 Coagulation Parameters No results found for: INR, LABPROT, APTT, PLT               Cardiovascular Markers No results found for: BNP, HGB, HCT               Note: Lab results reviewed.  Recent Diagnostic Imaging Review  Dg C-arm 1-60 Min-no Report  Result Date: 03/22/2017 Fluoroscopy was utilized by the requesting physician.  No radiographic interpretation.   Note: Imaging results reviewed.          Meds   Current Meds  Medication Sig  . albuterol (PROAIR HFA) 108 (90 Base) MCG/ACT inhaler INHALE 2 PUFFS BY MOUTH EVERY 4 HOURS ASNEEDED FOR WHEEZING  . aspirin EC 81 MG tablet Take 81 mg by  mouth.  Marland Kitchen atorvastatin (LIPITOR) 40 MG tablet Take 40 mg by mouth daily.  . cetirizine (ZYRTEC) 10 MG tablet Take 10 mg by  mouth daily.   . Cholecalciferol (VITAMIN D3) 5000 units TABS Take 1 tablet by mouth daily.   Marland Kitchen FLUoxetine (PROZAC) 20 MG capsule Take 20 mg by mouth daily.   Derrill Memo ON 06/02/2017] gabapentin (NEURONTIN) 600 MG tablet Take 1 tablet (600 mg total) by mouth every 8 (eight) hours.  Marland Kitchen glucose blood (ACCU-CHEK ACTIVE STRIPS) test strip Checks 3 times daily  . hydrOXYzine (VISTARIL) 25 MG capsule Take 50 mg by mouth every 6 (six) hours as needed.   . Ibuprofen-Diphenhydramine Cit (ADVIL PM PO) Take 1 tablet by mouth at bedtime.  . insulin glargine (LANTUS) 100 UNIT/ML injection Inject 60 Units into the skin at bedtime.   . insulin lispro (HUMALOG) 100 UNIT/ML injection Inject 18 Units into the skin 3 (three) times daily with meals.  . Insulin Pen Needle (UNIFINE PENTIPS) 31G X 6 MM MISC   . isosorbide mononitrate (IMDUR) 30 MG 24 hr tablet 30 mg daily.   Marland Kitchen lisinopril (PRINIVIL,ZESTRIL) 10 MG tablet Take 10 mg by mouth daily.   . metFORMIN (GLUCOPHAGE) 500 MG tablet Take 1,000 mg by mouth 2 (two) times daily with a meal.  . nitroGLYCERIN (NITROSTAT) 0.4 MG SL tablet Place 0.4 mg under the tongue every 5 (five) minutes as needed for chest pain.  Derrill Memo ON 07/02/2017] oxyCODONE-acetaminophen (PERCOCET) 10-325 MG tablet Take 1 tablet by mouth every 6 (six) hours as needed for pain.  Derrill Memo ON 06/02/2017] oxyCODONE-acetaminophen (PERCOCET) 10-325 MG tablet Take 1 tablet by mouth every 6 (six) hours as needed for pain.  Derrill Memo ON 08/01/2017] oxyCODONE-acetaminophen (PERCOCET) 10-325 MG tablet Take 1 tablet by mouth every 6 (six) hours as needed for pain.  Marland Kitchen VICTOZA 18 MG/3ML SOPN Inject into the skin 3 (three) times daily.   . [DISCONTINUED] gabapentin (NEURONTIN) 600 MG tablet Take 1 tablet (600 mg total) by mouth every 8 (eight) hours.  . [DISCONTINUED] oxyCODONE-acetaminophen  (PERCOCET) 10-325 MG tablet Take 1 tablet by mouth every 6 (six) hours as needed for pain.    ROS  Constitutional: Denies any fever or chills Gastrointestinal: No reported hemesis, hematochezia, vomiting, or acute GI distress Musculoskeletal: Denies any acute onset joint swelling, redness, loss of ROM, or weakness Neurological: No reported episodes of acute onset apraxia, aphasia, dysarthria, agnosia, amnesia, paralysis, loss of coordination, or loss of consciousness  Allergies  Mr. Petit is allergic to sertraline.  [place]  Drug: Mr. Nanna  reports that he does not use drugs. Alcohol:  reports that he does not drink alcohol. Tobacco:  reports that he has never smoked. He has never used smokeless tobacco. Medical:  has a past medical history of Chronic back pain; Diabetes mellitus without complication ([place]); Hyperlipidemia; Hypertension; Left leg pain (01/02/2014); Right leg pain (07/15/2015); and Sarcoidosis. Surgical: Mr. Adinolfi  has a past surgical history that includes Appendectomy; Tonsillectomy; and left rod. Family: family history includes Diabetes in his father; Heart disease in his father and mother; Stroke in his father.  Constitutional Exam  General appearance: Well nourished, well developed, and well hydrated. In no apparent acute distress Vitals:   05/18/17 1306  BP: (!) 124/96  Pulse: 84  Resp: 16  Temp: 97.8 F (36.6 C)  TempSrc: Oral  SpO2: 98%  Weight: 161 lb 6.4 oz (73.2 kg)  Height: 5' 8.5" (1.74 m)   BMI Assessment: Estimated body mass index is 24.18 kg/m as calculated from the following:   Height as of this encounter: 5' 8.5" (1.74 m).   Weight as of  this encounter: 161 lb 6.4 oz (73.2 kg).  BMI interpretation table: BMI level Category Range association with higher incidence of chronic pain  <18 kg/m2 Underweight   18.5-24.9 kg/m2 Ideal body weight   25-29.9 kg/m2 Overweight Increased incidence by 20%  30-34.9 kg/m2 Obese (Class I) Increased incidence by  68%  35-39.9 kg/m2 Severe obesity (Class II) Increased incidence by 136%  >40 kg/m2 Extreme obesity (Class III) Increased incidence by 254%   BMI Readings from Last 4 Encounters:  05/18/17 24.18 kg/m  03/22/17 25.24 kg/m  02/23/17 23.82 kg/m  01/25/17 25.85 kg/m   Wt Readings from Last 4 Encounters:  05/18/17 161 lb 6.4 oz (73.2 kg)  03/22/17 166 lb (75.3 kg)  02/23/17 159 lb (72.1 kg)  01/25/17 170 lb (77.1 kg)  Psych/Mental status: Alert, oriented x 3 (person, place, & time)       Eyes: PERLA Respiratory: No evidence of acute respiratory distress  Cervical Spine Area Exam  Skin & Axial Inspection: No masses, redness, edema, swelling, or associated skin lesions Alignment: Symmetrical Functional ROM: Unrestricted ROM      Stability: No instability detected Muscle Tone/Strength: Functionally intact. No obvious neuro-muscular anomalies detected. Sensory (Neurological): Unimpaired Palpation: No palpable anomalies              Upper Extremity (UE) Exam    Side: Right upper extremity  Side: Left upper extremity  Skin & Extremity Inspection: Skin color, temperature, and hair growth are WNL. No peripheral edema or cyanosis. No masses, redness, swelling, asymmetry, or associated skin lesions. No contractures.  Skin & Extremity Inspection: Skin color, temperature, and hair growth are WNL. No peripheral edema or cyanosis. No masses, redness, swelling, asymmetry, or associated skin lesions. No contractures.  Functional ROM: Unrestricted ROM          Functional ROM: Unrestricted ROM          Muscle Tone/Strength: Functionally intact. No obvious neuro-muscular anomalies detected.  Muscle Tone/Strength: Functionally intact. No obvious neuro-muscular anomalies detected.  Sensory (Neurological): Unimpaired  Sensory (Neurological): Unimpaired  Palpation: No palpable anomalies              Palpation: No palpable anomalies              Specialized Test(s): Deferred         Specialized Test(s):  Deferred          Thoracic Spine Area Exam  Skin & Axial Inspection: No masses, redness, or swelling Alignment: Symmetrical Functional ROM: Unrestricted ROM Stability: No instability detected Muscle Tone/Strength: Functionally intact. No obvious neuro-muscular anomalies detected. Sensory (Neurological): Unimpaired Muscle strength & Tone: No palpable anomalies  Lumbar Spine Area Exam  Skin & Axial Inspection: No masses, redness, or swelling Alignment: Symmetrical Functional ROM: Improved after treatment      Stability: No instability detected Muscle Tone/Strength: Functionally intact. No obvious neuro-muscular anomalies detected. Sensory (Neurological): Movement-associated pain Palpation: Complains of area being tender to palpation       Provocative Tests: Lumbar Hyperextension and rotation test: Positive bilaterally for facet joint pain. Lumbar Lateral bending test: evaluation deferred today       Patrick's Maneuver: evaluation deferred today                    Gait & Posture Assessment  Ambulation: Unassisted Gait: Relatively normal for age and body habitus Posture: WNL   Lower Extremity Exam    Side: Right lower extremity  Side: Left lower extremity  Skin & Extremity Inspection:  Skin color, temperature, and hair growth are WNL. No peripheral edema or cyanosis. No masses, redness, swelling, asymmetry, or associated skin lesions. No contractures.  Skin & Extremity Inspection: Skin color, temperature, and hair growth are WNL. No peripheral edema or cyanosis. No masses, redness, swelling, asymmetry, or associated skin lesions. No contractures.  Functional ROM: Unrestricted ROM          Functional ROM: Unrestricted ROM          Muscle Tone/Strength: Functionally intact. No obvious neuro-muscular anomalies detected.  Muscle Tone/Strength: Functionally intact. No obvious neuro-muscular anomalies detected.  Sensory (Neurological): Unimpaired  Sensory (Neurological): Unimpaired   Palpation: No palpable anomalies  Palpation: No palpable anomalies   Assessment  Primary Diagnosis & Pertinent Problem List: The primary encounter diagnosis was Chronic pain syndrome. Diagnoses of Lumbar facet syndrome (Bilateral) (R>L),  Lumbar DDD (degenerative disc disease), Chronic low back pain (Location of Primary Source of Pain) (Bilateral) (L>R), Long term prescription opiate use, Opiate use (60 MME/Day), and Neurogenic pain were also pertinent to this visit.  Status Diagnosis  Controlled Improved Controlled 1. Chronic pain syndrome   2. Lumbar facet syndrome (Bilateral) (R>L)   3.  Lumbar DDD (degenerative disc disease)   4. Chronic low back pain (Location of Primary Source of Pain) (Bilateral) (L>R)   5. Long term prescription opiate use   6. Opiate use (60 MME/Day)   7. Neurogenic pain     Problems updated and reviewed during this visit: Problem  Generalized Abdominal Pain   Last Assessment & Plan:  66 yo male with hx of poorly controlled type 2 DM, liver cirrhosis, s/p corticosteroid injections for chronic back pain last week, presented to clinic with abdominal pain, penile pain, new onset urinary incontinence and hyperglycemia.  He had +++ glucose in urine, new incontinence likely related to hyperglycemia (541 in clinic today, with reports of glucose in the 600-700's over the weekend).  He has + guarding and rebound in his abdominal exam and in the setting of sarcoidosis with hx of liver cirrhosis, concern with ascites and/or intraabdominal process. He also has balanitis, related to hyperglycemia which has led to urinary incontinence, no signs of strangulation.   Will refer to hospital for hyperglycemic management, rule out DKA, abdominal pain work up, likely requiring imaging, and potential treatment of balanitis depending on LOS at the hospital.    Chronic lower extremity pain (Secondary source of pain) (Right)  Chronic low back pain (Primary Source of Pain) (Bilateral)  (L>R)  Lumbar facet syndrome (Bilateral) (R>L)  Chronic knee pain (Tertiary source of pain) (Bilateral) (Right)   On 01/05/2016 the patient had an intra-articular knee injection using local anesthetics and steroids. Following the injection the patient appeared to have no benefit suggesting that the knee pain may be coming from his lower back problems and not from the knee itself. We will review this when he comes back for his postprocedure evaluation.   Numbness On Left Side  Urinary Incontinence Without Sensory Awareness   Last Assessment & Plan:  This is likely related to increased glycemia recently.  He had a CS injection of his back and recent blood sugars have been over 500.  His increased urinary frequency and incontinence match up with this injection.  Recommended glycemic control (increasing the victoza from 0.6 mg to 1.4m which had been endocrine recs, but pt did not understand).  U/A not suggestive of UTI (only 2 + glucose), and reassured that his symptoms are not caused by his lumbar injections.  If urinary symptoms do not improve with glycemic control will move forward with urology consultation.    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    Other Long Term (Current) Drug Therapy   Last Assessment & Plan:  Formatting of this note may be different from the original. Pt is tolerating current medication(s) well. Medication: continue atorvastatin at same dose. (40 mg nightly) Plan: Current treatment plan is effective, no change in therapy. Lab Results  Component Value Date   LDL 103 04/09/2014   LDL 90 11/06/2012   LDL 87 02/24/2012   HDL 73* 04/09/2014   HDL 57 11/06/2012   TRIG 92 04/09/2014   TRIG 101 02/24/2012   The 10-year ASCVD risk  score Mikey Bussing DC Jr., et al., 2013) is: 21%   Values used to calculate the score:     Age: 46 years     Sex: Male     Is an African American: No     Diabetic: Yes     Tobacco smoker: No     Systolic Blood Pressure: 676 mmHg     Prescribed Antihypertensives: Yes     HDL Cholesterol: 73 mg/dL     Total Cholesterol: 194 mg/dL   Balanitis   Last Assessment & Plan:  Resolved. Advised to restart clotrimazole and/or nystatin with rash recurrence, which has effectively treated most recent episode.   Hyperglycemic Crisis in Diabetes Mellitus (Hcc)  Other Fatigue   Last Assessment & Plan:  Likely due to taking more than prescribed dose of Victoza.  Ordered labs as below.    Gastroenteritis   Last Assessment & Plan:  Likely related to undercooked food. Symptoms resolving. Able to keep fluids down and endorses appetite. Discussed BRAT diet and ADAT. Provided zofran prn, home care instructions, and return precautions. Discussed possibility of gastric emptying study if symptoms persist. Recent EGD with erosive gastropathy.   Diarrhea   Last Assessment & Plan:   Advised BRAT diet with plain tea, plain baked chicken breast, plain simple carbs. As improving, slowly reincorporate foods as tolerated.   Push fluids; advised Gatorade or Pedialyte without food coloring.   Prescribed probiotic BID for 2 weeks following diarrhea cessation to reconstitute intestine.   Pt has had diarrhea for >3 weeks with weight loss and abdominal pain to palpation. His weight was 185 lbs on 12/19 and down to 172 lbs on 1/31. Referred pt to GI. Ordered labs as below.    Folliculitis   Last Assessment & Plan:  Concern for MRSA - Will treat with Bactrim Sent off for culture Follow up if SWOP   Chest Pain   Last Assessment & Plan:  EKG normal, NSR, no ST segment changes, no LVH. Pt was referred to the cardiologist last year for angina, but they were unable to contact Pt to schedule.  Advised Pt that his chest  pain is concerning and that he needs to be seen by cardiology. Re-referred today. Advised Pt that cardiology may want echo and catheterization.    Skin Lesion   Last Assessment & Plan:   Papular lesion has a pearly apex on erythematous base, about 8 mm diameter, on helix of right ear. Unclear if tip is purulent material or skin.  Referred to dermatology.  Advised Pt to not pick  at his ear lesion.    Overweight  Cn (Constipation)   Last Assessment & Plan:  Most likely opioid-induced constipation given morphine and percocet prescription for back pain.  Advised patient to continue to drink plenty of fluids, use stool softeners, and to use motility agent such as miralax or senna.     Dermatitis, Eczematoid   Last Assessment & Plan:  Completed doxy, prednisone and hydroxizine from ED, with no improvement.  Will refer to dermatology.  Seems like contact dermatitis vs. Medication reaction???   Type 2 Diabetes Mellitus (Hcc)   Last Assessment & Plan:  See poorly controlled diabetes mellitus tab. Last Assessment & Plan:  See Diabetes mellitus with complication tab. Last Assessment & Plan:  Formatting of this note may be different from the original. Diabetes isn't at goal of A1C <8.0, but is fairly stable. Goal is to avoid extreme highs and lows and keep sugar in 70-300 range.  Lab Results  Component Value Date   A1C 9.0* 01/08/2016   A1C 8.9* 11/09/2015   A1C 8.9* 09/29/2015   A1C 8.9* 02/13/2014   A1C 7.9 11/06/2012   A1C 7.5 08/03/2012   A1C 7.8 04/09/2012   MICROALBQTUR <0.6 10/28/2014   Pt is tolerating current medication(s) well. Medication:  Take 18 U Humalog and 500 mg metformin, then eat breakfast around 7-7:30 AM.  Take 500 mg metformin with a snack such as crackers or an apple with peanut butter around 11 AM.  Take 18 U Humalog and 1000 mg metformin around 5-6; then eat dinner.   Take lantus at 54 units nightly up from 52 units patient had been taking.   Continue  short acting insulin sliding scale as follows: 150-200 take 18 U, no need to recheck until next meal 201-250 take 19 Units no need to recheck until next meal 251-300 take 20 no need to recheck until next meal  If your sugar is  >300  take 21 units  Recheck sugar in 1-2 hours   201-250 take 4 251-300 take 5 300- 399 take 6 units 400-499- take 8 units >500-- take 10 units   If sugar higher than 500  recheck in 2 hours   201-250 take 4 251-300 take 5 300- 399 take 6 units 400-499- take 8 units >500-- take 10 units   Plan: Follow medication adjustments as above.  Eat a meal 3 times a day. Continue to follow with dietician and make healthy choices. Applauded Pt giving up [employer].  Cautioned Pt against going to grocery store hungry.  Health maintenance: Last foot exam: 06/22/15 Last retinal exam: 06/05/15 Last flu vaccine: 06/22/15 Last Assessment & Plan:  Formatting of this note may be different from the original. Diabetes isn't at goal of A1C <7.0.  Lab Results  Component Value Date   A1C 9.5* 04/17/2015   A1C 8.9* 02/13/2014   A1C 7.9 11/06/2012   Pt is tolerating current medication(s) well. Medication: change medication from lantus 32 u  to 34 U. And novolog from 12 U to 14 U BID AC Sent message to community pharmacist Pt has appointment in 2 weeks with nutrition Continue night time snack, which has curbed the AM sugars down significantly Plan: Orders and follow up as documented in patient record. Specific diabetic recommendations: diabetic diet discussed in detail, written exchange diet given, home glucose monitoring emphasized and bring glucose log to next visit.    Anxiety, Generalized   Last Assessment & Plan:  Anxiety symptoms are stable  since last visit.  Patient is at goal. GAD-7 Score: Clinic Collected GAD-7 Total Score: 4 Medication: continue current medication at same dose.  Sertraline 67m QD Risk: Pt appears not to be at risk of self harm at this time.   Last Assessment & Plan:  Anxiety symptoms are improved since last visit. Patient at goal?: Unknown. GAD-7 Score:  N/A Medication: continue sertraline at same dose. (50 mg daily) Risk: Pt appears not to be at risk of self harm at this time.    Dysmetria  Leg Varices   Overview:  Ordering dopplers for both legs. 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 Positive DM  Last Assessment & Plan:  Ordering dopplers (venous) due to enlargement of the thighs bilat that comes and goes and now 1-2+ pitting edema lower legs. Has hx of sarcoid in the left hip.   Bell Palsy  Insomnia Due to Medical Condition  Esophageal Varices (Hcc)  Diabetes Mellitus (Hcc)   Last Assessment & Plan:  Formatting of this note may be different from the original. Diabetes isn't at goal of A1C <8.0, but is fairly stable. Goal is to avoid extreme highs and lows and keep sugar in 70-300 range. Per log, most sugars since last visit in 200s-370 range with 3 readings in the 90s. Lab Results  Component Value Date   A1C 8.9* 03/15/2016   A1C 9.0* 02/08/2016   A1C 9.0* 01/08/2016   A1C 8.9* 02/13/2014   A1C 7.9 11/06/2012   A1C 7.5 08/03/2012   A1C 7.8 04/09/2012   MICROALBQTUR <0.6 10/28/2014   Pt is tolerating current medication(s) well. Medication: Pt has increased daily prednisone (10 mg daily) and will be having additional steroid injections this week.   Take 25 U Humalog (up from 20 U) and 500 mg metformin, then eat breakfast around 7-7:30 AM.  Take 500 mg metformin with a snack such as crackers or an apple with peanut butter around 11 AM.  Take 25 U Humalog (up from 20 U) and 1000 mg metformin around 5-6; then eat dinner.   Continue lantus at 60 units nightly.   Continue short acting insulin sliding scale as follows: 150-200 take 18 U, no need to recheck until next meal 201-250 take 19 Units no need to recheck until next meal 251-300 take 20 no need to recheck until next meal  If your sugar is  >300  take  21 units  Recheck sugar in 1-2 hours   201-250 take 4 251-300 take 5 300- 399 take 6 units 400-499- take 8 units >500-- take 10 units   If sugar higher than 500  recheck in 2 hours   201-250 take 4 251-300 take 5 300- 399 take 6 units 400-499- take 8 units >500-- take 10 units   Plan:   Follow medication adjustments as above.   Eat a meal 3 times a day.  Continue to follow with dietician and make healthy choices.   Pt was previously referred Pt to [place] actively works with community pharmacist  Referred to endocrinology Health maintenance: Last foot exam: 06/22/15 Last retinal exam: 06/05/15 Last flu vaccine: 06/22/15 Last Assessment & Plan:  Formatting of this note may be different from the original. Diabetes isn't at goal of A1C <7.0, but per Pt's BG log (see scan in media) is improving. FBG range 180s-300s; postparandials usually in the 200s.  Lab Results  Component Value Date   A1C 9.3* 07/20/2015   A1C 8.3* 06/22/2015   A1C 9.0* 05/19/2015  A1C 8.9* 02/13/2014   A1C 7.9 11/06/2012   A1C 7.5 08/03/2012   A1C 7.8 04/09/2012   MICROALBQTUR <0.6 10/28/2014  Pt is tolerating current medication(s) well. Medication:  Increase lantus from 40 units nightly to 45 units nightly  Increase preprandial humalog from 16 units to 18 units BID (with breakfast and lunch)  If BG exceeds 400, inject 10 units humalog, recheck in 1-2 hours, if still over 400, inject additional 10 units, wait again; repeat until BG under 400.  Plan: Follow medication adjustments as above.  Attend group sessions for improved BG control as planned. (Dietician put Pt in trial.) Front desk provided Pt with direct number for nurse line.    Health maintenance: Last foot exam: 06/22/15 Last retinal exam: 06/05/15 Last flu vaccine: 06/22/15 Last Assessment & Plan:  Diabetes is cotninue lantus 27 U at night Add 10 U lantus in the am Increase novolog before dinner )biggest meal= to 12  U (up from 10) Add SS- if BS > 250- 350 before lunch increase to 12U, If bs >350 increase novolog to 14 U  Follow up in 1 month   Major Depressive Disorder With Single Episode   Last Assessment & Plan:  Depression symptoms are rapid improvement since last visit. Patient is at goal. PHQ-9 Score: Clinic Collected PHQ-9 Total Score : 4 (was 11 on 07/20/15) Medication: continue sertraline at same dose. (50 mg QD) Risk: Pt appears not to be at risk of self-harm at this time.    Essential Hypertension   Last Assessment & Plan:  Formatting of this note may be different from the original. Today's BP: 131/78 mmHg is at goal below 140/90. Hypertension is asymptomatic and reasonably well controlled. Pt is tolerating current medication(s) well. Medication: continue current medication at same dose.  Lisinopril 10 mg QD Plan: Orders and follow up as documented in patient record.  Lab Results  Component Value Date   NA 136 02/17/2016   NA 134* 01/20/2015   K 4.5 02/17/2016   K 4.3 01/20/2015   CREATININE 1.0 03/23/2016   CREATININE 0.87 02/17/2016   CREATININE 0.84 01/20/2015   BP Readings from Last 4 Encounters:  04/21/16 131/78  04/20/16 131/79  04/07/16 148/91  03/21/16 125/75     Plan of Care  Pharmacotherapy (Medications Ordered): Meds ordered this encounter  Medications  . oxyCODONE-acetaminophen (PERCOCET) 10-325 MG tablet    Sig: Take 1 tablet by mouth every 6 (six) hours as needed for pain.    Dispense:  120 tablet    Refill:  0    Do not place this medication, or any other prescription from our practice, on "Automatic Refill". Patient may have prescription filled one day early if pharmacy is closed on scheduled refill date. Do not fill until: 07/02/17 To last until: 08/01/17  . oxyCODONE-acetaminophen (PERCOCET) 10-325 MG tablet    Sig: Take 1 tablet by mouth every 6 (six) hours as needed for pain.    Dispense:  120 tablet    Refill:  0    Do not place this medication,  or any other prescription from our practice, on "Automatic Refill". Patient may have prescription filled one day early if pharmacy is closed on scheduled refill date. Do not fill until: 06/02/17 To last until: 07/02/17  . oxyCODONE-acetaminophen (PERCOCET) 10-325 MG tablet    Sig: Take 1 tablet by mouth every 6 (six) hours as needed for pain.    Dispense:  120 tablet    Refill:  0  Do not place this medication, or any other prescription from our practice, on "Automatic Refill". Patient may have prescription filled one day early if pharmacy is closed on scheduled refill date. Do not fill until: 08/01/17 To last until: 08/31/17  . gabapentin (NEURONTIN) 600 MG tablet    Sig: Take 1 tablet (600 mg total) by mouth every 8 (eight) hours.    Dispense:  270 tablet    Refill:  0    Do not place this medication, or any other prescription from our practice, on "Automatic Refill". Patient may have prescription filled one day early if pharmacy is closed on scheduled refill date.   New Prescriptions   No medications on file   Medications administered today: Mr. Deery had no medications administered during this visit.   Procedure Orders     LUMBAR FACET(MEDIAL BRANCH NERVE BLOCK) [facility] Lab Orders  No laboratory test(s) ordered today   Imaging Orders  No imaging studies ordered today   Referral Orders  No referral(s) requested today    Interventional management options: Planned, scheduled, and/or pending:   Diagnostic bilateral Lumbar facet block #2   Considering:   Diagnostic bilateral Lumbar facet block Possible bilateral lumbar facet radiofrequency ablation Diagnostic right intra-articular hip injection Possible radiofrequency of the right hip joint Diagnostic Right L4-5 lumbar epidural steroid injection Diagnostic bilateral intra-articular knee injection Possible series of 5 Hyalgan knee injections.  Diagnostic bilateral Genicular nerve block Possible bilateral Genicular  nerve radiofrequency ablation   Palliative PRN treatment(s):   Diagnostic bilateral Lumbar facet block #2 Diagnostic right intra-articular hip injection Diagnostic Right L4-5 lumbar epidural steroid injection Diagnostic bilateral intra-articular knee injection Possible series of 5 Hyalgan knee injections.  Diagnostic bilateral Genicular nerve block   Provider-requested follow-up: Return for PRN Procedure: (B) L_FCT BLK #2.  Future Appointments Date Time Provider [place]  08/28/2017 8:45 AM Vevelyn Francois, NP [facility] None   Primary Care Physician: Fritzi Mandes, MD Location: [facility] Note by: Gaspar Cola, MD Date: 05/18/2017; Time: 2:53 PM

## 2017-05-18 NOTE — Progress Notes (Signed)
Safety precautions to be maintained throughout the outpatient stay will include: orient to surroundings, keep bed in low position, maintain call bell within reach at all times, provide assistance with transfer out of bed and ambulation.  

## 2017-05-18 NOTE — Patient Instructions (Addendum)
____________________________________________________________________________________________  Preparing for Procedure with Sedation Instructions: . Oral Intake: Do not eat or drink anything for at least 8 hours prior to your procedure. . Transportation: Public transportation is not allowed. Bring an adult driver. The driver must be physically present in our waiting room before any procedure can be started. Marland Kitchen. Physical Assistance: Bring an adult physically capable of assisting you, in the event you need help. This adult should keep you company at home for at least 6 hours after the procedure. . Blood Pressure Medicine: Take your blood pressure medicine with a sip of water the morning of the procedure. . Blood thinners:  . Diabetics on insulin: Notify the staff so that you can be scheduled 1st case in the morning. If your diabetes requires high dose insulin, take only  of your normal insulin dose the morning of the procedure and notify the staff that you have done so. . Preventing infections: Shower with an antibacterial soap the morning of your procedure. . Build-up your immune system: Take 1000 mg of Vitamin C with every meal (3 times a day) the day prior to your procedure. Marland Kitchen. Antibiotics: Inform the staff if you have a condition or reason that requires you to take antibiotics before dental procedures. . Pregnancy: If you are pregnant, call and cancel the procedure. . Sickness: If you have a cold, fever, or any active infections, call and cancel the procedure. . Arrival: You must be in the facility at least 30 minutes prior to your scheduled procedure. . Children: Do not bring children with you. . Dress appropriately: Bring dark clothing that you would not mind if they get stained. . Valuables: Do not bring any jewelry or valuables. Procedure appointments are reserved for interventional treatments only. Marland Kitchen. No Prescription Refills. . No medication changes will be discussed during procedure  appointments. . No disability issues will be discussed. ____________________________________________________________________________________________  Prescriptions given for oxycodone x3

## 2017-05-25 ENCOUNTER — Encounter: Payer: Medicare Other | Admitting: Pain Medicine

## 2017-06-06 DIAGNOSIS — M47816 Spondylosis without myelopathy or radiculopathy, lumbar region: Secondary | ICD-10-CM | POA: Insufficient documentation

## 2017-06-06 NOTE — Progress Notes (Signed)
Patient's Name: Douglas Andrews  MRN: [id number]  Referring Provider: Fritzi Mandes, MD  DOB: 1951-01-09  PCP: Fritzi Mandes, MD  DOS: 06/07/2017  Note by: Gaspar Cola, MD  Service setting: Ambulatory outpatient  Specialty: Interventional Pain Management  Location: [facility] (AMB) Pain Management Facility    Patient type: Established   Primary Reason(s) for Visit: Evaluation of chronic illnesses with exacerbation, or progression (Level of risk: moderate) CC: Back Pain (lower ) and Leg Pain (both  more on the right than left)  HPI  Douglas Andrews is a 66 y.o. year old, male patient, who comes today for a follow-up evaluation. He has Chronic low back pain (Primary Source of Pain) (Bilateral) (L>R); Chronic pain syndrome;  Lumbar DDD (degenerative disc disease);  Lumbar Spondylosis; Lumbar radicular pain (Left) (L5 Dermatome); Lumbar facet syndrome (Bilateral) (R>L); Opiate use (60 MME/Day); Uncomplicated opioid dependence ([place]); Long term prescription opiate use; Chronic knee pain (Tertiary source of pain) (Bilateral) (Right); Vitamin D insufficiency; Overweight; Bell palsy; CN (constipation); Dermatitis, eczematoid; Diabetes mellitus ([place]); Dysmetria; Anxiety, generalized; Insomnia due to medical condition; Long term current use of systemic steroids; Type 2 diabetes mellitus ([place]); Pulmonary sarcoidosis ([place]); Arthropathy, traumatic, shoulder; Bursitis, trochanteric; Esophageal varices ([place]); Leg varices; Hepatic cirrhosis ([place]); Major depressive disorder with single episode; Other long term (current) drug therapy; At risk for falling; Encounter for chronic pain management; Encounter for therapeutic drug level monitoring; Chronic lumbar radicular pain (Left) (L5 Dermatome); Lumbar Levoscoliosis; Neurogenic pain; Osteoarthritis of knee (Right); At high risk for falls; Chest pain; Skin lesion; Pain of left femur (left femoral rod due to sarcoidosis); Chronic hip pain (Right); Osteoarthritis of knee  (Bilateral) (R>L); Essential hypertension; Folliculitis; Chronic sacroiliac joint pain (Left); Chronic hip pain (Left); Chronic thoracic back pain (Left); Diarrhea; Numbness on left side; Gastroenteritis; Other fatigue; Urinary incontinence without sensory awareness; Balanitis; Generalized abdominal pain; Hyperglycemic crisis in diabetes mellitus ([place]); Osteoarthritis of lumbar spine; Chronic lower extremity pain (Secondary Area of Pain) (Bilateral) (L>R); Retrolisthesis (2 mm) of L4 over L5; and Anterolisthesis (7 mm) L5 over S1 on his problem list. [name] was last seen on 05/18/2017. His primarily concern today is the Back Pain (lower ) and Leg Pain (both  more on the right than left)  Pain Assessment: Location: Lower Back Radiating: down the right hip and leg going to the inside thigh on the right leg; at times, has pain in the left front of the leg going all the way to the big toe Onset: More than a month ago Duration: Chronic pain Quality: Aching, Constant, Radiating Severity: 4 /10 (self-reported pain score)  Note: Reported level is inconsistent with clinical observations. Clinically the patient looks like a 3/10 Information on the proper use of the pain scale provided to the patient today Effect on ADL: pace self Timing: Constant Modifying factors: medicine helps, ice and heating pad  The patient indicates having done well until the bad weather started coming in and then his low back pain started getting worse again. He indicates that last night he had excruciating pain in both lower extremities with the pain on the right leg going around the groin area into the medial aspect of the thigh but not going below the knee. In the case of the left leg the pain went all the way down to the top of his foot and into his big toe lowing an L5 dermatomal distribution. In the case of the right leg it appears to be an L2 dermatomal distribution. This  seems to be progressing when compared to prior level of  pain and therefore we have decided to proceed with a lumbar MRI. In addition, the pain was described to be initially on the right leg and now it's on the left leg suggesting the possibility of some instability and therefore we will be ordering today an x-ray of the lumbar spine on flexion and extension to determine if there is any instability in his spine.  Because the patient did so well with prior diagnostic lumbar facet blocks, we will have him return for a third injection. Today I have also offered patient the possibility of radiofrequency of the lumbar facets and I have explained to him in detail what this involves. Today we will also provide him with some written information about it.  Further details on both, my assessment(s), as well as the proposed treatment plan, please see below.  Laboratory Chemistry  Inflammation Markers (CRP: Acute Phase) (ESR: Chronic Phase) Lab Results  Component Value Date   CRP <0.5 03/30/2016   ESRSEDRATE 1 03/30/2016                 Renal Function Markers Lab Results  Component Value Date   BUN 12 03/30/2016   CREATININE 0.85 03/30/2016   GFRAA >60 03/30/2016   GFRNONAA >60 03/30/2016                 Hepatic Function Markers Lab Results  Component Value Date   AST 24 03/30/2016   ALT 27 03/30/2016   ALBUMIN 4.6 03/30/2016   ALKPHOS 63 03/30/2016                 Electrolytes Lab Results  Component Value Date   NA 136 03/30/2016   K 3.8 03/30/2016   CL 100 (L) 03/30/2016   CALCIUM 9.2 03/30/2016   MG 2.2 03/30/2016                 Neuropathy Markers Lab Results  Component Value Date   XAJOINOM76 720 03/30/2016                 Bone Pathology Markers Lab Results  Component Value Date   ALKPHOS 63 03/30/2016   25OHVITD1 38 03/30/2016   25OHVITD2 3.4 03/30/2016   25OHVITD3 35 03/30/2016   CALCIUM 9.2 03/30/2016                 Coagulation Parameters No results found for: INR, LABPROT, APTT, PLT               Cardiovascular  Markers No results found for: BNP, HGB, HCT               Note: Lab results reviewed.  Recent Diagnostic Imaging Review  Dg C-arm 1-60 Min-no Report Result Date: 03/22/2017 Fluoroscopy was utilized by the requesting physician.  No radiographic interpretation.   Thoracic Imaging: Thoracic DG 2-3 views:  Results for orders placed during the hospital encounter of 08/22/16  DG Thoracic Spine 2 View   Narrative CLINICAL DATA:  Dorsalgia with several recent falls  EXAM: THORACIC SPINE 3 VIEWS  COMPARISON:  None.  FINDINGS: Frontal, lateral, and swimmer's views were obtained. There is mild lower thoracic dextroscoliosis. There is no evident fracture or spondylolisthesis. There is disc space narrowing at multiple levels in thoracic spine, primarily in the lower thoracic region. No erosive change or paraspinous lesion.  IMPRESSION: Mild scoliosis. Osteoarthritic change at several levels. No acute fracture or spondylolisthesis.   Electronically Signed  By: Lowella Grip III M.D.   On: 08/22/2016 10:35    Lumbosacral Imaging: Lumbar MR wo contrast:  Results for orders placed during the hospital encounter of 05/03/16  MR Lumbar Spine Wo Contrast   Narrative CLINICAL DATA:  Low back pain for over 10 years radiating into the right leg to the knee. No known injury. Initial encounter. EXAM: MRI LUMBAR SPINE WITHOUT CONTRAST TECHNIQUE: Multiplanar, multisequence MR imaging of the lumbar spine was performed. No intravenous contrast was administered. COMPARISON:  None. FINDINGS: Segmentation:  Unremarkable. Alignment: Trace anterolisthesis L5 on S1 due to facet degenerative disease is seen. Vertebrae: No fracture or worrisome marrow lesion. Scattered Schmorl's nodes are noted. Conus medullaris: Extends to the L1 level and appears normal. Paraspinal and other soft tissues: Unremarkable. Disc levels: T12-L1:  Negative. L1-2:  Negative. L2-3:  Negative. L3-4: Small right  paracentral disc protrusion causes mild narrowing the right lateral recess. The disc slightly deflects the descending right L4 root without compressing it. The foramina are open. L4-5: Mild ligamentum flavum thickening and facet degenerative disease. Minimal disc bulge without central canal or foraminal stenosis. L5-S1: There is facet degenerative change. The disc is uncovered with a shallow bulge. The central canal and foramina are widely patent. IMPRESSION: Small right side disc protrusion at L3-4 slightly deflects the descending right L4 root without compression. Mild disc bulging L4-5 and L5-S1 without central canal or foraminal stenosis. Electronically Signed   By: Inge Rise M.D.   On: 05/03/2016 09:17   Lumbar DG (Complete) 4+V:  Results for orders placed during the hospital encounter of 08/22/16  DG Lumbar Spine Complete   Narrative CLINICAL DATA:  Lumbago with several recent falls  EXAM: LUMBAR SPINE - COMPLETE 4+ VIEW  COMPARISON:  None.  FINDINGS: Frontal, lateral, spot lumbosacral lateral, and bilateral oblique views were obtained. There are 5 non-rib-bearing lumbar type vertebral bodies. T12 ribs are hypoplastic. There is mild lumbar levoscoliosis. There is no fracture. There is 2 mm of retrolisthesis of L4 on L5. There is 7 mm of anterolisthesis of L5 on S1. There is no other evident spondylolisthesis. There is an unfused apophysis along the anterior superior aspect of the L3 vertebral body. Pars defects are noted at L5 bilaterally. There is mild disc space narrowing at L4-5 and L5-S1. There is facet osteoarthritic change at L4-5 and L5-S1 bilaterally.  IMPRESSION: Port defects at L5 bilaterally with spondylolisthesis at L4-5 and L5-S1. No fracture. Osteoarthritic change at L4-5 and L5-S1.   Electronically Signed   By: Lowella Grip III M.D.   On: 08/22/2016 10:33    Sacroiliac Joint Imaging: Sacroiliac Joint DG:  Results for orders placed  during the hospital encounter of 08/22/16  DG Si Joints   Narrative CLINICAL DATA:  Pain with several recent falls  EXAM: BILATERAL SACROILIAC JOINTS - 3+ VIEW  COMPARISON:  None.  FINDINGS: Frontal as well as bilateral oblique views were obtained. There is mild osteoarthritic change in each sacroiliac joint. No erosive change. No fracture or diastases.  IMPRESSION: No fracture or diastases. Mild osteoarthritic change in each sacroiliac joint. No sacroiliitis evident.   Electronically Signed   By: Lowella Grip III M.D.   On: 08/22/2016 10:34    Hip Imaging: Hip-L DG 2-3 views:  Results for orders placed during the hospital encounter of 08/22/16  DG HIP UNILAT W OR W/O PELVIS 2-3 VIEWS LEFT   Narrative CLINICAL DATA:  Pain with several recent falls  EXAM: DG HIP (WITH OR WITHOUT PELVIS)  2-3V LEFT  COMPARISON:  None.  FINDINGS: Frontal pelvis as well as frontal and lateral left hip images were obtained. There is postoperative change on the left. There is myositis ossifications superior to the intertrochanteric region on the left. No acute fracture or dislocation is evident. There is slight symmetric narrowing both hip joints. No erosive change. There is atherosclerotic calcification in both superficial femoral arteries.  IMPRESSION: Postoperative change on the left. Mild symmetric narrowing of both hip joints. No acute fracture or dislocation. Superficial femoral artery atherosclerotic change.   Electronically Signed   By: Lowella Grip III M.D.   On: 08/22/2016 10:36    Note: Results of ordered imaging test(s) reviewed and explained to patient in Layman's terms. Copy of results provided to patient  Meds   Current Outpatient Prescriptions:  .  albuterol (PROAIR HFA) 108 (90 Base) MCG/ACT inhaler, INHALE 2 PUFFS BY MOUTH EVERY 4 HOURS ASNEEDED FOR WHEEZING, Disp: , Rfl:  .  aspirin EC 81 MG tablet, Take 81 mg by mouth., Disp: , Rfl:  .  atorvastatin  (LIPITOR) 40 MG tablet, Take 40 mg by mouth daily., Disp: , Rfl:  .  cetirizine (ZYRTEC) 10 MG tablet, Take 10 mg by mouth daily. , Disp: , Rfl:  .  Cholecalciferol (VITAMIN D3) 5000 units TABS, Take 1 tablet by mouth daily. , Disp: , Rfl:  .  clotrimazole (LOTRIMIN) 1 % cream, 1 application as needed. , Disp: , Rfl:  .  FLUoxetine (PROZAC) 20 MG capsule, Take 20 mg by mouth daily. , Disp: , Rfl:  .  gabapentin (NEURONTIN) 600 MG tablet, Take 1 tablet (600 mg total) by mouth every 8 (eight) hours., Disp: 270 tablet, Rfl: 0 .  glucose blood (ACCU-CHEK ACTIVE STRIPS) test strip, Checks 3 times daily, Disp: , Rfl:  .  hydrOXYzine (VISTARIL) 25 MG capsule, Take 50 mg by mouth every 6 (six) hours as needed. , Disp: , Rfl:  .  Ibuprofen-Diphenhydramine Cit (ADVIL PM PO), Take 1 tablet by mouth at bedtime., Disp: , Rfl:  .  insulin glargine (LANTUS) 100 UNIT/ML injection, Inject 60 Units into the skin at bedtime. , Disp: , Rfl:  .  insulin lispro (HUMALOG) 100 UNIT/ML injection, Inject 18 Units into the skin 3 (three) times daily with meals., Disp: , Rfl:  .  Insulin Pen Needle (UNIFINE PENTIPS) 31G X 6 MM MISC, , Disp: , Rfl:  .  Insulin Pen Needle (UNIFINE PENTIPS) 31G X 6 MM MISC, USE AS DIRECTED WITH LANTUS & NOVOLOG, Disp: , Rfl:  .  isosorbide mononitrate (IMDUR) 30 MG 24 hr tablet, 30 mg daily. , Disp: , Rfl:  .  lisinopril (PRINIVIL,ZESTRIL) 10 MG tablet, Take 10 mg by mouth daily. , Disp: , Rfl:  .  metFORMIN (GLUCOPHAGE) 500 MG tablet, Take 1,000 mg by mouth 2 (two) times daily with a meal., Disp: , Rfl:  .  nitroGLYCERIN (NITROSTAT) 0.4 MG SL tablet, Place 0.4 mg under the tongue every 5 (five) minutes as needed for chest pain., Disp: , Rfl:  .  nystatin (MYCOSTATIN/NYSTOP) powder, as needed. , Disp: , Rfl:  .  nystatin cream (MYCOSTATIN), , Disp: , Rfl:  .  oxyCODONE-acetaminophen (PERCOCET) 10-325 MG tablet, Take 1 tablet by mouth every 6 (six) hours as needed for pain., Disp: 120 tablet,  Rfl: 0 .  [START ON 08/01/2017] oxyCODONE-acetaminophen (PERCOCET) 10-325 MG tablet, Take 1 tablet by mouth every 6 (six) hours as needed for pain., Disp: 120 tablet, Rfl: 0 .  VICTOZA 18 MG/3ML SOPN, Inject into the skin 3 (three) times daily. , Disp: , Rfl:  .  albuterol (PROVENTIL) (5 MG/ML) 0.5% nebulizer solution, Take 2.5 mg by nebulization every 6 (six) hours as needed for wheezing or shortness of breath. , Disp: , Rfl:   ROS  Constitutional: Denies any fever or chills Gastrointestinal: No reported hemesis, hematochezia, vomiting, or acute GI distress Musculoskeletal: Denies any acute onset joint swelling, redness, loss of ROM, or weakness Neurological: No reported episodes of acute onset apraxia, aphasia, dysarthria, agnosia, amnesia, paralysis, loss of coordination, or loss of consciousness  Allergies  Douglas Andrews is allergic to sertraline.  Douglas Andrews  Drug: Douglas Andrews  reports that he does not use drugs. Alcohol:  reports that he does not drink alcohol. Tobacco:  reports that he has never smoked. He has never used smokeless tobacco. Medical:  has a past medical history of Chronic back pain; Diabetes mellitus without complication ([place]); Hyperlipidemia; Hypertension; Left leg pain (01/02/2014); Right leg pain (07/15/2015); and Sarcoidosis. Surgical: Douglas Andrews  has a past surgical history that includes Appendectomy; Tonsillectomy; and left rod. Family: family history includes Diabetes in his father; Heart disease in his father and mother; Stroke in his father.  Constitutional Exam  General appearance: Well nourished, well developed, and well hydrated. In no apparent acute distress Vitals:   06/07/17 1305  BP: (!) 137/101  Pulse: 86  Resp: 16  Temp: (!) 97.5 F (36.4 C)  SpO2: 98%  Weight: 160 lb (72.6 kg)  Height: '5\' 8"'$  (1.727 m)   BMI Assessment: Estimated body mass index is 24.33 kg/m as calculated from the following:   Height as of this encounter: '5\' 8"'$  (1.727 m).   Weight as  of this encounter: 160 lb (72.6 kg).  BMI interpretation table: BMI level Category Range association with higher incidence of chronic pain  <18 kg/m2 Underweight   18.5-24.9 kg/m2 Ideal body weight   25-29.9 kg/m2 Overweight Increased incidence by 20%  30-34.9 kg/m2 Obese (Class I) Increased incidence by 68%  35-39.9 kg/m2 Severe obesity (Class II) Increased incidence by 136%  >40 kg/m2 Extreme obesity (Class III) Increased incidence by 254%   BMI Readings from Last 4 Encounters:  06/07/17 24.33 kg/m  05/18/17 24.18 kg/m  03/22/17 25.24 kg/m  02/23/17 23.82 kg/m   Wt Readings from Last 4 Encounters:  06/07/17 160 lb (72.6 kg)  05/18/17 161 lb 6.4 oz (73.2 kg)  03/22/17 166 lb (75.3 kg)  02/23/17 159 lb (72.1 kg)  Psych/Mental status: Alert, oriented x 3 (person, place, & time)       Eyes: PERLA Respiratory: No evidence of acute respiratory distress  Cervical Spine Area Exam  Skin & Axial Inspection: No masses, redness, edema, swelling, or associated skin lesions Alignment: Symmetrical Functional ROM: Unrestricted ROM      Stability: No instability detected Muscle Tone/Strength: Functionally intact. No obvious neuro-muscular anomalies detected. Sensory (Neurological): Unimpaired Palpation: No palpable anomalies              Upper Extremity (UE) Exam    Side: Right upper extremity  Side: Left upper extremity  Skin & Extremity Inspection: Skin color, temperature, and hair growth are WNL. No peripheral edema or cyanosis. No masses, redness, swelling, asymmetry, or associated skin lesions. No contractures.  Skin & Extremity Inspection: Skin color, temperature, and hair growth are WNL. No peripheral edema or cyanosis. No masses, redness, swelling, asymmetry, or associated skin lesions. No contractures.  Functional ROM: Unrestricted ROM  Functional ROM: Unrestricted ROM          Muscle Tone/Strength: Functionally intact. No obvious neuro-muscular anomalies detected.   Muscle Tone/Strength: Functionally intact. No obvious neuro-muscular anomalies detected.  Sensory (Neurological): Unimpaired          Sensory (Neurological): Unimpaired          Palpation: No palpable anomalies              Palpation: No palpable anomalies              Specialized Test(s): Deferred         Specialized Test(s): Deferred          Thoracic Spine Area Exam  Skin & Axial Inspection: No masses, redness, or swelling Alignment: Symmetrical Functional ROM: Unrestricted ROM Stability: No instability detected Muscle Tone/Strength: Functionally intact. No obvious neuro-muscular anomalies detected. Sensory (Neurological): Unimpaired Muscle strength & Tone: No palpable anomalies  Lumbar Spine Area Exam  Skin & Axial Inspection: No masses, redness, or swelling Alignment: Symmetrical Functional ROM: Decreased ROM      Stability: No instability detected Muscle Tone/Strength: Functionally intact. No obvious neuro-muscular anomalies detected. Sensory (Neurological): Movement-associated pain Palpation: Complains of area being tender to palpation       Provocative Tests: Lumbar Hyperextension and rotation test: Positive bilaterally for facet joint pain. Lumbar Lateral bending test: evaluation deferred today       Patrick's Maneuver: Positive for bilateral S-I arthralgia              Gait & Posture Assessment  Ambulation: Patient ambulates using a cane Gait: Limited. Using assistive device to ambulate Posture: Antalgic   Lower Extremity Exam    Side: Right lower extremity  Side: Left lower extremity  Skin & Extremity Inspection: Skin color, temperature, and hair growth are WNL. No peripheral edema or cyanosis. No masses, redness, swelling, asymmetry, or associated skin lesions. No contractures.  Skin & Extremity Inspection: Skin color, temperature, and hair growth are WNL. No peripheral edema or cyanosis. No masses, redness, swelling, asymmetry, or associated skin lesions. No  contractures.  Functional ROM: Decreased ROM for hip and knee joints  Functional ROM: Decreased ROM for hip and knee joints  Muscle Tone/Strength: Deconditioned  Muscle Tone/Strength: Deconditioned  Sensory (Neurological): Dermatomal pain pattern (L2)   Sensory (Neurological): Dermatomal pain pattern (L5)   Palpation: No palpable anomalies  Palpation: No palpable anomalies   Assessment  Primary Diagnosis & Pertinent Problem List: The primary encounter diagnosis was Chronic low back pain (Primary Source of Pain) (Bilateral) (L>R). Diagnoses of Lumbar facet syndrome (Bilateral) (R>L), Chronic lower extremity pain (Secondary Area of Pain) (Bilateral) (L>R), Chronic lumbar radicular pain (Left) (L5 Dermatome), Chronic sacroiliac joint pain (Left), Osteoarthritis of lumbar spine,  Lumbar Spondylosis,  Lumbar DDD (degenerative disc disease), Retrolisthesis (2 mm) of L4 over L5, and Anterolisthesis (7 mm) were also pertinent to this visit.  Status Diagnosis  Reoccurring Persistent Worsening 1. Chronic low back pain (Primary Source of Pain) (Bilateral) (L>R)   2. Lumbar facet syndrome (Bilateral) (R>L)   3. Chronic lower extremity pain (Secondary Area of Pain) (Bilateral) (L>R)   4. Chronic lumbar radicular pain (Left) (L5 Dermatome)   5. Chronic sacroiliac joint pain (Left)   6. Osteoarthritis of lumbar spine   7.  Lumbar Spondylosis   8.  Lumbar DDD (degenerative disc disease)   9. Retrolisthesis (2 mm) of L4 over L5   10. Anterolisthesis (7 mm)     Problems updated and reviewed during this  visit: Problem  Chronic lower extremity pain (Secondary Area of Pain) (Bilateral) (L>R)  Retrolisthesis (2 mm) of L4 over L5  Anterolisthesis (7 mm) L5 over S1  Lumbar radicular pain (Left) (L5 Dermatome)   Plan of Care  Pharmacotherapy (Medications Ordered): No orders of the defined types were placed in this encounter.  New Prescriptions   No medications on file   Medications administered  today: Douglas Andrews had no medications administered during this visit.   Procedure Orders     LUMBAR FACET(MEDIAL BRANCH NERVE BLOCK) [facility] Lab Orders  No laboratory test(s) ordered today    Imaging Orders     DG Lumbar Spine Complete W/Bend     MR LUMBAR SPINE WO CONTRAST Referral Orders  No referral(s) requested today    Interventional management options: Planned, scheduled, and/or pending:   Diagnostic x-rays of the lumbar spine with flexion and extension Diagnostic MRI of the lumbar spine, secondary to recurrent, persistent, and progressive lumbar radiculitis Diagnostic bilateral Lumbar facet block #3, under fluoroscopic guidance and IV sedation    Considering:   Diagnostic bilateral Lumbar facet block Possible bilateral lumbar facet RFA  (left side first)  Diagnostic right intra-articular hip injection Possible radiofrequency of the right hipjoint Diagnostic Right L4-5 lumbar epidural steroid injection Diagnostic bilateral intra-articular knee injection Possible series of 5 Hyalgan knee injections.  Diagnostic bilateral Genicular nerve block Possible bilateral Genicular nerve radiofrequencyablation   Palliative PRN treatment(s):   Diagnostic bilateral Lumbar facet block #2 Diagnostic right intra-articular hip injection Diagnostic Right L4-5 lumbar epidural steroid injection Diagnostic bilateral intra-articular knee injection Possible series of 5 Hyalgan knee injections.  Diagnostic bilateral Genicular nerve block   Provider-requested follow-up: Return for Procedure (with sedation): (B) L-FCT BLK.  Future Appointments Date Time Provider [place]  06/22/2017 10:00 AM [facility]  06/22/2017 11:00 AM [facility]  08/28/2017 8:45 AM Vevelyn Francois, NP [facility] None   Primary Care Physician: Fritzi Mandes, MD Location: [facility] Note by: Gaspar Cola, MD Date: 06/07/2017; Time: 4:42 PM

## 2017-06-07 ENCOUNTER — Ambulatory Visit: Payer: Medicare Other | Attending: Pain Medicine | Admitting: Pain Medicine

## 2017-06-07 ENCOUNTER — Encounter: Payer: Self-pay | Admitting: Pain Medicine

## 2017-06-07 VITALS — BP 137/101 | HR 86 | Temp 97.5°F | Resp 16 | Ht 68.0 in | Wt 160.0 lb

## 2017-06-07 DIAGNOSIS — M533 Sacrococcygeal disorders, not elsewhere classified: Secondary | ICD-10-CM | POA: Diagnosis not present

## 2017-06-07 DIAGNOSIS — M79605 Pain in left leg: Secondary | ICD-10-CM

## 2017-06-07 DIAGNOSIS — M488X6 Other specified spondylopathies, lumbar region: Secondary | ICD-10-CM | POA: Diagnosis not present

## 2017-06-07 DIAGNOSIS — E785 Hyperlipidemia, unspecified: Secondary | ICD-10-CM | POA: Diagnosis not present

## 2017-06-07 DIAGNOSIS — R200 Anesthesia of skin: Secondary | ICD-10-CM | POA: Insufficient documentation

## 2017-06-07 DIAGNOSIS — E663 Overweight: Secondary | ICD-10-CM | POA: Insufficient documentation

## 2017-06-07 DIAGNOSIS — Z7982 Long term (current) use of aspirin: Secondary | ICD-10-CM | POA: Diagnosis not present

## 2017-06-07 DIAGNOSIS — M4316 Spondylolisthesis, lumbar region: Secondary | ICD-10-CM | POA: Diagnosis not present

## 2017-06-07 DIAGNOSIS — Z794 Long term (current) use of insulin: Secondary | ICD-10-CM | POA: Insufficient documentation

## 2017-06-07 DIAGNOSIS — F411 Generalized anxiety disorder: Secondary | ICD-10-CM | POA: Insufficient documentation

## 2017-06-07 DIAGNOSIS — M5116 Intervertebral disc disorders with radiculopathy, lumbar region: Secondary | ICD-10-CM | POA: Insufficient documentation

## 2017-06-07 DIAGNOSIS — M4310 Spondylolisthesis, site unspecified: Secondary | ICD-10-CM | POA: Diagnosis not present

## 2017-06-07 DIAGNOSIS — M25552 Pain in left hip: Secondary | ICD-10-CM | POA: Insufficient documentation

## 2017-06-07 DIAGNOSIS — M4726 Other spondylosis with radiculopathy, lumbar region: Secondary | ICD-10-CM | POA: Diagnosis not present

## 2017-06-07 DIAGNOSIS — E119 Type 2 diabetes mellitus without complications: Secondary | ICD-10-CM | POA: Insufficient documentation

## 2017-06-07 DIAGNOSIS — Z823 Family history of stroke: Secondary | ICD-10-CM | POA: Insufficient documentation

## 2017-06-07 DIAGNOSIS — M5136 Other intervertebral disc degeneration, lumbar region: Secondary | ICD-10-CM | POA: Diagnosis not present

## 2017-06-07 DIAGNOSIS — M170 Bilateral primary osteoarthritis of knee: Secondary | ICD-10-CM | POA: Insufficient documentation

## 2017-06-07 DIAGNOSIS — Z7952 Long term (current) use of systemic steroids: Secondary | ICD-10-CM | POA: Insufficient documentation

## 2017-06-07 DIAGNOSIS — M25551 Pain in right hip: Secondary | ICD-10-CM | POA: Insufficient documentation

## 2017-06-07 DIAGNOSIS — Z888 Allergy status to other drugs, medicaments and biological substances status: Secondary | ICD-10-CM | POA: Diagnosis not present

## 2017-06-07 DIAGNOSIS — Z833 Family history of diabetes mellitus: Secondary | ICD-10-CM | POA: Diagnosis not present

## 2017-06-07 DIAGNOSIS — G8929 Other chronic pain: Secondary | ICD-10-CM | POA: Diagnosis not present

## 2017-06-07 DIAGNOSIS — M4696 Unspecified inflammatory spondylopathy, lumbar region: Secondary | ICD-10-CM | POA: Diagnosis not present

## 2017-06-07 DIAGNOSIS — M545 Low back pain: Secondary | ICD-10-CM | POA: Diagnosis present

## 2017-06-07 DIAGNOSIS — M5441 Lumbago with sciatica, right side: Secondary | ICD-10-CM | POA: Diagnosis not present

## 2017-06-07 DIAGNOSIS — Z8249 Family history of ischemic heart disease and other diseases of the circulatory system: Secondary | ICD-10-CM | POA: Insufficient documentation

## 2017-06-07 DIAGNOSIS — D860 Sarcoidosis of lung: Secondary | ICD-10-CM | POA: Insufficient documentation

## 2017-06-07 DIAGNOSIS — E559 Vitamin D deficiency, unspecified: Secondary | ICD-10-CM | POA: Diagnosis not present

## 2017-06-07 DIAGNOSIS — G894 Chronic pain syndrome: Secondary | ICD-10-CM | POA: Insufficient documentation

## 2017-06-07 DIAGNOSIS — I10 Essential (primary) hypertension: Secondary | ICD-10-CM | POA: Insufficient documentation

## 2017-06-07 DIAGNOSIS — M47816 Spondylosis without myelopathy or radiculopathy, lumbar region: Secondary | ICD-10-CM

## 2017-06-07 DIAGNOSIS — Z79899 Other long term (current) drug therapy: Secondary | ICD-10-CM | POA: Diagnosis not present

## 2017-06-07 DIAGNOSIS — Z9181 History of falling: Secondary | ICD-10-CM | POA: Diagnosis not present

## 2017-06-07 DIAGNOSIS — R32 Unspecified urinary incontinence: Secondary | ICD-10-CM | POA: Insufficient documentation

## 2017-06-07 DIAGNOSIS — F329 Major depressive disorder, single episode, unspecified: Secondary | ICD-10-CM | POA: Insufficient documentation

## 2017-06-07 DIAGNOSIS — G4700 Insomnia, unspecified: Secondary | ICD-10-CM | POA: Diagnosis not present

## 2017-06-07 DIAGNOSIS — M79604 Pain in right leg: Secondary | ICD-10-CM | POA: Diagnosis not present

## 2017-06-07 DIAGNOSIS — Z6824 Body mass index (BMI) 24.0-24.9, adult: Secondary | ICD-10-CM | POA: Diagnosis not present

## 2017-06-07 DIAGNOSIS — Z79891 Long term (current) use of opiate analgesic: Secondary | ICD-10-CM | POA: Insufficient documentation

## 2017-06-07 DIAGNOSIS — M5416 Radiculopathy, lumbar region: Secondary | ICD-10-CM

## 2017-06-07 DIAGNOSIS — Z5181 Encounter for therapeutic drug level monitoring: Secondary | ICD-10-CM | POA: Insufficient documentation

## 2017-06-07 DIAGNOSIS — K7460 Unspecified cirrhosis of liver: Secondary | ICD-10-CM | POA: Insufficient documentation

## 2017-06-07 DIAGNOSIS — R1084 Generalized abdominal pain: Secondary | ICD-10-CM | POA: Insufficient documentation

## 2017-06-07 DIAGNOSIS — I8510 Secondary esophageal varices without bleeding: Secondary | ICD-10-CM | POA: Insufficient documentation

## 2017-06-07 NOTE — Patient Instructions (Addendum)
____________________________________________________________________________________________  Preparing for Procedure with Sedation Instructions: . Oral Intake: Do not eat or drink anything for at least 8 hours prior to your procedure. . Transportation: Public transportation is not allowed. Bring an adult driver. The driver must be physically present in our waiting room before any procedure can be started. . Physical Assistance: Bring an adult physically capable of assisting you, in the event you need help. This adult should keep you company at home for at least 6 hours after the procedure. . Blood Pressure Medicine: Take your blood pressure medicine with a sip of water the morning of the procedure. . Blood thinners:  . Diabetics on insulin: Notify the staff so that you can be scheduled 1st case in the morning. If your diabetes requires high dose insulin, take only  of your normal insulin dose the morning of the procedure and notify the staff that you have done so. . Preventing infections: Shower with an antibacterial soap the morning of your procedure. . Build-up your immune system: Take 1000 mg of Vitamin C with every meal (3 times a day) the day prior to your procedure. . Antibiotics: Inform the staff if you have a condition or reason that requires you to take antibiotics before dental procedures. . Pregnancy: If you are pregnant, call and cancel the procedure. . Sickness: If you have a cold, fever, or any active infections, call and cancel the procedure. . Arrival: You must be in the facility at least 30 minutes prior to your scheduled procedure. . Children: Do not bring children with you. . Dress appropriately: Bring dark clothing that you would not mind if they get stained. . Valuables: Do not bring any jewelry or valuables. Procedure appointments are reserved for interventional treatments only. . No Prescription Refills. . No medication changes will be discussed during procedure  appointments. . No disability issues will be discussed. ____________________________________________________________________________________________  Preparing for Procedure with Sedation Instructions: . Oral Intake: Do not eat or drink anything for at least 8 hours prior to your procedure. . Transportation: Public transportation is not allowed. Bring an adult driver. The driver must be physically present in our waiting room before any procedure can be started. . Physical Assistance: Bring an adult capable of physically assisting you, in the event you need help. . Blood Pressure Medicine: Take your blood pressure medicine with a sip of water the morning of the procedure. . Insulin: Take only  of your normal insulin dose. . Preventing infections: Shower with an antibacterial soap the morning of your procedure. . Build-up your immune system: Take 1000 mg of Vitamin C with every meal (3 times a day) the day prior to your procedure. . Pregnancy: If you are pregnant, call and cancel the procedure. . Sickness: If you have a cold, fever, or any active infections, call and cancel the procedure. . Arrival: You must be in the facility at least 30 minutes prior to your scheduled procedure. . Children: Do not bring children with you. . Dress appropriately: Bring dark clothing that you would not mind if they get stained. . Valuables: Do not bring any jewelry or valuables. Procedure appointments are reserved for interventional treatments only. . No Prescription Refills. . No medication changes will be discussed during procedure appointments. No disability issues will be discussed.Radiofrequency Lesioning Radiofrequency lesioning is a procedure that is performed to relieve pain. The procedure is often used for back, neck, or arm pain. Radiofrequency lesioning involves the use of a machine that creates radio waves to make heat.   During the procedure, the heat is applied to the nerve that carries the pain  signal. The heat damages the nerve and interferes with the pain signal. Pain relief usually starts about 2 weeks after the procedure and lasts for 6 months to 1 year. Tell a health care provider about:  Any allergies you have.  All medicines you are taking, including vitamins, herbs, eye drops, creams, and over-the-counter medicines.  Any problems you or family members have had with anesthetic medicines.  Any blood disorders you have.  Any surgeries you have had.  Any medical conditions you have.  Whether you are pregnant or may be pregnant. What are the risks? Generally, this is a safe procedure. However, problems may occur, including:  Pain or soreness at the injection site.  Infection at the injection site.  Damage to nerves or blood vessels.  What happens before the procedure?  Ask your health care provider about: ? Changing or stopping your regular medicines. This is especially important if you are taking diabetes medicines or blood thinners. ? Taking medicines such as aspirin and ibuprofen. These medicines can thin your blood. Do not take these medicines before your procedure if your health care provider instructs you not to.  Follow instructions from your health care provider about eating or drinking restrictions.  Plan to have someone take you home after the procedure.  If you go home right after the procedure, plan to have someone with you for 24 hours. What happens during the procedure?  You will be given one or more of the following: ? A medicine to help you relax (sedative). ? A medicine to numb the area (local anesthetic).  You will be awake during the procedure. You will need to be able to talk with the health care provider during the procedure.  With the help of a type of X-ray (fluoroscopy), the health care provider will insert a radiofrequency needle into the area to be treated.  Next, a wire that carries the radio waves (electrode) will be put through  the radiofrequency needle. An electrical pulse will be sent through the electrode to verify the correct nerve. You will feel a tingling sensation, and you may have muscle twitching.  Then, the tissue that is around the needle tip will be heated by an electric current that is passed using the radiofrequency machine. This will numb the nerves.  A bandage (dressing) will be put on the insertion area after the procedure is done. The procedure may vary among health care providers and hospitals. What happens after the procedure?  Your blood pressure, heart rate, breathing rate, and blood oxygen level will be monitored often until the medicines you were given have worn off.  Return to your normal activities as directed by your health care provider. This information is not intended to replace advice given to you by your health care provider. Make sure you discuss any questions you have with your health care provider. Document Released: 05/25/2011 Document Revised: 03/03/2016 Document Reviewed: 11/03/2014 Elsevier Interactive Patient Education  2018 [employer]  

## 2017-06-07 NOTE — Progress Notes (Signed)
Safety precautions to be maintained throughout the outpatient stay will include: orient to surroundings, keep bed in low position, maintain call bell within reach at all times, provide assistance with transfer out of bed and ambulation.  

## 2017-06-14 ENCOUNTER — Ambulatory Visit: Payer: Medicare Other

## 2017-06-22 ENCOUNTER — Ambulatory Visit
Admission: RE | Admit: 2017-06-22 | Discharge: 2017-06-22 | Disposition: A | Discharge status: Home / Self Care | Payer: Medicare Other | Source: Ambulatory Visit | Attending: Pain Medicine | Admitting: Pain Medicine

## 2017-06-22 DIAGNOSIS — M4726 Other spondylosis with radiculopathy, lumbar region: Secondary | ICD-10-CM

## 2017-06-22 DIAGNOSIS — I708 Atherosclerosis of other arteries: Secondary | ICD-10-CM | POA: Diagnosis not present

## 2017-06-22 DIAGNOSIS — M4316 Spondylolisthesis, lumbar region: Secondary | ICD-10-CM | POA: Diagnosis not present

## 2017-06-22 DIAGNOSIS — I700 Atherosclerosis of aorta: Secondary | ICD-10-CM | POA: Diagnosis not present

## 2017-06-22 DIAGNOSIS — M79604 Pain in right leg: Secondary | ICD-10-CM | POA: Insufficient documentation

## 2017-06-22 DIAGNOSIS — M4186 Other forms of scoliosis, lumbar region: Secondary | ICD-10-CM | POA: Diagnosis not present

## 2017-06-22 DIAGNOSIS — G8929 Other chronic pain: Secondary | ICD-10-CM | POA: Insufficient documentation

## 2017-06-22 DIAGNOSIS — M79605 Pain in left leg: Secondary | ICD-10-CM | POA: Insufficient documentation

## 2017-06-22 DIAGNOSIS — M5136 Other intervertebral disc degeneration, lumbar region: Secondary | ICD-10-CM | POA: Diagnosis not present

## 2017-06-22 DIAGNOSIS — M51369 Other intervertebral disc degeneration, lumbar region without mention of lumbar back pain or lower extremity pain: Secondary | ICD-10-CM

## 2017-06-22 DIAGNOSIS — M5441 Lumbago with sciatica, right side: Secondary | ICD-10-CM | POA: Insufficient documentation

## 2017-06-22 DIAGNOSIS — M4696 Unspecified inflammatory spondylopathy, lumbar region: Secondary | ICD-10-CM | POA: Insufficient documentation

## 2017-06-22 DIAGNOSIS — M5127 Other intervertebral disc displacement, lumbosacral region: Secondary | ICD-10-CM | POA: Diagnosis not present

## 2017-06-22 DIAGNOSIS — M47816 Spondylosis without myelopathy or radiculopathy, lumbar region: Secondary | ICD-10-CM

## 2017-06-22 DIAGNOSIS — M1288 Other specific arthropathies, not elsewhere classified, other specified site: Secondary | ICD-10-CM | POA: Diagnosis not present

## 2017-06-22 DIAGNOSIS — M5416 Radiculopathy, lumbar region: Secondary | ICD-10-CM

## 2017-06-29 ENCOUNTER — Ambulatory Visit
Admission: RE | Admit: 2017-06-29 | Discharge: 2017-06-29 | Disposition: A | Discharge status: Home / Self Care | Payer: Medicare Other | Source: Ambulatory Visit | Attending: Pain Medicine | Admitting: Pain Medicine

## 2017-06-29 ENCOUNTER — Ambulatory Visit (HOSPITAL_BASED_OUTPATIENT_CLINIC_OR_DEPARTMENT_OTHER): Payer: Medicare Other | Admitting: Pain Medicine

## 2017-06-29 ENCOUNTER — Encounter: Payer: Self-pay | Admitting: Pain Medicine

## 2017-06-29 VITALS — BP 135/82 | HR 70 | Temp 97.8°F | Resp 18 | Ht 68.5 in | Wt 158.0 lb

## 2017-06-29 DIAGNOSIS — Z794 Long term (current) use of insulin: Secondary | ICD-10-CM | POA: Insufficient documentation

## 2017-06-29 DIAGNOSIS — M4696 Unspecified inflammatory spondylopathy, lumbar region: Secondary | ICD-10-CM

## 2017-06-29 DIAGNOSIS — M4310 Spondylolisthesis, site unspecified: Secondary | ICD-10-CM | POA: Diagnosis not present

## 2017-06-29 DIAGNOSIS — Z888 Allergy status to other drugs, medicaments and biological substances status: Secondary | ICD-10-CM | POA: Diagnosis not present

## 2017-06-29 DIAGNOSIS — M47816 Spondylosis without myelopathy or radiculopathy, lumbar region: Secondary | ICD-10-CM

## 2017-06-29 DIAGNOSIS — M5136 Other intervertebral disc degeneration, lumbar region: Secondary | ICD-10-CM | POA: Diagnosis not present

## 2017-06-29 DIAGNOSIS — Z79891 Long term (current) use of opiate analgesic: Secondary | ICD-10-CM | POA: Insufficient documentation

## 2017-06-29 DIAGNOSIS — M4726 Other spondylosis with radiculopathy, lumbar region: Secondary | ICD-10-CM | POA: Insufficient documentation

## 2017-06-29 DIAGNOSIS — Z9889 Other specified postprocedural states: Secondary | ICD-10-CM | POA: Insufficient documentation

## 2017-06-29 DIAGNOSIS — Z7982 Long term (current) use of aspirin: Secondary | ICD-10-CM | POA: Insufficient documentation

## 2017-06-29 DIAGNOSIS — Z79899 Other long term (current) drug therapy: Secondary | ICD-10-CM | POA: Insufficient documentation

## 2017-06-29 DIAGNOSIS — M549 Dorsalgia, unspecified: Secondary | ICD-10-CM | POA: Insufficient documentation

## 2017-06-29 MED ORDER — LIDOCAINE HCL 2 % IJ SOLN
10.0000 mL | Freq: Once | INTRAMUSCULAR | Status: AC
  Administered 2017-06-29: 400 mg

## 2017-06-29 MED ORDER — TRIAMCINOLONE ACETONIDE 40 MG/ML IJ SUSP
40.0000 mg | Freq: Once | INTRAMUSCULAR | Status: AC
  Administered 2017-06-29: 40 mg
  Filled 2017-06-29: qty 1

## 2017-06-29 MED ORDER — MIDAZOLAM HCL 5 MG/5ML IJ SOLN
1.0000 mg | INTRAMUSCULAR | Status: DC | PRN
  Administered 2017-06-29: 2 mg via INTRAVENOUS
  Filled 2017-06-29: qty 5

## 2017-06-29 MED ORDER — ROPIVACAINE HCL 2 MG/ML IJ SOLN
9.0000 mL | Freq: Once | INTRAMUSCULAR | Status: AC
  Administered 2017-06-29: 9 mL via PERINEURAL
  Filled 2017-06-29: qty 10

## 2017-06-29 MED ORDER — FENTANYL CITRATE (PF) 100 MCG/2ML IJ SOLN
25.0000 ug | INTRAMUSCULAR | Status: DC | PRN
  Administered 2017-06-29: 100 ug via INTRAVENOUS
  Filled 2017-06-29: qty 2

## 2017-06-29 MED ORDER — LACTATED RINGERS IV SOLN
1000.0000 mL | Freq: Once | INTRAVENOUS | Status: AC
  Administered 2017-06-29: 1000 mL via INTRAVENOUS

## 2017-06-29 NOTE — Progress Notes (Signed)
Patient's Name: Douglas Andrews  MRN: [id number]  Referring Provider: Katharine Look, MD  DOB: [birth year]  PCP: Katharine Look, MD  DOS: 06/29/2017  Note by: Oswaldo Done, MD  Service setting: Ambulatory outpatient  Specialty: Interventional Pain Management  Patient type: Established  Location: [facility] (AMB) Pain Management Facility  Visit type: Interventional Procedure   Primary Reason for Visit: Interventional Pain Management Treatment. CC: Back Pain (lower back bilateral)  Procedure:  Anesthesia, Analgesia, Anxiolysis:  Type: Diagnostic Medial Branch Facet Block Region: Lumbar Level: L2, L3, L4, L5, & S1 Medial Branch Level(s) Laterality: Bilateral  Type: Local Anesthesia with Moderate (Conscious) Sedation Local Anesthetic: Lidocaine 1% Route: Intravenous (IV) IV Access: Secured Sedation: Meaningful verbal contact was maintained at all times during the procedure  Indication(s): Analgesia and Anxiety  Indications: 1. Lumbar facet syndrome (Bilateral) (R>L)   2.  Lumbar DDD (degenerative disc disease)   3.  Lumbar Spondylosis   4. Anterolisthesis (7 mm) L5 over S1    Pain Score: Pre-procedure: 4 /10 Post-procedure: 0-No pain/10  Pre-op Assessment:  Douglas Andrews is a 66 y.o. (year old), male patient, seen today for interventional treatment. He  has a past surgical history that includes Appendectomy; Tonsillectomy; and left rod. Douglas Andrews has a current medication list which includes the following prescription(s): albuterol, aspirin ec, atorvastatin, cetirizine, vitamin d3, clotrimazole, fluoxetine, gabapentin, glucose blood, hydroxyzine, ibuprofen-diphenhydramine cit, insulin glargine, insulin lispro, insulin pen needle, insulin pen needle, lisinopril, metformin, nitroglycerin, nystatin, nystatin cream, oxycodone-acetaminophen, oxycodone-acetaminophen, victoza, albuterol, and isosorbide mononitrate, and the following Facility-Administered Medications: fentanyl and midazolam. His  primarily concern today is the Back Pain (lower back bilateral)  Initial Vital Signs: There were no vitals taken for this visit. BMI: Estimated body mass index is 23.67 kg/m as calculated from the following:   Height as of this encounter: 5' 8.5" (1.74 m).   Weight as of this encounter: 158 lb (71.7 kg).  Risk Assessment: Allergies: Reviewed. He is allergic to sertraline.  Allergy Precautions: None required Coagulopathies: Reviewed. None identified.  Blood-thinner therapy: None at this time Active Infection(s): Reviewed. None identified. Douglas Andrews is afebrile  Site Confirmation: Douglas Andrews was asked to confirm the procedure and laterality before marking the site Procedure checklist: Completed Consent: Before the procedure and under the influence of no sedative(s), amnesic(s), or anxiolytics, the patient was informed of the treatment options, risks and possible complications. To fulfill our ethical and legal obligations, as recommended by the American Medical Association's Code of Ethics, I have informed the patient of my clinical impression; the nature and purpose of the treatment or procedure; the risks, benefits, and possible complications of the intervention; the alternatives, including doing nothing; the risk(s) and benefit(s) of the alternative treatment(s) or procedure(s); and the risk(s) and benefit(s) of doing nothing. The patient was provided information about the general risks and possible complications associated with the procedure. These may include, but are not limited to: failure to achieve desired goals, infection, bleeding, organ or nerve damage, allergic reactions, paralysis, and death. In addition, the patient was informed of those risks and complications associated to Spine-related procedures, such as failure to decrease pain; infection (i.e.: Meningitis, epidural or intraspinal abscess); bleeding (i.e.: epidural hematoma, subarachnoid hemorrhage, or any other type of  intraspinal or peri-dural bleeding); organ or nerve damage (i.e.: Any type of peripheral nerve, nerve root, or spinal cord injury) with subsequent damage to sensory, motor, and/or autonomic systems, resulting in permanent pain, numbness, and/or weakness of one or several areas of  the body; allergic reactions; (i.e.: anaphylactic reaction); and/or death. Furthermore, the patient was informed of those risks and complications associated with the medications. These include, but are not limited to: allergic reactions (i.e.: anaphylactic or anaphylactoid reaction(s)); adrenal axis suppression; blood sugar elevation that in diabetics may result in ketoacidosis or comma; water retention that in patients with history of congestive heart failure may result in shortness of breath, pulmonary edema, and decompensation with resultant heart failure; weight gain; swelling or edema; medication-induced neural toxicity; particulate matter embolism and blood vessel occlusion with resultant organ, and/or nervous system infarction; and/or aseptic necrosis of one or more joints. Finally, the patient was informed that Medicine is not an exact science; therefore, there is also the possibility of unforeseen or unpredictable risks and/or possible complications that may result in a catastrophic outcome. The patient indicated having understood very clearly. We have given the patient no guarantees and we have made no promises. Enough time was given to the patient to ask questions, all of which were answered to the patient's satisfaction. Douglas Andrews has indicated that he wanted to continue with the procedure. Attestation: I, the ordering provider, attest that I have discussed with the patient the benefits, risks, side-effects, alternatives, likelihood of achieving goals, and potential problems during recovery for the procedure that I have provided informed consent. Date: 06/29/2017; Time: 7:07 AM  Pre-Procedure Preparation:  Monitoring: As  per clinic protocol. Respiration, ETCO2, SpO2, BP, heart rate and rhythm monitor placed and checked for adequate function Safety Precautions: Patient was assessed for positional comfort and pressure points before starting the procedure. Time-out: I initiated and conducted the "Time-out" before starting the procedure, as per protocol. The patient was asked to participate by confirming the accuracy of the "Time Out" information. Verification of the correct person, site, and procedure were performed and confirmed by me, the nursing staff, and the patient. "Time-out" conducted as per Joint Commission's Universal Protocol (UP.01.01.01). "Time-out" Date & Time: 06/29/2017; 0951 hrs.  Description of Procedure Process:   Position: Prone Target Area: For Lumbar Facet blocks, the target is the groove formed by the junction of the transverse process and superior articular process. For the L5 dorsal ramus, the target is the notch between superior articular process and sacral ala. For the S1 dorsal ramus, the target is the superior and lateral edge of the posterior S1 Sacral foramen. Approach: Paramedial approach. Area Prepped: Entire Posterior Lumbosacral Region Prepping solution: ChloraPrep (2% chlorhexidine gluconate and 70% isopropyl alcohol) Safety Precautions: Aspiration looking for blood return was conducted prior to all injections. At no point did we inject any substances, as a needle was being advanced. No attempts were made at seeking any paresthesias. Safe injection practices and needle disposal techniques used. Medications properly checked for expiration dates. SDV (single dose vial) medications used. Description of the Procedure: Protocol guidelines were followed. The patient was placed in position over the fluoroscopy table. The target area was identified and the area prepped in the usual manner. Skin desensitized using vapocoolant spray. Skin & deeper tissues infiltrated with local anesthetic.  Appropriate amount of time allowed to pass for local anesthetics to take effect. The procedure needle was introduced through the skin, ipsilateral to the reported pain, and advanced to the target area. Employing the "Medial Branch Technique", the needles were advanced to the angle made by the superior and medial portion of the transverse process, and the lateral and inferior portion of the superior articulating process of the targeted vertebral bodies. This area is known as "  Burton's Eye" or the "Eye of the [place] Dog". A procedure needle was introduced through the skin, and this time advanced to the angle made by the superior and medial border of the sacral ala, and the lateral border of the S1 vertebral body. This last needle was later repositioned at the superior and lateral border of the posterior S1 foramen. Negative aspiration confirmed. Solution injected in intermittent fashion, asking for systemic symptoms every 0.5cc of injectate. The needles were then removed and the area cleansed, making sure to leave some of the prepping solution back to take advantage of its long term bactericidal properties.   Illustration of the posterior view of the lumbar spine and the posterior neural structures. Laminae of L2 through S1 are labeled. DPRL5, dorsal primary ramus of L5; DPRS1, dorsal primary ramus of S1; DPR3, dorsal primary ramus of L3; FJ, facet (zygapophyseal) joint L3-L4; I, inferior articular process of L4; LB1, lateral branch of dorsal primary ramus of L1; IAB, inferior articular branches from L3 medial branch (supplies L4-L5 facet joint); IBP, intermediate branch plexus; MB3, medial branch of dorsal primary ramus of L3; NR3, third lumbar nerve root; S, superior articular process of L5; SAB, superior articular branches from L4 (supplies L4-5 facet joint also); TP3, transverse process of L3.  Vitals:   06/29/17 1005 06/29/17 1015 06/29/17 1025 06/29/17 1035  BP: (!) 154/92 (!) 142/88 (!) 143/87 135/82    Pulse: 70     Resp: Temp:      TempSrc:      SpO2: 99% 95% 95% 98%  Weight:      Height:        Start Time: 0951 hrs. End Time: 1000 hrs. Materials:  Needle(s) Type: Regular needle Gauge: 22G Length: 3.5-in Medication(s): We administered lactated ringers, midazolam, fentaNYL, lidocaine, triamcinolone acetonide, ropivacaine (PF) 2 mg/mL (0.2%), triamcinolone acetonide, and ropivacaine (PF) 2 mg/mL (0.2%). Please see chart orders for dosing details.  Imaging Guidance (Spinal):  Type of Imaging Technique: Fluoroscopy Guidance (Spinal) Indication(s): Assistance in needle guidance and placement for procedures requiring needle placement in or near specific anatomical locations not easily accessible without such assistance. Exposure Time: Please see nurses notes. Contrast: None used. Fluoroscopic Guidance: I was personally present during the use of fluoroscopy. "Tunnel Vision Technique" used to obtain the best possible view of the target area. Parallax error corrected before commencing the procedure. "Direction-depth-direction" technique used to introduce the needle under continuous pulsed fluoroscopy. Once target was reached, antero-posterior, oblique, and lateral fluoroscopic projection used confirm needle placement in all planes. Images permanently stored in EMR. Interpretation: No contrast injected. I personally interpreted the imaging intraoperatively. Adequate needle placement confirmed in multiple planes. Permanent images saved into the patient's record.  Antibiotic Prophylaxis:  Indication(s): None identified Antibiotic given: None  Post-operative Assessment:  EBL: None Complications: No immediate post-treatment complications observed by team, or reported by patient. Note: The patient tolerated the entire procedure well. A repeat set of vitals were taken after the procedure and the patient was kept under observation following institutional policy, for this type of  procedure. Post-procedural neurological assessment was performed, showing return to baseline, prior to discharge. The patient was provided with post-procedure discharge instructions, including a section on how to identify potential problems. Should any problems arise concerning this procedure, the patient was given instructions to immediately contact us, at any time, without hesitation. In any case, we plan to contact the patient by telephone for a follow-up status report regarding this interventional procedure.  Comments:  No additional relevant information.  Plan of Care  Disposition: Discharge home  Discharge Date & Time: 06/29/2017; 1035 hrs.   Physician-requested Follow-up:  Return for post-procedure eval by Dr. Laban Emperor in 2 weeks.  Future Appointments Date Time Provider Department Center  07/17/2017 10:15 AM Delano Metz, MD [facility] None  08/28/2017 8:45 AM Barbette Merino, NP [facility] None    Imaging Orders     DG C-Arm 1-60 Min-No Report  Procedure Orders     LUMBAR FACET(MEDIAL BRANCH NERVE BLOCK) MBNB  Medications ordered for procedure: Meds ordered this encounter  Medications  . lactated ringers infusion 1,000 mL  . midazolam (VERSED) 5 MG/5ML injection 1-2 mg    Make sure Flumazenil is available in the pyxis when using this medication. If oversedation occurs, administer 0.2 mg IV over 15 sec. If after 45 sec no response, administer 0.2 mg again over 1 min; may repeat at 1 min intervals; not to exceed 4 doses (1 mg)  . fentaNYL (SUBLIMAZE) injection 25-50 mcg    Make sure Narcan is available in the pyxis when using this medication. In the event of respiratory depression (RR< 8/min): Titrate NARCAN (naloxone) in increments of 0.1 to 0.2 mg IV at 2-3 minute intervals, until desired degree of reversal.  . lidocaine (XYLOCAINE) 2 % (with pres) injection 200 mg  . triamcinolone acetonide (KENALOG-40) injection 40 mg  . ropivacaine (PF) 2 mg/mL (0.2%) (NAROPIN) injection 9  mL  . triamcinolone acetonide (KENALOG-40) injection 40 mg  . ropivacaine (PF) 2 mg/mL (0.2%) (NAROPIN) injection 9 mL   Medications administered: We administered lactated ringers, midazolam, fentaNYL, lidocaine, triamcinolone acetonide, ropivacaine (PF) 2 mg/mL (0.2%), triamcinolone acetonide, and ropivacaine (PF) 2 mg/mL (0.2%).  See the medical record for exact dosing, route, and time of administration.  New Prescriptions   No medications on file   Primary Care Physician: Foley, Shireen Quan, MD Location: [facility] Note by: Oswaldo Done, MD Date: 06/29/2017; Time: 10:51 AM  Disclaimer:  Medicine is not an exact science. The only guarantee in medicine is that nothing is guaranteed. It is important to note that the decision to proceed with this intervention was based on the information collected from the patient. The Data and conclusions were drawn from the patient's questionnaire, the interview, and the physical examination. Because the information was provided in large part by the patient, it cannot be guaranteed that it has not been purposely or unconsciously manipulated. Every effort has been made to obtain as much relevant data as possible for this evaluation. It is important to note that the conclusions that lead to this procedure are derived in large part from the available data. Always take into account that the treatment will also be dependent on availability of resources and existing treatment guidelines, considered by other Pain Management Practitioners as being common knowledge and practice, at the time of the intervention. For Medico-Legal purposes, it is also important to point out that variation in procedural techniques and pharmacological choices are the acceptable norm. The indications, contraindications, technique, and results of the above procedure should only be interpreted and judged by a Board-Certified Interventional Pain Specialist with  extensive familiarity and expertise in the same exact procedure and technique.

## 2017-06-29 NOTE — Patient Instructions (Addendum)
____________________________________________________________________________________________  Post-Procedure instructions Instructions:  Apply ice: Fill a plastic sandwich bag with crushed ice. Cover it with a small towel and apply to injection site. Apply for 15 minutes then remove x 15 minutes. Repeat sequence on day of procedure, until you go to bed. The purpose is to minimize swelling and discomfort after procedure.  Apply heat: Apply heat to procedure site starting the day following the procedure. The purpose is to treat any soreness and discomfort from the procedure.  Food intake: Start with clear liquids (like water) and advance to regular food, as tolerated.   Physical activities: Keep activities to a minimum for the first 8 hours after the procedure.   Driving: If you have received any sedation, you are not allowed to drive for 24 hours after your procedure.  Blood thinner: Restart your blood thinner 6 hours after your procedure. (Only for those taking blood thinners)  Insulin: As soon as you can eat, you may resume your normal dosing schedule. (Only for those taking insulin)  Infection prevention: Keep procedure site clean and dry.  Post-procedure Pain Diary: Extremely important that this be done correctly and accurately. Recorded information will be used to determine the next step in treatment.  Pain evaluated is that of treated area only. Do not include pain from an untreated area.  Complete every hour, on the hour, for the initial 8 hours. Set an alarm to help you do this part accurately.  Do not go to sleep and have it completed later. It will not be accurate.  Follow-up appointment: Keep your follow-up appointment after the procedure. Usually 2 weeks for most procedures. (6 weeks in the case of radiofrequency.) Bring you pain diary.  Expect:  From numbing medicine (AKA: Local Anesthetics): Numbness or decrease in pain.  Onset: Full effect within 15 minutes of  injected.  Duration: It will depend on the type of local anesthetic used. On the average, 1 to 8 hours.   From steroids: Decrease in swelling or inflammation. Once inflammation is improved, relief of the pain will follow.  Onset of benefits: Depends on the amount of swelling present. The more swelling, the longer it will take for the benefits to be seen. In some cases, up to 10 days.  Duration: Steroids will stay in the system x 2 weeks. Duration of benefits will depend on multiple posibilities including persistent irritating factors.  From procedure: Some discomfort is to be expected once the numbing medicine wears off. This should be minimal if ice and heat are applied as instructed. Call if:  You experience numbness and weakness that gets worse with time, as opposed to wearing off.  New onset bowel or bladder incontinence. (Spinal procedures only)  Emergency Numbers:  Durning business hours (Monday - Thursday, 8:00 AM - 4:00 PM) (Friday, 9:00 AM - 12:00 Noon): [phone]  After hours: [phone] ____________________________________________________________________________________________   Pain Management Discharge Instructions  General Discharge Instructions :  If you need to reach your doctor call: Monday-Friday 8:00 am - 4:00 pm at [phone] or toll free [phone].  After clinic hours [phone] to have operator reach doctor.  Bring all of your medication bottles to all your appointments in the pain clinic.  To cancel or reschedule your appointment with Pain Management please remember to call 24 hours in advance to avoid a fee.  Refer to the educational materials which you have been given on: General Risks, I had my Procedure. Discharge Instructions, Post Sedation.  Post Procedure Instructions:  The drugs   you were given will stay in your system until tomorrow, so for the next 24 hours you should not drive, make any legal decisions or drink any alcoholic  beverages.  You may eat anything you prefer, but it is better to start with liquids then soups and crackers, and gradually work up to solid foods.  Please notify your doctor immediately if you have any unusual bleeding, trouble breathing or pain that is not related to your normal pain.  Depending on the type of procedure that was done, some parts of your body may feel week and/or numb.  This usually clears up by tonight or the next day.  Walk with the use of an assistive device or accompanied by an adult for the 24 hours.  You may use ice on the affected area for the first 24 hours.  Put ice in a Ziploc bag and cover with a towel and place against area 15 minutes on 15 minutes off.  You may switch to heat after 24 hours. Facet Joint Block The facet joints connect the bones of the spine (vertebrae). They make it possible for you to bend, twist, and make other movements with your spine. They also keep you from bending too far, twisting too far, and making other excessive movements. A facet joint block is a procedure where a numbing medicine (anesthetic) is injected into a facet joint. Often, a type of anti-inflammatory medicine called a steroid is also injected. A facet joint block may be done to diagnose neck or back pain. If the pain gets better after a facet joint block, it means the pain is probably coming from the facet joint. If the pain does not get better, it means the pain is probably not coming from the facet joint. A facet joint block may also be done to relieve neck or back pain caused by an inflamed facet joint. A facet joint block is only done to relieve pain if the pain does not improve with other methods, such as medicine, exercise programs, and physical therapy. Tell a health care provider about:  Any allergies you have.  All medicines you are taking, including vitamins, herbs, eye drops, creams, and over-the-counter medicines.  Any problems you or family members have had with  anesthetic medicines.  Any blood disorders you have.  Any surgeries you have had.  Any medical conditions you have.  Whether you are pregnant or may be pregnant. What are the risks? Generally, this is a safe procedure. However, problems may occur, including:  Bleeding.  Injury to a nerve near the injection site.  Pain at the injection site.  Weakness or numbness in areas controlled by nerves near the injection site.  Infection.  Temporary fluid retention.  Allergic reactions to medicines or dyes.  Injury to other structures or organs near the injection site.  What happens before the procedure?  Follow instructions from your health care provider about eating or drinking restrictions.  Ask your health care provider about: ? Changing or stopping your regular medicines. This is especially important if you are taking diabetes medicines or blood thinners. ? Taking medicines such as aspirin and ibuprofen. These medicines can thin your blood. Do not take these medicines before your procedure if your health care provider instructs you not to.  Do not take any new dietary supplements or medicines without asking your health care provider first.  Plan to have someone take you home after the procedure. What happens during the procedure?  You may need to remove   your clothing and dress in an open-back gown.  The procedure will be done while you are lying on an X-ray table. You will most likely be asked to lie on your stomach, but you may be asked to lie in a different position if an injection will be made in your neck.  Machines will be used to monitor your oxygen levels, heart rate, and blood pressure.  If an injection will be made in your neck, an IV tube will be inserted into one of your veins. Fluids and medicine will flow directly into your body through the IV tube.  The area over the facet joint where the injection will be made will be cleaned with soap. The surrounding skin  will be covered with clean drapes.  A numbing medicine (local anesthetic) will be applied to your skin. Your skin may sting or burn for a moment.  A video X-ray machine (fluoroscopy) will be used to locate the joint. In some cases, a CT scan may be used.  A contrast dye may be injected into the facet joint area to help locate the joint.  When the joint is located, an anesthetic will be injected into the joint through the needle.  Your health care provider will ask you whether you feel pain relief. If you do feel relief, a steroid may be injected to provide pain relief for a longer period of time. If you do not feel relief or feel only partial relief, additional injections of an anesthetic may be made in other facet joints.  The needle will be removed.  Your skin will be cleaned.  A bandage (dressing) will be applied over each injection site. The procedure may vary among health care providers and hospitals. What happens after the procedure?  You will be observed for 15-30 minutes before being allowed to go home. This information is not intended to replace advice given to you by your health care provider. Make sure you discuss any questions you have with your health care provider. Document Released: 02/15/2007 Document Revised: 10/28/2015 Document Reviewed: 06/22/2015 [employer] Interactive Patient Education  2018 [employer]  

## 2017-06-30 ENCOUNTER — Telehealth: Payer: Self-pay

## 2017-06-30 NOTE — Telephone Encounter (Signed)
Post procedure phone call.  Patient states hes doing ok

## 2017-07-17 ENCOUNTER — Ambulatory Visit: Payer: Medicare Other | Attending: Pain Medicine | Admitting: Pain Medicine

## 2017-07-17 ENCOUNTER — Encounter: Payer: Self-pay | Admitting: Pain Medicine

## 2017-07-17 VITALS — BP 147/91 | HR 75 | Temp 97.8°F | Resp 18 | Ht 68.5 in | Wt 154.6 lb

## 2017-07-17 DIAGNOSIS — M25569 Pain in unspecified knee: Secondary | ICD-10-CM | POA: Insufficient documentation

## 2017-07-17 DIAGNOSIS — M4316 Spondylolisthesis, lumbar region: Secondary | ICD-10-CM | POA: Diagnosis not present

## 2017-07-17 DIAGNOSIS — F419 Anxiety disorder, unspecified: Secondary | ICD-10-CM | POA: Diagnosis not present

## 2017-07-17 DIAGNOSIS — E1165 Type 2 diabetes mellitus with hyperglycemia: Secondary | ICD-10-CM | POA: Insufficient documentation

## 2017-07-17 DIAGNOSIS — Z794 Long term (current) use of insulin: Secondary | ICD-10-CM | POA: Diagnosis not present

## 2017-07-17 DIAGNOSIS — I10 Essential (primary) hypertension: Secondary | ICD-10-CM | POA: Insufficient documentation

## 2017-07-17 DIAGNOSIS — Z8249 Family history of ischemic heart disease and other diseases of the circulatory system: Secondary | ICD-10-CM | POA: Insufficient documentation

## 2017-07-17 DIAGNOSIS — M549 Dorsalgia, unspecified: Secondary | ICD-10-CM | POA: Insufficient documentation

## 2017-07-17 DIAGNOSIS — M79604 Pain in right leg: Secondary | ICD-10-CM | POA: Insufficient documentation

## 2017-07-17 DIAGNOSIS — M47816 Spondylosis without myelopathy or radiculopathy, lumbar region: Secondary | ICD-10-CM | POA: Diagnosis not present

## 2017-07-17 DIAGNOSIS — R278 Other lack of coordination: Secondary | ICD-10-CM | POA: Insufficient documentation

## 2017-07-17 DIAGNOSIS — Z7982 Long term (current) use of aspirin: Secondary | ICD-10-CM | POA: Diagnosis not present

## 2017-07-17 DIAGNOSIS — M5441 Lumbago with sciatica, right side: Secondary | ICD-10-CM

## 2017-07-17 DIAGNOSIS — Z823 Family history of stroke: Secondary | ICD-10-CM | POA: Insufficient documentation

## 2017-07-17 DIAGNOSIS — M4726 Other spondylosis with radiculopathy, lumbar region: Secondary | ICD-10-CM | POA: Diagnosis not present

## 2017-07-17 DIAGNOSIS — F329 Major depressive disorder, single episode, unspecified: Secondary | ICD-10-CM | POA: Insufficient documentation

## 2017-07-17 DIAGNOSIS — M25561 Pain in right knee: Secondary | ICD-10-CM | POA: Diagnosis not present

## 2017-07-17 DIAGNOSIS — E559 Vitamin D deficiency, unspecified: Secondary | ICD-10-CM | POA: Insufficient documentation

## 2017-07-17 DIAGNOSIS — R079 Chest pain, unspecified: Secondary | ICD-10-CM | POA: Insufficient documentation

## 2017-07-17 DIAGNOSIS — M47896 Other spondylosis, lumbar region: Secondary | ICD-10-CM | POA: Insufficient documentation

## 2017-07-17 DIAGNOSIS — Z888 Allergy status to other drugs, medicaments and biological substances status: Secondary | ICD-10-CM | POA: Insufficient documentation

## 2017-07-17 DIAGNOSIS — Z833 Family history of diabetes mellitus: Secondary | ICD-10-CM | POA: Insufficient documentation

## 2017-07-17 DIAGNOSIS — M25562 Pain in left knee: Secondary | ICD-10-CM | POA: Diagnosis not present

## 2017-07-17 DIAGNOSIS — Z7952 Long term (current) use of systemic steroids: Secondary | ICD-10-CM | POA: Diagnosis not present

## 2017-07-17 DIAGNOSIS — M5136 Other intervertebral disc degeneration, lumbar region: Secondary | ICD-10-CM | POA: Diagnosis not present

## 2017-07-17 DIAGNOSIS — G8929 Other chronic pain: Secondary | ICD-10-CM

## 2017-07-17 DIAGNOSIS — E785 Hyperlipidemia, unspecified: Secondary | ICD-10-CM | POA: Insufficient documentation

## 2017-07-17 DIAGNOSIS — M79605 Pain in left leg: Secondary | ICD-10-CM

## 2017-07-17 DIAGNOSIS — F1120 Opioid dependence, uncomplicated: Secondary | ICD-10-CM | POA: Insufficient documentation

## 2017-07-17 DIAGNOSIS — K5900 Constipation, unspecified: Secondary | ICD-10-CM | POA: Insufficient documentation

## 2017-07-17 DIAGNOSIS — D869 Sarcoidosis, unspecified: Secondary | ICD-10-CM | POA: Diagnosis not present

## 2017-07-17 DIAGNOSIS — G894 Chronic pain syndrome: Secondary | ICD-10-CM | POA: Insufficient documentation

## 2017-07-17 DIAGNOSIS — Z79899 Other long term (current) drug therapy: Secondary | ICD-10-CM | POA: Insufficient documentation

## 2017-07-17 DIAGNOSIS — I708 Atherosclerosis of other arteries: Secondary | ICD-10-CM | POA: Diagnosis not present

## 2017-07-17 DIAGNOSIS — I700 Atherosclerosis of aorta: Secondary | ICD-10-CM | POA: Diagnosis not present

## 2017-07-17 DIAGNOSIS — G510 Bell's palsy: Secondary | ICD-10-CM | POA: Diagnosis not present

## 2017-07-17 DIAGNOSIS — G4700 Insomnia, unspecified: Secondary | ICD-10-CM | POA: Insufficient documentation

## 2017-07-17 NOTE — Progress Notes (Signed)
Patient's Name: Douglas Andrews  MRN: [id number]  Referring Provider: Fritzi Mandes, MD  DOB: [birth year]  PCP: Fritzi Mandes, MD  DOS: 07/17/2017  Note by: Gaspar Cola, MD  Service setting: Ambulatory outpatient  Specialty: Interventional Pain Management  Location: [facility] (AMB) Pain Management Facility    Patient type: Established   Primary Reason(s) for Visit: Encounter for post-procedure evaluation of chronic illness with mild to moderate exacerbation CC: Back Pain (low and equal bilaterally)  HPI  [name] is a 66 y.o. year old, male patient, who comes today for a post-procedure evaluation. He has Chronic low back pain (Primary Source of Pain) (Bilateral) (L>R); Chronic pain syndrome; Lumbar DDD (degenerative disc disease); Lumbar Spondylosis; Lumbar radicular pain (Left) (L5 Dermatome); Lumbar facet syndrome (Bilateral) (R>L); Opiate use (60 MME/Day); Uncomplicated opioid dependence ([place]); Long term prescription opiate use; Chronic knee pain (Tertiary source of pain) (Bilateral) (Right); Vitamin D insufficiency; Overweight; Bell palsy; CN (constipation); Dermatitis, eczematoid; Diabetes mellitus ([place]); Dysmetria; Anxiety, generalized; Insomnia due to medical condition; Long term current use of systemic steroids; Type 2 diabetes mellitus ([place]); Pulmonary sarcoidosis ([place]); Arthropathy, traumatic, shoulder; Bursitis, trochanteric; Esophageal varices ([place]); Leg varices; Hepatic cirrhosis ([place]); Major depressive disorder with single episode; Other long term (current) drug therapy; At risk for falling; Encounter for chronic pain management; Encounter for therapeutic drug level monitoring; Chronic lumbar radicular pain (Left) (L5 Dermatome); Lumbar Levoscoliosis; Neurogenic pain; Osteoarthritis of knee (Right); At high risk for falls; Chest pain; Skin lesion; Pain of left femur (left femoral rod due to sarcoidosis); Chronic hip pain (Right); Osteoarthritis of knee (Bilateral) (R>L); Essential  hypertension; Folliculitis; Chronic sacroiliac joint pain (Left); Chronic hip pain (Left); Chronic thoracic back pain (Left); Diarrhea; Numbness on left side; Gastroenteritis; Other fatigue; Urinary incontinence without sensory awareness; Balanitis; Generalized abdominal pain; Hyperglycemic crisis in diabetes mellitus ([place]); Osteoarthritis of lumbar spine; Chronic lower extremity pain (Secondary Area of Pain) (Bilateral) (L>R); Retrolisthesis (2 mm) of L4 over L5; and Anterolisthesis (7 mm) L5 over S1 on his problem list. His primarily concern today is the Back Pain (low and equal bilaterally)  Pain Assessment: Location: Lower, Left, Right Back Radiating: denies Onset: More than a month ago Duration: Chronic pain Quality: Aching, Constant Severity: 3 /10 (self-reported pain score)  Note: Reported level is compatible with observation.                         Timing: Constant Modifying factors: medications, procedures ice heat  [name] comes in today for post-procedure evaluation after the treatment done on 06/29/2017. The patient recently had: diagnostic x-rays of the lumbar spine with flexion and extension, diagnostic MRI of the lumbar spine, secondary to recurrent, persistent, and progressive lumbar radiculitis, diagnostic bilateral Lumbar facet block #3, under fluoroscopic guidance and IV sedation. The patient is doing rather well after his third diagnostic bilateral lumbar facet block. At this point, if the pain returns again, we will proceed with lumbar facet RFA. He indicates the right side to be the worst and therefore we will start with the right. With all 3 diagnostic lumbar facet blocks, he has attained 100% relief of the pain for the duration of the local anesthetic with prolonged relief past the duration of that local anesthetic suggesting that there is also an inflammatory component to the pain.  Further details on both, my assessment(s), as well as the proposed treatment plan, please  see below.  Post-Procedure Assessment  06/29/2017 Procedure: Diagnostic bilateral lumbar facet  block under fluoroscopic guidance and IV sedation Pre-procedure pain score:  4/10 Post-procedure pain score: 0/10 (100% relief) Influential Factors: BMI: 23.16 kg/m Intra-procedural challenges: None observed.         Assessment challenges: None detected.              Reported side-effects: None.        Post-procedural adverse reactions or complications: None reported         Sedation: Sedation provided. When no sedatives are used, the analgesic levels obtained are directly associated to the effectiveness of the local anesthetics. However, when sedation is provided, the level of analgesia obtained during the initial 1 hour following the intervention, is believed to be the result of a combination of factors. These factors may include, but are not limited to: 1. The effectiveness of the local anesthetics used. 2. The effects of the analgesic(s) and/or anxiolytic(s) used. 3. The degree of discomfort experienced by the patient at the time of the procedure. 4. The patients ability and reliability in recalling and recording the events. 5. The presence and influence of possible secondary gains and/or psychosocial factors. Reported result: Relief experienced during the 1st hour after the procedure: 100 % (Ultra-Short Term Relief) [name] has indicated area to have been numb during this time. Interpretative annotation: Clinically appropriate result. Analgesia during this period is likely to be Local Anesthetic and/or IV Sedative (Analgesic/Anxiolytic) related.          Effects of local anesthetic: The analgesic effects attained during this period are directly associated to the localized infiltration of local anesthetics and therefore cary significant diagnostic value as to the etiological location, or anatomical origin, of the pain. Expected duration of relief is directly dependent on the pharmacodynamics of  the local anesthetic used. Long-acting (4-6 hours) anesthetics used.  Reported result: Relief during the next 4 to 6 hour after the procedure: 100 % (Short-Term Relief) Mr. Simkin has indicated area to have been numb during this time. Interpretative annotation: Clinically appropriate result. Analgesia during this period is likely to be Local Anesthetic-related.          Long-term benefit: Defined as the period of time past the expected duration of local anesthetics (1 hour for short-acting and 4-6 hours for long-acting). With the possible exception of prolonged sympathetic blockade from the local anesthetics, benefits during this period are typically attributed to, or associated with, other factors such as analgesic sensory neuropraxia, antiinflammatory effects, or beneficial biochemical changes provided by agents other than the local anesthetics.  Reported result: Extended relief following procedure: 100 % (Long-Term Relief) Mr. Mahabir reports the axial pain improved more than the extremity pain. Interpretative annotation: Clinically appropriate result. Good relief. No permanent benefit expected. Inflammation plays a part in the etiology to the pain. Benefit believed to be steroid-related.  Current benefits: Defined as reported results that persistent at this point in time.   Analgesia: 80 % Mr. Gehring reports improvement of axial symptoms. Function: Mr. Tritz reports improvement in function ROM: Mr. Capelli reports improvement in ROM Interpretative annotation: Recurrence of symptoms. No permanent benefit expected. Effective palliative intervention. Benefit could be steroid-related.  Interpretation: Results would suggest Mr. Colao to be a good candidate for Radiofrequency Ablation. The patient has failed to respond to conservative therapies including over-the-counter medications, anti-inflammatories, muscle relaxants, membrane stabilizers, opioids, physical therapy modalities such as heat and ice, as  well as more invasive techniques such as nerve blocks. Because Mr. Ayo did attain more than 50% relief of the pain  during a series of diagnostic blocks conducted in separate occasions, I believe it is medically necessary to proceed with Radiofrequency Ablation, in order to attempt gaining longer relief. Mr. Amory indicates having had an unsuccessful trial of physical therapy, which he described as non-beneficial and painful.  Plan:  Set up procedure as a PRN palliative treatment option for this patient.       Laboratory Chemistry  Inflammation Markers (CRP: Acute Phase) (ESR: Chronic Phase) Lab Results  Component Value Date   CRP <0.5 03/30/2016   ESRSEDRATE 1 03/30/2016                 Renal Function Markers Lab Results  Component Value Date   BUN 12 03/30/2016   CREATININE 0.85 03/30/2016   GFRAA >60 03/30/2016   GFRNONAA >60 03/30/2016                 Hepatic Function Markers Lab Results  Component Value Date   AST 24 03/30/2016   ALT 27 03/30/2016   ALBUMIN 4.6 03/30/2016   ALKPHOS 63 03/30/2016                 Electrolytes Lab Results  Component Value Date   NA 136 03/30/2016   K 3.8 03/30/2016   CL 100 (L) 03/30/2016   CALCIUM 9.2 03/30/2016   MG 2.2 03/30/2016                 Neuropathy Markers Lab Results  Component Value Date   FUXNATFT73 220 03/30/2016                 Bone Pathology Markers Lab Results  Component Value Date   ALKPHOS 63 03/30/2016   25OHVITD1 38 03/30/2016   25OHVITD2 3.4 03/30/2016   25OHVITD3 35 03/30/2016   CALCIUM 9.2 03/30/2016                 Coagulation Parameters No results found for: INR, LABPROT, APTT, PLT               Cardiovascular Markers No results found for: BNP, HGB, HCT               Note: Lab results reviewed.  Imaging Review  Lumbosacral Imaging: Lumbar MR wo contrast:  Results for orders placed during the hospital encounter of 06/22/17  MR LUMBAR SPINE WO CONTRAST   Narrative CLINICAL DATA:   Chronic low back pain  EXAM: MRI LUMBAR SPINE WITHOUT CONTRAST  TECHNIQUE: Multiplanar, multisequence MR imaging of the lumbar spine was performed. No intravenous contrast was administered.  COMPARISON:  None.  FINDINGS: Segmentation:  Standard.  Alignment:  Physiologic.  Vertebrae:  No fracture, evidence of discitis, or bone lesion.  Conus medullaris: Extends to the L1 level and appears normal.  Paraspinal and other soft tissues: No paraspinal abnormality.  Disc levels:  Disc spaces: Disc desiccation at L3-4, L4-5 and L5-S1. Disc height loss at L5-S1.  T12-L1: No significant disc bulge. No evidence of neural foraminal stenosis. No central canal stenosis.  L1-L2: No significant disc bulge. No evidence of neural foraminal stenosis. No central canal stenosis.  L2-L3: No significant disc bulge. No evidence of neural foraminal stenosis. No central canal stenosis.  L3-L4: Broad-based disc bulge. No evidence of neural foraminal stenosis. No central canal stenosis.  L4-L5: Mild broad-based disc bulge. Mild bilateral facet arthropathy. No evidence of neural foraminal stenosis. No central canal stenosis.  L5-S1: Mild broad-based disc bulge so tiny central disc protrusions. Mild bilateral facet  arthropathy. No evidence of neural foraminal stenosis. No central canal stenosis.  IMPRESSION: 1. At L5-S1 there is a mild broad-based disc bulge so tiny central disc protrusions. Mild bilateral facet arthropathy. 2. At L4-5 there is a mild broad-based disc bulge. Mild bilateral facet arthropathy.   Electronically Signed   By: Kathreen Devoid   On: 06/22/2017 11:14    Lumbar DG (Complete) 4+V:  Results for orders placed during the hospital encounter of 08/22/16  DG Lumbar Spine Complete   Narrative CLINICAL DATA:  Lumbago with several recent falls  EXAM: LUMBAR SPINE - COMPLETE 4+ VIEW  COMPARISON:  None.  FINDINGS: Frontal, lateral, spot lumbosacral lateral, and  bilateral oblique views were obtained. There are 5 non-rib-bearing lumbar type vertebral bodies. T12 ribs are hypoplastic. There is mild lumbar levoscoliosis. There is no fracture. There is 2 mm of retrolisthesis of L4 on L5. There is 7 mm of anterolisthesis of L5 on S1. There is no other evident spondylolisthesis. There is an unfused apophysis along the anterior superior aspect of the L3 vertebral body. Pars defects are noted at L5 bilaterally. There is mild disc space narrowing at L4-5 and L5-S1. There is facet osteoarthritic change at L4-5 and L5-S1 bilaterally.  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 No fracture. Osteoarthritic change at L4-5 and L5-S1.   Electronically Signed   By: Lowella Grip III M.D.   On: 08/22/2016 10:33    Lumbar DG Bending views:  Results for orders placed during the hospital encounter of 06/22/17  DG Lumbar Spine Complete W/Bend   Narrative CLINICAL DATA:  Back pain.  Pain radiates down legs.  EXAM: LUMBAR SPINE - COMPLETE WITH BENDING VIEWS  COMPARISON:  08/22/2016.  FINDINGS: Lumbar spine scoliosis concave left. No acute bony abnormality identified. No flexion or extension deformity noted. Stable mild grade 1 spondylolisthesis L5-S1. Aortoiliac atherosclerotic vascular calcification .  IMPRESSION: 1. Stable mild grade 1 spondylolisthesis L5-S1. No flexion or extension deformity noted.  2. Diffuse degenerative change with mild scoliosis concave right. No acute bony abnormality identified.  3. Aortoiliac atherosclerotic vascular disease.   Electronically Signed   By: Marcello Moores  Register   On: 06/22/2017 09:53    Note: Results of ordered imaging test(s) reviewed and explained to patient in Layman's terms.        Meds   Current Outpatient Prescriptions:  .  albuterol (PROAIR HFA) 108 (90 Base) MCG/ACT inhaler, INHALE 2 PUFFS BY MOUTH EVERY 4 HOURS ASNEEDED FOR WHEEZING, Disp: , Rfl:   .  aspirin EC 81 MG tablet, Take 81 mg by mouth., Disp: , Rfl:  .  atorvastatin (LIPITOR) 40 MG tablet, Take 40 mg by mouth daily., Disp: , Rfl:  .  cetirizine (ZYRTEC) 10 MG tablet, Take 10 mg by mouth daily. , Disp: , Rfl:  .  Cholecalciferol (VITAMIN D3) 5000 units TABS, Take 1 tablet by mouth daily. , Disp: , Rfl:  .  clotrimazole (LOTRIMIN) 1 % cream, 1 application as needed. , Disp: , Rfl:  .  FLUoxetine (PROZAC) 20 MG capsule, Take 20 mg by mouth daily. , Disp: , Rfl:  .  gabapentin (NEURONTIN) 600 MG tablet, Take 1 tablet (600 mg total) by mouth every 8 (eight) hours., Disp: 270 tablet, Rfl: 0 .  glucose blood (ACCU-CHEK ACTIVE STRIPS) test strip, Checks 3 times daily, Disp: , Rfl:  .  hydrOXYzine (VISTARIL) 25 MG capsule, Take 50 mg by mouth every 6 (six) hours as needed. , Disp: ,  Rfl:  .  Ibuprofen-Diphenhydramine Cit (ADVIL PM PO), Take 1 tablet by mouth at bedtime., Disp: , Rfl:  .  insulin glargine (LANTUS) 100 UNIT/ML injection, Inject 60 Units into the skin at bedtime. , Disp: , Rfl:  .  insulin lispro (HUMALOG) 100 UNIT/ML injection, Inject 18 Units into the skin 3 (three) times daily with meals., Disp: , Rfl:  .  Insulin Pen Needle (UNIFINE PENTIPS) 31G X 6 MM MISC, , Disp: , Rfl:  .  Insulin Pen Needle (UNIFINE PENTIPS) 31G X 6 MM MISC, USE AS DIRECTED WITH LANTUS & NOVOLOG, Disp: , Rfl:  .  isosorbide mononitrate (IMDUR) 30 MG 24 hr tablet, 30 mg daily. , Disp: , Rfl:  .  lisinopril (PRINIVIL,ZESTRIL) 10 MG tablet, Take 10 mg by mouth daily. , Disp: , Rfl:  .  metFORMIN (GLUCOPHAGE) 500 MG tablet, Take 1,000 mg by mouth 2 (two) times daily with a meal., Disp: , Rfl:  .  nitroGLYCERIN (NITROSTAT) 0.4 MG SL tablet, Place 0.4 mg under the tongue every 5 (five) minutes as needed for chest pain., Disp: , Rfl:  .  nystatin (MYCOSTATIN/NYSTOP) powder, as needed. , Disp: , Rfl:  .  nystatin cream (MYCOSTATIN), , Disp: , Rfl:  .  [START ON 08/01/2017] oxyCODONE-acetaminophen  (PERCOCET) 10-325 MG tablet, Take 1 tablet by mouth every 6 (six) hours as needed for pain., Disp: 120 tablet, Rfl: 0 .  VICTOZA 18 MG/3ML SOPN, Inject into the skin 3 (three) times daily. , Disp: , Rfl:  .  albuterol (PROVENTIL) (5 MG/ML) 0.5% nebulizer solution, Take 2.5 mg by nebulization every 6 (six) hours as needed for wheezing or shortness of breath. , Disp: , Rfl:  .  oxyCODONE-acetaminophen (PERCOCET) 10-325 MG tablet, Take 1 tablet by mouth every 6 (six) hours as needed for pain., Disp: 120 tablet, Rfl: 0  ROS  Constitutional: Denies any fever or chills Gastrointestinal: No reported hemesis, hematochezia, vomiting, or acute GI distress Musculoskeletal: Denies any acute onset joint swelling, redness, loss of ROM, or weakness Neurological: No reported episodes of acute onset apraxia, aphasia, dysarthria, agnosia, amnesia, paralysis, loss of coordination, or loss of consciousness  Allergies  Mr. Kamphaus is allergic to sertraline.  [place]  Drug: Mr. Makara  reports that he does not use drugs. Alcohol:  reports that he does not drink alcohol. Tobacco:  reports that he has never smoked. He has never used smokeless tobacco. Medical:  has a past medical history of Chronic back pain; Diabetes mellitus without complication ([place]); Hyperlipidemia; Hypertension; Left leg pain (01/02/2014); Right leg pain (07/15/2015); and Sarcoidosis. Surgical: Mr. Yale  has a past surgical history that includes Appendectomy; Tonsillectomy; and left rod. Family: family history includes Diabetes in his father; Heart disease in his father and mother; Stroke in his father.  Constitutional Exam  General appearance: Well nourished, well developed, and well hydrated. In no apparent acute distress Vitals:   07/17/17 1029  BP: (!) 147/91  Pulse: 75  Resp: 18  Temp: 97.8 F (36.6 C)  TempSrc: Oral  SpO2: 100%  Weight: 154 lb 9.6 oz (70.1 kg)  Height: 5' 8.5" (1.74 m)   BMI Assessment: Estimated body mass index  is 23.16 kg/m as calculated from the following:   Height as of this encounter: 5' 8.5" (1.74 m).   Weight as of this encounter: 154 lb 9.6 oz (70.1 kg).  BMI interpretation table: BMI level Category Range association with higher incidence of chronic pain  <18 kg/m2 Underweight   18.5-24.9  kg/m2 Ideal body weight   25-29.9 kg/m2 Overweight Increased incidence by 20%  30-34.9 kg/m2 Obese (Class I) Increased incidence by 68%  35-39.9 kg/m2 Severe obesity (Class II) Increased incidence by 136%  >40 kg/m2 Extreme obesity (Class III) Increased incidence by 254%   BMI Readings from Last 4 Encounters:  07/17/17 23.16 kg/m  06/29/17 23.67 kg/m  06/07/17 24.33 kg/m  05/18/17 24.18 kg/m   Wt Readings from Last 4 Encounters:  07/17/17 154 lb 9.6 oz (70.1 kg)  06/29/17 158 lb (71.7 kg)  06/07/17 160 lb (72.6 kg)  05/18/17 161 lb 6.4 oz (73.2 kg)  Psych/Mental status: Alert, oriented x 3 (person, place, & time)       Eyes: PERLA Respiratory: No evidence of acute respiratory distress  Cervical Spine Area Exam  Skin & Axial Inspection: No masses, redness, edema, swelling, or associated skin lesions Alignment: Symmetrical Functional ROM: Unrestricted ROM      Stability: No instability detected Muscle Tone/Strength: Functionally intact. No obvious neuro-muscular anomalies detected. Sensory (Neurological): Unimpaired Palpation: No palpable anomalies              Upper Extremity (UE) Exam    Side: Right upper extremity  Side: Left upper extremity  Skin & Extremity Inspection: Skin color, temperature, and hair growth are WNL. No peripheral edema or cyanosis. No masses, redness, swelling, asymmetry, or associated skin lesions. No contractures.  Skin & Extremity Inspection: Skin color, temperature, and hair growth are WNL. No peripheral edema or cyanosis. No masses, redness, swelling, asymmetry, or associated skin lesions. No contractures.  Functional ROM: Unrestricted ROM          Functional  ROM: Unrestricted ROM          Muscle Tone/Strength: Functionally intact. No obvious neuro-muscular anomalies detected.  Muscle Tone/Strength: Functionally intact. No obvious neuro-muscular anomalies detected.  Sensory (Neurological): Unimpaired          Sensory (Neurological): Unimpaired          Palpation: No palpable anomalies              Palpation: No palpable anomalies              Specialized Test(s): Deferred         Specialized Test(s): Deferred          Thoracic Spine Area Exam  Skin & Axial Inspection: No masses, redness, or swelling Alignment: Symmetrical Functional ROM: Unrestricted ROM Stability: No instability detected Muscle Tone/Strength: Functionally intact. No obvious neuro-muscular anomalies detected. Sensory (Neurological): Unimpaired Muscle strength & Tone: No palpable anomalies  Lumbar Spine Area Exam  Skin & Axial Inspection: No masses, redness, or swelling Alignment: Symmetrical Functional ROM: Improved after treatment      Stability: No instability detected Muscle Tone/Strength: Functionally intact. No obvious neuro-muscular anomalies detected. Sensory (Neurological): Movement-associated discomfort Palpation: Complains of area being tender to palpation       Provocative Tests: Lumbar Hyperextension and rotation test: Positive bilaterally for facet joint pain. Lumbar Lateral bending test: evaluation deferred today       Patrick's Maneuver: evaluation deferred today                    Gait & Posture Assessment  Ambulation: Unassisted Gait: Improved after treatment Posture: Antalgic   Lower Extremity Exam    Side: Right lower extremity  Side: Left lower extremity  Skin & Extremity Inspection: Skin color, temperature, and hair growth are WNL. No peripheral edema or cyanosis. No masses, redness, swelling,  asymmetry, or associated skin lesions. No contractures.  Skin & Extremity Inspection: Skin color, temperature, and hair growth are WNL. No peripheral edema or  cyanosis. No masses, redness, swelling, asymmetry, or associated skin lesions. No contractures.  Functional ROM: Unrestricted ROM          Functional ROM: Unrestricted ROM          Muscle Tone/Strength: Functionally intact. No obvious neuro-muscular anomalies detected.  Muscle Tone/Strength: Functionally intact. No obvious neuro-muscular anomalies detected.  Sensory (Neurological): Unimpaired  Sensory (Neurological): Unimpaired  Palpation: No palpable anomalies  Palpation: No palpable anomalies   Assessment  Primary Diagnosis & Pertinent Problem List: The primary encounter diagnosis was Chronic low back pain (Primary Source of Pain) (Bilateral) (L>R). Diagnoses of Lumbar facet syndrome (Bilateral) (R>L), Lumbar Spondylosis, Chronic lower extremity pain (Secondary Area of Pain) (Bilateral) (L>R), and Chronic knee pain (Tertiary source of pain) (Bilateral) (Right) were also pertinent to this visit.  Status Diagnosis  Improved Persistent Stable 1. Chronic low back pain (Primary Source of Pain) (Bilateral) (L>R)   2. Lumbar facet syndrome (Bilateral) (R>L)   3. Lumbar Spondylosis   4. Chronic lower extremity pain (Secondary Area of Pain) (Bilateral) (L>R)   5. Chronic knee pain [facility] source of pain) (Bilateral) (Right)     Problems updated and reviewed during this visit: No problems updated. Plan of Care  Pharmacotherapy (Medications Ordered): No orders of the defined types were placed in this encounter.  New Prescriptions   No medications on file   Medications administered today: Mr. Oren had no medications administered during this visit.   Procedure Orders     Radiofrequency,Lumbar Lab Orders  No laboratory test(s) ordered today   Imaging Orders  No imaging studies ordered today   Referral Orders  No referral(s) requested today    Interventional management options: Planned, scheduled, and/or pending:   None at this point.    Considering:   Diagnostic bilateral  Lumbar facet block Possible bilateral lumbar facet RFA  (left side first)  Diagnostic right intra-articular hip injection Possible radiofrequency of the right hipjoint Diagnostic Right L4-5 lumbar epidural steroid injection Diagnostic bilateral intra-articular knee injection Possible series of 5 Hyalgan knee injections.  Diagnostic bilateral Genicular nerve block Possible bilateral Genicular nerve radiofrequencyablation   Palliative PRN treatment(s):   Diagnostic bilateral Lumbar facet block #2 Diagnostic right intra-articular hip injection Diagnostic Right L4-5 lumbar epidural steroid injection Diagnostic bilateral intra-articular knee injection Possible series of 5 Hyalgan knee injections.  Diagnostic bilateral Genicular nerve block   Provider-requested follow-up: Return for Med-Mgmt w/ Dionisio David, NP.  Future Appointments Date Time Provider [place]  08/28/2017 8:45 AM Vevelyn Francois, NP [facility] None   Primary Care Physician: Fritzi Mandes, MD Location: [facility] Note by: Gaspar Cola, MD Date: 07/17/2017; Time: 11:17 AM

## 2017-07-17 NOTE — Progress Notes (Signed)
Safety precautions to be maintained throughout the outpatient stay will include: orient to surroundings, keep bed in low position, maintain call bell within reach at all times, provide assistance with transfer out of bed and ambulation.  

## 2017-08-08 ENCOUNTER — Ambulatory Visit: Payer: Medicare Other | Admitting: Nurse Practitioner

## 2017-08-15 ENCOUNTER — Telehealth: Payer: Self-pay | Admitting: Pain Medicine

## 2017-08-15 NOTE — Telephone Encounter (Signed)
Patient called stating he is having a lot of pain and wants to know if Dr. Laban EmperorNaveira can give him some kind of injection to help until he can get the RF scheduled in Dec. Please call patient

## 2017-08-15 NOTE — Telephone Encounter (Signed)
Spoke with Dr. Laban EmperorNaveira- States OK to bring patient in for right Lumbar facet with sedation. Alona BeneJoyce please make sure he doesn't need a PA and schedule him. Patient understands that secretary will call with appt. If OK with insurance.

## 2017-08-15 NOTE — Telephone Encounter (Signed)
1055am    Called patient back - no answer and no way to leave VM
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
## 2017-08-17 ENCOUNTER — Other Ambulatory Visit: Payer: Self-pay

## 2017-08-17 ENCOUNTER — Encounter: Payer: Self-pay | Admitting: Pain Medicine

## 2017-08-17 ENCOUNTER — Ambulatory Visit (HOSPITAL_BASED_OUTPATIENT_CLINIC_OR_DEPARTMENT_OTHER): Payer: Medicare Other | Admitting: Pain Medicine

## 2017-08-17 ENCOUNTER — Ambulatory Visit
Admission: RE | Admit: 2017-08-17 | Discharge: 2017-08-17 | Disposition: A | Discharge status: Home / Self Care | Payer: Medicare Other | Source: Ambulatory Visit | Attending: Pain Medicine | Admitting: Pain Medicine

## 2017-08-17 VITALS — BP 141/98 | HR 100 | Temp 97.9°F | Resp 13 | Ht 68.0 in | Wt 148.0 lb

## 2017-08-17 DIAGNOSIS — G8929 Other chronic pain: Secondary | ICD-10-CM | POA: Diagnosis not present

## 2017-08-17 DIAGNOSIS — M47816 Spondylosis without myelopathy or radiculopathy, lumbar region: Secondary | ICD-10-CM | POA: Diagnosis present

## 2017-08-17 DIAGNOSIS — M5441 Lumbago with sciatica, right side: Secondary | ICD-10-CM

## 2017-08-17 DIAGNOSIS — M4310 Spondylolisthesis, site unspecified: Secondary | ICD-10-CM | POA: Insufficient documentation

## 2017-08-17 DIAGNOSIS — M4726 Other spondylosis with radiculopathy, lumbar region: Secondary | ICD-10-CM | POA: Diagnosis not present

## 2017-08-17 MED ORDER — MIDAZOLAM HCL 5 MG/5ML IJ SOLN
1.0000 mg | INTRAMUSCULAR | Status: DC | PRN
  Administered 2017-08-17: 2 mg via INTRAVENOUS

## 2017-08-17 MED ORDER — TRIAMCINOLONE ACETONIDE 40 MG/ML IJ SUSP
INTRAMUSCULAR | Status: AC
  Filled 2017-08-17: qty 2

## 2017-08-17 MED ORDER — FENTANYL CITRATE (PF) 100 MCG/2ML IJ SOLN
25.0000 ug | INTRAMUSCULAR | Status: DC | PRN
  Administered 2017-08-17: 50 ug via INTRAVENOUS

## 2017-08-17 MED ORDER — ROPIVACAINE HCL 2 MG/ML IJ SOLN
9.0000 mL | Freq: Once | INTRAMUSCULAR | Status: AC
  Administered 2017-08-17: 10 mL via PERINEURAL

## 2017-08-17 MED ORDER — MIDAZOLAM HCL 5 MG/5ML IJ SOLN
INTRAMUSCULAR | Status: AC
  Filled 2017-08-17: qty 5

## 2017-08-17 MED ORDER — TRIAMCINOLONE ACETONIDE 40 MG/ML IJ SUSP
40.0000 mg | Freq: Once | INTRAMUSCULAR | Status: AC
  Administered 2017-08-17: 40 mg

## 2017-08-17 MED ORDER — LIDOCAINE HCL 2 % IJ SOLN
INTRAMUSCULAR | Status: AC
  Filled 2017-08-17: qty 20

## 2017-08-17 MED ORDER — LACTATED RINGERS IV SOLN: 1000.0000 mL | Freq: Once | INTRAVENOUS | Status: DC

## 2017-08-17 MED ORDER — ROPIVACAINE HCL 2 MG/ML IJ SOLN
INTRAMUSCULAR | Status: AC
  Filled 2017-08-17: qty 20

## 2017-08-17 MED ORDER — LIDOCAINE HCL 2 % IJ SOLN
10.0000 mL | Freq: Once | INTRAMUSCULAR | Status: AC
  Administered 2017-08-17: 400 mg

## 2017-08-17 MED ORDER — FENTANYL CITRATE (PF) 100 MCG/2ML IJ SOLN
INTRAMUSCULAR | Status: AC
  Filled 2017-08-17: qty 2

## 2017-08-17 NOTE — Patient Instructions (Signed)
____________________________________________________________________________________________  Post-Procedure instructions Instructions:  Apply ice: Fill a plastic sandwich bag with crushed ice. Cover it with a small towel and apply to injection site. Apply for 15 minutes then remove x 15 minutes. Repeat sequence on day of procedure, until you go to bed. The purpose is to minimize swelling and discomfort after procedure.  Apply heat: Apply heat to procedure site starting the day following the procedure. The purpose is to treat any soreness and discomfort from the procedure.  Food intake: Start with clear liquids (like water) and advance to regular food, as tolerated.   Physical activities: Keep activities to a minimum for the first 8 hours after the procedure.   Driving: If you have received any sedation, you are not allowed to drive for 24 hours after your procedure.  Blood thinner: Restart your blood thinner 6 hours after your procedure. (Only for those taking blood thinners)  Insulin: As soon as you can eat, you may resume your normal dosing schedule. (Only for those taking insulin)  Infection prevention: Keep procedure site clean and dry.  Post-procedure Pain Diary: Extremely important that this be done correctly and accurately. Recorded information will be used to determine the next step in treatment.  Pain evaluated is that of treated area only. Do not include pain from an untreated area.  Complete every hour, on the hour, for the initial 8 hours. Set an alarm to help you do this part accurately.  Do not go to sleep and have it completed later. It will not be accurate.  Follow-up appointment: Keep your follow-up appointment after the procedure. Usually 2 weeks for most procedures. (6 weeks in the case of radiofrequency.) Bring you pain diary.  Expect:  From numbing medicine (AKA: Local Anesthetics): Numbness or decrease in pain.  Onset: Full effect within 15 minutes of  injected.  Duration: It will depend on the type of local anesthetic used. On the average, 1 to 8 hours.   From steroids: Decrease in swelling or inflammation. Once inflammation is improved, relief of the pain will follow.  Onset of benefits: Depends on the amount of swelling present. The more swelling, the longer it will take for the benefits to be seen. In some cases, up to 10 days.  Duration: Steroids will stay in the system x 2 weeks. Duration of benefits will depend on multiple posibilities including persistent irritating factors.  From procedure: Some discomfort is to be expected once the numbing medicine wears off. This should be minimal if ice and heat are applied as instructed. Call if:  You experience numbness and weakness that gets worse with time, as opposed to wearing off.  New onset bowel or bladder incontinence. (Spinal procedures only)  Emergency Numbers:  Durning business hours (Monday - Thursday, 8:00 AM - 4:00 PM) (Friday, 9:00 AM - 12:00 Noon): [phone]  After hours: [phone] ____________________________________________________________________________________________  ____________________________________________________________________________________________  Preparing for Procedure with Sedation Instructions: . Oral Intake: Do not eat or drink anything for at least 8 hours prior to your procedure. . Transportation: Public transportation is not allowed. Bring an adult driver. The driver must be physically present in our waiting room before any procedure can be started. . Physical Assistance: Bring an adult physically capable of assisting you, in the event you need help. This adult should keep you company at home for at least 6 hours after the procedure. . Blood Pressure Medicine: Take your blood pressure medicine with a sip of water the morning of the procedure. . Blood   thinners:  . Diabetics on insulin: Notify the staff so that you can be scheduled  1st case in the morning. If your diabetes requires high dose insulin, take only  of your normal insulin dose the morning of the procedure and notify the staff that you have done so. . Preventing infections: Shower with an antibacterial soap the morning of your procedure. . Build-up your immune system: Take 1000 mg of Vitamin C with every meal (3 times a day) the day prior to your procedure. . Antibiotics: Inform the staff if you have a condition or reason that requires you to take antibiotics before dental procedures. . Pregnancy: If you are pregnant, call and cancel the procedure. . Sickness: If you have a cold, fever, or any active infections, call and cancel the procedure. . Arrival: You must be in the facility at least 30 minutes prior to your scheduled procedure. . Children: Do not bring children with you. . Dress appropriately: Bring dark clothing that you would not mind if they get stained. . Valuables: Do not bring any jewelry or valuables. Procedure appointments are reserved for interventional treatments only. . No Prescription Refills. . No medication changes will be discussed during procedure appointments. . No disability issues will be discussed. ____________________________________________________________________________________________   
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
## 2017-08-18 ENCOUNTER — Telehealth: Payer: Self-pay

## 2017-08-18 NOTE — Telephone Encounter (Signed)
Post procedure phone call.  Voicemail box has not been set up yet.

## 2017-08-28 ENCOUNTER — Ambulatory Visit: Payer: Medicare Other | Attending: Nurse Practitioner | Admitting: Nurse Practitioner

## 2017-09-06 DIAGNOSIS — M47816 Spondylosis without myelopathy or radiculopathy, lumbar region: Secondary | ICD-10-CM | POA: Insufficient documentation

## 2017-09-07 ENCOUNTER — Ambulatory Visit: Payer: Medicare Other | Admitting: Pain Medicine

## 2017-09-07 DIAGNOSIS — G3184 Mild cognitive impairment, so stated: Secondary | ICD-10-CM | POA: Insufficient documentation

## 2017-09-07 DIAGNOSIS — Z8673 Personal history of transient ischemic attack (TIA), and cerebral infarction without residual deficits: Secondary | ICD-10-CM | POA: Insufficient documentation

## 2017-09-11 ENCOUNTER — Other Ambulatory Visit: Payer: Self-pay | Admitting: Nurse Practitioner

## 2017-09-11 ENCOUNTER — Encounter: Payer: Self-pay | Admitting: Nurse Practitioner

## 2017-09-11 ENCOUNTER — Ambulatory Visit: Payer: Medicare Other | Attending: Nurse Practitioner | Admitting: Nurse Practitioner

## 2017-09-11 ENCOUNTER — Other Ambulatory Visit: Payer: Self-pay

## 2017-09-11 VITALS — BP 142/78 | HR 68 | Temp 98.0°F | Resp 16 | Ht 66.0 in | Wt 155.0 lb

## 2017-09-11 DIAGNOSIS — G8929 Other chronic pain: Secondary | ICD-10-CM

## 2017-09-11 DIAGNOSIS — I10 Essential (primary) hypertension: Secondary | ICD-10-CM | POA: Insufficient documentation

## 2017-09-11 DIAGNOSIS — D869 Sarcoidosis, unspecified: Secondary | ICD-10-CM | POA: Insufficient documentation

## 2017-09-11 DIAGNOSIS — Z7952 Long term (current) use of systemic steroids: Secondary | ICD-10-CM

## 2017-09-11 DIAGNOSIS — Z823 Family history of stroke: Secondary | ICD-10-CM | POA: Diagnosis not present

## 2017-09-11 DIAGNOSIS — M792 Neuralgia and neuritis, unspecified: Secondary | ICD-10-CM

## 2017-09-11 DIAGNOSIS — G894 Chronic pain syndrome: Secondary | ICD-10-CM

## 2017-09-11 DIAGNOSIS — Z79899 Other long term (current) drug therapy: Secondary | ICD-10-CM | POA: Diagnosis not present

## 2017-09-11 DIAGNOSIS — F1190 Opioid use, unspecified, uncomplicated: Secondary | ICD-10-CM

## 2017-09-11 DIAGNOSIS — M5416 Radiculopathy, lumbar region: Secondary | ICD-10-CM | POA: Diagnosis not present

## 2017-09-11 DIAGNOSIS — Z8249 Family history of ischemic heart disease and other diseases of the circulatory system: Secondary | ICD-10-CM | POA: Diagnosis not present

## 2017-09-11 DIAGNOSIS — Z7982 Long term (current) use of aspirin: Secondary | ICD-10-CM | POA: Diagnosis not present

## 2017-09-11 DIAGNOSIS — Z888 Allergy status to other drugs, medicaments and biological substances status: Secondary | ICD-10-CM | POA: Diagnosis not present

## 2017-09-11 DIAGNOSIS — Z9889 Other specified postprocedural states: Secondary | ICD-10-CM | POA: Diagnosis not present

## 2017-09-11 DIAGNOSIS — Z794 Long term (current) use of insulin: Secondary | ICD-10-CM | POA: Insufficient documentation

## 2017-09-11 DIAGNOSIS — M533 Sacrococcygeal disorders, not elsewhere classified: Secondary | ICD-10-CM | POA: Insufficient documentation

## 2017-09-11 DIAGNOSIS — E119 Type 2 diabetes mellitus without complications: Secondary | ICD-10-CM | POA: Insufficient documentation

## 2017-09-11 DIAGNOSIS — M5441 Lumbago with sciatica, right side: Secondary | ICD-10-CM

## 2017-09-11 DIAGNOSIS — G4700 Insomnia, unspecified: Secondary | ICD-10-CM | POA: Diagnosis not present

## 2017-09-11 DIAGNOSIS — E785 Hyperlipidemia, unspecified: Secondary | ICD-10-CM | POA: Diagnosis not present

## 2017-09-11 DIAGNOSIS — Z833 Family history of diabetes mellitus: Secondary | ICD-10-CM | POA: Insufficient documentation

## 2017-09-11 MED ORDER — GABAPENTIN 600 MG PO TABS: 600.0000 mg | ORAL_TABLET | Freq: Three times a day (TID) | ORAL | 0 refills | Status: DC

## 2017-09-11 MED ORDER — OXYCODONE-ACETAMINOPHEN 10-325 MG PO TABS: 1.0000 | ORAL_TABLET | Freq: Four times a day (QID) | ORAL | 0 refills | Status: DC | PRN

## 2017-09-11 NOTE — Progress Notes (Signed)
Nursing Pain Medication Assessment:  Safety precautions to be maintained throughout the outpatient stay will include: orient to surroundings, keep bed in low position, maintain call bell within reach at all times, provide assistance with transfer out of bed and ambulation.  Medication Inspection Compliance: [name] did not comply with our request to bring his pills to be counted. He was reminded that bringing the medication bottles, even when empty, is a requirement.  Medication: None brought in. Pill/Patch Count: None available to be counted. Bottle Appearance: No container available. Did not bring bottle(s) to appointment. Filled Date: N/A Last Medication intake:  Today

## 2017-09-11 NOTE — Patient Instructions (Addendum)
____________________________________________________________________________________________  Medication Rules  Applies to: All patients receiving prescriptions (written or electronic).  Pharmacy of record: Pharmacy where electronic prescriptions will be sent. If written prescriptions are taken to a different pharmacy, please inform the nursing staff. The pharmacy listed in the electronic medical record should be the one where you would like electronic prescriptions to be sent.  Prescription refills: Only during scheduled appointments. Applies to both, written and electronic prescriptions.  NOTE: The following applies primarily to controlled substances (Opioid* Pain Medications).   Patient's responsibilities: 1. Pain Pills: Bring all pain pills to every appointment (except for procedure appointments). 2. Pill Bottles: Bring pills in original pharmacy bottle. Always bring newest bottle. Bring bottle, even if empty. 3. Medication refills: You are responsible for knowing and keeping track of what medications you need refilled. The day before your appointment, write a list of all prescriptions that need to be refilled. Bring that list to your appointment and give it to the admitting nurse. Prescriptions will be written only during appointments. If you forget a medication, it will not be "Called in", "Faxed", or "electronically sent". You will need to get another appointment to get these prescribed. 4. Prescription Accuracy: You are responsible for carefully inspecting your prescriptions before leaving our office. Have the discharge nurse carefully go over each prescription with you, before taking them home. Make sure that your name is accurately spelled, that your address is correct. Check the name and dose of your medication to make sure it is accurate. Check the number of pills, and the written instructions to make sure they are clear and accurate. Make sure that you are given enough medication to  last until your next medication refill appointment. 5. Taking Medication: Take medication as prescribed. Never take more pills than instructed. Never take medication more frequently than prescribed. Taking less pills or less frequently is permitted and encouraged, when it comes to controlled substances (written prescriptions).  6. Inform other Doctors: Always inform, all of your healthcare providers, of all the medications you take. 7. Pain Medication from other Providers: You are not allowed to accept any additional pain medication from any other Doctor or Healthcare provider. There are two exceptions to this rule. (see below) In the event that you require additional pain medication, you are responsible for notifying us, as stated below. 8. Medication Agreement: You are responsible for carefully reading and following our Medication Agreement. This must be signed before receiving any prescriptions from our practice. Safely store a copy of your signed Agreement. Violations to the Agreement will result in no further prescriptions. (Additional copies of our Medication Agreement are available upon request.) 9. Laws, Rules, & Regulations: All patients are expected to follow all Federal and State Laws, Statutes, Rules, & Regulations. Ignorance of the Laws does not constitute a valid excuse. The use of any illegal substances is prohibited. 10. Adopted CDC guidelines & recommendations: Target dosing levels will be at or below 60 MME/day. Use of benzodiazepines** is not recommended.  Exceptions: There are only two exceptions to the rule of not receiving pain medications from other Healthcare Providers. 1. Exception #1 (Emergencies): In the event of an emergency (i.e.: accident requiring emergency care), you are allowed to receive additional pain medication. However, you are responsible for: As soon as you are able, call our office [phone], at any time of the day or night, and leave a message stating your  name, the date and nature of the emergency, and the name and dose of the medication   prescribed. In the event that your call is answered by a member of our staff, make sure to document and save the date, time, and the name of the person that took your information.  2. Exception #2 (Planned Surgery): In the event that you are scheduled by another doctor or dentist to have any type of surgery or procedure, you are allowed (for a period no longer than 30 days), to receive additional pain medication, for the acute post-op pain. However, in this case, you are responsible for picking up a copy of our "Post-op Pain Management for Surgeons" handout, and giving it to your surgeon or dentist. This document is available at our office, and does not require an appointment to obtain it. Simply go to our office during business hours (Monday-Thursday from 8:00 AM to 4:00 PM) (Friday 8:00 AM to 12:00 Noon) or if you have a scheduled appointment with us, prior to your surgery, and ask for it by name. In addition, you will need to provide us with your name, name of your surgeon, type of surgery, and date of procedure or surgery.  *Opioid medications include: morphine, codeine, oxycodone, oxymorphone, hydrocodone, hydromorphone, meperidine, tramadol, tapentadol, buprenorphine, fentanyl, methadone. **Benzodiazepine medications include: diazepam (Valium), alprazolam (Xanax), clonazepam (Klonopine), lorazepam (Ativan), clorazepate (Tranxene), chlordiazepoxide (Librium), estazolam (Prosom), oxazepam (Serax), temazepam (Restoril), triazolam (Halcion)  One prescription for Gabapentin was sent to your pharmacy. You were given 3 prescriptions for Percocet. ____________________________________________________________________________________________

## 2017-09-11 NOTE — Progress Notes (Signed)
Patient's Name: Douglas Andrews  MRN: [id number]  Referring Provider: Fritzi Mandes, MD  DOB: 1951/01/02  PCP: Fritzi Mandes, MD  DOS: 09/11/2017  Note by: Vevelyn Francois NP  Service setting: Ambulatory outpatient  Specialty: Interventional Pain Management  Location: [facility] (AMB) Pain Management Facility    Patient type: Established    Primary Reason(s) for Visit: Encounter for prescription drug management & post-procedure evaluation of chronic illness with mild to moderate exacerbation(Level of risk: moderate) CC: Back Pain (lower)  HPI  Douglas Andrews is a 66 y.o. year old, male patient, who comes today for a post-procedure evaluation and medication management. He has Chronic low back pain (Primary Source of Pain) (Bilateral) (L>R); Chronic pain syndrome; Lumbar DDD (degenerative disc disease); Lumbar Spondylosis; Lumbar radicular pain (Left) (L5 Dermatome); Lumbar facet syndrome (Bilateral) (R>L); Opiate use (60 MME/Day); Uncomplicated opioid dependence ([place]); Long term prescription opiate use; Chronic knee pain (Tertiary source of pain) (Bilateral) (Right); Vitamin D insufficiency; Overweight; Bell palsy; CN (constipation); Dermatitis, eczematoid; Diabetes mellitus ([place]); Dysmetria; GAD (generalized anxiety disorder); Insomnia due to medical condition; Long term current use of systemic steroids; Poorly controlled type 2 diabetes mellitus ([place]); Pulmonary sarcoidosis ([place]); Arthropathy, traumatic, shoulder; Bursitis, trochanteric; Varices, esophageal ([place]); Leg varices; Liver cirrhosis ([place]); Major depressive disorder, single episode; Other long term (current) drug therapy; At risk for falling; Encounter for chronic pain management; Encounter for therapeutic drug level monitoring; Chronic lumbar radicular pain (Left) (L5 Dermatome); Lumbar Levoscoliosis; Neurogenic pain; Osteoarthritis of knee (Right); At high risk for falls; Chest pain; Skin lesion; Pain of left femur (left femoral rod due to  sarcoidosis); Chronic hip pain (Right); Osteoarthritis of knee (Bilateral) (R>L); Essential hypertension; Folliculitis; Chronic sacroiliac joint pain (Left); Chronic hip pain (Left); Chronic thoracic back pain (Left); Diarrhea; Numbness on left side; Gastroenteritis; Other fatigue; Urinary incontinence without sensory awareness; Balanitis; Generalized abdominal pain; Hyperglycemic crisis in diabetes mellitus ([place]); Osteoarthritis of lumbar spine; Chronic lower extremity pain (Secondary Area of Pain) (Bilateral) (L>R); Retrolisthesis (2 mm) of L4 over L5; Anterolisthesis (7 mm) L5 over S1; Lumbar facet arthropathy (L4-5 and L5-S1) (Bilateral); and Lumbar facet osteoarthritis (Bilateral) on their problem list. His primarily concern today is the Back Pain (lower)  Pain Assessment: Location: Lower Back Radiating: n/a Onset: More than a month ago Duration: Chronic pain Quality: Aching Severity: 4 /10 (self-reported pain score)  Note: Reported level is compatible with observation.                         When using our objective Pain Scale, levels between 6 and 10/10 are said to belong in an emergency room, as it progressively worsens from a 6/10, described as severely limiting, requiring emergency care not usually available at an outpatient pain management facility. At a 6/10 level, communication becomes difficult and requires great effort. Assistance to reach the emergency department may be required. Facial flushing and profuse sweating along with potentially dangerous increases in heart rate and blood pressure will be evident. Effect on ADL:   Timing: Constant Modifying factors: heat, ice  Douglas Andrews was last seen on 08/28/2017 for a procedure. During today's appointment we reviewed Douglas Andrews post-procedure results, as well as his outpatient medication regimen. He admits that he has been in the hospital with hyperglycemia. He admits that BG >500. He admits that he did not take his medication on the day  of his Lumbar facet and he was given triamcinolone '80mg'$  total.   Further details on  both, my assessment(s), as well as the proposed treatment plan, please see below.  Controlled Substance Pharmacotherapy Assessment REMS (Risk Evaluation and Mitigation Strategy)  Analgesic:Oxycodone/APAP 10/325 one every 6 hours (40 mg/day) MME/day:60 mg/day   Douglas Martins, RN  09/11/2017 10:31 AM  Sign at close encounter Nursing Pain Medication Assessment:  Safety precautions to be maintained throughout the outpatient stay will include: orient to surroundings, keep bed in low position, maintain call bell within reach at all times, provide assistance with transfer out of bed and ambulation.  Medication Inspection Compliance: Douglas Andrews did not comply with our request to bring his pills to be counted. He was reminded that bringing the medication bottles, even when empty, is a requirement.  Medication: None brought in. Pill/Patch Count: None available to be counted. Bottle Appearance: No container available. Did not bring bottle(s) to appointment. Filled Date: N/A Last Medication intake:  Today   Pharmacokinetics: Liberation and absorption (onset of action): WNL Distribution (time to peak effect): WNL Metabolism and excretion (duration of action): WNL         Pharmacodynamics: Desired effects: Analgesia: Douglas Andrews reports >50% benefit. Functional ability: Patient reports that medication allows him to accomplish basic ADLs Clinically meaningful improvement in function (CMIF): Sustained CMIF goals met Perceived effectiveness: Described as relatively effective, allowing for increase in activities of daily living (ADL) Undesirable effects: Side-effects or Adverse reactions: None reported Monitoring: [place] PMP: Online review of the past 33-monthperiod conducted. Compliant with practice rules and regulations Last UDS on record: No results found for: SUMMARY UDS interpretation: Compliant           Medication Assessment Form: Reviewed. Patient indicates being compliant with therapy Treatment compliance: Compliant Risk Assessment Profile: Aberrant behavior: See prior evaluations. None observed or detected today Comorbid factors increasing risk of overdose: See prior notes. No additional risks detected today Risk of substance use disorder (SUD): Low Opioid Risk Tool - 09/11/17 1027      Family History of Substance Abuse   Alcohol  Negative    Illegal Drugs  Negative    Rx Drugs  Negative      Personal History of Substance Abuse   Alcohol  Negative    Illegal Drugs  Negative    Rx Drugs  Negative      Age   Age between 180-45years   No      History of Preadolescent Sexual Abuse   History of Preadolescent Sexual Abuse  Negative or Male      Psychological Disease   Psychological Disease  Negative    Depression  Negative      Total Score   Opioid Risk Tool Scoring  0    Opioid Risk Interpretation  Low Risk      ORT Scoring interpretation table:  Score <3 = Low Risk for SUD  Score between 4-7 = Moderate Risk for SUD  Score >8 = High Risk for Opioid Abuse   Risk Mitigation Strategies:  Patient Counseling: Covered Patient-Prescriber Agreement (PPA): Present and active  Notification to other healthcare providers: Done  Pharmacologic Plan: No change in therapy, at this time  Post-Procedure Assessment  08/17/2017 Procedure: Bilateral lumbar facet block Pre-procedure pain score:  5/10 Post-procedure pain score: 0/10         Influential Factors: BMI: 25.02 kg/m Intra-procedural challenges: None observed.         Assessment challenges: None detected.              Reported side-effects: None.  Post-procedural adverse reactions or complications: None reported         Sedation: Please see nurses note. When no sedatives are used, the analgesic levels obtained are directly associated to the effectiveness of the local anesthetics. However, when sedation is provided,  the level of analgesia obtained during the initial 1 hour following the intervention, is believed to be the result of a combination of factors. These factors may include, but are not limited to: 1. The effectiveness of the local anesthetics used. 2. The effects of the analgesic(s) and/or anxiolytic(s) used. 3. The degree of discomfort experienced by the patient at the time of the procedure. 4. The patients ability and reliability in recalling and recording the events. 5. The presence and influence of possible secondary gains and/or psychosocial factors. Reported result: Relief experienced during the 1st hour after the procedure: 40 % (Ultra-Short Term Relief)            Interpretative annotation: Clinically appropriate result. Analgesia during this period is likely to be Local Anesthetic and/or IV Sedative (Analgesic/Anxiolytic) related.          Effects of local anesthetic: The analgesic effects attained during this period are directly associated to the localized infiltration of local anesthetics and therefore cary significant diagnostic value as to the etiological location, or anatomical origin, of the pain. Expected duration of relief is directly dependent on the pharmacodynamics of the local anesthetic used. Long-acting (4-6 hours) anesthetics used.  Reported result: Relief during the next 4 to 6 hour after the procedure: 75 % (Short-Term Relief)            Interpretative annotation: Clinically appropriate result. Analgesia during this period is likely to be Local Anesthetic-related.          Long-term benefit: Defined as the period of time past the expected duration of local anesthetics (1 hour for short-acting and 4-6 hours for long-acting). With the possible exception of prolonged sympathetic blockade from the local anesthetics, benefits during this period are typically attributed to, or associated with, other factors such as analgesic sensory neuropraxia, antiinflammatory effects, or beneficial  biochemical changes provided by agents other than the local anesthetics.  Reported result: Extended relief following procedure: 75 % (Long-Term Relief)            Interpretative annotation: Clinically appropriate result. Good relief. No permanent benefit expected. Inflammation plays a part in the etiology to the pain.          Current benefits: Defined as reported results that persistent at this point in time.   Analgesia: >75 %            Function: Somewhat improved ROM: Somewhat improved Interpretative annotation: Recurrence of symptoms. Therapeutic success. Effective diagnostic intervention.          Interpretation: Results would suggest Douglas Andrews to be a good candidate for Radiofrequency Ablation.                  Plan:  Please see "Plan of Care" for details.        Laboratory Chemistry  Inflammation Markers (CRP: Acute Phase) (ESR: Chronic Phase) Lab Results  Component Value Date   CRP <0.5 03/30/2016   ESRSEDRATE 1 03/30/2016                 Rheumatology Markers No results found for: RF, ANA, LABURIC, URICUR, LYMEIGGIGMAB, [facility]              Renal Function Markers Lab Results  Component Value Date  BUN 12 03/30/2016   CREATININE 0.85 03/30/2016   GFRAA >60 03/30/2016   GFRNONAA >60 03/30/2016                 Hepatic Function Markers Lab Results  Component Value Date   AST 24 03/30/2016   ALT 27 03/30/2016   ALBUMIN 4.6 03/30/2016   ALKPHOS 63 03/30/2016                 Electrolytes Lab Results  Component Value Date   NA 136 03/30/2016   K 3.8 03/30/2016   CL 100 (L) 03/30/2016   CALCIUM 9.2 03/30/2016   MG 2.2 03/30/2016                 Neuropathy Markers Lab Results  Component Value Date   VITAMINB12 539 03/30/2016                 Bone Pathology Markers Lab Results  Component Value Date   25OHVITD1 38 03/30/2016   25OHVITD2 3.4 03/30/2016   25OHVITD3 35 03/30/2016                 Coagulation Parameters No results found for: INR,  LABPROT, APTT, PLT, DDIMER               Cardiovascular Markers No results found for: BNP, CKTOTAL, CKMB, TROPONINI, HGB, HCT               CA Markers No results found for: CEA, CA125, LABCA2               Note: Lab results reviewed.  Recent Diagnostic Imaging Results  DG C-Arm 1-60 Min-No Report Fluoroscopy was utilized by the requesting physician.  No radiographic  interpretation.   Complexity Note: Imaging results reviewed. Results shared with Douglas Andrews, using Layman's terms.                         Meds   Current Outpatient Medications:  .  albuterol (PROAIR HFA) 108 (90 Base) MCG/ACT inhaler, INHALE 2 PUFFS BY MOUTH EVERY 4 HOURS ASNEEDED FOR WHEEZING, Disp: , Rfl:  .  aspirin EC 81 MG tablet, Take 81 mg by mouth., Disp: , Rfl:  .  atorvastatin (LIPITOR) 40 MG tablet, Take 40 mg by mouth daily., Disp: , Rfl:  .  cetirizine (ZYRTEC) 10 MG tablet, Take 10 mg by mouth daily. , Disp: , Rfl:  .  Cholecalciferol (VITAMIN D3) 5000 units TABS, Take 1 tablet by mouth daily. , Disp: , Rfl:  .  clotrimazole (LOTRIMIN) 1 % cream, 1 application as needed. , Disp: , Rfl:  .  FLUoxetine (PROZAC) 20 MG capsule, Take 20 mg by mouth daily. , Disp: , Rfl:  .  glucose blood (ACCU-CHEK ACTIVE STRIPS) test strip, Checks 3 times daily, Disp: , Rfl:  .  hydrOXYzine (VISTARIL) 25 MG capsule, Take 50 mg by mouth every 6 (six) hours as needed. , Disp: , Rfl:  .  insulin glargine (LANTUS) 100 UNIT/ML injection, Inject 60 Units into the skin at bedtime. , Disp: , Rfl:  .  insulin lispro (HUMALOG) 100 UNIT/ML injection, Inject 18 Units into the skin 3 (three) times daily with meals., Disp: , Rfl:  .  Insulin Pen Needle (UNIFINE PENTIPS) 31G X 6 MM MISC, , Disp: , Rfl:  .  Insulin Pen Needle (UNIFINE PENTIPS) 31G X 6 MM MISC, USE AS DIRECTED WITH LANTUS & NOVOLOG, Disp: , Rfl:  .  isosorbide mononitrate (IMDUR) 30 MG 24 hr tablet, 30 mg daily. , Disp: , Rfl:  .  lisinopril (PRINIVIL,ZESTRIL) 10 MG tablet,  Take 10 mg by mouth daily. , Disp: , Rfl:  .  metFORMIN (GLUCOPHAGE) 500 MG tablet, Take 1,000 mg by mouth 2 (two) times daily with a meal., Disp: , Rfl:  .  nitroGLYCERIN (NITROSTAT) 0.4 MG SL tablet, Place 0.4 mg under the tongue every 5 (five) minutes as needed for chest pain., Disp: , Rfl:  .  nystatin (MYCOSTATIN/NYSTOP) powder, as needed. , Disp: , Rfl:  .  VICTOZA 18 MG/3ML SOPN, Inject into the skin 3 (three) times daily. , Disp: , Rfl:  .  albuterol (PROVENTIL) (5 MG/ML) 0.5% nebulizer solution, Take 2.5 mg by nebulization every 6 (six) hours as needed for wheezing or shortness of breath. , Disp: , Rfl:  .  gabapentin (NEURONTIN) 600 MG tablet, Take 1 tablet (600 mg total) by mouth every 8 (eight) hours., Disp: 270 tablet, Rfl: 0 .  [START ON 11/10/2017] oxyCODONE-acetaminophen (PERCOCET) 10-325 MG tablet, Take 1 tablet by mouth every 6 (six) hours as needed for pain., Disp: 120 tablet, Rfl: 0 .  [START ON 10/11/2017] oxyCODONE-acetaminophen (PERCOCET) 10-325 MG tablet, Take 1 tablet by mouth every 6 (six) hours as needed for pain., Disp: 120 tablet, Rfl: 0 .  oxyCODONE-acetaminophen (PERCOCET) 10-325 MG tablet, Take 1 tablet by mouth every 6 (six) hours as needed for pain., Disp: 120 tablet, Rfl: 0  ROS  Constitutional: Denies any fever or chills Gastrointestinal: No reported hemesis, hematochezia, vomiting, or acute GI distress Musculoskeletal: Denies any acute onset joint swelling, redness, loss of ROM, or weakness Neurological: No reported episodes of acute onset apraxia, aphasia, dysarthria, agnosia, amnesia, paralysis, loss of coordination, or loss of consciousness  Allergies  Douglas Andrews is allergic to sertraline.  [place]  Drug: Douglas Andrews  reports that he does not use drugs. Alcohol:  reports that he does not drink alcohol. Tobacco:  reports that  has never smoked. he has never used smokeless tobacco. Medical:  has a past medical history of Chronic back pain, Diabetes mellitus  without complication ([place]), Hyperlipidemia, Hypertension, Left leg pain (01/02/2014), Right leg pain (07/15/2015), and Sarcoidosis. Surgical: Douglas Andrews  has a past surgical history that includes Appendectomy; Tonsillectomy; and left rod. Family: family history includes Diabetes in his father; Heart disease in his father and mother; Stroke in his father.  Constitutional Exam  General appearance: Well nourished, well developed, and well hydrated. In no apparent acute distress Vitals:   09/11/17 1024  BP: (!) 142/78  Pulse: 68  Resp: 16  Temp: 98 F (36.7 C)  TempSrc: Oral  SpO2: 100%  Weight: 155 lb (70.3 kg)  Height: '5\' 6"'$  (1.676 m)   BMI Assessment: Estimated body mass index is 25.02 kg/m as calculated from the following:   Height as of this encounter: '5\' 6"'$  (1.676 m).   Weight as of this encounter: 155 lb (70.3 kg). Psych/Mental status: Alert, oriented x 3 (person, place, & time)       Eyes: PERLA Respiratory: No evidence of acute respiratory distress  Lumbar Spine Area Exam  Skin & Axial Inspection: No masses, redness, or swelling Alignment: Symmetrical Functional ROM: Unrestricted ROM      Stability: No instability detected Muscle Tone/Strength: Functionally intact. No obvious neuro-muscular anomalies detected. Sensory (Neurological): Unimpaired Palpation: Uncomfortable       Provocative Tests: Lumbar Hyperextension and rotation test: evaluation deferred today  Lumbar Lateral bending test: evaluation deferred today       Patrick's Maneuver: evaluation deferred today                    Gait & Posture Assessment  Ambulation: Unassisted Gait: Relatively normal for age and body habitus Posture: WNL   Lower Extremity Exam    Side: Right lower extremity  Side: Left lower extremity  Skin & Extremity Inspection: Skin color, temperature, and hair growth are WNL. No peripheral edema or cyanosis. No masses, redness, swelling, asymmetry, or associated skin lesions. No  contractures.  Skin & Extremity Inspection: Skin color, temperature, and hair growth are WNL. No peripheral edema or cyanosis. No masses, redness, swelling, asymmetry, or associated skin lesions. No contractures.  Functional ROM: Unrestricted ROM          Functional ROM: Unrestricted ROM          Muscle Tone/Strength: Functionally intact. No obvious neuro-muscular anomalies detected.  Muscle Tone/Strength: Functionally intact. No obvious neuro-muscular anomalies detected.  Sensory (Neurological): Unimpaired  Sensory (Neurological): Unimpaired  Palpation: No palpable anomalies  Palpation: No palpable anomalies   Assessment  Primary Diagnosis & Pertinent Problem List: The primary encounter diagnosis was Chronic low back pain (Primary Source of Pain) (Bilateral) (L>R). Diagnoses of Chronic lumbar radicular pain (Left) (L5 Dermatome), Chronic sacroiliac joint pain (Left), Neurogenic pain, Chronic pain syndrome, and Long term current use of systemic steroids were also pertinent to this visit.  Status Diagnosis  Controlled Controlled Controlled 1. Chronic low back pain (Primary Source of Pain) (Bilateral) (L>R)   2. Chronic lumbar radicular pain (Left) (L5 Dermatome)   3. Chronic sacroiliac joint pain (Left)   4. Neurogenic pain   5. Chronic pain syndrome   6. Long term current use of systemic steroids     Problems updated and reviewed during this visit: No problems updated. Plan of Care  Pharmacotherapy (Medications Ordered): Meds ordered this encounter  Medications  . oxyCODONE-acetaminophen (PERCOCET) 10-325 MG tablet    Sig: Take 1 tablet by mouth every 6 (six) hours as needed for pain.    Dispense:  120 tablet    Refill:  0    Do not place this medication, or any other prescription from our practice, on "Automatic Refill". Patient may have prescription filled one day early if pharmacy is closed on scheduled refill date. Do not fill until: 11/10/2017 To last until:12/10/2017    Order  Specific Question:   Supervising Provider    Answer:   Milinda Pointer [phone]  . oxyCODONE-acetaminophen (PERCOCET) 10-325 MG tablet    Sig: Take 1 tablet by mouth every 6 (six) hours as needed for pain.    Dispense:  120 tablet    Refill:  0    Do not place this medication, or any other prescription from our practice, on "Automatic Refill". Patient may have prescription filled one day early if pharmacy is closed on scheduled refill date. Do not fill until: 10/11/2017 To last until: 11/10/2017    Order Specific Question:   Supervising Provider    Answer:   Milinda Pointer [phone]  . gabapentin (NEURONTIN) 600 MG tablet    Sig: Take 1 tablet (600 mg total) by mouth every 8 (eight) hours.    Dispense:  270 tablet    Refill:  0    Do not place this medication, or any other prescription from our practice, on "Automatic Refill". Patient may have prescription filled one day early if pharmacy is closed  on scheduled refill date.    Order Specific Question:   Supervising Provider    Answer:   Milinda Pointer [phone]  . oxyCODONE-acetaminophen (PERCOCET) 10-325 MG tablet    Sig: Take 1 tablet by mouth every 6 (six) hours as needed for pain.    Dispense:  120 tablet    Refill:  0    Do not place this medication, or any other prescription from our practice, on "Automatic Refill". Patient may have prescription filled one day early if pharmacy is closed on scheduled refill date. Do not fill until:09/11/2017 To last until: 10/11/2017    Order Specific Question:   Supervising Provider    Answer:   Milinda Pointer [phone]  This SmartLink is deprecated. Use AVSMEDLIST instead to display the medication list for a patient. Medications administered today: Douglas Andrews had no medications administered during this visit. Lab-work, procedure(s), and/or referral(s): No orders of the defined types were placed in this encounter.  Imaging and/or referral(s): None  Interventional management  options: Planned, scheduled, and/or pending:   Right Lumbar Facet RFA   Considering:   Diagnostic bilateral Lumbar facet block Possible bilateral lumbar facet RFA (left side first)  Diagnostic right intra-articular hip injection Possible radiofrequency of the right hipjoint Diagnostic Right L4-5 lumbar epidural steroid injection Diagnostic bilateral intra-articular knee injection Possible series of 5 Hyalgan knee injections.  Diagnostic bilateral Genicular nerve block Possible bilateral Genicular nerve radiofrequencyablation   Palliative PRN treatment(s):   Diagnostic bilateral Lumbar facet block #2 Diagnostic right intra-articular hip injection Diagnostic Right L4-5 lumbar epidural steroid injection Diagnostic bilateral intra-articular knee injection Possible series of 5 Hyalgan knee injections.  Diagnostic bilateral Genicular nerve block   Provider-requested follow-up: Return in about 3 months (around 12/10/2017) for MedMgmt.  Future Appointments  Date Time Provider [place]  09/14/2017  2:15 PM Milinda Pointer, MD [facility] None  12/06/2017  9:15 AM Vevelyn Francois, NP [facility] None   Primary Care Physician: Fritzi Mandes, MD Location: [facility] Outpatient Pain Management Facility Note by: Vevelyn Francois NP Date: 09/11/2017; Time: 11:22 AM  Pain Score Disclaimer: We use the NRS-11 scale. This is a self-reported, subjective measurement of pain severity with only modest accuracy. It is used primarily to identify changes within a particular patient. It must be understood that outpatient pain scales are significantly less accurate that those used for research, where they can be applied under ideal controlled circumstances with minimal exposure to variables. In reality, the score is likely to be a combination of pain intensity and pain affect, where pain affect describes the degree of emotional arousal or changes in action readiness caused by the sensory experience  of pain. Factors such as social and work situation, setting, emotional state, anxiety levels, expectation, and prior pain experience may influence pain perception and show large inter-individual differences that may also be affected by time variables.  Patient instructions provided during this appointment: Patient Instructions   ____________________________________________________________________________________________  Medication Rules  Applies to: All patients receiving prescriptions (written or electronic).  Pharmacy of record: Pharmacy where electronic prescriptions will be sent. If written prescriptions are taken to a different pharmacy, please inform the nursing staff. The pharmacy listed in the electronic medical record should be the one where you would like electronic prescriptions to be sent.  Prescription refills: Only during scheduled appointments. Applies to both, written and electronic prescriptions.  NOTE: The following applies primarily to controlled substances (Opioid* Pain Medications).   Patient's responsibilities: 1. Pain Pills: Bring all pain pills  to every appointment (except for procedure appointments). 2. Pill Bottles: Bring pills in original pharmacy bottle. Always bring newest bottle. Bring bottle, even if empty. 3. Medication refills: You are responsible for knowing and keeping track of what medications you need refilled. The day before your appointment, write a list of all prescriptions that need to be refilled. Bring that list to your appointment and give it to the admitting nurse. Prescriptions will be written only during appointments. If you forget a medication, it will not be "Called in", "Faxed", or "electronically sent". You will need to get another appointment to get these prescribed. 4. Prescription Accuracy: You are responsible for carefully inspecting your prescriptions before leaving our office. Have the discharge nurse carefully go over each prescription  with you, before taking them home. Make sure that your name is accurately spelled, that your address is correct. Check the name and dose of your medication to make sure it is accurate. Check the number of pills, and the written instructions to make sure they are clear and accurate. Make sure that you are given enough medication to last until your next medication refill appointment. 5. Taking Medication: Take medication as prescribed. Never take more pills than instructed. Never take medication more frequently than prescribed. Taking less pills or less frequently is permitted and encouraged, when it comes to controlled substances (written prescriptions).  6. Inform other Doctors: Always inform, all of your healthcare providers, of all the medications you take. 7. Pain Medication from other Providers: You are not allowed to accept any additional pain medication from any other Doctor or Healthcare provider. There are two exceptions to this rule. (see below) In the event that you require additional pain medication, you are responsible for notifying us, as stated below. 8. Medication Agreement: You are responsible for carefully reading and following our Medication Agreement. This must be signed before receiving any prescriptions from our practice. Safely store a copy of your signed Agreement. Violations to the Agreement will result in no further prescriptions. (Additional copies of our Medication Agreement are available upon request.) 9. Laws, Rules, & Regulations: All patients are expected to follow all Federal and [employer], [employer], Rules, [employer]. Ignorance of the Laws does not constitute a valid excuse. The use of any illegal substances is prohibited. 10. Adopted CDC guidelines & recommendations: Target dosing levels will be at or below 60 MME/day. Use of benzodiazepines** is not recommended.  Exceptions: There are only two exceptions to the rule of not receiving pain medications from other  Healthcare Providers. 1. Exception #1 (Emergencies): In the event of an emergency (i.e.: accident requiring emergency care), you are allowed to receive additional pain medication. However, you are responsible for: As soon as you are able, call our office [phone], at any time of the day or night, and leave a message stating your name, the date and nature of the emergency, and the name and dose of the medication prescribed. In the event that your call is answered by a member of our staff, make sure to document and save the date, time, and the name of the person that took your information.  2. Exception #2 (Planned Surgery): In the event that you are scheduled by another doctor or dentist to have any type of surgery or procedure, you are allowed (for a period no longer than 30 days), to receive additional pain medication, for the acute post-op pain. However, in this case, you are responsible for picking up a copy of our "Post-op Pain  Management for Surgeons" handout, and giving it to your surgeon or dentist. This document is available at our office, and does not require an appointment to obtain it. Simply go to our office during business hours (Monday-Thursday from 8:00 AM to 4:00 PM) (Friday 8:00 AM to 12:00 Noon) or if you have a scheduled appointment with [place], prior to your surgery, and ask for it by name. In addition, you will need to provide [place] with your name, name of your surgeon, type of surgery, and date of procedure or surgery.  *Opioid medications include: morphine, codeine, oxycodone, oxymorphone, hydrocodone, hydromorphone, meperidine, tramadol, tapentadol, buprenorphine, fentanyl, methadone. **Benzodiazepine medications include: diazepam (Valium), alprazolam (Xanax), clonazepam (Klonopine), lorazepam (Ativan), clorazepate (Tranxene), chlordiazepoxide (Librium), estazolam (Prosom), oxazepam (Serax), temazepam (Restoril), triazolam (Halcion)  One prescription for Gabapentin was sent to your  pharmacy. You were given 3 prescriptions for Percocet. ____________________________________________________________________________________________

## 2017-09-13 NOTE — Progress Notes (Signed)
Patient's Name: Douglas Andrews  MRN: [id number]  Referring Provider: Katharine Look, MD  DOB: 1951-06-25  PCP: Katharine Look, MD  DOS: 09/14/2017  Note by: Oswaldo Done, MD  Service setting: Ambulatory outpatient  Specialty: Interventional Pain Management  Patient type: Established  Location: [facility] (AMB) Pain Management Facility  Visit type: Interventional Procedure   Primary Reason for Visit: Interventional Pain Management Treatment. CC: Back Pain (lower back right)  Procedure:  Anesthesia, Analgesia, Anxiolysis:  Type: Therapeutic Medial Branch Facet Radiofrequency Ablation Region: Lumbar Level: L2, L3, L4, L5, & S1 Medial Branch Level(s) Laterality: Right-Sided  Type: Local Anesthesia with Moderate (Conscious) Sedation Local Anesthetic: Lidocaine 1% Route: Intravenous (IV) IV Access: Secured Sedation: Meaningful verbal contact was maintained at all times during the procedure  Indication(s): Analgesia and Anxiety   Indications: 1. Lumbar facet syndrome (Bilateral) (R>L)   2. Lumbar facet osteoarthritis (Bilateral)   3. Lumbar facet arthropathy (L4-5 and L5-S1) (Bilateral)   4. Chronic low back pain (Primary Source of Pain) (Bilateral) (L>R)   5. Acute postoperative pain    [name] has either failed to respond, was unable to tolerate, or simply did not get enough benefit from other more conservative therapies including, but not limited to: 1. Over-the-counter medications 2. Anti-inflammatory medications 3. Muscle relaxants 4. Membrane stabilizers 5. Opioids 6. Physical therapy 7. Modalities (Heat, ice, etc.) 8. Invasive techniques such as nerve blocks. [name] has attained more than 50% relief of the pain from a series of diagnostic injections conducted in separate occasions.  Pain Score: Pre-procedure: 4 /10 Post-procedure: 0-No pain/10  Pre-op Assessment:  [name] is a 66 y.o. (year old), male patient, seen today for interventional treatment. He  has  a past surgical history that includes Appendectomy; Tonsillectomy; and left rod. [name] has a current medication list which includes the following prescription(s): albuterol, aspirin ec, atorvastatin, cetirizine, vitamin d3, clotrimazole, fluoxetine, gabapentin, glucose blood, hydroxyzine, insulin glargine, insulin lispro, unifine pentips, insulin pen needle, isosorbide mononitrate, lisinopril, metformin, nitroglycerin, nystatin, oxycodone-acetaminophen, oxycodone-acetaminophen, oxycodone-acetaminophen, victoza, albuterol, and hydrocodone-acetaminophen, and the following Facility-Administered Medications: fentanyl and midazolam. His primarily concern today is the Back Pain (lower back right)  Initial Vital Signs: There were no vitals taken for this visit. BMI: Estimated body mass index is 23.22 kg/m as calculated from the following:   Height as of this encounter: 5' 8.5" (1.74 m).   Weight as of this encounter: 155 lb (70.3 kg).  Risk Assessment: Allergies: Reviewed. He is allergic to sertraline.  Allergy Precautions: None required Coagulopathies: Reviewed. None identified.  Blood-thinner therapy: None at this time Active Infection(s): Reviewed. None identified. [name] is afebrile  Site Confirmation: [name] was asked to confirm the procedure and laterality before marking the site Procedure checklist: Completed Consent: Before the procedure and under the influence of no sedative(s), amnesic(s), or anxiolytics, the patient was informed of the treatment options, risks and possible complications. To fulfill our ethical and legal obligations, as recommended by the American Medical Association's Code of Ethics, I have informed the patient of my clinical impression; the nature and purpose of the treatment or procedure; the risks, benefits, and possible complications of the intervention; the alternatives, including doing nothing; the risk(s) and benefit(s) of the alternative treatment(s) or  procedure(s); and the risk(s) and benefit(s) of doing nothing. The patient was provided information about the general risks and possible complications associated with the procedure. These may include, but are not limited to: failure to achieve desired goals, infection, bleeding,  organ or nerve damage, allergic reactions, paralysis, and death. In addition, the patient was informed of those risks and complications associated to Spine-related procedures, such as failure to decrease pain; infection (i.e.: Meningitis, epidural or intraspinal abscess); bleeding (i.e.: epidural hematoma, subarachnoid hemorrhage, or any other type of intraspinal or peri-dural bleeding); organ or nerve damage (i.e.: Any type of peripheral nerve, nerve root, or spinal cord injury) with subsequent damage to sensory, motor, and/or autonomic systems, resulting in permanent pain, numbness, and/or weakness of one or several areas of the body; allergic reactions; (i.e.: anaphylactic reaction); and/or death. Furthermore, the patient was informed of those risks and complications associated with the medications. These include, but are not limited to: allergic reactions (i.e.: anaphylactic or anaphylactoid reaction(s)); adrenal axis suppression; blood sugar elevation that in diabetics may result in ketoacidosis or comma; water retention that in patients with history of congestive heart failure may result in shortness of breath, pulmonary edema, and decompensation with resultant heart failure; weight gain; swelling or edema; medication-induced neural toxicity; particulate matter embolism and blood vessel occlusion with resultant organ, and/or nervous system infarction; and/or aseptic necrosis of one or more joints. Finally, the patient was informed that Medicine is not an exact science; therefore, there is also the possibility of unforeseen or unpredictable risks and/or possible complications that may result in a catastrophic outcome. The patient  indicated having understood very clearly. We have given the patient no guarantees and we have made no promises. Enough time was given to the patient to ask questions, all of which were answered to the patient's satisfaction. Mr. Oleksy has indicated that he wanted to continue with the procedure. Attestation: I, the ordering provider, attest that I have discussed with the patient the benefits, risks, side-effects, alternatives, likelihood of achieving goals, and potential problems during recovery for the procedure that I have provided informed consent. Date: 09/14/2017; Time: 10:50 PM  Pre-Procedure Preparation:  Monitoring: As per clinic protocol. Respiration, ETCO2, SpO2, BP, heart rate and rhythm monitor placed and checked for adequate function Safety Precautions: Patient was assessed for positional comfort and pressure points before starting the procedure. Time-out: I initiated and conducted the "Time-out" before starting the procedure, as per protocol. The patient was asked to participate by confirming the accuracy of the "Time Out" information. Verification of the correct person, site, and procedure were performed and confirmed by me, the nursing staff, and the patient. "Time-out" conducted as per Joint Commission's Universal Protocol (UP.01.01.01). "Time-out" Date & Time: 09/14/2017; 1506 hrs.  Description of Procedure Process:   Position: Prone Target Area: For Lumbar Facet blocks, the target is the groove formed by the junction of the transverse process and superior articular process. For the L5 dorsal ramus, the target is the notch between superior articular process and sacral ala. For the S1 dorsal ramus, the target is the superior and lateral edge of the posterior S1 Sacral foramen. Approach: Paraspinal approach. Area Prepped: Entire Posterior Lumbosacral Region Prepping solution: Hibiclens (4.0% Chlorhexidine gluconate solution) Safety Precautions: Aspiration looking for blood return was  conducted prior to all injections. At no point did we inject any substances, as a needle was being advanced. No attempts were made at seeking any paresthesias. Safe injection practices and needle disposal techniques used. Medications properly checked for expiration dates. SDV (single dose vial) medications used. Description of the Procedure: Protocol guidelines were followed. The patient was placed in position over the procedure table. The target area was identified and the area prepped in the usual manner. The skin  and muscle were infiltrated with local anesthetic. Appropriate amount of time allowed to pass for local anesthetics to take effect. Radiofrequency needles were introduced to the target area using fluoroscopic guidance. Using the NeuroTherm NT1100 Radiofrequency Generator, sensory stimulation using 50 Hz was used to locate & identify the nerve, making sure that the needle was positioned such that there was no sensory stimulation below 0.3 V or above 0.7 V. Stimulation using 2 Hz was used to evaluate the motor component. Care was taken not to lesion any nerves that demonstrated motor stimulation of the lower extremities at an output of less than 2.5 times that of the sensory threshold, or a maximum of 2.0 V. Once satisfactory placement of the needles was achieved, the numbing solution was slowly injected after negative aspiration. After waiting for at least 2 minutes, the ablation was performed at 80 degrees C for 60 seconds, using regular Radiofrequency settings. Once the procedure was completed, the needles were then removed and the area cleansed, making sure to leave some of the prepping solution back to take advantage of its long term bactericidal properties. Intra-operative Compliance: Compliant  Illustration of the posterior view of the lumbar spine and the posterior neural structures. Laminae of L2 through S1 are labeled. DPRL5, dorsal primary ramus of L5; DPRS1, dorsal primary ramus of S1;  DPR3, dorsal primary ramus of L3; FJ, facet (zygapophyseal) joint L3-L4; I, inferior articular process of L4; LB1, lateral branch of dorsal primary ramus of L1; IAB, inferior articular branches from L3 medial branch (supplies L4-L5 facet joint); IBP, intermediate branch plexus; MB3, medial branch of dorsal primary ramus of L3; NR3, third lumbar nerve root; S, superior articular process of L5; SAB, superior articular branches from L4 (supplies L4-5 facet joint also); TP3, transverse process of L3.  Vitals:   09/14/17 1536 09/14/17 1543 09/14/17 1553 09/14/17 1602  BP: 120/86 (!) 136/92 115/82 124/83  Pulse: 64 63 60 61  Resp: 16 14 16 16   Temp:  97.9 F (36.6 C)    TempSrc:      SpO2: 98% 100% 100% 100%  Weight:      Height:        Start Time: 1506 hrs. End Time: 1536 hrs. Materials & Medications:  Needle(s) Type: Teflon-coated, curved tip, Radiofrequency needle(s) Gauge: 22G Length: 10cm Medication(s): We administered lactated ringers, midazolam, fentaNYL, lidocaine, triamcinolone acetonide, and ropivacaine (PF) 2 mg/mL (0.2%). Please see chart orders for dosing details.  Imaging Guidance (Spinal):  Type of Imaging Technique: Fluoroscopy Guidance (Spinal) Indication(s): Assistance in needle guidance and placement for procedures requiring needle placement in or near specific anatomical locations not easily accessible without such assistance. Exposure Time: Please see nurses notes. Contrast: None used. Fluoroscopic Guidance: I was personally present during the use of fluoroscopy. "Tunnel Vision Technique" used to obtain the best possible view of the target area. Parallax error corrected before commencing the procedure. "Direction-depth-direction" technique used to introduce the needle under continuous pulsed fluoroscopy. Once target was reached, antero-posterior, oblique, and lateral fluoroscopic projection used confirm needle placement in all planes. Images permanently stored in  EMR. Interpretation: No contrast injected. I personally interpreted the imaging intraoperatively. Adequate needle placement confirmed in multiple planes. Permanent images saved into the patient's record.  Antibiotic Prophylaxis:  Indication(s): None identified Antibiotic given: None  Post-operative Assessment:  EBL: None Complications: No immediate post-treatment complications observed by team, or reported by patient. Note: The patient tolerated the entire procedure well. A repeat set of vitals were taken after the procedure and  the patient was kept under observation following institutional policy, for this type of procedure. Post-procedural neurological assessment was performed, showing return to baseline, prior to discharge. The patient was provided with post-procedure discharge instructions, including a section on how to identify potential problems. Should any problems arise concerning this procedure, the patient was given instructions to immediately contact us, at any time, without hesitation. In any case, we plan to contact the patient by telephone for a follow-up status report regarding this interventional procedure. Comments:  No additional relevant information.  Plan of Care    Imaging Orders     DG C-Arm 1-60 Min-No Report  Procedure Orders     Radiofrequency,Lumbar  Medications ordered for procedure: Meds ordered this encounter  Medications  . lactated ringers infusion 1,000 mL  . midazolam (VERSED) 5 MG/5ML injection 1-2 mg    Make sure Flumazenil is available in the pyxis when using this medication. If oversedation occurs, administer 0.2 mg IV over 15 sec. If after 45 sec no response, administer 0.2 mg again over 1 min; may repeat at 1 min intervals; not to exceed 4 doses (1 mg)  . fentaNYL (SUBLIMAZE) injection 25-50 mcg    Make sure Narcan is available in the pyxis when using this medication. In the event of respiratory depression (RR< 8/min): Titrate NARCAN (naloxone) in  increments of 0.1 to 0.2 mg IV at 2-3 minute intervals, until desired degree of reversal.  . lidocaine (XYLOCAINE) 2 % (with pres) injection 200 mg  . triamcinolone acetonide (KENALOG-40) injection 40 mg  . ropivacaine (PF) 2 mg/mL (0.2%) (NAROPIN) injection 9 mL  . HYDROcodone-acetaminophen (NORCO/VICODIN) 5-325 MG tablet    Sig: Take 1 tablet by mouth every 6 (six) hours as needed for up to 7 days for moderate pain.    Dispense:  28 tablet    Refill:  0    For acute post-operative pain. Not to be refilled. To last 7 days.   Medications administered: We administered lactated ringers, midazolam, fentaNYL, lidocaine, triamcinolone acetonide, and ropivacaine (PF) 2 mg/mL (0.2%).  See the medical record for exact dosing, route, and time of administration.  This SmartLink is deprecated. Use AVSMEDLIST instead to display the medication list for a patient. Disposition: Discharge home  Discharge Date & Time: 09/14/2017; 1605 hrs.   Physician-requested Follow-up: Return for contralateral RFA in about 2-weeks. Future Appointments  Date Time Provider Department Center  11/14/2017 10:30 AM Delano MetzNaveira, Jennell Janosik, MD [facility] None  12/06/2017  9:15 AM Barbette MerinoKing, Crystal [facility]   Primary Care Physician: Katharine LookFoley, Stephanie J, MD [facility] Note by: Oswaldo DoneFrancisco A Tangia Pinard, MD Date: 09/14/2017; Time: 4:47 PM  Disclaimer:  Medicine is not an Visual merchandiserexact science. The only guarantee in medicine is that nothing is guaranteed. It is important to note that the decision to proceed with this intervention was based on the information collected from the patient. The Data and conclusions were drawn from the patient's questionnaire, the interview, and the physical examination. Because the information was provided in large part by the patient, it cannot be guaranteed that it has not been purposely or unconsciously manipulated. Every effort has been made to obtain as much relevant data  as possible for this evaluation. It is important to note that the conclusions that lead to this procedure are derived in large part from the available data. Always take into account that the treatment will also be dependent on availability of resources and existing treatment guidelines, considered by other Pain  Management Practitioners as being common knowledge and practice, at the time of the intervention. For Medico-Legal purposes, it is also important to point out that variation in procedural techniques and pharmacological choices are the acceptable norm. The indications, contraindications, technique, and results of the above procedure should only be interpreted and judged by a Board-Certified Interventional Pain Specialist with extensive familiarity and expertise in the same exact procedure and technique.

## 2017-09-14 ENCOUNTER — Ambulatory Visit
Admission: RE | Admit: 2017-09-14 | Discharge: 2017-09-14 | Disposition: A | Discharge status: Home / Self Care | Payer: Medicare Other | Source: Ambulatory Visit | Attending: Pain Medicine | Admitting: Pain Medicine

## 2017-09-14 ENCOUNTER — Ambulatory Visit (HOSPITAL_BASED_OUTPATIENT_CLINIC_OR_DEPARTMENT_OTHER): Payer: Medicare Other | Admitting: Pain Medicine

## 2017-09-14 ENCOUNTER — Encounter: Payer: Self-pay | Admitting: Pain Medicine

## 2017-09-14 VITALS — BP 124/83 | HR 61 | Temp 97.9°F | Resp 16 | Ht 68.5 in | Wt 155.0 lb

## 2017-09-14 DIAGNOSIS — Z7982 Long term (current) use of aspirin: Secondary | ICD-10-CM | POA: Insufficient documentation

## 2017-09-14 DIAGNOSIS — M47816 Spondylosis without myelopathy or radiculopathy, lumbar region: Secondary | ICD-10-CM | POA: Diagnosis not present

## 2017-09-14 DIAGNOSIS — G8918 Other acute postprocedural pain: Secondary | ICD-10-CM | POA: Insufficient documentation

## 2017-09-14 DIAGNOSIS — M1288 Other specific arthropathies, not elsewhere classified, other specified site: Secondary | ICD-10-CM | POA: Diagnosis not present

## 2017-09-14 DIAGNOSIS — M5441 Lumbago with sciatica, right side: Secondary | ICD-10-CM

## 2017-09-14 DIAGNOSIS — Z888 Allergy status to other drugs, medicaments and biological substances status: Secondary | ICD-10-CM | POA: Insufficient documentation

## 2017-09-14 DIAGNOSIS — Z79891 Long term (current) use of opiate analgesic: Secondary | ICD-10-CM | POA: Diagnosis not present

## 2017-09-14 DIAGNOSIS — Z794 Long term (current) use of insulin: Secondary | ICD-10-CM | POA: Insufficient documentation

## 2017-09-14 DIAGNOSIS — G8929 Other chronic pain: Secondary | ICD-10-CM | POA: Diagnosis not present

## 2017-09-14 DIAGNOSIS — F419 Anxiety disorder, unspecified: Secondary | ICD-10-CM | POA: Diagnosis not present

## 2017-09-14 DIAGNOSIS — M488X6 Other specified spondylopathies, lumbar region: Secondary | ICD-10-CM | POA: Diagnosis not present

## 2017-09-14 DIAGNOSIS — M545 Low back pain: Secondary | ICD-10-CM | POA: Insufficient documentation

## 2017-09-14 DIAGNOSIS — Z79899 Other long term (current) drug therapy: Secondary | ICD-10-CM | POA: Insufficient documentation

## 2017-09-14 HISTORY — DX: Other acute postprocedural pain: G89.18

## 2017-09-14 MED ORDER — HYDROCODONE-ACETAMINOPHEN 5-325 MG PO TABS: 1.0000 | ORAL_TABLET | Freq: Four times a day (QID) | ORAL | 0 refills | Status: AC | PRN

## 2017-09-14 MED ORDER — LACTATED RINGERS IV SOLN
1000.0000 mL | Freq: Once | INTRAVENOUS | Status: AC
  Administered 2017-09-14: 1000 mL via INTRAVENOUS

## 2017-09-14 MED ORDER — TRIAMCINOLONE ACETONIDE 40 MG/ML IJ SUSP
40.0000 mg | Freq: Once | INTRAMUSCULAR | Status: AC
Start: 2017-09-14 — End: 2017-09-14
  Administered 2017-09-14: 40 mg
  Filled 2017-09-14: qty 1

## 2017-09-14 MED ORDER — ROPIVACAINE HCL 2 MG/ML IJ SOLN
9.0000 mL | Freq: Once | INTRAMUSCULAR | Status: AC
  Administered 2017-09-14: 10 mL via PERINEURAL
  Filled 2017-09-14: qty 10

## 2017-09-14 MED ORDER — FENTANYL CITRATE (PF) 100 MCG/2ML IJ SOLN
25.0000 ug | INTRAMUSCULAR | Status: DC | PRN
  Administered 2017-09-14: 100 ug via INTRAVENOUS
  Filled 2017-09-14: qty 2

## 2017-09-14 MED ORDER — MIDAZOLAM HCL 5 MG/5ML IJ SOLN
1.0000 mg | INTRAMUSCULAR | Status: DC | PRN
  Administered 2017-09-14: 3 mg via INTRAVENOUS
  Filled 2017-09-14: qty 5

## 2017-09-14 MED ORDER — LIDOCAINE HCL 2 % IJ SOLN
10.0000 mL | Freq: Once | INTRAMUSCULAR | Status: AC
  Administered 2017-09-14: 400 mg
  Filled 2017-09-14: qty 20

## 2017-09-14 NOTE — Patient Instructions (Signed)

## 2017-09-15 ENCOUNTER — Telehealth: Payer: Self-pay

## 2017-09-15 LAB — TOXASSURE SELECT 13 (MW), URINE

## 2017-11-07 DIAGNOSIS — I6381 Other cerebral infarction due to occlusion or stenosis of small artery: Secondary | ICD-10-CM | POA: Insufficient documentation

## 2017-11-14 ENCOUNTER — Ambulatory Visit: Payer: Medicare Other | Admitting: Pain Medicine

## 2017-12-04 ENCOUNTER — Other Ambulatory Visit: Payer: Self-pay | Admitting: Nurse Practitioner

## 2017-12-04 DIAGNOSIS — M792 Neuralgia and neuritis, unspecified: Secondary | ICD-10-CM

## 2017-12-04 DIAGNOSIS — G894 Chronic pain syndrome: Secondary | ICD-10-CM

## 2017-12-06 ENCOUNTER — Other Ambulatory Visit: Payer: Self-pay

## 2017-12-06 ENCOUNTER — Ambulatory Visit: Payer: Medicare Other | Attending: Nurse Practitioner | Admitting: Nurse Practitioner

## 2017-12-06 ENCOUNTER — Encounter: Payer: Self-pay | Admitting: Nurse Practitioner

## 2017-12-06 VITALS — BP 155/91 | HR 79 | Temp 97.9°F | Ht 68.0 in | Wt 159.0 lb

## 2017-12-06 DIAGNOSIS — G894 Chronic pain syndrome: Secondary | ICD-10-CM | POA: Diagnosis not present

## 2017-12-06 DIAGNOSIS — M5136 Other intervertebral disc degeneration, lumbar region: Secondary | ICD-10-CM | POA: Diagnosis not present

## 2017-12-06 DIAGNOSIS — K7460 Unspecified cirrhosis of liver: Secondary | ICD-10-CM | POA: Insufficient documentation

## 2017-12-06 DIAGNOSIS — M533 Sacrococcygeal disorders, not elsewhere classified: Secondary | ICD-10-CM | POA: Insufficient documentation

## 2017-12-06 DIAGNOSIS — M5116 Intervertebral disc disorders with radiculopathy, lumbar region: Secondary | ICD-10-CM | POA: Diagnosis not present

## 2017-12-06 DIAGNOSIS — G3184 Mild cognitive impairment, so stated: Secondary | ICD-10-CM | POA: Diagnosis not present

## 2017-12-06 DIAGNOSIS — M792 Neuralgia and neuritis, unspecified: Secondary | ICD-10-CM

## 2017-12-06 DIAGNOSIS — D869 Sarcoidosis, unspecified: Secondary | ICD-10-CM | POA: Diagnosis not present

## 2017-12-06 DIAGNOSIS — Z8673 Personal history of transient ischemic attack (TIA), and cerebral infarction without residual deficits: Secondary | ICD-10-CM | POA: Insufficient documentation

## 2017-12-06 DIAGNOSIS — M4316 Spondylolisthesis, lumbar region: Secondary | ICD-10-CM | POA: Diagnosis not present

## 2017-12-06 DIAGNOSIS — I10 Essential (primary) hypertension: Secondary | ICD-10-CM | POA: Diagnosis not present

## 2017-12-06 DIAGNOSIS — Z79891 Long term (current) use of opiate analgesic: Secondary | ICD-10-CM | POA: Insufficient documentation

## 2017-12-06 DIAGNOSIS — E663 Overweight: Secondary | ICD-10-CM | POA: Insufficient documentation

## 2017-12-06 DIAGNOSIS — F411 Generalized anxiety disorder: Secondary | ICD-10-CM | POA: Insufficient documentation

## 2017-12-06 DIAGNOSIS — E1165 Type 2 diabetes mellitus with hyperglycemia: Secondary | ICD-10-CM | POA: Insufficient documentation

## 2017-12-06 DIAGNOSIS — G4700 Insomnia, unspecified: Secondary | ICD-10-CM | POA: Insufficient documentation

## 2017-12-06 DIAGNOSIS — L309 Dermatitis, unspecified: Secondary | ICD-10-CM | POA: Diagnosis not present

## 2017-12-06 DIAGNOSIS — M25551 Pain in right hip: Secondary | ICD-10-CM

## 2017-12-06 DIAGNOSIS — F329 Major depressive disorder, single episode, unspecified: Secondary | ICD-10-CM | POA: Insufficient documentation

## 2017-12-06 DIAGNOSIS — R1084 Generalized abdominal pain: Secondary | ICD-10-CM | POA: Diagnosis not present

## 2017-12-06 DIAGNOSIS — E559 Vitamin D deficiency, unspecified: Secondary | ICD-10-CM | POA: Insufficient documentation

## 2017-12-06 DIAGNOSIS — Z79899 Other long term (current) drug therapy: Secondary | ICD-10-CM | POA: Insufficient documentation

## 2017-12-06 DIAGNOSIS — G8929 Other chronic pain: Secondary | ICD-10-CM

## 2017-12-06 DIAGNOSIS — D860 Sarcoidosis of lung: Secondary | ICD-10-CM | POA: Insufficient documentation

## 2017-12-06 DIAGNOSIS — Z794 Long term (current) use of insulin: Secondary | ICD-10-CM | POA: Insufficient documentation

## 2017-12-06 DIAGNOSIS — M4726 Other spondylosis with radiculopathy, lumbar region: Secondary | ICD-10-CM | POA: Diagnosis not present

## 2017-12-06 DIAGNOSIS — M546 Pain in thoracic spine: Secondary | ICD-10-CM | POA: Insufficient documentation

## 2017-12-06 DIAGNOSIS — Z6824 Body mass index (BMI) 24.0-24.9, adult: Secondary | ICD-10-CM | POA: Insufficient documentation

## 2017-12-06 DIAGNOSIS — M7060 Trochanteric bursitis, unspecified hip: Secondary | ICD-10-CM | POA: Diagnosis not present

## 2017-12-06 DIAGNOSIS — M545 Low back pain: Secondary | ICD-10-CM | POA: Diagnosis present

## 2017-12-06 DIAGNOSIS — Z7982 Long term (current) use of aspirin: Secondary | ICD-10-CM | POA: Insufficient documentation

## 2017-12-06 DIAGNOSIS — E785 Hyperlipidemia, unspecified: Secondary | ICD-10-CM | POA: Insufficient documentation

## 2017-12-06 MED ORDER — OXYCODONE-ACETAMINOPHEN 10-325 MG PO TABS: 1.0000 | ORAL_TABLET | Freq: Four times a day (QID) | ORAL | 0 refills | Status: DC | PRN

## 2017-12-06 MED ORDER — GABAPENTIN 600 MG PO TABS: 600.0000 mg | ORAL_TABLET | Freq: Three times a day (TID) | ORAL | 0 refills | Status: DC

## 2017-12-06 NOTE — Patient Instructions (Signed)

## 2017-12-06 NOTE — Progress Notes (Addendum)
Patient's Name: Douglas Andrews  MRN: [id number]  Referring Provider: Fritzi Mandes, MD  DOB: [birth year]  PCP: Fritzi Mandes, MD  DOS: 12/06/2017  Note by: Vevelyn Francois NP  Service setting: Ambulatory outpatient  Specialty: Interventional Pain Management  Location: [facility] (AMB) Pain Management Facility    Patient type: Established    Primary Reason(s) for Visit: Encounter for prescription drug management & post-procedure evaluation of chronic illness with mild to moderate exacerbation(Level of risk: moderate) CC: Back Pain (lower back)  HPI  Douglas Andrews is a 67 y.o. year old, male patient, who comes today for a post-procedure evaluation and medication management. He has Chronic low back pain (Primary Source of Pain) (Bilateral) (L>R); Chronic pain syndrome; Lumbar DDD (degenerative disc disease); Lumbar Spondylosis; Lumbar radicular pain (Left) (L5 Dermatome); Lumbar facet syndrome (Bilateral) (R>L); Opiate use (60 MME/Day); Uncomplicated opioid dependence ([place]); Long term prescription opiate use; Chronic knee pain (Tertiary source of pain) (Bilateral) (Right); Vitamin D insufficiency; Overweight; Bell palsy; CN (constipation); Dermatitis, eczematoid; Diabetes mellitus ([place]); Dysmetria; GAD (generalized anxiety disorder); Insomnia due to medical condition; Long term current use of systemic steroids; Poorly controlled type 2 diabetes mellitus ([place]); Pulmonary sarcoidosis ([place]); Arthropathy, traumatic, shoulder; Bursitis, trochanteric; Varices, esophageal ([place]); Leg varices; Hepatic cirrhosis ([place]); Major depressive disorder, single episode; Other long term (current) drug therapy; At risk for falling; Encounter for chronic pain management; Encounter for therapeutic drug level monitoring; Chronic lumbar radicular pain (Left) (L5 Dermatome); Lumbar Levoscoliosis; Neurogenic pain; Osteoarthritis of knee (Right); At high risk for falls; Chest pain; Skin lesion; Pain of left femur (left femoral rod due to  sarcoidosis); Chronic hip pain (Right); Osteoarthritis of knee (Bilateral) (R>L); Essential hypertension; Folliculitis; Chronic sacroiliac joint pain (Left); Chronic hip pain (Left); Chronic thoracic back pain (Left); Diarrhea; Numbness on left side; Gastroenteritis; Other fatigue; Urinary incontinence without sensory awareness; Balanitis; Generalized abdominal pain; Hyperglycemic crisis in diabetes mellitus ([place]); Osteoarthritis of lumbar spine; Chronic lower extremity pain (Secondary Area of Pain) (Bilateral) (L>R); Retrolisthesis (2 mm) of L4 over L5; Anterolisthesis (7 mm) L5 over S1; Lumbar facet arthropathy (L4-5 and L5-S1) (Bilateral); Lumbar facet osteoarthritis (Bilateral); History of lacunar cerebrovascular accident; MCI (mild cognitive impairment); Acute postoperative pain; Right-sided lacunar infarction; and Spondylosis without myelopathy or radiculopathy, lumbosacral region on their problem list. His primarily concern today is the Back Pain (lower back)  Pain Assessment: Location: Lower Back Radiating: Denies Onset: More than a month ago Duration: Chronic pain Quality: Aching, Nagging, Constant Severity: 3 /10 (self-reported pain score)  Note: Reported level is compatible with observation.                          Effect on ADL:   Timing: Constant Modifying factors: ice or heat, meds  Douglas Andrews was last seen on 12/04/2017 for a procedure. During today's appointment we reviewed Douglas Andrews post-procedure results, as well as his outpatient medication regimen. He is seen by PCP in [place]. He states that his pain is very stable.   Further details on both, my assessment(s), as well as the proposed treatment plan, please see below.  Controlled Substance Pharmacotherapy Assessment REMS (Risk Evaluation and Mitigation Strategy)  Analgesic:Oxycodone/APAP 10/325 one every 6 hours (40 mg/day) MME/day:60 mg/day    Chauncey Fischer, RN  12/06/2017 10:51 AM  Signed Nursing Pain  Medication Assessment:  Safety precautions to be maintained throughout the outpatient stay will include: orient to surroundings, keep bed in low position, maintain call bell within  reach at all times, provide assistance with transfer out of bed and ambulation.  Medication Inspection Compliance: Douglas Andrews did not comply with our request to bring his pills to be counted. He was reminded that bringing the medication bottles, even when empty, is a requirement.  Medication: None brought in. Pill/Patch Count: None available to be counted. Bottle Appearance: No container available. Did not bring bottle(s) to appointment. Filled Date: N/A Last Medication intake:  Today  Medication in dispense in pill pack    Pharmacokinetics: Liberation and absorption (onset of action): WNL Distribution (time to peak effect): WNL Metabolism and excretion (duration of action): WNL         Pharmacodynamics: Desired effects: Analgesia: Douglas Andrews reports >50% benefit. Functional ability: Patient reports that medication allows him to accomplish basic ADLs Clinically meaningful improvement in function (CMIF): Sustained CMIF goals met Perceived effectiveness: Described as relatively effective, allowing for increase in activities of daily living (ADL) Undesirable effects: Side-effects or Adverse reactions: None reported Monitoring: [place] PMP: Online review of the past 32-monthperiod conducted. Compliant with practice rules and regulations Last UDS on record: Summary  Date Value Ref Range Status  12/06/2017 FINAL  Final    Comment:    ==================================================================== TOXASSURE SELECT 13 (MW) ==================================================================== Test                             Result       Flag       Units Drug Present and Declared for Prescription Verification   Noroxycodone                   773          EXPECTED   ng/mg creat    Noroxycodone is an expected  metabolite of oxycodone. Sources of    oxycodone include scheduled prescription medications. Drug Present not Declared for Prescription Verification   7-aminoclonazepam              63           UNEXPECTED ng/mg creat    7-aminoclonazepam is an expected metabolite of clonazepam. Source    of clonazepam is a scheduled prescription medication. Drug Absent but Declared for Prescription Verification   Oxycodone                      Not Detected UNEXPECTED ng/mg creat    Oxycodone is almost always present in patients taking this drug    consistently.  Absence of oxycodone could be due to lapse of time    since the last dose or unusual pharmacokinetics (rapid    metabolism). ==================================================================== Test                      Result    Flag   Units      Ref Range   Creatinine              41               mg/dL      >=20 ==================================================================== Declared Medications:  The flagging and interpretation on this report are based on the  following declared medications.  Unexpected results may arise from  inaccuracies in the declared medications.  **Note: The testing scope of this panel includes these medications:  Oxycodone (Percocet)  **Note: The testing scope of this panel does not include following  reported medications:  Acetaminophen (Percocet)  Albuterol  Aspirin (Aspirin 81)  Atorvastatin (Lipitor)  Cetirizine (Zyrtec)  Clotrimazole (Lotrimin)  Gabapentin (Neurontin)  Insulin (Humalog)  Insulin (Lantus)  Isosorbide (Imdur)  Liraglutide (Victoza)  Lisinopril  Metformin  Nystatin  Vitamin D3 ==================================================================== For clinical consultation, please call [phone]. ====================================================================    UDS interpretation: Unexpected findings: Today I reminded Douglas Andrews that accuracy during medication  reconciliation is of paramount importance. He admits that he uses APAP Pm and Temazpam occasionally given by PCP.  Medication Assessment Form: Reviewed. Patient indicates being compliant with therapy Treatment compliance: Compliant Risk Assessment Profile: Aberrant behavior: See prior evaluations. None observed or detected today Comorbid factors increasing risk of overdose: See prior notes. No additional risks detected today Risk of substance use disorder (SUD): Low  ORT Scoring interpretation table:  Score <3 = Low Risk for SUD  Score between 4-7 = Moderate Risk for SUD  Score >8 = High Risk for Opioid Abuse   Risk Mitigation Strategies:  Patient Counseling: Covered Patient-Prescriber Agreement (PPA): Present and active  Notification to other healthcare providers: Done  Pharmacologic Plan: No change in therapy, at this time.             Post-Procedure Assessment  09/14/17 Procedure: Right Lumbar Facet RFA Pre-procedure pain score:  4/10 Post-procedure pain score: 0/10         Influential Factors: BMI: 24.18 kg/m Intra-procedural challenges: None observed.         Assessment challenges: None detected.              Reported side-effects: None.        Post-procedural adverse reactions or complications: None reported         Sedation: Please see nurses note. When no sedatives are used, the analgesic levels obtained are directly associated to the effectiveness of the local anesthetics. However, when sedation is provided, the level of analgesia obtained during the initial 1 hour following the intervention, is believed to be the result of a combination of factors. These factors may include, but are not limited to: 1. The effectiveness of the local anesthetics used. 2. The effects of the analgesic(s) and/or anxiolytic(s) used. 3. The degree of discomfort experienced by the patient at the time of the procedure. 4. The patients ability and reliability in recalling and recording the  events. 5. The presence and influence of possible secondary gains and/or psychosocial factors. Reported result: Relief experienced during the 1st hour after the procedure: 100 % (Ultra-Short Term Relief)            Interpretative annotation: Clinically appropriate result. Analgesia during this period is likely to be Local Anesthetic and/or IV Sedative (Analgesic/Anxiolytic) related.          Effects of local anesthetic: The analgesic effects attained during this period are directly associated to the localized infiltration of local anesthetics and therefore cary significant diagnostic value as to the etiological location, or anatomical origin, of the pain. Expected duration of relief is directly dependent on the pharmacodynamics of the local anesthetic used. Long-acting (4-6 hours) anesthetics used.  Reported result: Relief during the next 4 to 6 hour after the procedure: 100 % (Short-Term Relief)            Interpretative annotation: Clinically appropriate result. Analgesia during this period is likely to be Local Anesthetic-related.          Long-term benefit: Defined as the period of time past the expected duration of local anesthetics (1 hour for short-acting and 4-6 hours for long-acting).  With the possible exception of prolonged sympathetic blockade from the local anesthetics, benefits during this period are typically attributed to, or associated with, other factors such as analgesic sensory neuropraxia, antiinflammatory effects, or beneficial biochemical changes provided by agents other than the local anesthetics.  Reported result: Extended relief following procedure: 80 % (Long-Term Relief)            Interpretative annotation: Clinically appropriate result. Good relief. No permanent benefit expected. Inflammation plays a part in the etiology to the pain.          Current benefits: Defined as reported results that persistent at this point in time.   Analgesia: >75 %            Function: Mr.  Andrews reports improvement in function ROM: Douglas Andrews reports improvement in ROM Interpretative annotation: Ongoing benefit. Therapeutic success. Adequate RF ablation.          Interpretation: Results would suggest adequate radiofrequency ablation.                  Plan:  Please see "Plan of Care" for details.                Laboratory Chemistry  Inflammation Markers (CRP: Acute Phase) (ESR: Chronic Phase) Lab Results  Component Value Date   CRP <0.5 03/30/2016   ESRSEDRATE 1 03/30/2016                         Rheumatology Markers No results found for: Elayne Guerin, [facility]              Renal Function Markers Lab Results  Component Value Date   BUN 12 03/30/2016   CREATININE 0.85 03/30/2016   GFRAA >60 03/30/2016   GFRNONAA >60 03/30/2016                 Hepatic Function Markers Lab Results  Component Value Date   AST 24 03/30/2016   ALT 27 03/30/2016   ALBUMIN 4.6 03/30/2016   ALKPHOS 63 03/30/2016                 Electrolytes Lab Results  Component Value Date   NA 136 03/30/2016   K 3.8 03/30/2016   CL 100 (L) 03/30/2016   CALCIUM 9.2 03/30/2016   MG 2.2 03/30/2016                        Neuropathy Markers Lab Results  Component Value Date   VITAMINB12 539 03/30/2016                 Bone Pathology Markers Lab Results  Component Value Date   25OHVITD1 38 03/30/2016   25OHVITD2 3.4 03/30/2016   25OHVITD3 35 03/30/2016                         Coagulation Parameters No results found for: INR, LABPROT, APTT, PLT, DDIMER               Cardiovascular Markers No results found for: BNP, CKTOTAL, CKMB, TROPONINI, HGB, HCT               CA Markers No results found for: CEA, CA125, LABCA2               Note: Lab results reviewed.  Recent Diagnostic Imaging Results  DG C-Arm 1-60 Min-No Report Fluoroscopy was utilized by the requesting  physician.  No radiographic  interpretation.   Complexity Note: Imaging results  reviewed. Results shared with Douglas Andrews, using Layman's terms.                         Meds   Current Outpatient Medications:  .  albuterol (PROAIR HFA) 108 (90 Base) MCG/ACT inhaler, INHALE 2 PUFFS BY MOUTH EVERY 4 HOURS ASNEEDED FOR WHEEZING, Disp: , Rfl:  .  aspirin EC 81 MG tablet, Take 81 mg by mouth., Disp: , Rfl:  .  atorvastatin (LIPITOR) 40 MG tablet, Take 40 mg by mouth daily., Disp: , Rfl:  .  cetirizine (ZYRTEC) 10 MG tablet, Take 10 mg by mouth daily. , Disp: , Rfl:  .  Cholecalciferol (VITAMIN D3) 5000 units TABS, Take 1 tablet by mouth daily. , Disp: , Rfl:  .  clotrimazole (LOTRIMIN) 1 % cream, 1 application as needed. , Disp: , Rfl:  .  gabapentin (NEURONTIN) 600 MG tablet, Take 1 tablet (600 mg total) by mouth every 8 (eight) hours., Disp: 270 tablet, Rfl: 0 .  glucose blood (ACCU-CHEK ACTIVE STRIPS) test strip, Checks 3 times daily, Disp: , Rfl:  .  insulin glargine (LANTUS) 100 UNIT/ML injection, Inject 60 Units into the skin at bedtime. , Disp: , Rfl:  .  insulin lispro (HUMALOG) 100 UNIT/ML injection, Inject 18 Units into the skin 3 (three) times daily with meals., Disp: , Rfl:  .  Insulin Pen Needle (UNIFINE PENTIPS) 31G X 6 MM MISC, , Disp: , Rfl:  .  Insulin Pen Needle (UNIFINE PENTIPS) 31G X 6 MM MISC, USE AS DIRECTED WITH LANTUS & NOVOLOG, Disp: , Rfl:  .  isosorbide mononitrate (IMDUR) 30 MG 24 hr tablet, 30 mg daily. , Disp: , Rfl:  .  lisinopril (PRINIVIL,ZESTRIL) 10 MG tablet, Take 10 mg by mouth daily. , Disp: , Rfl:  .  metFORMIN (GLUCOPHAGE) 500 MG tablet, Take 1,000 mg by mouth 2 (two) times daily with a meal., Disp: , Rfl:  .  nystatin (MYCOSTATIN/NYSTOP) powder, as needed. , Disp: , Rfl:  .  oxyCODONE-acetaminophen (PERCOCET) 10-325 MG tablet, Take 1 tablet by mouth every 6 (six) hours as needed for pain., Disp: 120 tablet, Rfl: 0 .  VICTOZA 18 MG/3ML SOPN, Inject into the skin 3 (three) times daily. , Disp: , Rfl:  .  albuterol (PROVENTIL) (5 MG/ML) 0.5%  nebulizer solution, Take 2.5 mg by nebulization every 6 (six) hours as needed for wheezing or shortness of breath. , Disp: , Rfl:  .  HYDROcodone-acetaminophen (NORCO/VICODIN) 5-325 MG tablet, Take 1 tablet by mouth every 6 (six) hours as needed for up to 7 days for moderate pain., Disp: 28 tablet, Rfl: 0 .  [START ON 02/08/2018] oxyCODONE-acetaminophen (PERCOCET) 10-325 MG tablet, Take 1 tablet by mouth every 6 (six) hours as needed for pain., Disp: 120 tablet, Rfl: 0 .  [START ON 01/09/2018] oxyCODONE-acetaminophen (PERCOCET) 10-325 MG tablet, Take 1 tablet by mouth every 6 (six) hours as needed for pain., Disp: 120 tablet, Rfl: 0  ROS  Constitutional: Denies any fever or chills Gastrointestinal: No reported hemesis, hematochezia, vomiting, or acute GI distress Musculoskeletal: Denies any acute onset joint swelling, redness, loss of ROM, or weakness Neurological: No reported episodes of acute onset apraxia, aphasia, dysarthria, agnosia, amnesia, paralysis, loss of coordination, or loss of consciousness  Allergies  Douglas Andrews is allergic to sertraline.  [place]  Drug: Douglas Andrews  reports that he does not use drugs. Alcohol:  reports that he does not drink alcohol. Tobacco:  reports that he has never smoked. He has never used smokeless tobacco. Medical:  has a past medical history of Chronic back pain, Diabetes mellitus without complication ([place]), Hyperlipidemia, Hypertension, Left leg pain (01/02/2014), Right leg pain (07/15/2015), and Sarcoidosis. Surgical: Mr. Danzer  has a past surgical history that includes Appendectomy; Tonsillectomy; and left rod. Family: family history includes Diabetes in his father; Heart disease in his father and mother; Stroke in his father.  Constitutional Exam  General appearance: Well nourished, well developed, and well hydrated. In no apparent acute distress Vitals:   12/06/17 0924 12/06/17 0930  BP:  (!) 155/91  Pulse: 79   Temp: 97.9 F (36.6 C)   SpO2: 99%    Weight: 159 lb (72.1 kg)   Height: '5\' 8"'$  (1.727 m)    BMI Assessment: Estimated body mass index is 24.18 kg/m as calculated from the following:   Height as of this encounter: '5\' 8"'$  (1.727 m).   Weight as of this encounter: 159 lb (72.1 kg). Psych/Mental status: Alert, oriented x 3 (person, place, & time)       Eyes: PERLA Respiratory: No evidence of acute respiratory distress  Cervical Spine Area Exam  Skin & Axial Inspection: No masses, redness, edema, swelling, or associated skin lesions Alignment: Symmetrical Functional ROM: Unrestricted ROM      Stability: No instability detected Muscle Tone/Strength: Functionally intact. No obvious neuro-muscular anomalies detected. Sensory (Neurological): Unimpaired Palpation: No palpable anomalies              Upper Extremity (UE) Exam    Side: Right upper extremity  Side: Left upper extremity  Skin & Extremity Inspection: Skin color, temperature, and hair growth are WNL. No peripheral edema or cyanosis. No masses, redness, swelling, asymmetry, or associated skin lesions. No contractures.  Skin & Extremity Inspection: Skin color, temperature, and hair growth are WNL. No peripheral edema or cyanosis. No masses, redness, swelling, asymmetry, or associated skin lesions. No contractures.  Functional ROM: Unrestricted ROM          Functional ROM: Unrestricted ROM          Muscle Tone/Strength: Functionally intact. No obvious neuro-muscular anomalies detected.  Muscle Tone/Strength: Functionally intact. No obvious neuro-muscular anomalies detected.  Sensory (Neurological): Unimpaired          Sensory (Neurological): Unimpaired          Palpation: No palpable anomalies              Palpation: No palpable anomalies              Specialized Test(s): Deferred         Specialized Test(s): Deferred          Thoracic Spine Area Exam  Skin & Axial Inspection: No masses, redness, or swelling Alignment: Symmetrical Functional ROM: Unrestricted  ROM Stability: No instability detected Muscle Tone/Strength: Functionally intact. No obvious neuro-muscular anomalies detected. Sensory (Neurological): Unimpaired Muscle strength & Tone: No palpable anomalies  Lumbar Spine Area Exam  Skin & Axial Inspection: No masses, redness, or swelling Alignment: Symmetrical Functional ROM: Unrestricted ROM      Stability: No instability detected Muscle Tone/Strength: Functionally intact. No obvious neuro-muscular anomalies detected. Sensory (Neurological): Unimpaired Palpation: Non-tender       Provocative Tests: Lumbar Hyperextension and rotation test: evaluation deferred today       Lumbar Lateral bending test: evaluation deferred today       Patrick's Maneuver: evaluation deferred today  Gait & Posture Assessment  Ambulation: Unassisted Gait: Relatively normal for age and body habitus Posture: WNL   Lower Extremity Exam    Side: Right lower extremity  Side: Left lower extremity  Skin & Extremity Inspection: Skin color, temperature, and hair growth are WNL. No peripheral edema or cyanosis. No masses, redness, swelling, asymmetry, or associated skin lesions. No contractures.  Skin & Extremity Inspection: Skin color, temperature, and hair growth are WNL. No peripheral edema or cyanosis. No masses, redness, swelling, asymmetry, or associated skin lesions. No contractures.  Functional ROM: Unrestricted ROM          Functional ROM: Unrestricted ROM          Muscle Tone/Strength: Functionally intact. No obvious neuro-muscular anomalies detected.  Muscle Tone/Strength: Functionally intact. No obvious neuro-muscular anomalies detected.  Sensory (Neurological): Unimpaired  Sensory (Neurological): Unimpaired  Palpation: No palpable anomalies  Palpation: No palpable anomalies   Assessment  Primary Diagnosis & Pertinent Problem List: The primary encounter diagnosis was Lumbar Spondylosis. Diagnoses of Lumbar DDD (degenerative disc  disease), Chronic hip pain (Right), Chronic pain syndrome, Neurogenic pain, and Long term prescription opiate use were also pertinent to this visit.  Status Diagnosis  Controlled Controlled Controlled 1. Lumbar Spondylosis   2. Lumbar DDD (degenerative disc disease)   3. Chronic hip pain (Right)   4. Chronic pain syndrome   5. Neurogenic pain   6. Long term prescription opiate use     Problems updated and reviewed during this visit: No problems updated. Plan of Care  Pharmacotherapy (Medications Ordered): Meds ordered this encounter  Medications  . oxyCODONE-acetaminophen (PERCOCET) 10-325 MG tablet    Sig: Take 1 tablet by mouth every 6 (six) hours as needed for pain.    Dispense:  120 tablet    Refill:  0    Do not place this medication, or any other prescription from our practice, on "Automatic Refill". Patient may have prescription filled one day early if pharmacy is closed on scheduled refill date. Do not fill until:02/08/2018 To last until: 03/10/2018    Order Specific Question:   Supervising Provider    Answer:   Milinda Pointer [phone]  . gabapentin (NEURONTIN) 600 MG tablet    Sig: Take 1 tablet (600 mg total) by mouth every 8 (eight) hours.    Dispense:  270 tablet    Refill:  0    Do not place this medication, or any other prescription from our practice, on "Automatic Refill". Patient may have prescription filled one day early if pharmacy is closed on scheduled refill date.    Order Specific Question:   Supervising Provider    Answer:   Milinda Pointer [phone]  . oxyCODONE-acetaminophen (PERCOCET) 10-325 MG tablet    Sig: Take 1 tablet by mouth every 6 (six) hours as needed for pain.    Dispense:  120 tablet    Refill:  0    Do not place this medication, or any other prescription from our practice, on "Automatic Refill". Patient may have prescription filled one day early if pharmacy is closed on scheduled refill date. Do not fill until: 01/09/2018 To last  until:02/08/2018    Order Specific Question:   Supervising Provider    Answer:   Milinda Pointer [phone]  . oxyCODONE-acetaminophen (PERCOCET) 10-325 MG tablet    Sig: Take 1 tablet by mouth every 6 (six) hours as needed for pain.    Dispense:  120 tablet    Refill:  0    Do  not place this medication, or any other prescription from our practice, on "Automatic Refill". Patient may have prescription filled one day early if pharmacy is closed on scheduled refill date. Do not fill until: 12/10/2017 To last until:01/09/2018    Order Specific Question:   Supervising Provider    Answer:   Milinda Pointer [phone]   New Prescriptions   No medications on file   Medications administered today: Capone Schwinn had no medications administered during this visit. Lab-work, procedure(s), and/or referral(s): Orders Placed This Encounter  Procedures  . ToxASSURE Select 13 (MW), Urine   Imaging and/or referral(s): None  Interventional management options: Planned, scheduled, and/or pending: Right Lumbar Facet RFA   Considering: Diagnostic bilateral Lumbar facet block Possible bilateral lumbar facet RFA (left side first)  Diagnostic right intra-articular hip injection Possible radiofrequency of the right hipjoint Diagnostic Right L4-5 lumbar epidural steroid injection Diagnostic bilateral intra-articular knee injection Possible series of 5 Hyalgan knee injections.  Diagnostic bilateral Genicular nerve block Possible bilateral Genicular nerve radiofrequencyablation   Palliative PRN treatment(s): Diagnostic bilateral Lumbar facet block #2 Diagnostic right intra-articular hip injection Diagnostic Right L4-5 lumbar epidural steroid injection Diagnostic bilateral intra-articular knee injection Possible series of 5 Hyalgan knee injections.  Diagnostic bilateral Genicular nerve block   Provider-requested follow-up: Return in about 4 weeks (around 01/03/2018) for MedMgmt with Me  Donella Stade Edison Pace).  Future Appointments  Date Time Provider [place]  02/08/2018  1:45 PM Vevelyn Francois, NP [facility] None   Primary Care Physician: Fritzi Mandes, MD Location: [facility] Note by: Vevelyn Francois NP Date: 12/06/2017; Time: 8:40 AM  Pain Score Disclaimer: We use the NRS-11 scale. This is a self-reported, subjective measurement of pain severity with only modest accuracy. It is used primarily to identify changes within a particular patient. It must be understood that outpatient pain scales are significantly less accurate that those used for research, where they can be applied under ideal controlled circumstances with minimal exposure to variables. In reality, the score is likely to be a combination of pain intensity and pain affect, where pain affect describes the degree of emotional arousal or changes in action readiness caused by the sensory experience of pain. Factors such as social and work situation, setting, emotional state, anxiety levels, expectation, and prior pain experience may influence pain perception and show large inter-individual differences that may also be affected by time variables.  Patient instructions provided during this appointment: Patient Instructions  ____________________________________________________________________________________________  Medication Rules  Applies to: All patients receiving prescriptions (written or electronic).  Pharmacy of record: Pharmacy where electronic prescriptions will be sent. If written prescriptions are taken to a different pharmacy, please inform the nursing staff. The pharmacy listed in the electronic medical record should be the one where you would like electronic prescriptions to be sent.  Prescription refills: Only during scheduled appointments. Applies to both, written and electronic prescriptions.  NOTE: The following applies primarily to controlled substances (Opioid*  Pain Medications).   Patient's responsibilities: 1. Pain Pills: Bring all pain pills to every appointment (except for procedure appointments). 2. Pill Bottles: Bring pills in original pharmacy bottle. Always bring newest bottle. Bring bottle, even if empty. 3. Medication refills: You are responsible for knowing and keeping track of what medications you need refilled. The day before your appointment, write a list of all prescriptions that need to be refilled. Bring that list to your appointment and give it to the admitting nurse. Prescriptions will be written only during appointments.  If you forget a medication, it will not be "Called in", "Faxed", or "electronically sent". You will need to get another appointment to get these prescribed. 4. Prescription Accuracy: You are responsible for carefully inspecting your prescriptions before leaving our office. Have the discharge nurse carefully go over each prescription with you, before taking them home. Make sure that your name is accurately spelled, that your address is correct. Check the name and dose of your medication to make sure it is accurate. Check the number of pills, and the written instructions to make sure they are clear and accurate. Make sure that you are given enough medication to last until your next medication refill appointment. 5. Taking Medication: Take medication as prescribed. Never take more pills than instructed. Never take medication more frequently than prescribed. Taking less pills or less frequently is permitted and encouraged, when it comes to controlled substances (written prescriptions).  6. Inform other Doctors: Always inform, all of your healthcare providers, of all the medications you take. 7. Pain Medication from other Providers: You are not allowed to accept any additional pain medication from any other Doctor or Healthcare provider. There are two exceptions to this rule. (see below) In the event that you require additional pain  medication, you are responsible for notifying us, as stated below. 8. Medication Agreement: You are responsible for carefully reading and following our Medication Agreement. This must be signed before receiving any prescriptions from our practice. Safely store a copy of your signed Agreement. Violations to the Agreement will result in no further prescriptions. (Additional copies of our Medication Agreement are available upon request.) 9. Laws, Rules, & Regulations: All patients are expected to follow all Federal and [employer], [employer], Rules, [employer]. Ignorance of the Laws does not constitute a valid excuse. The use of any illegal substances is prohibited. 10. Adopted CDC guidelines & recommendations: Target dosing levels will be at or below 60 MME/day. Use of benzodiazepines** is not recommended.  Exceptions: There are only two exceptions to the rule of not receiving pain medications from other Healthcare Providers. 1. Exception #1 (Emergencies): In the event of an emergency (i.e.: accident requiring emergency care), you are allowed to receive additional pain medication. However, you are responsible for: As soon as you are able, call our office [phone], at any time of the day or night, and leave a message stating your name, the date and nature of the emergency, and the name and dose of the medication prescribed. In the event that your call is answered by a member of our staff, make sure to document and save the date, time, and the name of the person that took your information.  2. Exception #2 (Planned Surgery): In the event that you are scheduled by another doctor or dentist to have any type of surgery or procedure, you are allowed (for a period no longer than 30 days), to receive additional pain medication, for the acute post-op pain. However, in this case, you are responsible for picking up a copy of our "Post-op Pain Management for Surgeons" handout, and giving it to your surgeon or  dentist. This document is available at our office, and does not require an appointment to obtain it. Simply go to our office during business hours (Monday-Thursday from 8:00 AM to 4:00 PM) (Friday 8:00 AM to 12:00 Noon) or if you have a scheduled appointment with [place], prior to your surgery, and ask for it by name. In addition, you will need to provide [place] with your  name, name of your surgeon, type of surgery, and date of procedure or surgery.  *Opioid medications include: morphine, codeine, oxycodone, oxymorphone, hydrocodone, hydromorphone, meperidine, tramadol, tapentadol, buprenorphine, fentanyl, methadone. **Benzodiazepine medications include: diazepam (Valium), alprazolam (Xanax), clonazepam (Klonopine), lorazepam (Ativan), clorazepate (Tranxene), chlordiazepoxide (Librium), estazolam (Prosom), oxazepam (Serax), temazepam (Restoril), triazolam (Halcion) ____________________________________________________________________________________________

## 2017-12-06 NOTE — Progress Notes (Signed)
Nursing Pain Medication Assessment:  Safety precautions to be maintained throughout the outpatient stay will include: orient to surroundings, keep bed in low position, maintain call bell within reach at all times, provide assistance with transfer out of bed and ambulation.  Medication Inspection Compliance: Mr. Mozingo did not comply with our request to bring his pills to be counted. He was reminded that bringing the medication bottles, even when empty, is a requirement.  Medication: None brought in. Pill/Patch Count: None available to be counted. Bottle Appearance: No container available. Did not bring bottle(s) to appointment. Filled Date: N/A Last Medication intake:  Today 

## 2017-12-11 LAB — TOXASSURE SELECT 13 (MW), URINE

## 2017-12-28 ENCOUNTER — Ambulatory Visit
Admission: RE | Admit: 2017-12-28 | Discharge: 2017-12-28 | Disposition: A | Discharge status: Home / Self Care | Payer: Medicare Other | Source: Ambulatory Visit | Attending: Pain Medicine | Admitting: Pain Medicine

## 2017-12-28 ENCOUNTER — Other Ambulatory Visit: Payer: Self-pay

## 2017-12-28 ENCOUNTER — Encounter: Payer: Self-pay | Admitting: Pain Medicine

## 2017-12-28 ENCOUNTER — Ambulatory Visit (HOSPITAL_BASED_OUTPATIENT_CLINIC_OR_DEPARTMENT_OTHER): Payer: Medicare Other | Admitting: Pain Medicine

## 2017-12-28 VITALS — BP 123/87 | HR 70 | Temp 98.1°F | Resp 18 | Ht 68.0 in | Wt 153.0 lb

## 2017-12-28 DIAGNOSIS — M47817 Spondylosis without myelopathy or radiculopathy, lumbosacral region: Secondary | ICD-10-CM | POA: Diagnosis not present

## 2017-12-28 DIAGNOSIS — M47816 Spondylosis without myelopathy or radiculopathy, lumbar region: Secondary | ICD-10-CM

## 2017-12-28 DIAGNOSIS — G8918 Other acute postprocedural pain: Secondary | ICD-10-CM

## 2017-12-28 DIAGNOSIS — G8929 Other chronic pain: Secondary | ICD-10-CM

## 2017-12-28 DIAGNOSIS — M5441 Lumbago with sciatica, right side: Secondary | ICD-10-CM

## 2017-12-28 MED ORDER — TRIAMCINOLONE ACETONIDE 40 MG/ML IJ SUSP
40.0000 mg | Freq: Once | INTRAMUSCULAR | Status: AC
  Administered 2017-12-28: 40 mg
  Filled 2017-12-28: qty 1

## 2017-12-28 MED ORDER — HYDROCODONE-ACETAMINOPHEN 5-325 MG PO TABS
1.0000 | ORAL_TABLET | Freq: Four times a day (QID) | ORAL | 0 refills | Status: AC | PRN
Start: 2017-12-28 — End: 2018-01-04

## 2017-12-28 MED ORDER — MIDAZOLAM HCL 5 MG/5ML IJ SOLN
1.0000 mg | INTRAMUSCULAR | Status: DC | PRN
  Administered 2017-12-28: 3 mg via INTRAVENOUS
  Administered 2017-12-28: 2 mg via INTRAVENOUS
  Filled 2017-12-28: qty 5

## 2017-12-28 MED ORDER — LIDOCAINE HCL 2 % IJ SOLN
20.0000 mL | Freq: Once | INTRAMUSCULAR | Status: AC
  Administered 2017-12-28: 400 mg
  Filled 2017-12-28: qty 40

## 2017-12-28 MED ORDER — LACTATED RINGERS IV SOLN
1000.0000 mL | Freq: Once | INTRAVENOUS | Status: AC
  Administered 2017-12-28: 1000 mL via INTRAVENOUS

## 2017-12-28 MED ORDER — FENTANYL CITRATE (PF) 100 MCG/2ML IJ SOLN
25.0000 ug | INTRAMUSCULAR | Status: DC | PRN
  Administered 2017-12-28: 100 ug via INTRAVENOUS
  Filled 2017-12-28: qty 2

## 2017-12-28 MED ORDER — ROPIVACAINE HCL 2 MG/ML IJ SOLN
9.0000 mL | Freq: Once | INTRAMUSCULAR | Status: AC
  Administered 2017-12-28: 10 mL via PERINEURAL
  Filled 2017-12-28: qty 10

## 2017-12-28 NOTE — Progress Notes (Signed)
Patient's Name: Douglas Andrews  MRN: [id number]  Referring Provider: Katharine Look, MD  DOB: 01/11/1951  PCP: Katharine Look, MD  DOS: 12/28/2017  Note by: Oswaldo Done, MD  Service setting: Ambulatory outpatient  Specialty: Interventional Pain Management  Patient type: Established  Location: [facility] (AMB) Pain Management Facility  Visit type: Interventional Procedure   Primary Reason for Visit: Interventional Pain Management Treatment. CC: Back Pain (low)  Procedure:       Anesthesia, Analgesia, Anxiolysis:  Type: Thermal Lumbar Facet, Medial Branch Radiofrequency Neurotomy Level: L2, L3, L4, L5, & S1 Medial Branch Level(s). These levels will denervate the L3-4, L4-5, and the L5-S1 lumbar facet joints. Primary Purpose: Therapeutic Region: Posterolateral Lumbosacral Spine Laterality: Left  Type: Moderate (Conscious) Sedation combined with Local Anesthesia Indication(s): Analgesia and Anxiety Route: Intravenous (IV) IV Access: Secured Sedation: Meaningful verbal contact was maintained at all times during the procedure  Local Anesthetic: Lidocaine 1-2%   Indications: 1. Spondylosis without myelopathy or radiculopathy, lumbosacral region   2. Lumbar facet syndrome (Bilateral) (R>L)   3. Lumbar facet osteoarthritis (Bilateral)   4. Lumbar facet arthropathy (L4-5 and L5-S1) (Bilateral)   5. Chronic low back pain (Primary Source of Pain) (Bilateral) (L>R)    Douglas Andrews has been dealing with the above chronic pain for longer than three months and has either failed to respond, was unable to tolerate, or simply did not get enough benefit from other more conservative therapies including, but not limited to: 1. Over-the-counter medications 2. Anti-inflammatory medications 3. Muscle relaxants 4. Membrane stabilizers 5. Opioids 6. Physical therapy 7. Modalities (Heat, ice, etc.) 8. Invasive techniques such as nerve blocks. Douglas Andrews has attained more than 50% relief of the pain from  a series of diagnostic injections conducted in separate occasions.  Pain Score: Pre-procedure: 6 /10 Post-procedure: 0-No pain/10  Pre-op Assessment:  Douglas Andrews is a 67 y.o. (year old), male patient, seen today for interventional treatment. He  has a past surgical history that includes Appendectomy; Tonsillectomy; and left rod. Douglas Andrews has a current medication list which includes the following prescription(s): albuterol, aspirin ec, atorvastatin, cetirizine, vitamin d3, clotrimazole, gabapentin, glucose blood, insulin glargine, insulin lispro, unifine pentips, insulin pen needle, isosorbide mononitrate, lisinopril, metformin, nystatin, oxycodone-acetaminophen, oxycodone-acetaminophen, oxycodone-acetaminophen, victoza, albuterol, and hydrocodone-acetaminophen, and the following Facility-Administered Medications: fentanyl and midazolam. His primarily concern today is the Back Pain (low)  Initial Vital Signs:  Pulse Rate: 70 Temp: 97.7 F (36.5 C) Resp: 16 BP: (!) 143/79 SpO2: 100 %  BMI: Estimated body mass index is 23.26 kg/m as calculated from the following:   Height as of this encounter: 5\' 8"  (1.727 m).   Weight as of this encounter: 153 lb (69.4 kg).  Risk Assessment: Allergies: Reviewed. He is allergic to sertraline.  Allergy Precautions: None required Coagulopathies: Reviewed. None identified.  Blood-thinner therapy: None at this time Active Infection(s): Reviewed. None identified. Douglas Andrews is afebrile  Site Confirmation: Douglas Andrews was asked to confirm the procedure and laterality before marking the site Procedure checklist: Completed Consent: Before the procedure and under the influence of no sedative(s), amnesic(s), or anxiolytics, the patient was informed of the treatment options, risks and possible complications. To fulfill our ethical and legal obligations, as recommended by the American Medical Association's Code of Ethics, I have informed the patient of my clinical  impression; the nature and purpose of the treatment or procedure; the risks, benefits, and possible complications of the intervention; the alternatives, including doing nothing; the risk(s) and  benefit(s) of the alternative treatment(s) or procedure(s); and the risk(s) and benefit(s) of doing nothing. The patient was provided information about the general risks and possible complications associated with the procedure. These may include, but are not limited to: failure to achieve desired goals, infection, bleeding, organ or nerve damage, allergic reactions, paralysis, and death. In addition, the patient was informed of those risks and complications associated to Spine-related procedures, such as failure to decrease pain; infection (i.e.: Meningitis, epidural or intraspinal abscess); bleeding (i.e.: epidural hematoma, subarachnoid hemorrhage, or any other type of intraspinal or peri-dural bleeding); organ or nerve damage (i.e.: Any type of peripheral nerve, nerve root, or spinal cord injury) with subsequent damage to sensory, motor, and/or autonomic systems, resulting in permanent pain, numbness, and/or weakness of one or several areas of the body; allergic reactions; (i.e.: anaphylactic reaction); and/or death. Furthermore, the patient was informed of those risks and complications associated with the medications. These include, but are not limited to: allergic reactions (i.e.: anaphylactic or anaphylactoid reaction(s)); adrenal axis suppression; blood sugar elevation that in diabetics may result in ketoacidosis or comma; water retention that in patients with history of congestive heart failure may result in shortness of breath, pulmonary edema, and decompensation with resultant heart failure; weight gain; swelling or edema; medication-induced neural toxicity; particulate matter embolism and blood vessel occlusion with resultant organ, and/or nervous system infarction; and/or aseptic necrosis of one or more  joints. Finally, the patient was informed that Medicine is not an exact science; therefore, there is also the possibility of unforeseen or unpredictable risks and/or possible complications that may result in a catastrophic outcome. The patient indicated having understood very clearly. We have given the patient no guarantees and we have made no promises. Enough time was given to the patient to ask questions, all of which were answered to the patient's satisfaction. Douglas Andrews has indicated that he wanted to continue with the procedure. Attestation: I, the ordering provider, attest that I have discussed with the patient the benefits, risks, side-effects, alternatives, likelihood of achieving goals, and potential problems during recovery for the procedure that I have provided informed consent. Date  Time: 12/28/2017  1:50 PM  Pre-Procedure Preparation:  Monitoring: As per clinic protocol. Respiration, ETCO2, SpO2, BP, heart rate and rhythm monitor placed and checked for adequate function Safety Precautions: Patient was assessed for positional comfort and pressure points before starting the procedure. Time-out: I initiated and conducted the "Time-out" before starting the procedure, as per protocol. The patient was asked to participate by confirming the accuracy of the "Time Out" information. Verification of the correct person, site, and procedure were performed and confirmed by me, the nursing staff, and the patient. "Time-out" conducted as per Joint Commission's Universal Protocol (UP.01.01.01). Time: 1431  Description of Procedure:       Position: Prone Laterality: Left Levels:  L2, L3, L4, L5, & S1 Medial Branch Level(s), at the L3-4, L4-5, and the L5-S1 lumbar facet joints. Area Prepped: Lumbosacral Prepping solution: ChloraPrep (2% chlorhexidine gluconate and 70% isopropyl alcohol) Safety Precautions: Aspiration looking for blood return was conducted prior to all injections. At no point did we  inject any substances, as a needle was being advanced. Before injecting, the patient was told to immediately notify me if he was experiencing any new onset of "ringing in the ears, or metallic taste in the mouth". No attempts were made at seeking any paresthesias. Safe injection practices and needle disposal techniques used. Medications properly checked for expiration dates. SDV (single dose vial)  medications used. After the completion of the procedure, all disposable equipment used was discarded in the proper designated medical waste containers. Local Anesthesia: Protocol guidelines were followed. The patient was positioned over the fluoroscopy table. The area was prepped in the usual manner. The time-out was completed. The target area was identified using fluoroscopy. A 12-in long, straight, sterile hemostat was used with fluoroscopic guidance to locate the targets for each level blocked. Once located, the skin was marked with an approved surgical skin marker. Once all sites were marked, the skin (epidermis, dermis, and hypodermis), as well as deeper tissues (fat, connective tissue and muscle) were infiltrated with a small amount of a short-acting local anesthetic, loaded on a 10cc syringe with a 25G, 1.5-in  Needle. An appropriate amount of time was allowed for local anesthetics to take effect before proceeding to the next step. Local Anesthetic: Lidocaine 2.0% The unused portion of the local anesthetic was discarded in the proper designated containers. Technical explanation of process:  Radiofrequency Ablation (RFA) L2 Medial Branch Nerve RFA: The target area for the L2 medial branch is at the junction of the postero-lateral aspect of the superior articular process and the superior, posterior, and medial edge of the transverse process of L3. Under fluoroscopic guidance, a Radiofrequency needle was inserted until contact was made with os over the superior postero-lateral aspect of the pedicular shadow  (target area). Sensory and motor testing was conducted to properly adjust the position of the needle. Once satisfactory placement of the needle was achieved, the numbing solution was slowly injected after negative aspiration for blood. 2.0 mL of the nerve block solution was injected without difficulty or complication. After waiting for at least 3 minutes, the ablation was performed. Once completed, the needle was removed intact. L3 Medial Branch Nerve RFA: The target area for the L3 medial branch is at the junction of the postero-lateral aspect of the superior articular process and the superior, posterior, and medial edge of the transverse process of L4. Under fluoroscopic guidance, a Radiofrequency needle was inserted until contact was made with os over the superior postero-lateral aspect of the pedicular shadow (target area). Sensory and motor testing was conducted to properly adjust the position of the needle. Once satisfactory placement of the needle was achieved, the numbing solution was slowly injected after negative aspiration for blood. 2.0 mL of the nerve block solution was injected without difficulty or complication. After waiting for at least 3 minutes, the ablation was performed. Once completed, the needle was removed intact. L4 Medial Branch Nerve RFA: The target area for the L4 medial branch is at the junction of the postero-lateral aspect of the superior articular process and the superior, posterior, and medial edge of the transverse process of L5. Under fluoroscopic guidance, a Radiofrequency needle was inserted until contact was made with os over the superior postero-lateral aspect of the pedicular shadow (target area). Sensory and motor testing was conducted to properly adjust the position of the needle. Once satisfactory placement of the needle was achieved, the numbing solution was slowly injected after negative aspiration for blood. 2.0 mL of the nerve block solution was injected without  difficulty or complication. After waiting for at least 3 minutes, the ablation was performed. Once completed, the needle was removed intact. L5 Medial Branch Nerve RFA: The target area for the L5 medial branch is at the junction of the postero-lateral aspect of the superior articular process of S1 and the superior, posterior, and medial edge of the sacral ala. Under fluoroscopic  guidance, a Radiofrequency needle was inserted until contact was made with os over the superior postero-lateral aspect of the pedicular shadow (target area). Sensory and motor testing was conducted to properly adjust the position of the needle. Once satisfactory placement of the needle was achieved, the numbing solution was slowly injected after negative aspiration for blood. 2.0 mL of the nerve block solution was injected without difficulty or complication. After waiting for at least 3 minutes, the ablation was performed. Once completed, the needle was removed intact. S1 Medial Branch Nerve RFA: The target area for the S1 medial branch is located inferior to the junction of the S1 superior articular process and the L5 inferior articular process, posterior, inferior, and lateral to the 6 o'clock position of the L5-S1 facet joint, just superior to the S1 posterior foramen. Under fluoroscopic guidance, the Radiofrequency needle was advanced until contact was made with os over the Target area. Sensory and motor testing was conducted to properly adjust the position of the needle. Once satisfactory placement of the needle was achieved, the numbing solution was slowly injected after negative aspiration for blood. 2.0 mL of the nerve block solution was injected without difficulty or complication. After waiting for at least 3 minutes, the ablation was performed. Once completed, the needle was removed intact. Radiofrequency lesioning (ablation):  Radiofrequency Generator: NeuroTherm [id number] Sensory Stimulation Parameters: 50 Hz was used to locate  & identify the nerve, making sure that the needle was positioned such that there was no sensory stimulation below 0.3 V or above 0.7 V. Motor Stimulation Parameters: 2 Hz was used to evaluate the motor component. Care was taken not to lesion any nerves that demonstrated motor stimulation of the lower extremities at an output of less than 2.5 times that of the sensory threshold, or a maximum of 2.0 V. Lesioning Technique Parameters: Standard Radiofrequency settings. (Not bipolar or pulsed.) Temperature Settings: 80 degrees C Lesioning time: 60 seconds Intra-operative Compliance: Compliant Materials & Medications: Needle(s) (Electrode/Cannula) Type: Teflon-coated, curved tip, Radiofrequency needle(s) Gauge: 22G Length: 10cm Numbing solution: 0.2% PF-Ropivacaine + Triamcinolone (40 mg/mL) diluted to a final concentration of 4 mg of Triamcinolone/mL of Ropivacaine The unused portion of the solution was discarded in the proper designated containers.  Once the entire procedure was completed, the treated area was cleaned, making sure to leave some of the prepping solution back to take advantage of its long term bactericidal properties.  Illustration of the posterior view of the lumbar spine and the posterior neural structures. Laminae of L2 through S1 are labeled. DPRL5, dorsal primary ramus of L5; DPRS1, dorsal primary ramus of S1; DPR3, dorsal primary ramus of L3; FJ, facet (zygapophyseal) joint L3-L4; I, inferior articular process of L4; LB1, lateral branch of dorsal primary ramus of L1; IAB, inferior articular branches from L3 medial branch (supplies L4-L5 facet joint); IBP, intermediate branch plexus; MB3, medial branch of dorsal primary ramus of L3; NR3, third lumbar nerve root; S, superior articular process of L5; SAB, superior articular branches from L4 (supplies L4-5 facet joint also); TP3, transverse process of L3.  Vitals:   12/28/17 1510 12/28/17 1517 12/28/17 1527 12/28/17 1535  BP: (!)  144/83 (!) 126/95 138/81 123/87  Pulse:      Resp: 15 17 20 18   Temp:  98.1 F (36.7 C)    TempSrc:  Temporal    SpO2: 97% 98% 96% 97%  Weight:      Height:        Start Time: 1431 hrs. End Time: 1509  hrs.  Imaging Guidance (Spinal):  Type of Imaging Technique: Fluoroscopy Guidance (Spinal) Indication(s): Assistance in needle guidance and placement for procedures requiring needle placement in or near specific anatomical locations not easily accessible without such assistance. Exposure Time: Please see nurses notes. Contrast: None used. Fluoroscopic Guidance: I was personally present during the use of fluoroscopy. "Tunnel Vision Technique" used to obtain the best possible view of the target area. Parallax error corrected before commencing the procedure. "Direction-depth-direction" technique used to introduce the needle under continuous pulsed fluoroscopy. Once target was reached, antero-posterior, oblique, and lateral fluoroscopic projection used confirm needle placement in all planes. Images permanently stored in EMR. Interpretation: No contrast injected. I personally interpreted the imaging intraoperatively. Adequate needle placement confirmed in multiple planes. Permanent images saved into the patient's record.  Antibiotic Prophylaxis:   Anti-infectives (From admission, onward)   None     Indication(s): None identified  Post-operative Assessment:  Post-procedure Vital Signs:  Pulse Rate: 70 Temp: 98.1 F (36.7 C) Resp: 18 BP: 123/87 SpO2: 97 %  EBL: None  Complications: No immediate post-treatment complications observed by team, or reported by patient.  Note: The patient tolerated the entire procedure well. A repeat set of vitals were taken after the procedure and the patient was kept under observation following institutional policy, for this type of procedure. Post-procedural neurological assessment was performed, showing return to baseline, prior to discharge. The  patient was provided with post-procedure discharge instructions, including a section on how to identify potential problems. Should any problems arise concerning this procedure, the patient was given instructions to immediately contact us, at any time, without hesitation. In any case, we plan to contact the patient by telephone for a follow-up status report regarding this interventional procedure.  Comments:  No additional relevant information.  Plan of Care    Imaging Orders     DG C-Arm 1-60 Min-No Report  Procedure Orders     Radiofrequency,Lumbar  Medications ordered for procedure: Meds ordered this encounter  Medications  . lidocaine (XYLOCAINE) 2 % (with pres) injection 400 mg  . midazolam (VERSED) 5 MG/5ML injection 1-2 mg    Make sure Flumazenil is available in the pyxis when using this medication. If oversedation occurs, administer 0.2 mg IV over 15 sec. If after 45 sec no response, administer 0.2 mg again over 1 min; may repeat at 1 min intervals; not to exceed 4 doses (1 mg)  . fentaNYL (SUBLIMAZE) injection 25-50 mcg    Make sure Narcan is available in the pyxis when using this medication. In the event of respiratory depression (RR< 8/min): Titrate NARCAN (naloxone) in increments of 0.1 to 0.2 mg IV at 2-3 minute intervals, until desired degree of reversal.  . lactated ringers infusion 1,000 mL  . ropivacaine (PF) 2 mg/mL (0.2%) (NAROPIN) injection 9 mL  . triamcinolone acetonide (KENALOG-40) injection 40 mg  . HYDROcodone-acetaminophen (NORCO/VICODIN) 5-325 MG tablet    Sig: Take 1 tablet by mouth every 6 (six) hours as needed for up to 7 days for moderate pain.    Dispense:  28 tablet    Refill:  0    For acute post-operative pain. Not to be refilled. To last 7 days.   Medications administered: We administered lidocaine, midazolam, fentaNYL, lactated ringers, ropivacaine (PF) 2 mg/mL (0.2%), and triamcinolone acetonide.  See the medical record for exact dosing, route,  and time of administration.  New Prescriptions   HYDROCODONE-ACETAMINOPHEN (NORCO/VICODIN) 5-325 MG TABLET    Take 1 tablet by mouth every 6 (  six) hours as needed for up to 7 days for moderate pain.   Disposition: Discharge home  Discharge Date & Time: 12/28/2017; 1537 hrs.   Physician-requested Follow-up: Return for Post-RFA eval in 6 wks, w/ Thad Ranger, NP.  Future Appointments  Date Time Provider Department Center  01/03/2018  8:30 AM Barbette Merino, NP [facility] None  02/08/2018  1:45 PM Barbette Merino, NP [facility] None   Primary Care Physician: Katharine Look, MD Location: [facility] Note by: Oswaldo Done, MD Date: 12/28/2017; Time: 4:26 PM  Disclaimer:  Medicine is not an Visual merchandiser. The only guarantee in medicine is that nothing is guaranteed. It is important to note that the decision to proceed with this intervention was based on the information collected from the patient. The Data and conclusions were drawn from the patient's questionnaire, the interview, and the physical examination. Because the information was provided in large part by the patient, it cannot be guaranteed that it has not been purposely or unconsciously manipulated. Every effort has been made to obtain as much relevant data as possible for this evaluation. It is important to note that the conclusions that lead to this procedure are derived in large part from the available data. Always take into account that the treatment will also be dependent on availability of resources and existing treatment guidelines, considered by other Pain Management Practitioners as being common knowledge and practice, at the time of the intervention. For Medico-Legal purposes, it is also important to point out that variation in procedural techniques and pharmacological choices are the acceptable norm. The indications, contraindications, technique, and results of the above procedure should only be  interpreted and judged by a Board-Certified Interventional Pain Specialist with extensive familiarity and expertise in the same exact procedure and technique.

## 2017-12-28 NOTE — Patient Instructions (Signed)

## 2017-12-28 NOTE — Progress Notes (Signed)
Safety precautions to be maintained throughout the outpatient stay will include: orient to surroundings, keep bed in low position, maintain call bell within reach at all times, provide assistance with transfer out of bed and ambulation.  

## 2017-12-29 ENCOUNTER — Telehealth: Payer: Self-pay | Admitting: *Deleted

## 2017-12-29 NOTE — Telephone Encounter (Signed)
Voicemail left with patient to call our office if there are questions or concerns re; procedure on yesterday.  

## 2018-01-02 DIAGNOSIS — G479 Sleep disorder, unspecified: Secondary | ICD-10-CM | POA: Insufficient documentation

## 2018-01-03 ENCOUNTER — Encounter: Payer: Medicare Other | Admitting: Nurse Practitioner

## 2018-02-01 DIAGNOSIS — L03113 Cellulitis of right upper limb: Secondary | ICD-10-CM | POA: Insufficient documentation

## 2018-02-08 ENCOUNTER — Encounter: Payer: Self-pay | Admitting: Nurse Practitioner

## 2018-02-08 ENCOUNTER — Ambulatory Visit: Payer: Medicare Other | Attending: Nurse Practitioner | Admitting: Nurse Practitioner

## 2018-02-08 ENCOUNTER — Other Ambulatory Visit: Payer: Self-pay

## 2018-02-08 ENCOUNTER — Ambulatory Visit: Payer: Medicare Other | Admitting: Nurse Practitioner

## 2018-02-08 VITALS — BP 151/98 | HR 91 | Temp 98.1°F | Resp 16 | Ht 68.5 in | Wt 161.0 lb

## 2018-02-08 DIAGNOSIS — Z833 Family history of diabetes mellitus: Secondary | ICD-10-CM | POA: Diagnosis not present

## 2018-02-08 DIAGNOSIS — G479 Sleep disorder, unspecified: Secondary | ICD-10-CM | POA: Diagnosis not present

## 2018-02-08 DIAGNOSIS — Z5181 Encounter for therapeutic drug level monitoring: Secondary | ICD-10-CM | POA: Insufficient documentation

## 2018-02-08 DIAGNOSIS — Z888 Allergy status to other drugs, medicaments and biological substances status: Secondary | ICD-10-CM | POA: Diagnosis not present

## 2018-02-08 DIAGNOSIS — F411 Generalized anxiety disorder: Secondary | ICD-10-CM | POA: Insufficient documentation

## 2018-02-08 DIAGNOSIS — Z8249 Family history of ischemic heart disease and other diseases of the circulatory system: Secondary | ICD-10-CM | POA: Insufficient documentation

## 2018-02-08 DIAGNOSIS — Z794 Long term (current) use of insulin: Secondary | ICD-10-CM | POA: Diagnosis not present

## 2018-02-08 DIAGNOSIS — M25552 Pain in left hip: Secondary | ICD-10-CM | POA: Insufficient documentation

## 2018-02-08 DIAGNOSIS — M4726 Other spondylosis with radiculopathy, lumbar region: Secondary | ICD-10-CM | POA: Diagnosis not present

## 2018-02-08 DIAGNOSIS — Z7982 Long term (current) use of aspirin: Secondary | ICD-10-CM | POA: Insufficient documentation

## 2018-02-08 DIAGNOSIS — Z8673 Personal history of transient ischemic attack (TIA), and cerebral infarction without residual deficits: Secondary | ICD-10-CM | POA: Diagnosis not present

## 2018-02-08 DIAGNOSIS — E119 Type 2 diabetes mellitus without complications: Secondary | ICD-10-CM | POA: Diagnosis not present

## 2018-02-08 DIAGNOSIS — I10 Essential (primary) hypertension: Secondary | ICD-10-CM | POA: Diagnosis not present

## 2018-02-08 DIAGNOSIS — M792 Neuralgia and neuritis, unspecified: Secondary | ICD-10-CM | POA: Diagnosis not present

## 2018-02-08 DIAGNOSIS — Z79899 Other long term (current) drug therapy: Secondary | ICD-10-CM | POA: Diagnosis not present

## 2018-02-08 DIAGNOSIS — M419 Scoliosis, unspecified: Secondary | ICD-10-CM | POA: Diagnosis not present

## 2018-02-08 DIAGNOSIS — M25551 Pain in right hip: Secondary | ICD-10-CM | POA: Insufficient documentation

## 2018-02-08 DIAGNOSIS — E559 Vitamin D deficiency, unspecified: Secondary | ICD-10-CM | POA: Diagnosis not present

## 2018-02-08 DIAGNOSIS — G3184 Mild cognitive impairment, so stated: Secondary | ICD-10-CM | POA: Insufficient documentation

## 2018-02-08 DIAGNOSIS — R079 Chest pain, unspecified: Secondary | ICD-10-CM | POA: Insufficient documentation

## 2018-02-08 DIAGNOSIS — D860 Sarcoidosis of lung: Secondary | ICD-10-CM | POA: Diagnosis not present

## 2018-02-08 DIAGNOSIS — M545 Low back pain: Secondary | ICD-10-CM | POA: Diagnosis present

## 2018-02-08 DIAGNOSIS — G4700 Insomnia, unspecified: Secondary | ICD-10-CM | POA: Insufficient documentation

## 2018-02-08 DIAGNOSIS — M4180 Other forms of scoliosis, site unspecified: Secondary | ICD-10-CM

## 2018-02-08 DIAGNOSIS — G894 Chronic pain syndrome: Secondary | ICD-10-CM | POA: Diagnosis not present

## 2018-02-08 DIAGNOSIS — M5416 Radiculopathy, lumbar region: Secondary | ICD-10-CM

## 2018-02-08 DIAGNOSIS — R32 Unspecified urinary incontinence: Secondary | ICD-10-CM | POA: Insufficient documentation

## 2018-02-08 DIAGNOSIS — F329 Major depressive disorder, single episode, unspecified: Secondary | ICD-10-CM | POA: Diagnosis not present

## 2018-02-08 DIAGNOSIS — Z823 Family history of stroke: Secondary | ICD-10-CM | POA: Diagnosis not present

## 2018-02-08 DIAGNOSIS — M533 Sacrococcygeal disorders, not elsewhere classified: Secondary | ICD-10-CM | POA: Diagnosis not present

## 2018-02-08 DIAGNOSIS — M170 Bilateral primary osteoarthritis of knee: Secondary | ICD-10-CM | POA: Insufficient documentation

## 2018-02-08 DIAGNOSIS — K7460 Unspecified cirrhosis of liver: Secondary | ICD-10-CM | POA: Diagnosis not present

## 2018-02-08 DIAGNOSIS — R1084 Generalized abdominal pain: Secondary | ICD-10-CM | POA: Insufficient documentation

## 2018-02-08 DIAGNOSIS — E785 Hyperlipidemia, unspecified: Secondary | ICD-10-CM | POA: Diagnosis not present

## 2018-02-08 DIAGNOSIS — M546 Pain in thoracic spine: Secondary | ICD-10-CM | POA: Insufficient documentation

## 2018-02-08 MED ORDER — OXYCODONE-ACETAMINOPHEN 10-325 MG PO TABS: 1.0000 | ORAL_TABLET | Freq: Four times a day (QID) | ORAL | 0 refills | Status: DC | PRN

## 2018-02-08 MED ORDER — OXYCODONE-ACETAMINOPHEN 10-325 MG PO TABS
1.0000 | ORAL_TABLET | Freq: Four times a day (QID) | ORAL | 0 refills | Status: DC | PRN
Start: 2018-04-09 — End: 2018-05-16

## 2018-02-08 MED ORDER — GABAPENTIN 600 MG PO TABS: 600.0000 mg | ORAL_TABLET | Freq: Three times a day (TID) | ORAL | 0 refills | Status: DC

## 2018-02-08 NOTE — Progress Notes (Signed)
Patient's Name: Douglas Andrews  MRN: [id number]  Referring Provider: Fritzi Mandes, MD  DOB: 01-Jun-1951  PCP: Douglas Mandes, MD  DOS: 02/08/2018  Note by: Douglas Francois NP  Service setting: Ambulatory outpatient  Specialty: Interventional Pain Management  Location: [facility] (AMB) Pain Management Facility    Patient type: Established    Primary Reason(s) for Visit: Encounter for prescription drug management & post-procedure evaluation of chronic illness with mild to moderate exacerbation(Level of risk: moderate) CC: Back Pain (lower)  HPI  Douglas Andrews is a 67 y.o. year old, male patient, who comes today for a post-procedure evaluation and medication management. He has Chronic low back pain (Primary Source of Pain) (Bilateral) (L>R); Chronic pain syndrome; Lumbar DDD (degenerative disc disease); Lumbar Spondylosis; Lumbar radicular pain (Left) (L5 Dermatome); Lumbar facet syndrome (Bilateral) (R>L); Opiate use (60 MME/Day); Uncomplicated opioid dependence ([place]); Long term prescription opiate use; Chronic knee pain (Tertiary source of pain) (Bilateral) (Right); Vitamin D insufficiency; Overweight; Bell palsy; CN (constipation); Dermatitis, eczematoid; Diabetes mellitus ([place]); Dysmetria; GAD (generalized anxiety disorder); Insomnia due to medical condition; Long term current use of systemic steroids; Poorly controlled type 2 diabetes mellitus ([place]); Pulmonary sarcoidosis ([place]); Arthropathy, traumatic, shoulder; Bursitis, trochanteric; Varices, esophageal ([place]); Leg varices; Hepatic cirrhosis ([place]); Major depressive disorder, single episode; Other long term (current) drug therapy; At risk for falling; Encounter for chronic pain management; Encounter for therapeutic drug level monitoring; Chronic lumbar radicular pain (Left) (L5 Dermatome); Lumbar Levoscoliosis; Neurogenic pain; Osteoarthritis of knee (Right); At high risk for falls; Chest pain; Skin lesion; Pain of left femur (left femoral rod due to  sarcoidosis); Chronic hip pain (Right); Osteoarthritis of knee (Bilateral) (R>L); Essential hypertension; Folliculitis; Chronic sacroiliac joint pain (Left); Chronic hip pain (Left); Chronic thoracic back pain (Left); Diarrhea; Numbness on left side; Gastroenteritis; Other fatigue; Urinary incontinence without sensory awareness; Balanitis; Generalized abdominal pain; Hyperglycemic crisis in diabetes mellitus ([place]); Osteoarthritis of lumbar spine; Chronic lower extremity pain (Secondary Area of Pain) (Bilateral) (L>R); Retrolisthesis (2 mm) of L4 over L5; Anterolisthesis (7 mm) L5 over S1; Lumbar facet arthropathy (L4-5 and L5-S1) (Bilateral); Lumbar facet osteoarthritis (Bilateral); History of lacunar cerebrovascular accident; MCI (mild cognitive impairment); Acute postoperative pain; Right-sided lacunar infarction [facility]); Spondylosis without myelopathy or radiculopathy, lumbosacral region; Cellulitis of left upper extremity; On statin therapy; Sleep disturbance; and Diabetes mellitus with complication (HCC) on their problem list. His primarily concern today is the Back Pain (lower)  Pain Assessment: Location:   Back Duration: Chronic pain Severity: 0-No pain(Denies pain since procedures)/10 (self-reported pain score)  Note: Reported level is compatible with observation.                           [name] was last seen on 01/03/2018 for a procedure. During today's appointment we reviewed Douglas Andrews post-procedure results, as well as his outpatient medication regimen. He states that he is able to bend over and not be in pain.  Further details on both, my assessment(s), as well as the proposed treatment plan, please see below.  Controlled Substance Pharmacotherapy Assessment REMS (Risk Evaluation and Mitigation Strategy)  Analgesic:Oxycodone/APAP 10/325 one every 6 hours (40 mg/day) MME/day:60 mg/day    [name], RN  02/08/2018  1:40 PM  Sign at close encounter Nursing Pain Medication  Assessment:  Safety precautions to be maintained throughout the outpatient stay will include: orient to surroundings, keep bed in low position, maintain call bell within reach at all times, provide  assistance with transfer out of bed and ambulation.  Medication Inspection Compliance: Pill count conducted under aseptic conditions, in front of the patient. Neither the pills nor the bottle was removed from the patient's sight at any time. Once count was completed pills were immediately returned to the patient in their original bottle.  Medication: See above Pill/Patch Count: 112 of 112 pills remain Pill/Patch Appearance: Markings consistent with prescribed medication Bottle Appearance: Standard pharmacy container. Clearly labeled. Filled Date: 04 / 30 / 2018 Last Medication intake:  Day before yesterday   Pharmacokinetics: Liberation and absorption (onset of action): WNL Distribution (time to peak effect): WNL Metabolism and excretion (duration of action): WNL         Pharmacodynamics: Desired effects: Analgesia: Douglas Andrews reports >50% benefit. Functional ability: Patient reports that medication allows him to accomplish basic ADLs Clinically meaningful improvement in function (CMIF): Sustained CMIF goals met Perceived effectiveness: Described as relatively effective, allowing for increase in activities of daily living (ADL) Undesirable effects: Side-effects or Adverse reactions: None reported Monitoring: [place] PMP: Online review of the past 85-monthperiod conducted. Compliant with practice rules and regulations Last UDS on record: Summary  Date Value Ref Range Status  12/06/2017 FINAL  Final    Comment:    ==================================================================== TOXASSURE SELECT 13 (MW) ==================================================================== Test                             Result       Flag       Units Drug Present and Declared for Prescription Verification    Noroxycodone                   773          EXPECTED   ng/mg creat    Noroxycodone is an expected metabolite of oxycodone. Sources of    oxycodone include scheduled prescription medications. Drug Present not Declared for Prescription Verification   7-aminoclonazepam              63           UNEXPECTED ng/mg creat    7-aminoclonazepam is an expected metabolite of clonazepam. Source    of clonazepam is a scheduled prescription medication. Drug Absent but Declared for Prescription Verification   Oxycodone                      Not Detected UNEXPECTED ng/mg creat    Oxycodone is almost always present in patients taking this drug    consistently.  Absence of oxycodone could be due to lapse of time    since the last dose or unusual pharmacokinetics (rapid    metabolism). ==================================================================== Test                      Result    Flag   Units      Ref Range   Creatinine              41               mg/dL      >=20 ==================================================================== Declared Medications:  The flagging and interpretation on this report are based on the  following declared medications.  Unexpected results may arise from  inaccuracies in the declared medications.  **Note: The testing scope of this panel includes these medications:  Oxycodone (Percocet)  **Note: The testing scope of this panel does not include  following  reported medications:  Acetaminophen (Percocet)  Albuterol  Aspirin (Aspirin 81)  Atorvastatin (Lipitor)  Cetirizine (Zyrtec)  Clotrimazole (Lotrimin)  Gabapentin (Neurontin)  Insulin (Humalog)  Insulin (Lantus)  Isosorbide (Imdur)  Liraglutide (Victoza)  Lisinopril  Metformin  Nystatin  Vitamin D3 ==================================================================== For clinical consultation, please call [phone]. ====================================================================    UDS  interpretation: Compliant          Medication Assessment Form: Reviewed. Patient indicates being compliant with therapy Treatment compliance: Compliant Risk Assessment Profile: Aberrant behavior: See prior evaluations. None observed or detected today Comorbid factors increasing risk of overdose: See prior notes. No additional risks detected today Risk of substance use disorder (SUD): Low Opioid Risk Tool - 02/08/18 1338      Family History of Substance Abuse   Alcohol  Negative    Illegal Drugs  Negative    Rx Drugs  Negative      Personal History of Substance Abuse   Alcohol  Negative    Illegal Drugs  Negative    Rx Drugs  Negative      History of Preadolescent Sexual Abuse   History of Preadolescent Sexual Abuse  Negative or Male      Psychological Disease   Psychological Disease  Negative    Depression  Positive      Total Score   Opioid Risk Tool Scoring  1    Opioid Risk Interpretation  Low Risk      ORT Scoring interpretation table:  Score <3 = Low Risk for SUD  Score between 4-7 = Moderate Risk for SUD  Score >8 = High Risk for Opioid Abuse   Risk Mitigation Strategies:  Patient Counseling: Covered Patient-Prescriber Agreement (PPA): Present and active  Notification to other healthcare providers: Done  Pharmacologic Plan: No change in therapy, at this time.             Post-Procedure Assessment  12/28/2017 Procedure: left Radiofrequency Neurotomy Pre-procedure pain score:  6/10 Post-procedure pain score: 0/10         Influential Factors: BMI: 24.12 kg/m Intra-procedural challenges: None observed.         Assessment challenges: None detected.              Reported side-effects: None.        Post-procedural adverse reactions or complications: None reported         Sedation: Please see nurses note. When no sedatives are used, the analgesic levels obtained are directly associated to the effectiveness of the local anesthetics. However, when sedation is  provided, the level of analgesia obtained during the initial 1 hour following the intervention, is believed to be the result of a combination of factors. These factors may include, but are not limited to: 1. The effectiveness of the local anesthetics used. 2. The effects of the analgesic(s) and/or anxiolytic(s) used. 3. The degree of discomfort experienced by the patient at the time of the procedure. 4. The patients ability and reliability in recalling and recording the events. 5. The presence and influence of possible secondary gains and/or psychosocial factors. Reported result: Relief experienced during the 1st hour after the procedure: 100 % (Ultra-Short Term Relief)            Interpretative annotation: Clinically appropriate result. Analgesia during this period is likely to be Local Anesthetic and/or IV Sedative (Analgesic/Anxiolytic) related.          Effects of local anesthetic: The analgesic effects attained during this period are directly associated to the  localized infiltration of local anesthetics and therefore cary significant diagnostic value as to the etiological location, or anatomical origin, of the pain. Expected duration of relief is directly dependent on the pharmacodynamics of the local anesthetic used. Long-acting (4-6 hours) anesthetics used.  Reported result: Relief during the next 4 to 6 hour after the procedure: 100 % (Short-Term Relief)            Interpretative annotation: Clinically appropriate result. Analgesia during this period is likely to be Local Anesthetic-related.          Long-term benefit: Defined as the period of time past the expected duration of local anesthetics (1 hour for short-acting and 4-6 hours for long-acting). With the possible exception of prolonged sympathetic blockade from the local anesthetics, benefits during this period are typically attributed to, or associated with, other factors such as analgesic sensory neuropraxia, antiinflammatory effects, or  beneficial biochemical changes provided by agents other than the local anesthetics.  Reported result: Extended relief following procedure: 80 % (Long-Term Relief)            Interpretative annotation: Clinically appropriate result. Good relief. No permanent benefit expected. Inflammation plays a part in the etiology to the pain.          Current benefits: Defined as reported results that persistent at this point in time.   Analgesia: >75 %            Function: Mr. Zerby reports improvement in function ROM: Mr. Lopezperez reports improvement in ROM Interpretative annotation: Complete relief. Therapeutic success.             Interpretation: Results would suggest a successful palliative intervention.                  Plan:  Please see "Plan of Care" for details.                Laboratory Chemistry  Inflammation Markers (CRP: Acute Phase) (ESR: Chronic Phase) Lab Results  Component Value Date   CRP <0.5 03/30/2016   ESRSEDRATE 1 03/30/2016                         Rheumatology Markers No results found for: RF, ANA, Therisa Doyne, [facility]                      Renal Function Markers Lab Results  Component Value Date   BUN 12 03/30/2016   CREATININE 0.85 03/30/2016   GFRAA >60 03/30/2016   GFRNONAA >60 03/30/2016                              Hepatic Function Markers Lab Results  Component Value Date   AST 24 03/30/2016   ALT 27 03/30/2016   ALBUMIN 4.6 03/30/2016   ALKPHOS 63 03/30/2016                        Electrolytes Lab Results  Component Value Date   NA 136 03/30/2016   K 3.8 03/30/2016   CL 100 (L) 03/30/2016   CALCIUM 9.2 03/30/2016   MG 2.2 03/30/2016                        Neuropathy Markers Lab Results  Component Value Date   VITAMINB12 539 03/30/2016  Bone Pathology Markers Lab Results  Component Value Date   25OHVITD1 38 03/30/2016   25OHVITD2 3.4 03/30/2016   25OHVITD3 35 03/30/2016                          Coagulation Parameters No results found for: INR, LABPROT, APTT, PLT, DDIMER                      Cardiovascular Markers No results found for: BNP, CKTOTAL, CKMB, TROPONINI, HGB, HCT                       CA Markers No results found for: CEA, CA125, LABCA2                      Note: Lab results reviewed.  Recent Diagnostic Imaging Results  DG C-Arm 1-60 Min-No Report Fluoroscopy was utilized by the requesting physician.  No radiographic  interpretation.   Complexity Note: Imaging results reviewed. Results shared with Mr. Honse, using Layman's terms.                         Meds   Current Outpatient Medications:  .  albuterol (PROAIR HFA) 108 (90 Base) MCG/ACT inhaler, INHALE 2 PUFFS BY MOUTH EVERY 4 HOURS ASNEEDED FOR WHEEZING, Disp: , Rfl:  .  aspirin EC 81 MG tablet, Take 81 mg by mouth., Disp: , Rfl:  .  atorvastatin (LIPITOR) 40 MG tablet, Take 40 mg by mouth daily., Disp: , Rfl:  .  cetirizine (ZYRTEC) 10 MG tablet, Take 10 mg by mouth daily. , Disp: , Rfl:  .  Cholecalciferol (VITAMIN D3) 5000 units TABS, Take 1 tablet by mouth daily. , Disp: , Rfl:  .  clotrimazole (LOTRIMIN) 1 % cream, 1 application as needed. , Disp: , Rfl:  .  gabapentin (NEURONTIN) 600 MG tablet, Take 1 tablet (600 mg total) by mouth every 8 (eight) hours., Disp: 270 tablet, Rfl: 0 .  glucose blood (ACCU-CHEK ACTIVE STRIPS) test strip, Checks 3 times daily, Disp: , Rfl:  .  insulin glargine (LANTUS) 100 UNIT/ML injection, Inject 60 Units into the skin at bedtime. , Disp: , Rfl:  .  insulin lispro (HUMALOG) 100 UNIT/ML injection, Inject 18 Units into the skin 3 (three) times daily with meals., Disp: , Rfl:  .  Insulin Pen Needle (UNIFINE PENTIPS) 31G X 6 MM MISC, USE AS DIRECTED WITH LANTUS & NOVOLOG, Disp: , Rfl:  .  isosorbide mononitrate (IMDUR) 30 MG 24 hr tablet, 30 mg daily. , Disp: , Rfl:  .  lisinopril (PRINIVIL,ZESTRIL) 10 MG tablet, Take 10 mg by mouth daily. , Disp: , Rfl:  .  metFORMIN  (GLUCOPHAGE) 500 MG tablet, Take 1,000 mg by mouth 2 (two) times daily with a meal., Disp: , Rfl:  .  nystatin (MYCOSTATIN/NYSTOP) powder, as needed. , Disp: , Rfl:  .  [START ON 04/09/2018] oxyCODONE-acetaminophen (PERCOCET) 10-325 MG tablet, Take 1 tablet by mouth every 6 (six) hours as needed for pain., Disp: 120 tablet, Rfl: 0 .  [START ON 03/10/2018] oxyCODONE-acetaminophen (PERCOCET) 10-325 MG tablet, Take 1 tablet by mouth every 6 (six) hours as needed for pain., Disp: 120 tablet, Rfl: 0 .  VICTOZA 18 MG/3ML SOPN, Inject into the skin 3 (three) times daily. , Disp: , Rfl:  .  albuterol (PROVENTIL) (5 MG/ML) 0.5% nebulizer solution, Take 2.5 mg by nebulization every  6 (six) hours as needed for wheezing or shortness of breath. , Disp: , Rfl:  .  Insulin Pen Needle (UNIFINE PENTIPS) 31G X 6 MM MISC, , Disp: , Rfl:  .  [START ON 05/09/2018] oxyCODONE-acetaminophen (PERCOCET) 10-325 MG tablet, Take 1 tablet by mouth every 6 (six) hours as needed for pain., Disp: 120 tablet, Rfl: 0  ROS  Constitutional: Denies any fever or chills Gastrointestinal: No reported hemesis, hematochezia, vomiting, or acute GI distress Musculoskeletal: Denies any acute onset joint swelling, redness, loss of ROM, or weakness Neurological: No reported episodes of acute onset apraxia, aphasia, dysarthria, agnosia, amnesia, paralysis, loss of coordination, or loss of consciousness  Allergies  Mr. Tremont is allergic to sertraline.  [place]  Drug: Mr. Rahming  reports that he does not use drugs. Alcohol:  reports that he does not drink alcohol. Tobacco:  reports that he has never smoked. He has never used smokeless tobacco. Medical:  has a past medical history of Chronic back pain, Diabetes mellitus without complication ([place]), Hyperlipidemia, Hypertension, Left leg pain (01/02/2014), Right leg pain (07/15/2015), and Sarcoidosis. Surgical: Mr. Girouard  has a past surgical history that includes Appendectomy; Tonsillectomy; and left  rod. Family: family history includes Diabetes in his father; Heart disease in his father and mother; Stroke in his father.  Constitutional Exam  General appearance: Well nourished, well developed, and well hydrated. In no apparent acute distress Vitals:   02/08/18 1326 02/08/18 1329  BP:  (!) 151/98  Pulse: 91   Resp: 16   Temp: 98.1 F (36.7 C)   SpO2: 99%   Weight: 161 lb (73 kg)   Height: 5' 8.5" (1.74 m)   Psych/Mental status: Alert, oriented x 3 (person, place, & time)       Eyes: PERLA Respiratory: No evidence of acute respiratory distress  Lumbar Spine Area Exam  Skin & Axial Inspection: No masses, redness, or swelling Alignment: Symmetrical Functional ROM: Unrestricted ROM       Stability: No instability detected Muscle Tone/Strength: Functionally intact. No obvious neuro-muscular anomalies detected. Sensory (Neurological): Unimpaired Palpation: Tender       Provocative Tests: Lumbar Hyperextension and rotation test: evaluation deferred today       Lumbar Lateral bending test: evaluation deferred today       Patrick's Maneuver: evaluation deferred today                    Gait & Posture Assessment  Ambulation: Unassisted Gait: Relatively normal for age and body habitus Posture: WNL   Lower Extremity Exam    Side: Right lower extremity  Side: Left lower extremity  Skin & Extremity Inspection: Skin color, temperature, and hair growth are WNL. No peripheral edema or cyanosis. No masses, redness, swelling, asymmetry, or associated skin lesions. No contractures.  Skin & Extremity Inspection: Skin color, temperature, and hair growth are WNL. No peripheral edema or cyanosis. No masses, redness, swelling, asymmetry, or associated skin lesions. No contractures.  Functional ROM: Unrestricted ROM          Functional ROM: Unrestricted ROM          Muscle Tone/Strength: Functionally intact. No obvious neuro-muscular anomalies detected.  Muscle Tone/Strength: Functionally intact.  No obvious neuro-muscular anomalies detected.  Sensory (Neurological): Unimpaired  Sensory (Neurological): Unimpaired  Palpation: No palpable anomalies  Palpation: No palpable anomalies   Assessment  Primary Diagnosis & Pertinent Problem List: The primary encounter diagnosis was Lumbar Spondylosis. Diagnoses of Lumbar Levoscoliosis, Lumbar radicular pain (Left) (L5 Dermatome),  Chronic pain syndrome, and Neurogenic pain were also pertinent to this visit.  Status Diagnosis  Controlled Controlled Controlled 1. Lumbar Spondylosis   2. Lumbar Levoscoliosis   3. Lumbar radicular pain (Left) (L5 Dermatome)   4. Chronic pain syndrome   5. Neurogenic pain     Problems updated and reviewed during this visit: No problems updated. Plan of Care  Pharmacotherapy (Medications Ordered): Meds ordered this encounter  Medications  . oxyCODONE-acetaminophen (PERCOCET) 10-325 MG tablet    Sig: Take 1 tablet by mouth every 6 (six) hours as needed for pain.    Dispense:  120 tablet    Refill:  0    Do not place this medication, or any other prescription from our practice, on "Automatic Refill". Patient may have prescription filled one day early if pharmacy is closed on scheduled refill date. Do not fill until: 05/09/2018 To last until:06/08/2018    Order Specific Question:   Supervising Provider    Answer:   Milinda Pointer [phone]  . oxyCODONE-acetaminophen (PERCOCET) 10-325 MG tablet    Sig: Take 1 tablet by mouth every 6 (six) hours as needed for pain.    Dispense:  120 tablet    Refill:  0    Do not place this medication, or any other prescription from our practice, on "Automatic Refill". Patient may have prescription filled one day early if pharmacy is closed on scheduled refill date. Do not fill until:04/09/2018 To last until: 05/09/2018    Order Specific Question:   Supervising Provider    Answer:   Milinda Pointer [phone]  . oxyCODONE-acetaminophen (PERCOCET) 10-325 MG tablet    Sig:  Take 1 tablet by mouth every 6 (six) hours as needed for pain.    Dispense:  120 tablet    Refill:  0    Do not place this medication, or any other prescription from our practice, on "Automatic Refill". Patient may have prescription filled one day early if pharmacy is closed on scheduled refill date. Do not fill until: 03/10/2018 To last until:04/09/2018    Order Specific Question:   Supervising Provider    Answer:   Milinda Pointer [phone]  . gabapentin (NEURONTIN) 600 MG tablet    Sig: Take 1 tablet (600 mg total) by mouth every 8 (eight) hours.    Dispense:  270 tablet    Refill:  0    Do not place this medication, or any other prescription from our practice, on "Automatic Refill". Patient may have prescription filled one day early if pharmacy is closed on scheduled refill date.    Order Specific Question:   Supervising Provider    Answer:   Milinda Pointer [[id number]]   New Prescriptions   No medications on file   Medications administered today: Mainor Hellmann had no medications administered during this visit. Lab-work, procedure(s), and/or referral(s): No orders of the defined types were placed in this encounter.  Imaging and/or referral(s): None  Interventional management options: Planned, scheduled, and/or pending: Not at this time.    Considering: Diagnostic bilateral Lumbar facet block Possible bilateral lumbar facet RFA (left side first)  Diagnostic right intra-articular hip injection Possible radiofrequency of the right hipjoint Diagnostic Right L4-5 lumbar epidural steroid injection Diagnostic bilateral intra-articular knee injection Possible series of 5 Hyalgan knee injections.  Diagnostic bilateral Genicular nerve block Possible bilateral Genicular nerve radiofrequencyablation   Palliative PRN treatment(s): Diagnostic bilateral Lumbar facet block #2 Diagnostic right intra-articular hip injection Diagnostic Right L4-5 lumbar epidural steroid  injection Diagnostic bilateral intra-articular  knee injection Possible series of 5 Hyalgan knee injections.  Diagnostic bilateral Genicular nerve block      Provider-requested follow-up: Return in about 3 months (around 05/11/2018) for MedMgmt with Me Donella Stade Edison Pace).  Future Appointments  Date Time Provider [place]  05/09/2018 10:30 AM Douglas Francois, NP [facility] None   Primary Care Physician: Douglas Mandes, MD Location: [facility] Outpatient Pain Management Facility Note by: Douglas Francois NP Date: 02/08/2018; Time: 11:14 PM  Pain Score Disclaimer: We use the NRS-11 scale. This is a self-reported, subjective measurement of pain severity with only modest accuracy. It is used primarily to identify changes within a particular patient. It must be understood that outpatient pain scales are significantly less accurate that those used for research, where they can be applied under ideal controlled circumstances with minimal exposure to variables. In reality, the score is likely to be a combination of pain intensity and pain affect, where pain affect describes the degree of emotional arousal or changes in action readiness caused by the sensory experience of pain. Factors such as social and work situation, setting, emotional state, anxiety levels, expectation, and prior pain experience may influence pain perception and show large inter-individual differences that may also be affected by time variables.  Patient instructions provided during this appointment: Patient Instructions  ___You have been given 3 scripts for percocet today.  you have scripts to be picked up from the pharmacy_________________________________________________________________________________________  Medication Rules  Applies to: All patients receiving prescriptions (written or electronic).  Pharmacy of record: Pharmacy where electronic prescriptions will be sent. If written prescriptions are taken to a different  pharmacy, please inform the nursing staff. The pharmacy listed in the electronic medical record should be the one where you would like electronic prescriptions to be sent.  Prescription refills: Only during scheduled appointments. Applies to both, written and electronic prescriptions.  NOTE: The following applies primarily to controlled substances (Opioid* Pain Medications).   Patient's responsibilities: 1. Pain Pills: Bring all pain pills to every appointment (except for procedure appointments). 2. Pill Bottles: Bring pills in original pharmacy bottle. Always bring newest bottle. Bring bottle, even if empty. 3. Medication refills: You are responsible for knowing and keeping track of what medications you need refilled. The day before your appointment, write a list of all prescriptions that need to be refilled. Bring that list to your appointment and give it to the admitting nurse. Prescriptions will be written only during appointments. If you forget a medication, it will not be "Called in", "Faxed", or "electronically sent". You will need to get another appointment to get these prescribed. 4. Prescription Accuracy: You are responsible for carefully inspecting your prescriptions before leaving our office. Have the discharge nurse carefully go over each prescription with you, before taking them home. Make sure that your name is accurately spelled, that your address is correct. Check the name and dose of your medication to make sure it is accurate. Check the number of pills, and the written instructions to make sure they are clear and accurate. Make sure that you are given enough medication to last until your next medication refill appointment. 5. Taking Medication: Take medication as prescribed. Never take more pills than instructed. Never take medication more frequently than prescribed. Taking less pills or less frequently is permitted and encouraged, when it comes to controlled substances (written  prescriptions).  6. Inform other Doctors: Always inform, all of your healthcare providers, of all the medications you take. 7. Pain Medication from other Providers: You  are not allowed to accept any additional pain medication from any other Doctor or Healthcare provider. There are two exceptions to this rule. (see below) In the event that you require additional pain medication, you are responsible for notifying us, as stated below. 8. Medication Agreement: You are responsible for carefully reading and following our Medication Agreement. This must be signed before receiving any prescriptions from our practice. Safely store a copy of your signed Agreement. Violations to the Agreement will result in no further prescriptions. (Additional copies of our Medication Agreement are available upon request.) 9. Laws, Rules, & Regulations: All patients are expected to follow all Federal and [employer], Rules, [employer] [facility] [employer]. Ignorance of the Laws does not constitute a valid excuse. The use of any illegal substances is prohibited. 10. Adopted CDC guidelines & recommendations: Target dosing levels will be at or below 60 MME/day. Use of benzodiazepines** is not recommended.  Exceptions: There are only two exceptions to the rule of not receiving pain medications from other Healthcare Providers. 1. Exception #1 (Emergencies): In the event of an emergency (i.e.: accident requiring emergency care), you are allowed to receive additional pain medication. However, you are responsible for: As soon as you are able, call our office [phone], at any time of the day or night, and leave a message stating your name, the date and nature of the emergency, and the name and dose of the medication prescribed. In the event that your call is answered by a member of our staff, make sure to document and save the date, time, and the name of the person that took your information.  2. Exception #2 (Planned Surgery): In the event that  you are scheduled by another doctor or dentist to have any type of surgery or procedure, you are allowed (for a period no longer than 30 days), to receive additional pain medication, for the acute post-op pain. However, in this case, you are responsible for picking up a copy of our "Post-op Pain Management for Surgeons" handout, and giving it to your surgeon or dentist. This document is available at our office, and does not require an appointment to obtain it. Simply go to our office during business hours (Monday-Thursday from 8:00 AM to 4:00 PM) (Friday 8:00 AM to 12:00 Noon) or if you have a scheduled appointment with [place], prior to your surgery, and ask for it by name. In addition, you will need to provide [place] with your name, name of your surgeon, type of surgery, and date of procedure or surgery.  *Opioid medications include: morphine, codeine, oxycodone, oxymorphone, hydrocodone, hydromorphone, meperidine, tramadol, tapentadol, buprenorphine, fentanyl, methadone. **Benzodiazepine medications include: diazepam (Valium), alprazolam (Xanax), clonazepam (Klonopine), lorazepam (Ativan), clorazepate (Tranxene), chlordiazepoxide (Librium), estazolam (Prosom), oxazepam (Serax), temazepam (Restoril), triazolam (Halcion) (Last updated: 12/07/2017) ____________________________________________________________________________________________

## 2018-02-08 NOTE — Progress Notes (Signed)
Nursing Pain Medication Assessment:  Safety precautions to be maintained throughout the outpatient stay will include: orient to surroundings, keep bed in low position, maintain call bell within reach at all times, provide assistance with transfer out of bed and ambulation.  Medication Inspection Compliance: Pill count conducted under aseptic conditions, in front of the patient. Neither the pills nor the bottle was removed from the patient's sight at any time. Once count was completed pills were immediately returned to the patient in their original bottle.  Medication: See above Pill/Patch Count: 112 of 112 pills remain Pill/Patch Appearance: Markings consistent with prescribed medication Bottle Appearance: Standard pharmacy container. Clearly labeled. Filled Date: 04 / 30 / 2018 Last Medication intake:  Day before yesterday

## 2018-02-08 NOTE — Patient Instructions (Addendum)
___You have been given 3 scripts for percocet today.  you have scripts to be picked up from the pharmacy_________________________________________________________________________________________  Medication Rules  Applies to: All patients receiving prescriptions (written or electronic).  Pharmacy of record: Pharmacy where electronic prescriptions will be sent. If written prescriptions are taken to a different pharmacy, please inform the nursing staff. The pharmacy listed in the electronic medical record should be the one where you would like electronic prescriptions to be sent.  Prescription refills: Only during scheduled appointments. Applies to both, written and electronic prescriptions.  NOTE: The following applies primarily to controlled substances (Opioid* Pain Medications).   Patient's responsibilities: 1. Pain Pills: Bring all pain pills to every appointment (except for procedure appointments). 2. Pill Bottles: Bring pills in original pharmacy bottle. Always bring newest bottle. Bring bottle, even if empty. 3. Medication refills: You are responsible for knowing and keeping track of what medications you need refilled. The day before your appointment, write a list of all prescriptions that need to be refilled. Bring that list to your appointment and give it to the admitting nurse. Prescriptions will be written only during appointments. If you forget a medication, it will not be "Called in", "Faxed", or "electronically sent". You will need to get another appointment to get these prescribed. 4. Prescription Accuracy: You are responsible for carefully inspecting your prescriptions before leaving our office. Have the discharge nurse carefully go over each prescription with you, before taking them home. Make sure that your name is accurately spelled, that your address is correct. Check the name and dose of your medication to make sure it is accurate. Check the number of pills, and the written  instructions to make sure they are clear and accurate. Make sure that you are given enough medication to last until your next medication refill appointment. 5. Taking Medication: Take medication as prescribed. Never take more pills than instructed. Never take medication more frequently than prescribed. Taking less pills or less frequently is permitted and encouraged, when it comes to controlled substances (written prescriptions).  6. Inform other Doctors: Always inform, all of your healthcare providers, of all the medications you take. 7. Pain Medication from other Providers: You are not allowed to accept any additional pain medication from any other Doctor or Healthcare provider. There are two exceptions to this rule. (see below) In the event that you require additional pain medication, you are responsible for notifying us, as stated below. 8. Medication Agreement: You are responsible for carefully reading and following our Medication Agreement. This must be signed before receiving any prescriptions from our practice. Safely store a copy of your signed Agreement. Violations to the Agreement will result in no further prescriptions. (Additional copies of our Medication Agreement are available upon request.) 9. Laws, Rules, & Regulations: All patients are expected to follow all [address] and [employer], Rules, [employer]. Ignorance of the Laws does not constitute a valid excuse. The use of any illegal substances is prohibited. 10. Adopted CDC guidelines & recommendations: Target dosing levels will be at or below 60 MME/day. Use of benzodiazepines** is not recommended.  Exceptions: There are only two exceptions to the rule of not receiving pain medications from other Healthcare Providers. 1. Exception #1 (Emergencies): In the event of an emergency (i.e.: accident requiring emergency care), you are allowed to receive additional pain medication. However, you are responsible for: As soon as you are  able, call our office [phone], at any time of the day or night, and leave a  message stating your name, the date and nature of the emergency, and the name and dose of the medication prescribed. In the event that your call is answered by a member of our staff, make sure to document and save the date, time, and the name of the person that took your information.  2. Exception #2 (Planned Surgery): In the event that you are scheduled by another doctor or dentist to have any type of surgery or procedure, you are allowed (for a period no longer than 30 days), to receive additional pain medication, for the acute post-op pain. However, in this case, you are responsible for picking up a copy of our "Post-op Pain Management for Surgeons" handout, and giving it to your surgeon or dentist. This document is available at our office, and does not require an appointment to obtain it. Simply go to our office during business hours (Monday-Thursday from 8:00 AM to 4:00 PM) (Friday 8:00 AM to 12:00 Noon) or if you have a scheduled appointment with [place], prior to your surgery, and ask for it by name. In addition, you will need to provide [place] with your name, name of your surgeon, type of surgery, and date of procedure or surgery.  *Opioid medications include: morphine, codeine, oxycodone, oxymorphone, hydrocodone, hydromorphone, meperidine, tramadol, tapentadol, buprenorphine, fentanyl, methadone. **Benzodiazepine medications include: diazepam (Valium), alprazolam (Xanax), clonazepam (Klonopine), lorazepam (Ativan), clorazepate (Tranxene), chlordiazepoxide (Librium), estazolam (Prosom), oxazepam (Serax), temazepam (Restoril), triazolam (Halcion) (Last updated: 12/07/2017) ____________________________________________________________________________________________

## 2018-03-08 ENCOUNTER — Telehealth: Payer: Self-pay | Admitting: Nurse Practitioner

## 2018-03-08 NOTE — Telephone Encounter (Signed)
Douglas Andrews called back and states he found his Rx's.

## 2018-03-08 NOTE — Telephone Encounter (Signed)
Patients car was towed in and when he went to pick up car scripts were not in car. Please call with options for refills

## 2018-03-08 NOTE — Telephone Encounter (Signed)
Please indicate to the patient that we are sorry that this has happened to him. However this is out of our control and can not be replaced.  If he feels like he may go experience any withdrawal symptoms. I will be glad to prescribe Zofran for nausea and clonidine patch for the flu-like symptoms.

## 2018-03-08 NOTE — Telephone Encounter (Signed)
Voicemail left with patient to get additional information as to what happened with Rx's.  I did state on the voicemail that we do not replace lost or stolen Rx's and that possibly he should fill out a police report so that if there is any activity r/t these Rx's we could report it back to them. Ask to call back if he needed to.

## 2018-03-08 NOTE — Telephone Encounter (Signed)
Douglas Andrews called back to let me know all details.  States that his car was towed in and he thought that he had taken everything out that was important and put it all in a black bag and he thinks that this bag was Mistakenly thrown in the trash.  This was for Rx's oxycodone to begin filling on 03/10/18, 04/09/18 and 05/09/18 as well as his Gabapentin.  [name] has already run.  States he has no additional medicine and if there is anything that can be done he would appreciate it.

## 2018-04-03 ENCOUNTER — Telehealth: Payer: Self-pay | Admitting: Nurse Practitioner

## 2018-04-03 NOTE — Telephone Encounter (Signed)
Pharmacy called and documented in medication portion of patient chart about refill dates.

## 2018-04-03 NOTE — Telephone Encounter (Signed)
Patient gets blister paks for his meds which are 28 days and has accumulated an extra month of meds. They want to know if you want them to go ahead and fill a complete month or wait until he is ready to get meds again. Says patient has an appt for meds mgmt in July and could get by with only 1 script for his meds instead of 2. Please call pharmacy to verify what they need to do.  Mardelle Mattendy  [phone]

## 2018-05-09 ENCOUNTER — Ambulatory Visit: Payer: Medicare Other | Attending: Nurse Practitioner | Admitting: Nurse Practitioner

## 2018-05-16 ENCOUNTER — Encounter: Payer: Self-pay | Admitting: Nurse Practitioner

## 2018-05-16 ENCOUNTER — Ambulatory Visit: Payer: Medicare Other | Attending: Nurse Practitioner | Admitting: Nurse Practitioner

## 2018-05-16 ENCOUNTER — Other Ambulatory Visit: Payer: Self-pay

## 2018-05-16 VITALS — BP 156/113 | HR 74 | Temp 97.9°F | Ht 69.0 in | Wt 158.0 lb

## 2018-05-16 DIAGNOSIS — M1711 Unilateral primary osteoarthritis, right knee: Secondary | ICD-10-CM | POA: Diagnosis not present

## 2018-05-16 DIAGNOSIS — Z8249 Family history of ischemic heart disease and other diseases of the circulatory system: Secondary | ICD-10-CM | POA: Insufficient documentation

## 2018-05-16 DIAGNOSIS — Z833 Family history of diabetes mellitus: Secondary | ICD-10-CM | POA: Insufficient documentation

## 2018-05-16 DIAGNOSIS — G510 Bell's palsy: Secondary | ICD-10-CM | POA: Insufficient documentation

## 2018-05-16 DIAGNOSIS — G479 Sleep disorder, unspecified: Secondary | ICD-10-CM | POA: Insufficient documentation

## 2018-05-16 DIAGNOSIS — M25552 Pain in left hip: Secondary | ICD-10-CM | POA: Insufficient documentation

## 2018-05-16 DIAGNOSIS — D869 Sarcoidosis, unspecified: Secondary | ICD-10-CM | POA: Insufficient documentation

## 2018-05-16 DIAGNOSIS — F411 Generalized anxiety disorder: Secondary | ICD-10-CM | POA: Diagnosis not present

## 2018-05-16 DIAGNOSIS — M5116 Intervertebral disc disorders with radiculopathy, lumbar region: Secondary | ICD-10-CM | POA: Insufficient documentation

## 2018-05-16 DIAGNOSIS — Z79899 Other long term (current) drug therapy: Secondary | ICD-10-CM | POA: Diagnosis not present

## 2018-05-16 DIAGNOSIS — E559 Vitamin D deficiency, unspecified: Secondary | ICD-10-CM | POA: Diagnosis not present

## 2018-05-16 DIAGNOSIS — Z886 Allergy status to analgesic agent status: Secondary | ICD-10-CM | POA: Insufficient documentation

## 2018-05-16 DIAGNOSIS — D860 Sarcoidosis of lung: Secondary | ICD-10-CM | POA: Diagnosis not present

## 2018-05-16 DIAGNOSIS — I10 Essential (primary) hypertension: Secondary | ICD-10-CM | POA: Diagnosis not present

## 2018-05-16 DIAGNOSIS — L309 Dermatitis, unspecified: Secondary | ICD-10-CM | POA: Insufficient documentation

## 2018-05-16 DIAGNOSIS — Z5181 Encounter for therapeutic drug level monitoring: Secondary | ICD-10-CM | POA: Insufficient documentation

## 2018-05-16 DIAGNOSIS — G8929 Other chronic pain: Secondary | ICD-10-CM

## 2018-05-16 DIAGNOSIS — E1165 Type 2 diabetes mellitus with hyperglycemia: Secondary | ICD-10-CM | POA: Insufficient documentation

## 2018-05-16 DIAGNOSIS — E663 Overweight: Secondary | ICD-10-CM | POA: Diagnosis not present

## 2018-05-16 DIAGNOSIS — M792 Neuralgia and neuritis, unspecified: Secondary | ICD-10-CM

## 2018-05-16 DIAGNOSIS — M25562 Pain in left knee: Secondary | ICD-10-CM | POA: Diagnosis not present

## 2018-05-16 DIAGNOSIS — G4700 Insomnia, unspecified: Secondary | ICD-10-CM | POA: Insufficient documentation

## 2018-05-16 DIAGNOSIS — L03113 Cellulitis of right upper limb: Secondary | ICD-10-CM | POA: Insufficient documentation

## 2018-05-16 DIAGNOSIS — Z7982 Long term (current) use of aspirin: Secondary | ICD-10-CM | POA: Insufficient documentation

## 2018-05-16 DIAGNOSIS — K5900 Constipation, unspecified: Secondary | ICD-10-CM | POA: Insufficient documentation

## 2018-05-16 DIAGNOSIS — F329 Major depressive disorder, single episode, unspecified: Secondary | ICD-10-CM | POA: Diagnosis not present

## 2018-05-16 DIAGNOSIS — Z794 Long term (current) use of insulin: Secondary | ICD-10-CM | POA: Insufficient documentation

## 2018-05-16 DIAGNOSIS — G3184 Mild cognitive impairment, so stated: Secondary | ICD-10-CM | POA: Insufficient documentation

## 2018-05-16 DIAGNOSIS — R079 Chest pain, unspecified: Secondary | ICD-10-CM | POA: Diagnosis not present

## 2018-05-16 DIAGNOSIS — G894 Chronic pain syndrome: Secondary | ICD-10-CM

## 2018-05-16 DIAGNOSIS — G8918 Other acute postprocedural pain: Secondary | ICD-10-CM | POA: Insufficient documentation

## 2018-05-16 DIAGNOSIS — M4726 Other spondylosis with radiculopathy, lumbar region: Secondary | ICD-10-CM

## 2018-05-16 DIAGNOSIS — M170 Bilateral primary osteoarthritis of knee: Secondary | ICD-10-CM | POA: Diagnosis not present

## 2018-05-16 DIAGNOSIS — I6381 Other cerebral infarction due to occlusion or stenosis of small artery: Secondary | ICD-10-CM | POA: Insufficient documentation

## 2018-05-16 DIAGNOSIS — M25551 Pain in right hip: Secondary | ICD-10-CM | POA: Diagnosis not present

## 2018-05-16 DIAGNOSIS — M25561 Pain in right knee: Secondary | ICD-10-CM | POA: Insufficient documentation

## 2018-05-16 DIAGNOSIS — K529 Noninfective gastroenteritis and colitis, unspecified: Secondary | ICD-10-CM | POA: Insufficient documentation

## 2018-05-16 DIAGNOSIS — Z8673 Personal history of transient ischemic attack (TIA), and cerebral infarction without residual deficits: Secondary | ICD-10-CM | POA: Insufficient documentation

## 2018-05-16 DIAGNOSIS — Z823 Family history of stroke: Secondary | ICD-10-CM | POA: Insufficient documentation

## 2018-05-16 MED ORDER — OXYCODONE-ACETAMINOPHEN 10-325 MG PO TABS
1.0000 | ORAL_TABLET | Freq: Four times a day (QID) | ORAL | 0 refills | Status: AC | PRN
Start: 2018-07-08 — End: 2018-09-17

## 2018-05-16 MED ORDER — OXYCODONE-ACETAMINOPHEN 10-325 MG PO TABS
1.0000 | ORAL_TABLET | Freq: Four times a day (QID) | ORAL | 0 refills | Status: AC | PRN
Start: 2018-06-08 — End: 2018-09-17

## 2018-05-16 MED ORDER — OXYCODONE-ACETAMINOPHEN 10-325 MG PO TABS: 1.0000 | ORAL_TABLET | Freq: Four times a day (QID) | ORAL | 0 refills | Status: DC | PRN

## 2018-05-16 MED ORDER — GABAPENTIN 600 MG PO TABS: 600.0000 mg | ORAL_TABLET | Freq: Three times a day (TID) | ORAL | 0 refills | Status: DC

## 2018-05-16 NOTE — Progress Notes (Signed)
Nursing Pain Medication Assessment:  Safety precautions to be maintained throughout the outpatient stay will include: orient to surroundings, keep bed in low position, maintain call bell within reach at all times, provide assistance with transfer out of bed and ambulation.  Medication Inspection Compliance: Pill count conducted under aseptic conditions, in front of the patient. Neither the pills nor the bottle was removed from the patient's sight at any time. Once count was completed pills were immediately returned to the patient in their original bottle.  Medication: Oxycodone/APAP Pill/Patch Count: 57 of 84 pills remain Pill/Patch Appearance: Markings consistent with prescribed medication Bottle Appearance: Standard pharmacy container. Clearly labeled. Filled Date: 4 / 3530 / 2018 Last Medication intake:  Yesterday

## 2018-05-16 NOTE — Patient Instructions (Addendum)
____________________________________________________________________________________________  Medication Rules  Applies to: All patients receiving prescriptions (written or electronic).  Pharmacy of record: Pharmacy where electronic prescriptions will be sent. If written prescriptions are taken to a different pharmacy, please inform the nursing staff. The pharmacy listed in the electronic medical record should be the one where you would like electronic prescriptions to be sent.  Prescription refills: Only during scheduled appointments. Applies to both, written and electronic prescriptions.  NOTE: The following applies primarily to controlled substances (Opioid* Pain Medications).   Patient's responsibilities: 1. Pain Pills: Bring all pain pills to every appointment (except for procedure appointments). 2. Pill Bottles: Bring pills in original pharmacy bottle. Always bring newest bottle. Bring bottle, even if empty. 3. Medication refills: You are responsible for knowing and keeping track of what medications you need refilled. The day before your appointment, write a list of all prescriptions that need to be refilled. Bring that list to your appointment and give it to the admitting nurse. Prescriptions will be written only during appointments. If you forget a medication, it will not be "Called in", "Faxed", or "electronically sent". You will need to get another appointment to get these prescribed. 4. Prescription Accuracy: You are responsible for carefully inspecting your prescriptions before leaving our office. Have the discharge nurse carefully go over each prescription with you, before taking them home. Make sure that your name is accurately spelled, that your address is correct. Check the name and dose of your medication to make sure it is accurate. Check the number of pills, and the written instructions to make sure they are clear and accurate. Make sure that you are given enough medication to last  until your next medication refill appointment. 5. Taking Medication: Take medication as prescribed. Never take more pills than instructed. Never take medication more frequently than prescribed. Taking less pills or less frequently is permitted and encouraged, when it comes to controlled substances (written prescriptions).  6. Inform other Doctors: Always inform, all of your healthcare providers, of all the medications you take. 7. Pain Medication from other Providers: You are not allowed to accept any additional pain medication from any other Doctor or Healthcare provider. There are two exceptions to this rule. (see below) In the event that you require additional pain medication, you are responsible for notifying us, as stated below. 8. Medication Agreement: You are responsible for carefully reading and following our Medication Agreement. This must be signed before receiving any prescriptions from our practice. Safely store a copy of your signed Agreement. Violations to the Agreement will result in no further prescriptions. (Additional copies of our Medication Agreement are available upon request.) 9. Laws, Rules, & Regulations: All patients are expected to follow all Federal and State Laws, Statutes, Rules, & Regulations. Ignorance of the Laws does not constitute a valid excuse. The use of any illegal substances is prohibited. 10. Adopted CDC guidelines & recommendations: Target dosing levels will be at or below 60 MME/day. Use of benzodiazepines** is not recommended.  Exceptions: There are only two exceptions to the rule of not receiving pain medications from other Healthcare Providers. 1. Exception #1 (Emergencies): In the event of an emergency (i.e.: accident requiring emergency care), you are allowed to receive additional pain medication. However, you are responsible for: As soon as you are able, call our office [phone], at any time of the day or night, and leave a message stating your name, the  date and nature of the emergency, and the name and dose of the medication   prescribed. In the event that your call is answered by a member of our staff, make sure to document and save the date, time, and the name of the person that took your information.  2. Exception #2 (Planned Surgery): In the event that you are scheduled by another doctor or dentist to have any type of surgery or procedure, you are allowed (for a period no longer than 30 days), to receive additional pain medication, for the acute post-op pain. However, in this case, you are responsible for picking up a copy of our "Post-op Pain Management for Surgeons" handout, and giving it to your surgeon or dentist. This document is available at our office, and does not require an appointment to obtain it. Simply go to our office during business hours (Monday-Thursday from 8:00 AM to 4:00 PM) (Friday 8:00 AM to 12:00 Noon) or if you have a scheduled appointment with us, prior to your surgery, and ask for it by name. In addition, you will need to provide us with your name, name of your surgeon, type of surgery, and date of procedure or surgery.  *Opioid medications include: morphine, codeine, oxycodone, oxymorphone, hydrocodone, hydromorphone, meperidine, tramadol, tapentadol, buprenorphine, fentanyl, methadone. **Benzodiazepine medications include: diazepam (Valium), alprazolam (Xanax), clonazepam (Klonopine), lorazepam (Ativan), clorazepate (Tranxene), chlordiazepoxide (Librium), estazolam (Prosom), oxazepam (Serax), temazepam (Restoril), triazolam (Halcion) (Last updated: 12/07/2017) ____________________________________________________________________________________________    

## 2018-05-16 NOTE — Progress Notes (Addendum)
Patient's Name: Douglas Andrews  MRN: [id number]  Referring Provider: Fritzi Mandes, MD  DOB: January 28, 1951  PCP: Fritzi Mandes, MD  DOS: 05/16/2018  Note by: Vevelyn Francois NP  Service setting: Ambulatory outpatient  Specialty: Interventional Pain Management  Location: [facility] (AMB) Pain Management Facility    Patient type: Established    Primary Reason(s) for Visit: Encounter for prescription drug management. (Level of risk: moderate)  CC: Back Pain  HPI  Douglas Andrews is a 67 y.o. year old, male patient, who comes today for a medication management evaluation. He has Chronic low back pain (Primary Source of Pain) (Bilateral) (L>R); Chronic pain syndrome; Lumbar DDD (degenerative disc disease); Lumbar Spondylosis; Lumbar radicular pain (Left) (L5 Dermatome); Lumbar facet syndrome (Bilateral) (R>L); Opiate use (60 MME/Day); Uncomplicated opioid dependence ([place]); Long term prescription opiate use; Chronic knee pain (Tertiary source of pain) (Bilateral) (Right); Vitamin D insufficiency; Overweight; Bell palsy; CN (constipation); Dermatitis, eczematoid; Diabetes mellitus ([place]); Dysmetria; GAD (generalized anxiety disorder); Insomnia due to medical condition; Long term current use of systemic steroids; Poorly controlled type 2 diabetes mellitus ([place]); Pulmonary sarcoidosis ([place]); Arthropathy, traumatic, shoulder; Bursitis, trochanteric; Varices, esophageal ([place]); Leg varices; Hepatic cirrhosis ([place]); Major depressive disorder, single episode; Other long term (current) drug therapy; At risk for falling; Encounter for chronic pain management; Encounter for therapeutic drug level monitoring; Chronic lumbar radicular pain (Left) (L5 Dermatome); Lumbar Levoscoliosis; Neurogenic pain; Osteoarthritis of knee (Right); At high risk for falls; Chest pain; Skin lesion; Pain of left femur (left femoral rod due to sarcoidosis); Chronic hip pain (Right); Osteoarthritis of knee (Bilateral) (R>L); Essential hypertension;  Folliculitis; Chronic sacroiliac joint pain (Left); Chronic hip pain (Left); Chronic thoracic back pain (Left); Diarrhea; Numbness on left side; Gastroenteritis; Other fatigue; Urinary incontinence without sensory awareness; Balanitis; Generalized abdominal pain; Hyperglycemic crisis in diabetes mellitus ([place]); Osteoarthritis of lumbar spine; Chronic lower extremity pain (Secondary Area of Pain) (Bilateral) (L>R); Retrolisthesis (2 mm) of L4 over L5; Anterolisthesis (7 mm) L5 over S1; Lumbar facet arthropathy (L4-5 and L5-S1) (Bilateral); Lumbar facet osteoarthritis (Bilateral); History of lacunar cerebrovascular accident; MCI (mild cognitive impairment); Acute postoperative pain; Right-sided lacunar infarction [facility]); Spondylosis without myelopathy or radiculopathy, lumbosacral region; Cellulitis of right upper extremity; On statin therapy; Sleep disturbance; Diabetes mellitus with complication ([place]); and Type 2 or unspecified type diabetes mellitus on their problem list. His primarily concern today is the Back Pain  Pain Assessment: Location: Lower Back Radiating: Denies Onset: More than a month ago Duration: Chronic pain Quality: Aching, Burning, Constant Severity: 4 /10 (subjective, self-reported pain score)  Note: Reported level is compatible with observation.                          Effect on ADL: limits my daily activities Timing: Constant Modifying factors: Medication, ice pack, and heat BP: (!) 156/113  HR: 74  Douglas Andrews was last scheduled for an appointment on 05/09/2018 for medication management. During today's appointment we reviewed Mr. Bringhurst chronic pain status, as well as his outpatient medication regimen. He admits that his pain stable. He states that he did suffer a fall a few weeks ago. He states that he was going up steps and his right knee gave out. He admits that this happens on occasion. He states that he is doing better now. He denies any additional concerns to day. He  denies any side effects of his medication.   The patient  reports that he does not use drugs.  His body mass index is 23.33 kg/m.  Further details on both, my assessment(s), as well as the proposed treatment plan, please see below.  Controlled Substance Pharmacotherapy Assessment REMS (Risk Evaluation and Mitigation Strategy)  Analgesic:Oxycodone/APAP 10/325 one every 6 hours (40 mg/day) MME/day:60 mg/day    Douglas Fischer, RN  05/16/2018 11:44 AM  Signed Nursing Pain Medication Assessment:  Safety precautions to be maintained throughout the outpatient stay will include: orient to surroundings, keep bed in low position, maintain call bell within reach at all times, provide assistance with transfer out of bed and ambulation.  Medication Inspection Compliance: Pill count conducted under aseptic conditions, in front of the patient. Neither the pills nor the bottle was removed from the patient's sight at any time. Once count was completed pills were immediately returned to the patient in their original bottle.  Medication: Oxycodone/APAP Pill/Patch Count: 57 of 84 pills remain Pill/Patch Appearance: Markings consistent with prescribed medication Bottle Appearance: Standard pharmacy container. Clearly labeled. Filled Date: 4 / 79 / 2018 Last Medication intake:  Yesterday   Pharmacokinetics: Liberation and absorption (onset of action): WNL Distribution (time to peak effect): WNL Metabolism and excretion (duration of action): WNL         Pharmacodynamics: Desired effects: Analgesia: Douglas Andrews reports >50% benefit. Functional ability: Patient reports that medication allows him to accomplish basic ADLs Clinically meaningful improvement in function (CMIF): Sustained CMIF goals met Perceived effectiveness: Described as relatively effective, allowing for increase in activities of daily living (ADL) Undesirable effects: Side-effects or Adverse reactions: None reported Monitoring: [place] PMP:  Online review of the past 30-monthperiod conducted. Compliant with practice rules and regulations Last UDS on record: Summary  Date Value Ref Range Status  12/06/2017 FINAL  Final    Comment:    ==================================================================== TOXASSURE SELECT 13 (MW) ==================================================================== Test                             Result       Flag       Units Drug Present and Declared for Prescription Verification   Noroxycodone                   773          EXPECTED   ng/mg creat    Noroxycodone is an expected metabolite of oxycodone. Sources of    oxycodone include scheduled prescription medications. Drug Present not Declared for Prescription Verification   7-aminoclonazepam              63           UNEXPECTED ng/mg creat    7-aminoclonazepam is an expected metabolite of clonazepam. Source    of clonazepam is a scheduled prescription medication. Drug Absent but Declared for Prescription Verification   Oxycodone                      Not Detected UNEXPECTED ng/mg creat    Oxycodone is almost always present in patients taking this drug    consistently.  Absence of oxycodone could be due to lapse of time    since the last dose or unusual pharmacokinetics (rapid    metabolism). ==================================================================== Test                      Result    Flag   Units      Ref Range   Creatinine  41               mg/dL      >=20 ==================================================================== Declared Medications:  The flagging and interpretation on this report are based on the  following declared medications.  Unexpected results may arise from  inaccuracies in the declared medications.  **Note: The testing scope of this panel includes these medications:  Oxycodone (Percocet)  **Note: The testing scope of this panel does not include following  reported medications:  Acetaminophen  (Percocet)  Albuterol  Aspirin (Aspirin 81)  Atorvastatin (Lipitor)  Cetirizine (Zyrtec)  Clotrimazole (Lotrimin)  Gabapentin (Neurontin)  Insulin (Humalog)  Insulin (Lantus)  Isosorbide (Imdur)  Liraglutide (Victoza)  Lisinopril  Metformin  Nystatin  Vitamin D3 ==================================================================== For clinical consultation, please call [phone]. ====================================================================    UDS interpretation: Compliant          Medication Assessment Form: Reviewed. Patient indicates being compliant with therapy Treatment compliance: Compliant Risk Assessment Profile: Aberrant behavior: See prior evaluations. None observed or detected today Comorbid factors increasing risk of overdose: See prior notes. No additional risks detected today Risk of substance use disorder (SUD): Low  ORT Scoring interpretation table:  Score <3 = Low Risk for SUD  Score between 4-7 = Moderate Risk for SUD  Score >8 = High Risk for Opioid Abuse   Risk Mitigation Strategies:  Patient Counseling: Covered Patient-Prescriber Agreement (PPA): Present and active  Notification to other healthcare providers: Done  Pharmacologic Plan: No change in therapy, at this time.             Laboratory Chemistry  Inflammation Markers (CRP: Acute Phase) (ESR: Chronic Phase) Lab Results  Component Value Date   CRP <0.5 03/30/2016   ESRSEDRATE 1 03/30/2016                         Rheumatology Markers No results found for: RF, ANA, LABURIC, URICUR, LYMEIGGIGMAB, LYMEABIGMQN, HLAB27                      Renal Function Markers Lab Results  Component Value Date   BUN 12 03/30/2016   CREATININE 0.85 03/30/2016   GFRAA >60 03/30/2016   GFRNONAA >60 03/30/2016                             Hepatic Function Markers Lab Results  Component Value Date   AST 24 03/30/2016   ALT 27 03/30/2016   ALBUMIN 4.6 03/30/2016   ALKPHOS 63 03/30/2016                         Electrolytes Lab Results  Component Value Date   NA 136 03/30/2016   K 3.8 03/30/2016   CL 100 (L) 03/30/2016   CALCIUM 9.2 03/30/2016   MG 2.2 03/30/2016                        Neuropathy Markers Lab Results  Component Value Date   VITAMINB12 539 03/30/2016                        Bone Pathology Markers Lab Results  Component Value Date   25OHVITD1 38 03/30/2016   25OHVITD2 3.4 03/30/2016   25OHVITD3 35 03/30/2016  Coagulation Parameters No results found for: INR, LABPROT, APTT, PLT, DDIMER                      Cardiovascular Markers No results found for: BNP, CKTOTAL, CKMB, TROPONINI, HGB, HCT                       CA Markers No results found for: CEA, CA125, LABCA2                      Note: Lab results reviewed.  Recent Diagnostic Imaging Results  DG C-Arm 1-60 Min-No Report Fluoroscopy was utilized by the requesting physician.  No radiographic  interpretation.   Complexity Note: Imaging results reviewed. Results shared with [name], using Layman's terms.                         Meds   Current Outpatient Medications:  .  albuterol (PROAIR HFA) 108 (90 Base) MCG/ACT inhaler, INHALE 2 PUFFS BY MOUTH EVERY 4 HOURS ASNEEDED FOR WHEEZING, Disp: , Rfl:  .  aspirin EC 81 MG tablet, Take 81 mg by mouth., Disp: , Rfl:  .  atorvastatin (LIPITOR) 40 MG tablet, Take 40 mg by mouth daily., Disp: , Rfl:  .  cetirizine (ZYRTEC) 10 MG tablet, Take 10 mg by mouth daily. , Disp: , Rfl:  .  Cholecalciferol (VITAMIN D3) 5000 units TABS, Take 1 tablet by mouth daily. , Disp: , Rfl:  .  glucose blood (ACCU-CHEK ACTIVE STRIPS) test strip, Checks 3 times daily, Disp: , Rfl:  .  insulin glargine (LANTUS) 100 UNIT/ML injection, Inject 60 Units into the skin at bedtime. , Disp: , Rfl:  .  insulin lispro (HUMALOG) 100 UNIT/ML injection, Inject 18 Units into the skin 3 (three) times daily with meals., Disp: , Rfl:  .  Insulin Pen Needle  (UNIFINE PENTIPS) 31G X 6 MM MISC, , Disp: , Rfl:  .  Insulin Pen Needle (UNIFINE PENTIPS) 31G X 6 MM MISC, USE AS DIRECTED WITH LANTUS & NOVOLOG, Disp: , Rfl:  .  isosorbide mononitrate (IMDUR) 30 MG 24 hr tablet, 30 mg daily. , Disp: , Rfl:  .  lisinopril (PRINIVIL,ZESTRIL) 10 MG tablet, Take 10 mg by mouth daily. , Disp: , Rfl:  .  metFORMIN (GLUCOPHAGE) 500 MG tablet, Take 1,000 mg by mouth 2 (two) times daily with a meal., Disp: , Rfl:  .  [START ON 08/07/2018] oxyCODONE-acetaminophen (PERCOCET) 10-325 MG tablet, Take 1 tablet by mouth every 6 (six) hours as needed for pain., Disp: 120 tablet, Rfl: 0 .  VICTOZA 18 MG/3ML SOPN, Inject into the skin 3 (three) times daily. , Disp: , Rfl:  .  albuterol (PROVENTIL) (5 MG/ML) 0.5% nebulizer solution, Take 2.5 mg by nebulization every 6 (six) hours as needed for wheezing or shortness of breath. , Disp: , Rfl:  .  [START ON 06/08/2018] gabapentin (NEURONTIN) 600 MG tablet, Take 1 tablet (600 mg total) by mouth every 8 (eight) hours., Disp: 270 tablet, Rfl: 0 .  [START ON 07/08/2018] oxyCODONE-acetaminophen (PERCOCET) 10-325 MG tablet, Take 1 tablet by mouth every 6 (six) hours as needed for pain., Disp: 120 tablet, Rfl: 0 .  [START ON 06/08/2018] oxyCODONE-acetaminophen (PERCOCET) 10-325 MG tablet, Take 1 tablet by mouth every 6 (six) hours as needed for pain., Disp: 120 tablet, Rfl: 0  ROS  Constitutional: Denies any fever or chills Gastrointestinal: No reported hemesis,  hematochezia, vomiting, or acute GI distress Musculoskeletal: Denies any acute onset joint swelling, redness, loss of ROM, or weakness Neurological: No reported episodes of acute onset apraxia, aphasia, dysarthria, agnosia, amnesia, paralysis, loss of coordination, or loss of consciousness  Allergies  Mr. Abdallah is allergic to sertraline.  [place]  Drug: Mr. Teuscher  reports that he does not use drugs. Alcohol:  reports that he does not drink alcohol. Tobacco:  reports that he has  never smoked. He has never used smokeless tobacco. Medical:  has a past medical history of Chronic back pain, Diabetes mellitus without complication ([place]), Hyperlipidemia, Hypertension, Left leg pain (01/02/2014), Right leg pain (07/15/2015), and Sarcoidosis. Surgical: Mr. Salinger  has a past surgical history that includes Appendectomy; Tonsillectomy; and left rod. Family: family history includes Diabetes in his father; Heart disease in his father and mother; Stroke in his father.  Constitutional Exam  General appearance: Well nourished, well developed, and well hydrated. In no apparent acute distress Vitals:   05/16/18 0959  BP: (!) 156/113  Pulse: 74  Temp: 97.9 F (36.6 C)  SpO2: 100%  Weight: 158 lb (71.7 kg)  Height: _0  (1.753 m)  Psych/Mental status: Alert, oriented x 3 (person, place, & time)       Eyes: PERLA Respiratory: No evidence of acute respiratory distress  Lumbar Spine Area Exam  Skin & Axial Inspection: No masses, redness, or swelling Alignment: Symmetrical Functional ROM: Adequate ROM       Stability: No instability detected Muscle Tone/Strength: Functionally intact. No obvious neuro-muscular anomalies detected. Sensory (Neurological): Unimpaired Palpation: Complains of area being tender to palpation       Provocative Tests: Hyperextension/rotation test: deferred today       Lumbar quadrant test (Kemp's test): deferred today       Lateral bending test: deferred today          Gait & Posture Assessment  Ambulation: Unassisted Gait: Relatively normal for age and body habitus Posture: WNL   Lower Extremity Exam    Side: Right lower extremity  Side: Left lower extremity  Stability: No instability observed          Stability: No instability observed          Skin & Extremity Inspection: Edema  Skin & Extremity Inspection: Skin color, temperature, and hair growth are WNL. No peripheral edema or cyanosis. No masses, redness, swelling, asymmetry, or associated  skin lesions. No contractures.  Functional ROM: Adequate ROM for all joints of the lower extremity          Functional ROM: Adequate ROM for all joints of the lower extremity          Muscle Tone/Strength: Functionally intact. No obvious neuro-muscular anomalies detected.  Muscle Tone/Strength: Functionally intact. No obvious neuro-muscular anomalies detected.  Sensory (Neurological): Unimpaired  Sensory (Neurological): Unimpaired  Palpation: No palpable anomalies  Palpation: No palpable anomalies   Assessment  Primary Diagnosis & Pertinent Problem List: The primary encounter diagnosis was Lumbar Spondylosis. Diagnoses of Chronic hip pain (Right), Primary osteoarthritis of right knee, Chronic pain syndrome, and Neurogenic pain were also pertinent to this visit.  Status Diagnosis  Stable Stable Stable 1. Lumbar Spondylosis   2. Chronic hip pain (Right)   3. Primary osteoarthritis of right knee   4. Chronic pain syndrome   5. Neurogenic pain     Problems updated and reviewed during this visit: Problem  Essential Hypertension   Last Assessment & Plan:  Formatting of this note may be  different from the original. Today's BP: 131/78 mmHg is at goal below 140/90. Hypertension is asymptomatic and reasonably well controlled. Pt is tolerating current medication(s) well. Medication: continue current medication at same dose.  Lisinopril 10 mg QD Plan: Orders and follow up as documented in patient record.  Lab Results  Component Value Date   NA 136 02/17/2016   NA 134* 01/20/2015   K 4.5 02/17/2016   K 4.3 01/20/2015   CREATININE 1.0 03/23/2016   CREATININE 0.87 02/17/2016   CREATININE 0.84 01/20/2015   BP Readings from Last 4 Encounters:  04/21/16 131/78  04/20/16 131/79  04/07/16 148/91  03/21/16 125/75   Last Assessment & Plan:  Formatting of this note may be different from the original. Today's BP: 142/86 is not at goal below 130/80 - has been taking NyQuil. Marland Kitchen Hypertension  is borderline controlled.  Pt is tolerating current medication(s) well. Medication: continue current medicaiton(s) at same dose.  Lisinopril 10 mg daily Plan: Current treatment plan is effective, no change in therapy. Lab Results  Component Value Date   NA 133 (L) 08/20/2017   NA 134 (L) 01/20/2015   K 4.5 08/20/2017   K 4.3 01/20/2015   CREATININE 0.71 08/20/2017   CREATININE 0.84 01/20/2015   BP Readings from Last 4 Encounters:  11/07/17 142/86  10/27/17 133/84  10/25/17 132/90  09/13/17 136/88   Last Assessment & Plan:  Formatting of this note might be different from the original. Today's BP: 118/79 is at goal below 130/80. Hypertension is reasonably well controlled.  Pt is tolerating current medication(s) well. Medication: continue current medicaiton(s) at same dose.  Lisinopril 10 mg daily Plan: Current treatment plan is effective, no change in therapy. Lab Results  Component Value Date   NA 133 (L) 08/20/2017   NA 134 (L) 01/20/2015   K 4.5 08/20/2017   K 4.3 01/20/2015   CREATININE 0.71 08/20/2017   CREATININE 0.84 01/20/2015   BP Readings from Last 4 Encounters:  02/01/18 118/79  01/02/18 114/80  11/07/17 142/86  10/27/17 133/84  Last Assessment & Plan:  Formatting of this note might be different from the original. Today's BP: 122/80 is at goal below 130/80. Hypertension is reasonably well controlled.  Pt is tolerating current medication(s) well. Medication: continue current medicaiton(s) at same dose.  Lisinopril 10 mg daily Plan: Current treatment plan is effective, no change in therapy. Lab Results  Component Value Date   NA 133 (L) 02/11/2018   NA 134 (L) 01/20/2015   K 4.4 02/11/2018   K 4.3 01/20/2015   CREATININE 0.84 02/11/2018   CREATININE 0.84 01/20/2015   BP Readings from Last 4 Encounters:  04/09/18 122/80  03/27/18 117/83  03/08/18 129/85  02/11/18 149/82   Cellulitis of Right Upper Extremity   Last Assessment & Plan:  Pt was  scratched by dog while playing last week.  Left forearm excoriation/laceration with surrounding erythema and warmth.  Begin taking cephalexin 500 mg BID for 7 days.  Last Assessment & Plan:  Abrasion due to fall; worsening surrounding erythema and pain.  Pt is high-risk - will start treatment with augmentin 875-125 mg BID for 7 days.   Type 2 Or Unspecified Type Diabetes Mellitus   Last Assessment & Plan:  Formatting of this note might be different from the original. Diabetes isn't at goal of A1C <8.0, likely falsely elevated due to recent high sugar load to compensate for hypoglycemia, cellulitis, social/housing instability and subsequent stress, and recent CSI.  Moderate suspicion for  diabetic gastroparesis; continue to follow with GI. Lab Results  Component Value Date   A1C 10.4 (A) 04/09/2018   A1C 10.7 (A) 03/08/2018   A1C 10.4 (A) 02/01/2018   A1C 9.9 (H) 11/08/2016   A1C 8.9 (H) 02/13/2014   A1C 7.9 11/06/2012   A1C 7.5 08/03/2012   A1C 7.8 04/09/2012   MICROALBQTUR <0.6 10/28/2014   Pt is on ASA, statin and ACEi/ARB. Pt is tolerating current medication(s) well. Medication: continue current medicaiton(s) at same dose.   Humalog 10 TID AC - recommend using TID vs daily dose as before  Lantus 60u qhs and 30u qAM Metformin 1000 mg BID  Victoza 1.8 mg daily  Plan: Orders and follow up as documented in patient record.  Health maintenance: Last foot exam: 07/17/17 Last retinal exam: 10/30/17 Last micro albumin: 02/13/17 Last Assessment & Plan:  Formatting of this note might be different from the original. Diabetes isn't at goal of A1C <8.0. Likely worsened due to increased fast food and soda intake. Moderate suspicion for diabetic gastroparesis; continue to follow with GI. Lab Results  Component Value Date   A1C 10.7 (A) 03/08/2018   A1C 10.4 (A) 02/01/2018   A1C 9.6 (A) 01/02/2018   A1C 9.9 (H) 11/08/2016   A1C 8.9 (H) 02/13/2014   A1C 7.9 11/06/2012   A1C 7.5  08/03/2012   A1C 7.8 04/09/2012   MICROALBQTUR <0.6 10/28/2014   Pt is on ASA, statin and ACEi/ARB. Pt is tolerating current medication(s) well. Medication: continue current medicaiton(s) at same dose.   Humalog 12U with breakfast, 14U with lunch and dinner  Lantus 60 U QHS add 30U QAM Metformin 1000 mg BID  Victoza 1.8 mg daily  Plan: Orders and follow up as documented in patient record.  Discussed medication regimen with pt and provided written instructions.  If increasing lantus fails, will consider sulfanuria.  Health maintenance: Last foot exam: 07/17/17 Last retinal exam: 10/30/17 Last micro albumin: 02/13/17    Plan of Care  Pharmacotherapy (Medications Ordered): Meds ordered this encounter  Medications  . oxyCODONE-acetaminophen (PERCOCET) 10-325 MG tablet    Sig: Take 1 tablet by mouth every 6 (six) hours as needed for pain.    Dispense:  120 tablet    Refill:  0    Do not place this medication, or any other prescription from our practice, on "Automatic Refill". Patient may have prescription filled one day early if pharmacy is closed on scheduled refill date. Do not fill until:08/07/2018 To last until:09/06/2018    Order Specific Question:   Supervising Provider    Answer:   Milinda Pointer [phone]  . oxyCODONE-acetaminophen (PERCOCET) 10-325 MG tablet    Sig: Take 1 tablet by mouth every 6 (six) hours as needed for pain.    Dispense:  120 tablet    Refill:  0    Do not place this medication, or any other prescription from our practice, on "Automatic Refill". Patient may have prescription filled one day early if pharmacy is closed on scheduled refill date. Do not fill until:07/08/2018 To last until:08/07/2018    Order Specific Question:   Supervising Provider    Answer:   Milinda Pointer [phone]  . oxyCODONE-acetaminophen (PERCOCET) 10-325 MG tablet    Sig: Take 1 tablet by mouth every 6 (six) hours as needed for pain.    Dispense:  120 tablet    Refill:   0    Do not place this medication, or any other prescription from our practice, on "Automatic  Refill". Patient may have prescription filled one day early if pharmacy is closed on scheduled refill date. Do not fill until: 06/08/2018 To last until:07/08/2018    Order Specific Question:   Supervising Provider    Answer:   Milinda Pointer [phone]  . gabapentin (NEURONTIN) 600 MG tablet    Sig: Take 1 tablet (600 mg total) by mouth every 8 (eight) hours.    Dispense:  270 tablet    Refill:  0    Do not place this medication, or any other prescription from our practice, on "Automatic Refill". Patient may have prescription filled one day early if pharmacy is closed on scheduled refill date.    Order Specific Question:   Supervising Provider    Answer:   Milinda Pointer [[id number]]   New Prescriptions   No medications on file   Medications administered today: Yassen Kinnett had no medications administered during this visit. Lab-work, procedure(s), and/or referral(s): No orders of the defined types were placed in this encounter.  Imaging and/or referral(s): None  Interventional management options: Planned, scheduled, and/or pending: Not at this time.    Considering: Diagnostic bilateral Lumbar facet block Possible bilateral lumbar facet RFA (left side first)  Diagnostic right intra-articular hip injection Possible radiofrequency of the right hipjoint Diagnostic Right L4-5 lumbar epidural steroid injection Diagnostic bilateral intra-articular knee injection Possible series of 5 Hyalgan knee injections.  Diagnostic bilateral Genicular nerve block Possible bilateral Genicular nerve radiofrequencyablation   Palliative PRN treatment(s): Diagnostic bilateral Lumbar facet block #2 Diagnostic right intra-articular hip injection Diagnostic Right L4-5 lumbar epidural steroid injection Diagnostic bilateral intra-articular knee injection Possible series of 5 Hyalgan knee  injections.  Diagnostic bilateral Genicular nerve block     Provider-requested follow-up: Return in about 3 months (around 08/16/2018) for MedMgmt with Me Donella Stade [facility]).  Future Appointments  Date Time Provider [place]  08/16/2018  8:45 AM Vevelyn Francois, NP [facility] None   Primary Care Physician: Fritzi Mandes, MD Location: [facility] Note by: Vevelyn Francois NP Date: 05/16/2018; Time: 11:45 AM  Pain Score Disclaimer: We use the NRS-11 scale. This is a self-reported, subjective measurement of pain severity with only modest accuracy. It is used primarily to identify changes within a particular patient. It must be understood that outpatient pain scales are significantly less accurate that those used for research, where they can be applied under ideal controlled circumstances with minimal exposure to variables. In reality, the score is likely to be a combination of pain intensity and pain affect, where pain affect describes the degree of emotional arousal or changes in action readiness caused by the sensory experience of pain. Factors such as social and work situation, setting, emotional state, anxiety levels, expectation, and prior pain experience may influence pain perception and show large inter-individual differences that may also be affected by time variables.  Patient instructions provided during this appointment: Patient Instructions  ____________________________________________________________________________________________  Medication Rules  Applies to: All patients receiving prescriptions (written or electronic).  Pharmacy of record: Pharmacy where electronic prescriptions will be sent. If written prescriptions are taken to a different pharmacy, please inform the nursing staff. The pharmacy listed in the electronic medical record should be the one where you would like electronic prescriptions to be sent.  Prescription refills: Only  during scheduled appointments. Applies to both, written and electronic prescriptions.  NOTE: The following applies primarily to controlled substances (Opioid* Pain Medications).   Patient's responsibilities: 1. Pain Pills: Bring all pain pills to every  appointment (except for procedure appointments). 2. Pill Bottles: Bring pills in original pharmacy bottle. Always bring newest bottle. Bring bottle, even if empty. 3. Medication refills: You are responsible for knowing and keeping track of what medications you need refilled. The day before your appointment, write a list of all prescriptions that need to be refilled. Bring that list to your appointment and give it to the admitting nurse. Prescriptions will be written only during appointments. If you forget a medication, it will not be "Called in", "Faxed", or "electronically sent". You will need to get another appointment to get these prescribed. 4. Prescription Accuracy: You are responsible for carefully inspecting your prescriptions before leaving our office. Have the discharge nurse carefully go over each prescription with you, before taking them home. Make sure that your name is accurately spelled, that your address is correct. Check the name and dose of your medication to make sure it is accurate. Check the number of pills, and the written instructions to make sure they are clear and accurate. Make sure that you are given enough medication to last until your next medication refill appointment. 5. Taking Medication: Take medication as prescribed. Never take more pills than instructed. Never take medication more frequently than prescribed. Taking less pills or less frequently is permitted and encouraged, when it comes to controlled substances (written prescriptions).  6. Inform other Doctors: Always inform, all of your healthcare providers, of all the medications you take. 7. Pain Medication from other Providers: You are not allowed to accept any  additional pain medication from any other Doctor or Healthcare provider. There are two exceptions to this rule. (see below) In the event that you require additional pain medication, you are responsible for notifying us, as stated below. 8. Medication Agreement: You are responsible for carefully reading and following our Medication Agreement. This must be signed before receiving any prescriptions from our practice. Safely store a copy of your signed Agreement. Violations to the Agreement will result in no further prescriptions. (Additional copies of our Medication Agreement are available upon request.) 9. Laws, Rules, & Regulations: All patients are expected to follow all Federal and [employer], Rules, [employer]. Ignorance of the Laws does not constitute a valid excuse. The use of any illegal substances is prohibited. 10. Adopted CDC guidelines & recommendations: Target dosing levels will be at or below 60 MME/day. Use of benzodiazepines** is not recommended.  Exceptions: There are only two exceptions to the rule of not receiving pain medications from other Healthcare Providers. 1. Exception #1 (Emergencies): In the event of an emergency (i.e.: accident requiring emergency care), you are allowed to receive additional pain medication. However, you are responsible for: As soon as you are able, call our office [phone], at any time of the day or night, and leave a message stating your name, the date and nature of the emergency, and the name and dose of the medication prescribed. In the event that your call is answered by a member of our staff, make sure to document and save the date, time, and the name of the person that took your information.  2. Exception #2 (Planned Surgery): In the event that you are scheduled by another doctor or dentist to have any type of surgery or procedure, you are allowed (for a period no longer than 30 days), to receive additional pain medication, for the acute  post-op pain. However, in this case, you are responsible for picking up a copy of our "Post-op Pain Management for  Surgeons" handout, and giving it to your surgeon or dentist. This document is available at our office, and does not require an appointment to obtain it. Simply go to our office during business hours (Monday-Thursday from 8:00 AM to 4:00 PM) (Friday 8:00 AM to 12:00 Noon) or if you have a scheduled appointment with [place], prior to your surgery, and ask for it by name. In addition, you will need to provide [place] with your name, name of your surgeon, type of surgery, and date of procedure or surgery.  *Opioid medications include: morphine, codeine, oxycodone, oxymorphone, hydrocodone, hydromorphone, meperidine, tramadol, tapentadol, buprenorphine, fentanyl, methadone. **Benzodiazepine medications include: diazepam (Valium), alprazolam (Xanax), clonazepam (Klonopine), lorazepam (Ativan), clorazepate (Tranxene), chlordiazepoxide (Librium), estazolam (Prosom), oxazepam (Serax), temazepam (Restoril), triazolam (Halcion) (Last updated: 12/07/2017) ____________________________________________________________________________________________

## 2018-06-05 ENCOUNTER — Other Ambulatory Visit: Payer: Self-pay | Admitting: Nurse Practitioner

## 2018-06-05 DIAGNOSIS — M792 Neuralgia and neuritis, unspecified: Secondary | ICD-10-CM

## 2018-06-07 ENCOUNTER — Telehealth: Payer: Self-pay | Admitting: Pain Medicine

## 2018-06-07 NOTE — Telephone Encounter (Signed)
Wants to come in to see Dr. Laban EmperorNaveira, his back pain is flaring up.

## 2018-06-12 ENCOUNTER — Encounter: Payer: Self-pay | Admitting: Pain Medicine

## 2018-06-12 NOTE — Progress Notes (Signed)
Patient's Name: Douglas Andrews  MRN: [id number]  Referring Provider: Fritzi Mandes, MD  DOB: 04/12/1951  PCP: Douglas Mandes, MD  DOS: 06/13/2018  Note by: Douglas Cola, MD  Service setting: Ambulatory outpatient  Specialty: Interventional Pain Management  Location: [facility] (AMB) Pain Management Facility    Patient type: Established   Primary Reason(s) for Visit: Evaluation of chronic illnesses with exacerbation, or progression (Level of risk: moderate) CC: Back Pain (lower)  HPI  [name] is a 67 y.o. year old, male patient, who comes today for a follow-up evaluation. He has Chronic low back pain (Primary Source of Pain) (Bilateral) (L>R); Chronic pain syndrome; Lumbar DDD (degenerative disc disease); Lumbar Spondylosis; Lumbar radicular pain (Left) (L5 Dermatome); Lumbar facet syndrome (Bilateral) (R>L); Opiate use (60 MME/Day); Uncomplicated opioid dependence ([place]); Long term prescription opiate use; Chronic knee pain (Tertiary source of pain) (Bilateral) (Right); Vitamin D insufficiency; Overweight; Bell palsy; CN (constipation); Dermatitis, eczematoid; Diabetes mellitus ([place]); Dysmetria; GAD (generalized anxiety disorder); Insomnia due to medical condition; Long term current use of systemic steroids; Poorly controlled type 2 diabetes mellitus ([place]); Pulmonary sarcoidosis ([place]); Arthropathy, traumatic, shoulder; Bursitis, trochanteric; Varices, esophageal ([place]); Leg varices; Hepatic cirrhosis ([place]); Major depressive disorder, single episode; Other long term (current) drug therapy; At risk for falling; Encounter for chronic pain management; Encounter for therapeutic drug level monitoring; Chronic lumbar radicular pain (Left) (L5 Dermatome); Lumbar Levoscoliosis; Neurogenic pain; Osteoarthritis of knee (Right); At high risk for falls; Chest pain; Skin lesion; Pain of left femur (left femoral rod due to sarcoidosis); Chronic hip pain (Right); Osteoarthritis of knee (Bilateral) (R>L); Essential  hypertension; Folliculitis; Chronic sacroiliac joint pain (Left); Chronic hip pain (Left); Chronic thoracic back pain (Left); Diarrhea; Numbness on left side; Gastroenteritis; Other fatigue; Urinary incontinence without sensory awareness; Balanitis; Generalized abdominal pain; Hyperglycemic crisis in diabetes mellitus ([place]); Osteoarthritis of lumbar spine; Chronic lower extremity pain (Secondary Area of Pain) (Bilateral) (L>R); Retrolisthesis (2 mm) of L4 over L5; Anterolisthesis (7 mm) L5 over S1; Lumbar facet arthropathy (L4-5 and L5-S1) (Bilateral); Lumbar facet osteoarthritis (Bilateral); History of lacunar cerebrovascular accident; MCI (mild cognitive impairment); Right-sided lacunar infarction [facility]); Spondylosis without myelopathy or radiculopathy, lumbosacral region; Cellulitis of right upper extremity; On statin therapy; Sleep disturbance; Diabetes mellitus with complication ([place]); and Type II diabetes mellitus ([place]) on their problem list. [name] was last seen on 06/07/2018. His primarily concern today is the Back Pain (lower)  Pain Assessment: Location: Lower Back Radiating: denies Onset: More than a month ago Duration: Chronic pain Quality: Aching Severity: 7 /10 (subjective, self-reported pain score)  Note: Reported level is inconsistent with clinical observations. Clinically the patient looks like a 4/10 A 4/10 is viewed as "Moderately Severe" and described as impossible to ignore for more than a few minutes. With effort, patients may still be able to manage work or participate in some social activities. Very difficult to concentrate. Signs of autonomic nervous system discharge are evident: dilated pupils (mydriasis); mild sweating (diaphoresis); sleep interference. Heart rate becomes elevated (>115 bpm). Diastolic blood pressure (lower number) rises above 100 mmHg. Patients find relief in laying down and not moving. [name] continues to use a standard subjective pain scale, rather than an  objective pain scale as instructed. When using our objective Pain Scale, levels between 6 and 10/10 are said to belong in an emergency room, as it progressively worsens from a 6/10, described as severely limiting, requiring emergency care not usually available at an outpatient pain management facility. At a 6/10  level, communication becomes difficult and requires great effort. Assistance to reach the emergency department may be required. Facial flushing and profuse sweating along with potentially dangerous increases in heart rate and blood pressure will be evident. Timing: Constant Modifying factors: medications BP: (!) 129/95  HR: 73  Further details on both, my assessment(s), as well as the proposed treatment plan, please see below.  Laboratory Chemistry  Inflammation Markers (CRP: Acute Phase) (ESR: Chronic Phase) Lab Results  Component Value Date   CRP <0.5 03/30/2016   ESRSEDRATE 1 03/30/2016                         Renal Function Markers Lab Results  Component Value Date   BUN 12 03/30/2016   CREATININE 0.85 03/30/2016   GFRAA >60 03/30/2016   GFRNONAA >60 03/30/2016                             Hepatic Function Markers Lab Results  Component Value Date   AST 24 03/30/2016   ALT 27 03/30/2016   ALBUMIN 4.6 03/30/2016   ALKPHOS 63 03/30/2016                        Electrolytes Lab Results  Component Value Date   NA 136 03/30/2016   K 3.8 03/30/2016   CL 100 (L) 03/30/2016   CALCIUM 9.2 03/30/2016   MG 2.2 03/30/2016                        Neuropathy Markers Lab Results  Component Value Date   VITAMINB12 539 03/30/2016                        Bone Pathology Markers Lab Results  Component Value Date   25OHVITD1 38 03/30/2016   25OHVITD2 3.4 03/30/2016   25OHVITD3 35 03/30/2016                         Note: Lab results reviewed.  Recent Diagnostic Imaging Review  Thoracic Imaging: Thoracic DG 2-3 views:  Results for orders placed during the hospital  encounter of 08/22/16  DG Thoracic Spine 2 View   Narrative CLINICAL DATA:  Dorsalgia with several recent falls  EXAM: THORACIC SPINE 3 VIEWS  COMPARISON:  None.  FINDINGS: Frontal, lateral, and swimmer's views were obtained. There is mild lower thoracic dextroscoliosis. There is no evident fracture or spondylolisthesis. There is disc space narrowing at multiple levels in thoracic spine, primarily in the lower thoracic region. No erosive change or paraspinous lesion.  IMPRESSION: Mild scoliosis. Osteoarthritic change at several levels. No acute fracture or spondylolisthesis.   Electronically Signed   By: Lowella Grip III M.D.   On: 08/22/2016 10:35    Lumbosacral Imaging: Lumbar MR wo contrast:  Results for orders placed during the hospital encounter of 06/22/17  MR LUMBAR SPINE WO CONTRAST   Narrative CLINICAL DATA:  Chronic low back pain  EXAM: MRI LUMBAR SPINE WITHOUT CONTRAST  TECHNIQUE: Multiplanar, multisequence MR imaging of the lumbar spine was performed. No intravenous contrast was administered.  COMPARISON:  None.  FINDINGS: Segmentation:  Standard.  Alignment:  Physiologic.  Vertebrae:  No fracture, evidence of discitis, or bone lesion.  Conus medullaris: Extends to the L1 level and appears normal.  Paraspinal and other soft tissues: No paraspinal abnormality.  Disc levels:  Disc spaces: Disc desiccation at L3-4, L4-5 and L5-S1. Disc height loss at L5-S1.  T12-L1: No significant disc bulge. No evidence of neural foraminal stenosis. No central canal stenosis.  L1-L2: No significant disc bulge. No evidence of neural foraminal stenosis. No central canal stenosis.  L2-L3: No significant disc bulge. No evidence of neural foraminal stenosis. No central canal stenosis.  L3-L4: Broad-based disc bulge. No evidence of neural foraminal stenosis. No central canal stenosis.  L4-L5: Mild broad-based disc bulge. Mild bilateral facet arthropathy.  No evidence of neural foraminal stenosis. No central canal stenosis.  L5-S1: Mild broad-based disc bulge so tiny central disc protrusions. Mild bilateral facet arthropathy. No evidence of neural foraminal stenosis. No central canal stenosis.  IMPRESSION: 1. At L5-S1 there is a mild broad-based disc bulge so tiny central disc protrusions. Mild bilateral facet arthropathy. 2. At L4-5 there is a mild broad-based disc bulge. Mild bilateral facet arthropathy.   Electronically Signed   By: Kathreen Devoid   On: 06/22/2017 11:14    Lumbar DG (Complete) 4+V:  Results for orders placed during the hospital encounter of 08/22/16  DG Lumbar Spine Complete   Narrative CLINICAL DATA:  Lumbago with several recent falls  EXAM: LUMBAR SPINE - COMPLETE 4+ VIEW  COMPARISON:  None.  FINDINGS: Frontal, lateral, spot lumbosacral lateral, and bilateral oblique views were obtained. There are 5 non-rib-bearing lumbar type vertebral bodies. T12 ribs are hypoplastic. There is mild lumbar levoscoliosis. There is no fracture. There is 2 mm of retrolisthesis of L4 on L5. There is 7 mm of anterolisthesis of L5 on S1. There is no other evident spondylolisthesis. There is an unfused apophysis along the anterior superior aspect of the L3 vertebral body. Pars defects are noted at L5 bilaterally. There is mild disc space narrowing at L4-5 and L5-S1. There is facet osteoarthritic change at L4-5 and L5-S1 bilaterally.  IMPRESSION: Port defects at L5 bilaterally with spondylolisthesis at L4-5 and L5-S1. No fracture. Osteoarthritic change at L4-5 and L5-S1.   Electronically Signed   By: Lowella Grip III M.D.   On: 08/22/2016 10:33    Lumbar DG Bending views:  Results for orders placed during the hospital encounter of 06/22/17  DG Lumbar Spine Complete W/Bend   Narrative CLINICAL DATA:  Back pain.  Pain radiates down legs.  EXAM: LUMBAR SPINE - COMPLETE WITH BENDING VIEWS  COMPARISON:   08/22/2016.  FINDINGS: Lumbar spine scoliosis concave left. No acute bony abnormality identified. No flexion or extension deformity noted. Stable mild grade 1 spondylolisthesis L5-S1. Aortoiliac atherosclerotic vascular calcification .  IMPRESSION: 1. Stable mild grade 1 spondylolisthesis L5-S1. No flexion or extension deformity noted.  2. Diffuse degenerative change with mild scoliosis concave right. No acute bony abnormality identified.  3. Aortoiliac atherosclerotic vascular disease.   Electronically Signed   By: Marcello Moores  Register   On: 06/22/2017 09:53    Sacroiliac Joint Imaging: Sacroiliac Joint DG:  Results for orders placed during the hospital encounter of 08/22/16  DG Si Joints   Narrative CLINICAL DATA:  Pain with several recent falls  EXAM: BILATERAL SACROILIAC JOINTS - 3+ VIEW  COMPARISON:  None.  FINDINGS: Frontal as well as bilateral oblique views were obtained. There is mild osteoarthritic change in each sacroiliac joint. No erosive change. No fracture or diastases.  IMPRESSION: No fracture or diastases. Mild osteoarthritic change in each sacroiliac joint. No sacroiliitis evident.   Electronically Signed   By: Lowella Grip III M.D.   On: 08/22/2016 10:34  Hip Imaging: Hip-L DG 2-3 views:  Results for orders placed during the hospital encounter of 08/22/16  DG HIP UNILAT W OR W/O PELVIS 2-3 VIEWS LEFT   Narrative CLINICAL DATA:  Pain with several recent falls  EXAM: DG HIP (WITH OR WITHOUT PELVIS) 2-3V LEFT  COMPARISON:  None.  FINDINGS: Frontal pelvis as well as frontal and lateral left hip images were obtained. There is postoperative change on the left. There is myositis ossifications superior to the intertrochanteric region on the left. No acute fracture or dislocation is evident. There is slight symmetric narrowing both hip joints. No erosive change. There is atherosclerotic calcification in both superficial  femoral arteries.  IMPRESSION: Postoperative change on the left. Mild symmetric narrowing of both hip joints. No acute fracture or dislocation. Superficial femoral artery atherosclerotic change.   Electronically Signed   By: Lowella Grip III M.D.   On: 08/22/2016 10:36    Complexity Note: Imaging results reviewed. Results shared with Mr. Apollo, using Layman's terms.                         Meds   Current Outpatient Medications:  .  albuterol (PROAIR HFA) 108 (90 Base) MCG/ACT inhaler, INHALE 2 PUFFS BY MOUTH EVERY 4 HOURS ASNEEDED FOR WHEEZING, Disp: , Rfl:  .  aspirin EC 81 MG tablet, Take 81 mg by mouth., Disp: , Rfl:  .  atorvastatin (LIPITOR) 40 MG tablet, Take 40 mg by mouth daily., Disp: , Rfl:  .  cetirizine (ZYRTEC) 10 MG tablet, Take 10 mg by mouth daily. , Disp: , Rfl:  .  Cholecalciferol (VITAMIN D3) 5000 units TABS, Take 1 tablet by mouth daily. , Disp: , Rfl:  .  gabapentin (NEURONTIN) 600 MG tablet, Take 1 tablet (600 mg total) by mouth every 8 (eight) hours., Disp: 270 tablet, Rfl: 0 .  glucose blood (ACCU-CHEK ACTIVE STRIPS) test strip, Checks 3 times daily, Disp: , Rfl:  .  insulin glargine (LANTUS) 100 UNIT/ML injection, Inject 65 Units into the skin at bedtime. , Disp: , Rfl:  .  insulin lispro (HUMALOG) 100 UNIT/ML injection, Inject 18 Units into the skin 3 (three) times daily with meals., Disp: , Rfl:  .  Insulin Pen Needle (UNIFINE PENTIPS) 31G X 6 MM MISC, , Disp: , Rfl:  .  Insulin Pen Needle (UNIFINE PENTIPS) 31G X 6 MM MISC, USE AS DIRECTED WITH LANTUS & NOVOLOG, Disp: , Rfl:  .  isosorbide mononitrate (IMDUR) 30 MG 24 hr tablet, 30 mg daily. , Disp: , Rfl:  .  lisinopril (PRINIVIL,ZESTRIL) 10 MG tablet, Take 10 mg by mouth daily. , Disp: , Rfl:  .  metFORMIN (GLUCOPHAGE) 500 MG tablet, Take 1,000 mg by mouth 2 (two) times daily with a meal., Disp: , Rfl:  .  [START ON 08/07/2018] oxyCODONE-acetaminophen (PERCOCET) 10-325 MG tablet, Take 1 tablet by  mouth every 6 (six) hours as needed for pain., Disp: 120 tablet, Rfl: 0 .  [START ON 07/08/2018] oxyCODONE-acetaminophen (PERCOCET) 10-325 MG tablet, Take 1 tablet by mouth every 6 (six) hours as needed for pain., Disp: 120 tablet, Rfl: 0 .  oxyCODONE-acetaminophen (PERCOCET) 10-325 MG tablet, Take 1 tablet by mouth every 6 (six) hours as needed for pain., Disp: 120 tablet, Rfl: 0 .  VICTOZA 18 MG/3ML SOPN, Inject into the skin 3 (three) times daily. , Disp: , Rfl:  .  albuterol (PROVENTIL) (5 MG/ML) 0.5% nebulizer solution, Take 2.5 mg by nebulization every 6 (  six) hours as needed for wheezing or shortness of breath. , Disp: , Rfl:   ROS  Constitutional: Denies any fever or chills Gastrointestinal: No reported hemesis, hematochezia, vomiting, or acute GI distress Musculoskeletal: Denies any acute onset joint swelling, redness, loss of ROM, or weakness Neurological: No reported episodes of acute onset apraxia, aphasia, dysarthria, agnosia, amnesia, paralysis, loss of coordination, or loss of consciousness  Allergies  Mr. Stender is allergic to sertraline.  [place]  Drug: Mr. Jaroszewski  reports that he does not use drugs. Alcohol:  reports that he does not drink alcohol. Tobacco:  reports that he has never smoked. He has never used smokeless tobacco. Medical:  has a past medical history of Acute postoperative pain (09/14/2017), Chronic back pain, Diabetes mellitus without complication ([place]), Hyperlipidemia, Hypertension, Left leg pain (01/02/2014), Right leg pain (07/15/2015), and Sarcoidosis. Surgical: Mr. Sookram  has a past surgical history that includes Appendectomy; Tonsillectomy; and left rod. Family: family history includes Diabetes in his father; Heart disease in his father and mother; Stroke in his father.  Constitutional Exam  General appearance: Well nourished, well developed, and well hydrated. In no apparent acute distress Vitals:   06/13/18 0927  BP: (!) 129/95  Pulse: 73  Resp: 16   Temp: 98.3 F (36.8 C)  TempSrc: Oral  SpO2: 100%  Weight: 169 lb (76.7 kg)  Height: '5\' 8"'$  (1.727 m)   BMI Assessment: Estimated body mass index is 25.7 kg/m as calculated from the following:   Height as of this encounter: '5\' 8"'$  (1.727 m).   Weight as of this encounter: 169 lb (76.7 kg).  BMI interpretation table: BMI level Category Range association with higher incidence of chronic pain  <18 kg/m2 Underweight   18.5-24.9 kg/m2 Ideal body weight   25-29.9 kg/m2 Overweight Increased incidence by 20%  30-34.9 kg/m2 Obese (Class I) Increased incidence by 68%  35-39.9 kg/m2 Severe obesity (Class II) Increased incidence by 136%  >40 kg/m2 Extreme obesity (Class III) Increased incidence by 254%   Patient's current BMI Ideal Body weight  Body mass index is 25.7 kg/m. Ideal body weight: 68.4 kg (150 lb 12.7 oz) Adjusted ideal body weight: 71.7 kg (158 lb 1.2 oz)   BMI Readings from Last 4 Encounters:  06/13/18 25.70 kg/m  05/16/18 23.33 kg/m  02/08/18 24.12 kg/m  12/28/17 23.26 kg/m   Wt Readings from Last 4 Encounters:  06/13/18 169 lb (76.7 kg)  05/16/18 158 lb (71.7 kg)  02/08/18 161 lb (73 kg)  12/28/17 153 lb (69.4 kg)  Psych/Mental status: Alert, oriented x 3 (person, place, & time)       Eyes: PERLA Respiratory: No evidence of acute respiratory distress  Cervical Spine Area Exam  Skin & Axial Inspection: No masses, redness, edema, swelling, or associated skin lesions Alignment: Symmetrical Functional ROM: Unrestricted ROM      Stability: No instability detected Muscle Tone/Strength: Functionally intact. No obvious neuro-muscular anomalies detected. Sensory (Neurological): Unimpaired Palpation: No palpable anomalies              Upper Extremity (UE) Exam    Side: Right upper extremity  Side: Left upper extremity  Skin & Extremity Inspection: Skin color, temperature, and hair growth are WNL. No peripheral edema or cyanosis. No masses, redness, swelling,  asymmetry, or associated skin lesions. No contractures.  Skin & Extremity Inspection: Skin color, temperature, and hair growth are WNL. No peripheral edema or cyanosis. No masses, redness, swelling, asymmetry, or associated skin lesions. No contractures.  Functional ROM:  Unrestricted ROM          Functional ROM: Unrestricted ROM          Muscle Tone/Strength: Functionally intact. No obvious neuro-muscular anomalies detected.  Muscle Tone/Strength: Functionally intact. No obvious neuro-muscular anomalies detected.  Sensory (Neurological): Unimpaired          Sensory (Neurological): Unimpaired          Palpation: No palpable anomalies              Palpation: No palpable anomalies              Provocative Test(s):  Phalen's test: deferred Tinel's test: deferred Apley's scratch test (touch opposite shoulder):  Action 1 (Across chest): deferred Action 2 (Overhead): deferred Action 3 (LB reach): deferred   Provocative Test(s):  Phalen's test: deferred Tinel's test: deferred Apley's scratch test (touch opposite shoulder):  Action 1 (Across chest): deferred Action 2 (Overhead): deferred Action 3 (LB reach): deferred    Thoracic Spine Area Exam  Skin & Axial Inspection: No masses, redness, or swelling Alignment: Symmetrical Functional ROM: Unrestricted ROM Stability: No instability detected Muscle Tone/Strength: Functionally intact. No obvious neuro-muscular anomalies detected. Sensory (Neurological): Unimpaired Muscle strength & Tone: No palpable anomalies  Lumbar Spine Area Exam  Skin & Axial Inspection: No masses, redness, or swelling Alignment: Symmetrical Functional ROM: Decreased ROM       Stability: No instability detected Muscle Tone/Strength: Functionally intact. No obvious neuro-muscular anomalies detected. Sensory (Neurological): Movement-associated pain Palpation: Complains of area being tender to palpation       Provocative Tests: Hyperextension/rotation test: (+)  bilaterally for facet joint pain. Lumbar quadrant test (Kemp's test): (+) bilaterally for facet joint pain. Lateral bending test: deferred today       Patrick's Maneuver: deferred today                   FABER test: deferred today                   S-I anterior distraction/compression test: deferred today         S-I lateral compression test: deferred today         S-I Thigh-thrust test: deferred today         S-I Gaenslen's test: deferred today          Gait & Posture Assessment  Ambulation: Patient ambulates using a cane Gait: Limited. Using assistive device to ambulate Posture: Antalgic   Lower Extremity Exam    Side: Right lower extremity  Side: Left lower extremity  Stability: No instability observed          Stability: No instability observed          Skin & Extremity Inspection: Skin color, temperature, and hair growth are WNL. No peripheral edema or cyanosis. No masses, redness, swelling, asymmetry, or associated skin lesions. No contractures.  Skin & Extremity Inspection: Skin color, temperature, and hair growth are WNL. No peripheral edema or cyanosis. No masses, redness, swelling, asymmetry, or associated skin lesions. No contractures.  Functional ROM: Unrestricted ROM                  Functional ROM: Unrestricted ROM                  Muscle Tone/Strength: Functionally intact. No obvious neuro-muscular anomalies detected.  Muscle Tone/Strength: Functionally intact. No obvious neuro-muscular anomalies detected.  Sensory (Neurological): Unimpaired  Sensory (Neurological): Unimpaired  Palpation: No palpable anomalies  Palpation: No palpable  anomalies   Assessment  Primary Diagnosis & Pertinent Problem List: The primary encounter diagnosis was Chronic low back pain (Primary Source of Pain) (Bilateral) (L>R). Diagnoses of Lumbar facet syndrome (Bilateral) (R>L), Lumbar facet osteoarthritis (Bilateral), Lumbar facet arthropathy (L4-5 and L5-S1) (Bilateral), and Spondylosis without  myelopathy or radiculopathy, lumbosacral region were also pertinent to this visit.  Status Diagnosis  Recurring Recurring Stable 1. Chronic low back pain (Primary Source of Pain) (Bilateral) (L>R)   2. Lumbar facet syndrome (Bilateral) (R>L)   3. Lumbar facet osteoarthritis (Bilateral)   4. Lumbar facet arthropathy (L4-5 and L5-S1) (Bilateral)   5. Spondylosis without myelopathy or radiculopathy, lumbosacral region     Problems updated and reviewed during this visit: Problem  Essential Hypertension   Last Assessment & Plan:  Formatting of this note may be different from the original. Today's BP: 131/78 mmHg is at goal below 140/90. Hypertension is asymptomatic and reasonably well controlled. Pt is tolerating current medication(s) well. Medication: continue current medication at same dose.  Lisinopril 10 mg QD Plan: Orders and follow up as documented in patient record.  Lab Results  Component Value Date   NA 136 02/17/2016   NA 134* 01/20/2015   K 4.5 02/17/2016   K 4.3 01/20/2015   CREATININE 1.0 03/23/2016   CREATININE 0.87 02/17/2016   CREATININE 0.84 01/20/2015   BP Readings from Last 4 Encounters:  04/21/16 131/78  04/20/16 131/79  04/07/16 148/91  03/21/16 125/75   Last Assessment & Plan:  Formatting of this note may be different from the original. Today's BP: 142/86 is not at goal below 130/80 - has been taking NyQuil. Marland Kitchen Hypertension is borderline controlled.  Pt is tolerating current medication(s) well. Medication: continue current medicaiton(s) at same dose.  Lisinopril 10 mg daily Plan: Current treatment plan is effective, no change in therapy. Lab Results  Component Value Date   NA 133 (L) 08/20/2017   NA 134 (L) 01/20/2015   K 4.5 08/20/2017   K 4.3 01/20/2015   CREATININE 0.71 08/20/2017   CREATININE 0.84 01/20/2015   BP Readings from Last 4 Encounters:  11/07/17 142/86  10/27/17 133/84  10/25/17 132/90  09/13/17 136/88   Last Assessment &  Plan:  Formatting of this note might be different from the original. Today's BP: 118/79 is at goal below 130/80. Hypertension is reasonably well controlled.  Pt is tolerating current medication(s) well. Medication: continue current medicaiton(s) at same dose.  Lisinopril 10 mg daily Plan: Current treatment plan is effective, no change in therapy. Lab Results  Component Value Date   NA 133 (L) 08/20/2017   NA 134 (L) 01/20/2015   K 4.5 08/20/2017   K 4.3 01/20/2015   CREATININE 0.71 08/20/2017   CREATININE 0.84 01/20/2015   BP Readings from Last 4 Encounters:  02/01/18 118/79  01/02/18 114/80  11/07/17 142/86  10/27/17 133/84  Last Assessment & Plan:  Formatting of this note might be different from the original. Today's BP: 122/80 is at goal below 130/80. Hypertension is reasonably well controlled.  Pt is tolerating current medication(s) well. Medication: continue current medicaiton(s) at same dose.  Lisinopril 10 mg daily Plan: Current treatment plan is effective, no change in therapy. Lab Results  Component Value Date   NA 133 (L) 02/11/2018   NA 134 (L) 01/20/2015   K 4.4 02/11/2018   K 4.3 01/20/2015   CREATININE 0.84 02/11/2018   CREATININE 0.84 01/20/2015   BP Readings from Last 4 Encounters:  04/09/18 122/80  03/27/18  117/83  03/08/18 129/85  02/11/18 149/82  Last Assessment & Plan:  Formatting of this note might be different from the original. Borderline controlled Currently taking lisinopril '10mg'$  daily and imdur '30mg'$  daily  BP Readings from Last 3 Encounters:  05/21/18 139/89  04/09/18 122/80  03/27/18 117/83   Type II Diabetes Mellitus (Hcc)   Last Assessment & Plan:  Formatting of this note might be different from the original. Diabetes isn't at goal of A1C <8.0, likely falsely elevated due to recent high sugar load to compensate for hypoglycemia, cellulitis, social/housing instability and subsequent stress, and recent CSI.  Moderate suspicion  for diabetic gastroparesis; continue to follow with GI. Lab Results  Component Value Date   A1C 10.4 (A) 04/09/2018   A1C 10.7 (A) 03/08/2018   A1C 10.4 (A) 02/01/2018   A1C 9.9 (H) 11/08/2016   A1C 8.9 (H) 02/13/2014   A1C 7.9 11/06/2012   A1C 7.5 08/03/2012   A1C 7.8 04/09/2012   MICROALBQTUR <0.6 10/28/2014   Pt is on ASA, statin and ACEi/ARB. Pt is tolerating current medication(s) well. Medication: continue current medicaiton(s) at same dose.   Humalog 10 TID AC - recommend using TID vs daily dose as before  Lantus 60u qhs and 30u qAM Metformin 1000 mg BID  Victoza 1.8 mg daily  Plan: Orders and follow up as documented in patient record.  Health maintenance: Last foot exam: 07/17/17 Last retinal exam: 10/30/17 Last micro albumin: 02/13/17 Last Assessment & Plan:  Formatting of this note might be different from the original. Diabetes isn't at goal of A1C <8.0. Likely worsened due to increased fast food and soda intake. Moderate suspicion for diabetic gastroparesis; continue to follow with GI. Lab Results  Component Value Date   A1C 10.7 (A) 03/08/2018   A1C 10.4 (A) 02/01/2018   A1C 9.6 (A) 01/02/2018   A1C 9.9 (H) 11/08/2016   A1C 8.9 (H) 02/13/2014   A1C 7.9 11/06/2012   A1C 7.5 08/03/2012   A1C 7.8 04/09/2012   MICROALBQTUR <0.6 10/28/2014   Pt is on ASA, statin and ACEi/ARB. Pt is tolerating current medication(s) well. Medication: continue current medicaiton(s) at same dose.   Humalog 12U with breakfast, 14U with lunch and dinner  Lantus 60 U QHS add 30U QAM Metformin 1000 mg BID  Victoza 1.8 mg daily  Plan: Orders and follow up as documented in patient record.  Discussed medication regimen with pt and provided written instructions.  If increasing lantus fails, will consider sulfanuria.  Health maintenance: Last foot exam: 07/17/17 Last retinal exam: 10/30/17 Last micro albumin: 02/13/17  Last Assessment & Plan:  Formatting of this note might be  different from the original. Diabetes isn't at goal of A1C <8.0,  Asked to bring BG logs and meter to f/u visit Following with our RD Moderate suspicion for diabetic gastroparesis; continue to follow with GI. Lab Results  Component Value Date   A1C 9.6 (A) 05/21/2018   A1C 10.4 (A) 04/09/2018   A1C 10.7 (A) 03/08/2018   A1C 9.9 (H) 11/08/2016   A1C 8.9 (H) 02/13/2014   A1C 7.9 11/06/2012   A1C 7.5 08/03/2012   A1C 7.8 04/09/2012   MICROALBQTUR <0.6 10/28/2014   Pt is on ASA, statin and ACEi/ARB. Pt is tolerating current medication(s) well. Medication: continue current medicaiton(s) at same dose.   Humalog 10 TID AC - recommend using TID vs daily dose as before  INCREASE Lantus 30u to 35u qAM and 60 to 65u qhs. Discussed notifying us with  hypoglycemia episodes  Metformin 1000 mg BID  Victoza 1.8 mg daily  Plan: Orders and follow up as documented in patient record.  Health maintenance: Last foot exam: 07/17/17 Last retinal exam: 10/30/17 Last micro albumin: 02/13/17    Plan of Care  Pharmacotherapy (Medications Ordered): No orders of the defined types were placed in this encounter.  Medications administered today: Crit Obremski had no medications administered during this visit.   Procedure Orders     LUMBAR FACET(MEDIAL BRANCH NERVE BLOCK) MB[facility] Lab Orders  No laboratory test(s) ordered today   Imaging Orders  No imaging studies ordered today   Referral Orders  No referral(s) requested today    Interventional management options: Planned, scheduled, and/or pending:   Diagnostic bilateral lumbar facet block under fluoroscopic guidance, no sedation. At this point, we will be testing whether or not his prior radiofrequency has worn off. If he has, then we'll plan on repeating it. He indicates that after the radiofrequency he obtained excellent relief of his pain.   Considering:   Diagnostic bilateral Lumbar facet block Possible bilateral lumbar facet RFA (left side  first)  Diagnostic right intra-articular hip injection Possible radiofrequency of the right hipjoint Diagnostic Right L4-5 lumbar epidural steroid injection Diagnostic bilateral intra-articular knee injection Possible series of 5 Hyalgan knee injections.  Diagnostic bilateral Genicular nerve block Possible bilateral Genicular nerve radiofrequencyablation   Palliative PRN treatment(s):   Diagnostic bilateral Lumbar facet block #2 Diagnostic right intra-articular hip injection Diagnostic Right L4-5 lumbar epidural steroid injection Diagnostic bilateral intra-articular knee injection Possible series of 5 Hyalgan knee injections.  Diagnostic bilateral Genicular nerve block   Provider-requested follow-up: Return for Procedure (no sedation): (B) L-FCT BLK.  Future Appointments  Date Time Provider [place]  06/21/2018  9:15 AM Milinda Pointer, MD [facility] None  08/16/2018  8:45 AM Vevelyn Francois, NP [facility] None   Primary Care Physician: Douglas Mandes, MD Location: [facility] Note by: Douglas Cola, MD Date: 06/13/2018; Time: 1:18 PM

## 2018-06-13 ENCOUNTER — Other Ambulatory Visit: Payer: Self-pay

## 2018-06-13 ENCOUNTER — Ambulatory Visit: Payer: Medicare Other | Attending: Pain Medicine | Admitting: Pain Medicine

## 2018-06-13 ENCOUNTER — Encounter: Payer: Self-pay | Admitting: Pain Medicine

## 2018-06-13 VITALS — BP 129/95 | HR 73 | Temp 98.3°F | Resp 16 | Ht 68.0 in | Wt 169.0 lb

## 2018-06-13 DIAGNOSIS — M5124 Other intervertebral disc displacement, thoracic region: Secondary | ICD-10-CM | POA: Insufficient documentation

## 2018-06-13 DIAGNOSIS — M4726 Other spondylosis with radiculopathy, lumbar region: Secondary | ICD-10-CM | POA: Diagnosis not present

## 2018-06-13 DIAGNOSIS — M47817 Spondylosis without myelopathy or radiculopathy, lumbosacral region: Secondary | ICD-10-CM

## 2018-06-13 DIAGNOSIS — G8929 Other chronic pain: Secondary | ICD-10-CM | POA: Diagnosis not present

## 2018-06-13 DIAGNOSIS — Z794 Long term (current) use of insulin: Secondary | ICD-10-CM | POA: Insufficient documentation

## 2018-06-13 DIAGNOSIS — M5116 Intervertebral disc disorders with radiculopathy, lumbar region: Secondary | ICD-10-CM | POA: Diagnosis not present

## 2018-06-13 DIAGNOSIS — M5441 Lumbago with sciatica, right side: Secondary | ICD-10-CM | POA: Diagnosis not present

## 2018-06-13 DIAGNOSIS — M25552 Pain in left hip: Secondary | ICD-10-CM | POA: Diagnosis not present

## 2018-06-13 DIAGNOSIS — G894 Chronic pain syndrome: Secondary | ICD-10-CM | POA: Insufficient documentation

## 2018-06-13 DIAGNOSIS — Z8673 Personal history of transient ischemic attack (TIA), and cerebral infarction without residual deficits: Secondary | ICD-10-CM | POA: Insufficient documentation

## 2018-06-13 DIAGNOSIS — Z7982 Long term (current) use of aspirin: Secondary | ICD-10-CM | POA: Insufficient documentation

## 2018-06-13 DIAGNOSIS — K5900 Constipation, unspecified: Secondary | ICD-10-CM | POA: Diagnosis not present

## 2018-06-13 DIAGNOSIS — M170 Bilateral primary osteoarthritis of knee: Secondary | ICD-10-CM | POA: Diagnosis not present

## 2018-06-13 DIAGNOSIS — L03113 Cellulitis of right upper limb: Secondary | ICD-10-CM | POA: Diagnosis not present

## 2018-06-13 DIAGNOSIS — M546 Pain in thoracic spine: Secondary | ICD-10-CM | POA: Diagnosis not present

## 2018-06-13 DIAGNOSIS — M25551 Pain in right hip: Secondary | ICD-10-CM | POA: Diagnosis not present

## 2018-06-13 DIAGNOSIS — M25562 Pain in left knee: Secondary | ICD-10-CM | POA: Insufficient documentation

## 2018-06-13 DIAGNOSIS — I10 Essential (primary) hypertension: Secondary | ICD-10-CM | POA: Insufficient documentation

## 2018-06-13 DIAGNOSIS — M545 Low back pain: Secondary | ICD-10-CM | POA: Insufficient documentation

## 2018-06-13 DIAGNOSIS — N3942 Incontinence without sensory awareness: Secondary | ICD-10-CM | POA: Insufficient documentation

## 2018-06-13 DIAGNOSIS — M25561 Pain in right knee: Secondary | ICD-10-CM | POA: Insufficient documentation

## 2018-06-13 DIAGNOSIS — M47816 Spondylosis without myelopathy or radiculopathy, lumbar region: Secondary | ICD-10-CM | POA: Diagnosis not present

## 2018-06-13 DIAGNOSIS — Z79891 Long term (current) use of opiate analgesic: Secondary | ICD-10-CM | POA: Diagnosis not present

## 2018-06-13 DIAGNOSIS — M533 Sacrococcygeal disorders, not elsewhere classified: Secondary | ICD-10-CM | POA: Insufficient documentation

## 2018-06-13 DIAGNOSIS — K529 Noninfective gastroenteritis and colitis, unspecified: Secondary | ICD-10-CM | POA: Diagnosis not present

## 2018-06-13 DIAGNOSIS — F411 Generalized anxiety disorder: Secondary | ICD-10-CM | POA: Insufficient documentation

## 2018-06-13 DIAGNOSIS — R079 Chest pain, unspecified: Secondary | ICD-10-CM | POA: Diagnosis not present

## 2018-06-13 DIAGNOSIS — Z79899 Other long term (current) drug therapy: Secondary | ICD-10-CM | POA: Insufficient documentation

## 2018-06-13 DIAGNOSIS — E1165 Type 2 diabetes mellitus with hyperglycemia: Secondary | ICD-10-CM | POA: Insufficient documentation

## 2018-06-13 DIAGNOSIS — E785 Hyperlipidemia, unspecified: Secondary | ICD-10-CM | POA: Insufficient documentation

## 2018-06-13 NOTE — Patient Instructions (Addendum)
____________________________________________________________________________________________  Preparing for Procedure with Sedation  Instructions: . Oral Intake: Do not eat or drink anything for at least 8 hours prior to your procedure. . Transportation: Public transportation is not allowed. Bring an adult driver. The driver must be physically present in our waiting room before any procedure can be started. . Physical Assistance: Bring an adult physically capable of assisting you, in the event you need help. This adult should keep you company at home for at least 6 hours after the procedure. . Blood Pressure Medicine: Take your blood pressure medicine with a sip of water the morning of the procedure. . Blood thinners: Notify our staff if you are taking any blood thinners. Depending on which one you take, there will be specific instructions on how and when to stop it. . Diabetics on insulin: Notify the staff so that you can be scheduled 1st case in the morning. If your diabetes requires high dose insulin, take only  of your normal insulin dose the morning of the procedure and notify the staff that you have done so. . Preventing infections: Shower with an antibacterial soap the morning of your procedure. . Build-up your immune system: Take 1000 mg of Vitamin C with every meal (3 times a day) the day prior to your procedure. . Antibiotics: Inform the staff if you have a condition or reason that requires you to take antibiotics before dental procedures. . Pregnancy: If you are pregnant, call and cancel the procedure. . Sickness: If you have a cold, fever, or any active infections, call and cancel the procedure. . Arrival: You must be in the facility at least 30 minutes prior to your scheduled procedure. . Children: Do not bring children with you. . Dress appropriately: Bring dark clothing that you would not mind if they get stained. . Valuables: Do not bring any jewelry or valuables.  Procedure  appointments are reserved for interventional treatments only. . No Prescription Refills. . No medication changes will be discussed during procedure appointments. . No disability issues will be discussed.  Reasons to call and reschedule or cancel your procedure: (Following these recommendations will minimize the risk of a serious complication.) . Surgeries: Avoid having procedures within 2 weeks of any surgery. (Avoid for 2 weeks before or after any surgery). . Flu Shots: Avoid having procedures within 2 weeks of a flu shots or . (Avoid for 2 weeks before or after immunizations). . Barium: Avoid having a procedure within 7-10 days after having had a radiological study involving the use of radiological contrast. (Myelograms, Barium swallow or enema study). . Heart attacks: Avoid any elective procedures or surgeries for the initial 6 months after a "Myocardial Infarction" (Heart Attack). . Blood thinners: It is imperative that you stop these medications before procedures. Let us know if you if you take any blood thinner.  . Infection: Avoid procedures during or within two weeks of an infection (including chest colds or gastrointestinal problems). Symptoms associated with infections include: Localized redness, fever, chills, night sweats or profuse sweating, burning sensation when voiding, cough, congestion, stuffiness, runny nose, sore throat, diarrhea, nausea, vomiting, cold or Flu symptoms, recent or current infections. It is specially important if the infection is over the area that we intend to treat. . Heart and lung problems: Symptoms that may suggest an active cardiopulmonary problem include: cough, chest pain, breathing difficulties or shortness of breath, dizziness, ankle swelling, uncontrolled high or unusually low blood pressure, and/or palpitations. If you are experiencing any of these symptoms, cancel   your procedure and contact your primary care physician for an evaluation.  Remember:   Regular Business hours are:  Monday to Thursday 8:00 AM to 4:00 PM  Provider's Schedule: Abrham Maslowski, MD:  Procedure days: Tuesday and Thursday 7:30 AM to 4:00 PM  Bilal Lateef, MD:  Procedure days: Monday and Wednesday 7:30 AM to 4:00 PM ____________________________________________________________________________________________   Preparing for Procedure with Sedation Instructions: . Oral Intake: Do not eat or drink anything for at least 8 hours prior to your procedure. . Transportation: Public transportation is not allowed. Bring an adult driver. The driver must be physically present in our waiting room before any procedure can be started. . Physical Assistance: Bring an adult capable of physically assisting you, in the event you need help. . Blood Pressure Medicine: Take your blood pressure medicine with a sip of water the morning of the procedure. . Insulin: Take only  of your normal insulin dose. . Preventing infections: Shower with an antibacterial soap the morning of your procedure. . Build-up your immune system: Take 1000 mg of Vitamin C with every meal (3 times a day) the day prior to your procedure. . Pregnancy: If you are pregnant, call and cancel the procedure. . Sickness: If you have a cold, fever, or any active infections, call and cancel the procedure. . Arrival: You must be in the facility at least 30 minutes prior to your scheduled procedure. . Children: Do not bring children with you. . Dress appropriately: Bring dark clothing that you would not mind if they get stained. . Valuables: Do not bring any jewelry or valuables. Procedure appointments are reserved for interventional treatments only. . No Prescription Refills. . No medication changes will be discussed during procedure appointments. . No disability issues will be discussed. 

## 2018-06-13 NOTE — Progress Notes (Signed)
Safety precautions to be maintained throughout the outpatient stay will include: orient to surroundings, keep bed in low position, maintain call bell within reach at all times, provide assistance with transfer out of bed and ambulation.  

## 2018-06-21 ENCOUNTER — Ambulatory Visit (HOSPITAL_BASED_OUTPATIENT_CLINIC_OR_DEPARTMENT_OTHER): Payer: Medicare Other | Admitting: Pain Medicine

## 2018-06-21 ENCOUNTER — Encounter: Payer: Self-pay | Admitting: Pain Medicine

## 2018-06-21 ENCOUNTER — Ambulatory Visit
Admission: RE | Admit: 2018-06-21 | Discharge: 2018-06-21 | Disposition: A | Discharge status: Home / Self Care | Payer: Medicare Other | Source: Ambulatory Visit | Attending: Pain Medicine | Admitting: Pain Medicine

## 2018-06-21 ENCOUNTER — Other Ambulatory Visit: Payer: Self-pay

## 2018-06-21 VITALS — BP 132/90 | HR 70 | Temp 98.7°F | Resp 15 | Ht 68.0 in | Wt 160.0 lb

## 2018-06-21 DIAGNOSIS — M5441 Lumbago with sciatica, right side: Secondary | ICD-10-CM | POA: Insufficient documentation

## 2018-06-21 DIAGNOSIS — M5136 Other intervertebral disc degeneration, lumbar region: Secondary | ICD-10-CM | POA: Diagnosis not present

## 2018-06-21 DIAGNOSIS — M47816 Spondylosis without myelopathy or radiculopathy, lumbar region: Secondary | ICD-10-CM

## 2018-06-21 DIAGNOSIS — M47897 Other spondylosis, lumbosacral region: Secondary | ICD-10-CM | POA: Diagnosis not present

## 2018-06-21 DIAGNOSIS — M47817 Spondylosis without myelopathy or radiculopathy, lumbosacral region: Secondary | ICD-10-CM | POA: Insufficient documentation

## 2018-06-21 DIAGNOSIS — G8929 Other chronic pain: Secondary | ICD-10-CM | POA: Insufficient documentation

## 2018-06-21 DIAGNOSIS — M961 Postlaminectomy syndrome, not elsewhere classified: Secondary | ICD-10-CM | POA: Insufficient documentation

## 2018-06-21 MED ORDER — FENTANYL CITRATE (PF) 100 MCG/2ML IJ SOLN: 25.0000 ug | INTRAMUSCULAR | Status: DC | PRN

## 2018-06-21 MED ORDER — LIDOCAINE HCL 2 % IJ SOLN
20.0000 mL | Freq: Once | INTRAMUSCULAR | Status: AC
  Administered 2018-06-21: 400 mg
  Filled 2018-06-21: qty 40

## 2018-06-21 MED ORDER — MIDAZOLAM HCL 5 MG/5ML IJ SOLN
1.0000 mg | INTRAMUSCULAR | Status: DC | PRN
  Administered 2018-06-21: 2 mg via INTRAVENOUS
  Filled 2018-06-21: qty 5

## 2018-06-21 MED ORDER — ROPIVACAINE HCL 2 MG/ML IJ SOLN
18.0000 mL | Freq: Once | INTRAMUSCULAR | Status: AC
  Administered 2018-06-21: 18 mL via PERINEURAL
  Filled 2018-06-21: qty 20

## 2018-06-21 MED ORDER — FENTANYL CITRATE (PF) 100 MCG/2ML IJ SOLN
25.0000 ug | INTRAMUSCULAR | Status: DC | PRN
  Administered 2018-06-21: 50 ug via INTRAVENOUS
  Filled 2018-06-21: qty 2

## 2018-06-21 MED ORDER — TRIAMCINOLONE ACETONIDE 40 MG/ML IJ SUSP
80.0000 mg | Freq: Once | INTRAMUSCULAR | Status: AC
  Administered 2018-06-21: 80 mg
  Filled 2018-06-21: qty 2

## 2018-06-21 MED ORDER — MIDAZOLAM HCL 5 MG/5ML IJ SOLN: 1.0000 mg | INTRAMUSCULAR | Status: DC | PRN

## 2018-06-21 MED ORDER — LACTATED RINGERS IV SOLN
1000.0000 mL | Freq: Once | INTRAVENOUS | Status: AC
  Administered 2018-06-21: 1000 mL via INTRAVENOUS

## 2018-06-21 MED ORDER — LACTATED RINGERS IV SOLN: 1000.0000 mL | Freq: Once | INTRAVENOUS | Status: DC

## 2018-06-21 NOTE — Progress Notes (Signed)
Safety precautions to be maintained throughout the outpatient stay will include: orient to surroundings, keep bed in low position, maintain call bell within reach at all times, provide assistance with transfer out of bed and ambulation.  

## 2018-06-21 NOTE — Progress Notes (Signed)
Patient's Name: Douglas Andrews  MRN: [id number]  Referring Provider: Katharine Look, MD  DOB: Jan 28, 1951  PCP: Katharine Look, MD  DOS: 06/21/2018  Note by: Oswaldo Done, MD  Service setting: Ambulatory outpatient  Specialty: Interventional Pain Management  Patient type: Established  Location: [facility] (AMB) Pain Management Facility  Visit type: Interventional Procedure   Primary Reason for Visit: Interventional Pain Management Treatment. CC: Back Pain (lower)  Procedure:          Anesthesia, Analgesia, Anxiolysis:  Type: Lumbar Facet, Medial Branch Block(s) #1 (after RF)  Primary Purpose: Diagnostic Region: Posterolateral Lumbosacral Spine Level: L2, L3, L4, L5, & S1 Medial Branch Level(s). Injecting these levels blocks the L3-4, L4-5, and L5-S1 lumbar facet joints. Laterality: Bilateral  Type: Moderate (Conscious) Sedation combined with Local Anesthesia Indication(s): Analgesia and Anxiety Route: Intravenous (IV) IV Access: Secured Sedation: Meaningful verbal contact was maintained at all times during the procedure  Local Anesthetic: Lidocaine 1-2%   Indications: 1. Spondylosis without myelopathy or radiculopathy, lumbosacral region   2. Lumbar facet osteoarthritis (Bilateral)   3. Lumbar facet arthropathy (L4-5 and L5-S1) (Bilateral)   4. Lumbar facet syndrome (Bilateral) (R>L)   5. Chronic low back pain (Primary Source of Pain) (Bilateral) (L>R)   6. Lumbar DDD (degenerative disc disease)    Pain Score: Pre-procedure: 5 /10 Post-procedure: 0-No pain/10  Pre-op Assessment:  Douglas Andrews is a 67 y.o. (year old), male patient, seen today for interventional treatment. He  has a past surgical history that includes Appendectomy; Tonsillectomy; and left rod. Douglas Andrews has a current medication list which includes the following prescription(s): albuterol, aspirin ec, atorvastatin, cetirizine, vitamin d3, gabapentin, glucose blood, insulin glargine, insulin lispro, unifine pentips,  insulin pen needle, isosorbide mononitrate, lisinopril, metformin, oxycodone-acetaminophen, oxycodone-acetaminophen, oxycodone-acetaminophen, victoza, and albuterol, and the following Facility-Administered Medications: fentanyl and midazolam. His primarily concern today is the Back Pain (lower)  The patient had a left lumbar facet radiofrequency ablation on 01/05/2018 (5.5 months ago), and a right sided RFA on 09/24/2017 (9 months ago).  He is here today to determine if the effects of the radiofrequency have worn off.  He has been experiencing some recurrence of his low back pain with a positive hyperextension or rotation test, bilaterally.  Initial Vital Signs:  Pulse/HCG Rate: 68ECG Heart Rate: 65 Temp: 97.7 F (36.5 C) Resp: 18 BP: (!) 159/100 SpO2: 100 %  BMI: Estimated body mass index is 24.33 kg/m as calculated from the following:   Height as of this encounter: 5\' 8"  (1.727 m).   Weight as of this encounter: 160 lb (72.6 kg).  Risk Assessment: Allergies: Reviewed. He is allergic to sertraline.  Allergy Precautions: None required Coagulopathies: Reviewed. None identified.  Blood-thinner therapy: None at this time Active Infection(s): Reviewed. None identified. Douglas Andrews is afebrile  Site Confirmation: Douglas Andrews was asked to confirm the procedure and laterality before marking the site Procedure checklist: Completed Consent: Before the procedure and under the influence of no sedative(s), amnesic(s), or anxiolytics, the patient was informed of the treatment options, risks and possible complications. To fulfill our ethical and legal obligations, as recommended by the American Medical Association's Code of Ethics, I have informed the patient of my clinical impression; the nature and purpose of the treatment or procedure; the risks, benefits, and possible complications of the intervention; the alternatives, including doing nothing; the risk(s) and benefit(s) of the alternative treatment(s)  or procedure(s); and the risk(s) and benefit(s) of doing nothing. The patient was provided information  about the general risks and possible complications associated with the procedure. These may include, but are not limited to: failure to achieve desired goals, infection, bleeding, organ or nerve damage, allergic reactions, paralysis, and death. In addition, the patient was informed of those risks and complications associated to Spine-related procedures, such as failure to decrease pain; infection (i.e.: Meningitis, epidural or intraspinal abscess); bleeding (i.e.: epidural hematoma, subarachnoid hemorrhage, or any other type of intraspinal or peri-dural bleeding); organ or nerve damage (i.e.: Any type of peripheral nerve, nerve root, or spinal cord injury) with subsequent damage to sensory, motor, and/or autonomic systems, resulting in permanent pain, numbness, and/or weakness of one or several areas of the body; allergic reactions; (i.e.: anaphylactic reaction); and/or death. Furthermore, the patient was informed of those risks and complications associated with the medications. These include, but are not limited to: allergic reactions (i.e.: anaphylactic or anaphylactoid reaction(s)); adrenal axis suppression; blood sugar elevation that in diabetics may result in ketoacidosis or comma; water retention that in patients with history of congestive heart failure may result in shortness of breath, pulmonary edema, and decompensation with resultant heart failure; weight gain; swelling or edema; medication-induced neural toxicity; particulate matter embolism and blood vessel occlusion with resultant organ, and/or nervous system infarction; and/or aseptic necrosis of one or more joints. Finally, the patient was informed that Medicine is not an exact science; therefore, there is also the possibility of unforeseen or unpredictable risks and/or possible complications that may result in a catastrophic outcome. The patient  indicated having understood very clearly. We have given the patient no guarantees and we have made no promises. Enough time was given to the patient to ask questions, all of which were answered to the patient's satisfaction. Mr. Thang has indicated that he wanted to continue with the procedure. Attestation: I, the ordering provider, attest that I have discussed with the patient the benefits, risks, side-effects, alternatives, likelihood of achieving goals, and potential problems during recovery for the procedure that I have provided informed consent. Date  Time: 06/21/2018  9:19 AM  Pre-Procedure Preparation:  Monitoring: As per clinic protocol. Respiration, ETCO2, SpO2, BP, heart rate and rhythm monitor placed and checked for adequate function Safety Precautions: Patient was assessed for positional comfort and pressure points before starting the procedure. Time-out: I initiated and conducted the "Time-out" before starting the procedure, as per protocol. The patient was asked to participate by confirming the accuracy of the "Time Out" information. Verification of the correct person, site, and procedure were performed and confirmed by me, the nursing staff, and the patient. "Time-out" conducted as per Joint Commission's Universal Protocol (UP.01.01.01). Time: 1020  Description of Procedure:          Position: Prone Laterality: Bilateral. The procedure was performed in identical fashion on both sides. Levels:  L2, L3, L4, L5, & S1 Medial Branch Level(s) Area Prepped: Posterior Lumbosacral Region Prepping solution: ChloraPrep (2% chlorhexidine gluconate and 70% isopropyl alcohol) Safety Precautions: Aspiration looking for blood return was conducted prior to all injections. At no point did we inject any substances, as a needle was being advanced. Before injecting, the patient was told to immediately notify me if he was experiencing any new onset of "ringing in the ears, or metallic taste in the mouth".  No attempts were made at seeking any paresthesias. Safe injection practices and needle disposal techniques used. Medications properly checked for expiration dates. SDV (single dose vial) medications used. After the completion of the procedure, all disposable equipment used was discarded in  the proper designated medical waste containers. Local Anesthesia: Protocol guidelines were followed. The patient was positioned over the fluoroscopy table. The area was prepped in the usual manner. The time-out was completed. The target area was identified using fluoroscopy. A 12-in long, straight, sterile hemostat was used with fluoroscopic guidance to locate the targets for each level blocked. Once located, the skin was marked with an approved surgical skin marker. Once all sites were marked, the skin (epidermis, dermis, and hypodermis), as well as deeper tissues (fat, connective tissue and muscle) were infiltrated with a small amount of a short-acting local anesthetic, loaded on a 10cc syringe with a 25G, 1.5-in  Needle. An appropriate amount of time was allowed for local anesthetics to take effect before proceeding to the next step. Local Anesthetic: Lidocaine 2.0% The unused portion of the local anesthetic was discarded in the proper designated containers. Technical explanation of process:  L2 Medial Branch Nerve Block (MBB): The target area for the L2 medial branch is at the junction of the postero-lateral aspect of the superior articular process and the superior, posterior, and medial edge of the transverse process of L3. Under fluoroscopic guidance, a Quincke needle was inserted until contact was made with os over the superior postero-lateral aspect of the pedicular shadow (target area). After negative aspiration for blood, 0.5 mL of the nerve block solution was injected without difficulty or complication. The needle was removed intact. L3 Medial Branch Nerve Block (MBB): The target area for the L3 medial branch is at  the junction of the postero-lateral aspect of the superior articular process and the superior, posterior, and medial edge of the transverse process of L4. Under fluoroscopic guidance, a Quincke needle was inserted until contact was made with os over the superior postero-lateral aspect of the pedicular shadow (target area). After negative aspiration for blood, 0.5 mL of the nerve block solution was injected without difficulty or complication. The needle was removed intact. L4 Medial Branch Nerve Block (MBB): The target area for the L4 medial branch is at the junction of the postero-lateral aspect of the superior articular process and the superior, posterior, and medial edge of the transverse process of L5. Under fluoroscopic guidance, a Quincke needle was inserted until contact was made with os over the superior postero-lateral aspect of the pedicular shadow (target area). After negative aspiration for blood, 0.5 mL of the nerve block solution was injected without difficulty or complication. The needle was removed intact. L5 Medial Branch Nerve Block (MBB): The target area for the L5 medial branch is at the junction of the postero-lateral aspect of the superior articular process and the superior, posterior, and medial edge of the sacral ala. Under fluoroscopic guidance, a Quincke needle was inserted until contact was made with os over the superior postero-lateral aspect of the pedicular shadow (target area). After negative aspiration for blood, 0.5 mL of the nerve block solution was injected without difficulty or complication. The needle was removed intact. S1 Medial Branch Nerve Block (MBB): The target area for the S1 medial branch is at the posterior and inferior 6 o'clock position of the L5-S1 facet joint. Under fluoroscopic guidance, the Quincke needle inserted for the L5 MBB was redirected until contact was made with os over the inferior and postero aspect of the sacrum, at the 6 o' clock position under the  L5-S1 facet joint (Target area). After negative aspiration for blood, 0.5 mL of the nerve block solution was injected without difficulty or complication. The needle was removed intact. Procedural  Needles: 22-gauge, 3.5-inch, Quincke needles used for all levels. Nerve block solution: 0.2% PF-Ropivacaine + Triamcinolone (40 mg/mL) diluted to a final concentration of 4 mg of Triamcinolone/mL of Ropivacaine The unused portion of the solution was discarded in the proper designated containers.  Once the entire procedure was completed, the treated area was cleaned, making sure to leave some of the prepping solution back to take advantage of its long term bactericidal properties.   Illustration of the posterior view of the lumbar spine and the posterior neural structures. Laminae of L2 through S1 are labeled. DPRL5, dorsal primary ramus of L5; DPRS1, dorsal primary ramus of S1; DPR3, dorsal primary ramus of L3; FJ, facet (zygapophyseal) joint L3-L4; I, inferior articular process of L4; LB1, lateral branch of dorsal primary ramus of L1; IAB, inferior articular branches from L3 medial branch (supplies L4-L5 facet joint); IBP, intermediate branch plexus; MB3, medial branch of dorsal primary ramus of L3; NR3, third lumbar nerve root; S, superior articular process of L5; SAB, superior articular branches from L4 (supplies L4-5 facet joint also); TP3, transverse process of L3.  Vitals:   06/21/18 1040 06/21/18 1043 06/21/18 1050 06/21/18 1100  BP: (!) 142/91 (!) 160/92 100/64 132/90  Pulse:      Resp: 12 15 15 15   Temp: 98 F (36.7 C)   98.7 F (37.1 C)  TempSrc: Temporal     SpO2: 94% 94% 95% 99%  Weight:      Height:        Start Time: 1020 hrs. End Time: 1030 hrs.  Imaging Guidance (Spinal):          Type of Imaging Technique: Fluoroscopy Guidance (Spinal) Indication(s): Assistance in needle guidance and placement for procedures requiring needle placement in or near specific anatomical locations not  easily accessible without such assistance. Exposure Time: Please see nurses notes. Contrast: None used. Fluoroscopic Guidance: I was personally present during the use of fluoroscopy. "Tunnel Vision Technique" used to obtain the best possible view of the target area. Parallax error corrected before commencing the procedure. "Direction-depth-direction" technique used to introduce the needle under continuous pulsed fluoroscopy. Once target was reached, antero-posterior, oblique, and lateral fluoroscopic projection used confirm needle placement in all planes. Images permanently stored in EMR. Interpretation: No contrast injected. I personally interpreted the imaging intraoperatively. Adequate needle placement confirmed in multiple planes. Permanent images saved into the patient's record.  Antibiotic Prophylaxis:   Anti-infectives (From admission, onward)   None     Indication(s): None identified  Post-operative Assessment:  Post-procedure Vital Signs:  Pulse/HCG Rate: 7067 Temp: 98.7 F (37.1 C) Resp: 15 BP: 132/90 SpO2: 99 %  EBL: None  Complications: No immediate post-treatment complications observed by team, or reported by patient.  Note: The patient tolerated the entire procedure well. A repeat set of vitals were taken after the procedure and the patient was kept under observation following institutional policy, for this type of procedure. Post-procedural neurological assessment was performed, showing return to baseline, prior to discharge. The patient was provided with post-procedure discharge instructions, including a section on how to identify potential problems. Should any problems arise concerning this procedure, the patient was given instructions to immediately contact us, at any time, without hesitation. In any case, we plan to contact the patient by telephone for a follow-up status report regarding this interventional procedure.  Comments:  No additional relevant  information.  Plan of Care    Imaging Orders     DG C-Arm 1-60 Min-No Report  Procedure Orders  LUMBAR FACET(MEDIAL BRANCH NERVE BLOCK) MBNB  Medications ordered for procedure: Meds ordered this encounter  Medications  . lidocaine (XYLOCAINE) 2 % (with pres) injection 400 mg  . DISCONTD: midazolam (VERSED) 5 MG/5ML injection 1-2 mg    Make sure Flumazenil is available in the pyxis when using this medication. If oversedation occurs, administer 0.2 mg IV over 15 sec. If after 45 sec no response, administer 0.2 mg again over 1 min; may repeat at 1 min intervals; not to exceed 4 doses (1 mg)  . DISCONTD: fentaNYL (SUBLIMAZE) injection 25-50 mcg    Make sure Narcan is available in the pyxis when using this medication. In the event of respiratory depression (RR< 8/min): Titrate NARCAN (naloxone) in increments of 0.1 to 0.2 mg IV at 2-3 minute intervals, until desired degree of reversal.  . DISCONTD: lactated ringers infusion 1,000 mL  . ropivacaine (PF) 2 mg/mL (0.2%) (NAROPIN) injection 18 mL  . triamcinolone acetonide (KENALOG-40) injection 80 mg  . midazolam (VERSED) 5 MG/5ML injection 1-2 mg    Make sure Flumazenil is available in the pyxis when using this medication. If oversedation occurs, administer 0.2 mg IV over 15 sec. If after 45 sec no response, administer 0.2 mg again over 1 min; may repeat at 1 min intervals; not to exceed 4 doses (1 mg)  . fentaNYL (SUBLIMAZE) injection 25-50 mcg    Make sure Narcan is available in the pyxis when using this medication. In the event of respiratory depression (RR< 8/min): Titrate NARCAN (naloxone) in increments of 0.1 to 0.2 mg IV at 2-3 minute intervals, until desired degree of reversal.  . lactated ringers infusion 1,000 mL   Medications administered: We administered lidocaine, ropivacaine (PF) 2 mg/mL (0.2%), triamcinolone acetonide, midazolam, fentaNYL, and lactated ringers.  See the medical record for exact dosing, route, and time of  administration.  New Prescriptions   No medications on file   Disposition: Discharge home  Discharge Date & Time: 06/21/2018; 1101 hrs.   Physician-requested Follow-up: Return for post-procedure eval (2 wks), w/ Dr. Laban Emperor.  Future Appointments  Date Time Provider Department Center  07/04/2018 11:15 AM Delano Metz, MD [facility] None  08/16/2018  8:45 AM Barbette Merino, NP [facility] None   Primary Care Physician: Katharine Look, MD Location: [facility] Note by: Oswaldo Done, MD Date: 06/21/2018; Time: 12:04 PM  Disclaimer:  Medicine is not an Visual merchandiser. The only guarantee in medicine is that nothing is guaranteed. It is important to note that the decision to proceed with this intervention was based on the information collected from the patient. The Data and conclusions were drawn from the patient's questionnaire, the interview, and the physical examination. Because the information was provided in large part by the patient, it cannot be guaranteed that it has not been purposely or unconsciously manipulated. Every effort has been made to obtain as much relevant data as possible for this evaluation. It is important to note that the conclusions that lead to this procedure are derived in large part from the available data. Always take into account that the treatment will also be dependent on availability of resources and existing treatment guidelines, considered by other Pain Management Practitioners as being common knowledge and practice, at the time of the intervention. For Medico-Legal purposes, it is also important to point out that variation in procedural techniques and pharmacological choices are the acceptable norm. The indications, contraindications, technique, and results of the above procedure should only be interpreted and  judged by a Board-Certified Interventional Pain Specialist with extensive familiarity and expertise in the same exact  procedure and technique.

## 2018-06-21 NOTE — Patient Instructions (Signed)
____________________________________________________________________________________________  Post-Procedure Discharge Instructions  Instructions:  Apply ice: Fill a plastic sandwich bag with crushed ice. Cover it with a small towel and apply to injection site. Apply for 15 minutes then remove x 15 minutes. Repeat sequence on day of procedure, until you go to bed. The purpose is to minimize swelling and discomfort after procedure.  Apply heat: Apply heat to procedure site starting the day following the procedure. The purpose is to treat any soreness and discomfort from the procedure.  Food intake: Start with clear liquids (like water) and advance to regular food, as tolerated.   Physical activities: Keep activities to a minimum for the first 8 hours after the procedure.   Driving: If you have received any sedation, you are not allowed to drive for 24 hours after your procedure.  Blood thinner: Restart your blood thinner 6 hours after your procedure. (Only for those taking blood thinners)  Insulin: As soon as you can eat, you may resume your normal dosing schedule. (Only for those taking insulin)  Infection prevention: Keep procedure site clean and dry.  Post-procedure Pain Diary: Extremely important that this be done correctly and accurately. Recorded information will be used to determine the next step in treatment.  Pain evaluated is that of treated area only. Do not include pain from an untreated area.  Complete every hour, on the hour, for the initial 8 hours. Set an alarm to help you do this part accurately.  Do not go to sleep and have it completed later. It will not be accurate.  Follow-up appointment: Keep your follow-up appointment after the procedure. Usually 2 weeks for most procedures. (6 weeks in the case of radiofrequency.) Bring you pain diary.   Expect:  From numbing medicine (AKA: Local Anesthetics): Numbness or decrease in pain.  Onset: Full effect within 15  minutes of injected.  Duration: It will depend on the type of local anesthetic used. On the average, 1 to 8 hours.   From steroids: Decrease in swelling or inflammation. Once inflammation is improved, relief of the pain will follow.  Onset of benefits: Depends on the amount of swelling present. The more swelling, the longer it will take for the benefits to be seen. In some cases, up to 10 days.  Duration: Steroids will stay in the system x 2 weeks. Duration of benefits will depend on multiple posibilities including persistent irritating factors.  Occasional side-effects: Facial flushing, cramps (if present, drink Gatorade and take over-the-counter Magnesium 450-500 mg once to twice a day).  From procedure: Some discomfort is to be expected once the numbing medicine wears off. This should be minimal if ice and heat are applied as instructed.  Call if:  You experience numbness and weakness that gets worse with time, as opposed to wearing off.  New onset bowel or bladder incontinence. (This applies to Spinal procedures only)  Emergency Numbers:  Durning business hours (Monday - Thursday, 8:00 AM - 4:00 PM) (Friday, 9:00 AM - 12:00 Noon): [phone]  After hours: [phone] ____________________________________________________________________________________________   ____________________________________________________________________________________________  Pain Scale  Introduction: The pain score used by this practice is the Verbal Numerical Rating Scale (VNRS-11). This is an 11-point scale. It is for adults and children 10 years or older. There are significant differences in how the pain score is reported, used, and applied. Forget everything you learned in the past and learn this scoring system.  General Information: The scale should reflect your current level of pain. Unless you are specifically asked   for the level of your worst pain, or your average pain. If you are asked  for one of these two, then it should be understood that it is over the past 24 hours.  Basic Activities of Daily Living (ADL): Personal hygiene, dressing, eating, transferring, and using restroom.  Instructions: Most patients tend to report their level of pain as a combination of two factors, their physical pain and their psychosocial pain. This last one is also known as "suffering" and it is reflection of how physical pain affects you socially and psychologically. From now on, report them separately. From this point on, when asked to report your pain level, report only your physical pain. Use the following table for reference.  Pain Clinic Pain Levels (0-5/10)  Pain Level Score  Description  No Pain 0   Mild pain 1 Nagging, annoying, but does not interfere with basic activities of daily living (ADL). Patients are able to eat, bathe, get dressed, toileting (being able to get on and off the toilet and perform personal hygiene functions), transfer (move in and out of bed or a chair without assistance), and maintain continence (able to control bladder and bowel functions). Blood pressure and heart rate are unaffected. A normal heart rate for a healthy adult ranges from 60 to 100 bpm (beats per minute).   Mild to moderate pain 2 Noticeable and distracting. Impossible to hide from other people. More frequent flare-ups. Still possible to adapt and function close to normal. It can be very annoying and may have occasional stronger flare-ups. With discipline, patients may get used to it and adapt.   Moderate pain 3 Interferes significantly with activities of daily living (ADL). It becomes difficult to feed, bathe, get dressed, get on and off the toilet or to perform personal hygiene functions. Difficult to get in and out of bed or a chair without assistance. Very distracting. With effort, it can be ignored when deeply involved in activities.   Moderately severe pain 4 Impossible to ignore for more than a few  minutes. With effort, patients may still be able to manage work or participate in some social activities. Very difficult to concentrate. Signs of autonomic nervous system discharge are evident: dilated pupils (mydriasis); mild sweating (diaphoresis); sleep interference. Heart rate becomes elevated (>115 bpm). Diastolic blood pressure (lower number) rises above 100 mmHg. Patients find relief in laying down and not moving.   Severe pain 5 Intense and extremely unpleasant. Associated with frowning face and frequent crying. Pain overwhelms the senses.  Ability to do any activity or maintain social relationships becomes significantly limited. Conversation becomes difficult. Pacing back and forth is common, as getting into a comfortable position is nearly impossible. Pain wakes you up from deep sleep. Physical signs will be obvious: pupillary dilation; increased sweating; goosebumps; brisk reflexes; cold, clammy hands and feet; nausea, vomiting or dry heaves; loss of appetite; significant sleep disturbance with inability to fall asleep or to remain asleep. When persistent, significant weight loss is observed due to the complete loss of appetite and sleep deprivation.  Blood pressure and heart rate becomes significantly elevated. Caution: If elevated blood pressure triggers a pounding headache associated with blurred vision, then the patient should immediately seek attention at an urgent or emergency care unit, as these may be signs of an impending stroke.    Emergency Department Pain Levels (6-10/10)  Emergency Room Pain 6 Severely limiting. Requires emergency care and should not be seen or managed at an outpatient pain management facility. Communication becomes difficult   and requires great effort. Assistance to reach the emergency department may be required. Facial flushing and profuse sweating along with potentially dangerous increases in heart rate and blood pressure will be evident.   Distressing pain 7  Self-care is very difficult. Assistance is required to transport, or use restroom. Assistance to reach the emergency department will be required. Tasks requiring coordination, such as bathing and getting dressed become very difficult.   Disabling pain 8 Self-care is no longer possible. At this level, pain is disabling. The individual is unable to do even the most "basic" activities such as walking, eating, bathing, dressing, transferring to a bed, or toileting. Fine motor skills are lost. It is difficult to think clearly.   Incapacitating pain 9 Pain becomes incapacitating. Thought processing is no longer possible. Difficult to remember your own name. Control of movement and coordination are lost.   The worst pain imaginable 10 At this level, most patients pass out from pain. When this level is reached, collapse of the autonomic nervous system occurs, leading to a sudden drop in blood pressure and heart rate. This in turn results in a temporary and dramatic drop in blood flow to the brain, leading to a loss of consciousness. Fainting is one of the body's self defense mechanisms. Passing out puts the brain in a calmed state and causes it to shut down for a while, in order to begin the healing process.    Summary: 1. Refer to this scale when providing us with your pain level. 2. Be accurate and careful when reporting your pain level. This will help with your care. 3. Over-reporting your pain level will lead to loss of credibility. 4. Even a level of 1/10 means that there is pain and will be treated at our facility. 5. High, inaccurate reporting will be documented as "Symptom Exaggeration", leading to loss of credibility and suspicions of possible secondary gains such as obtaining more narcotics, or wanting to appear disabled, for fraudulent reasons. 6. Only pain levels of 5 or below will be seen at our facility. 7. Pain levels of 6 and above will be sent to the Emergency Department and the appointment  cancelled. ____________________________________________________________________________________________    

## 2018-06-22 ENCOUNTER — Telehealth: Payer: Self-pay

## 2018-06-22 NOTE — Telephone Encounter (Signed)
Patient was called, no problems was reported.

## 2018-07-02 NOTE — Progress Notes (Deleted)
Patient's Name: Douglas Andrews  MRN: [id number]  Referring Provider: Fritzi Mandes, MD  DOB: [birth year]  PCP: Fritzi Mandes, MD  DOS: 07/04/2018  Note by: Gaspar Cola, MD  Service setting: Ambulatory outpatient  Specialty: Interventional Pain Management  Location: [facility] (AMB) Pain Management Facility    Patient type: Established   Primary Reason(s) for Visit: Encounter for post-procedure evaluation of chronic illness with mild to moderate exacerbation CC: No chief complaint on file.  HPI  Douglas Andrews is a 67 y.o. year old, male patient, who comes today for a post-procedure evaluation. He has Chronic low back pain (Primary Source of Pain) (Bilateral) (L>R); Chronic pain syndrome; Lumbar DDD (degenerative disc disease); Lumbar Spondylosis; Lumbar radicular pain (Left) (L5 Dermatome); Lumbar facet syndrome (Bilateral) (R>L); Opiate use (60 MME/Day); Uncomplicated opioid dependence ([place]); Long term prescription opiate use; Chronic knee pain (Tertiary source of pain) (Bilateral) (Right); Vitamin D insufficiency; Overweight; Bell palsy; CN (constipation); Dermatitis, eczematoid; Diabetes mellitus ([place]); Dysmetria; GAD (generalized anxiety disorder); Insomnia due to medical condition; Long term current use of systemic steroids; Poorly controlled type 2 diabetes mellitus ([place]); Pulmonary sarcoidosis ([place]); Arthropathy, traumatic, shoulder; Bursitis, trochanteric; Varices, esophageal ([place]); Leg varices; Hepatic cirrhosis ([place]); Major depressive disorder, single episode; Other long term (current) drug therapy; At risk for falling; Encounter for chronic pain management; Encounter for therapeutic drug level monitoring; Chronic lumbar radicular pain (Left) (L5 Dermatome); Lumbar Levoscoliosis; Neurogenic pain; Osteoarthritis of knee (Right); At high risk for falls; Chest pain; Skin lesion; Pain of left femur (left femoral rod due to sarcoidosis); Chronic hip pain (Right); Osteoarthritis of knee (Bilateral)  (R>L); Essential hypertension; Folliculitis; Chronic sacroiliac joint pain (Left); Chronic hip pain (Left); Chronic thoracic back pain (Left); Diarrhea; Numbness on left side; Gastroenteritis; Other fatigue; Urinary incontinence without sensory awareness; Balanitis; Generalized abdominal pain; Hyperglycemic crisis in diabetes mellitus ([place]); Osteoarthritis of lumbar spine; Chronic lower extremity pain (Secondary Area of Pain) (Bilateral) (L>R); Retrolisthesis (2 mm) of L4 over L5; Anterolisthesis (7 mm) L5 over S1; Lumbar facet arthropathy (L4-5 and L5-S1) (Bilateral); Lumbar facet osteoarthritis (Bilateral); History of lacunar cerebrovascular accident; MCI (mild cognitive impairment); Right-sided lacunar infarction [facility]); Spondylosis without myelopathy or radiculopathy, lumbosacral region; Cellulitis of right upper extremity; On statin therapy; Sleep disturbance; Diabetes mellitus with complication ([place]); Type II diabetes mellitus ([place]); Disorder of skeletal system; Pharmacologic therapy; and Problems influencing health status on their problem list. His primarily concern today is the No chief complaint on file.  Pain Assessment: Location:     Radiating:   Onset:   Duration:   Quality:   Severity:  /10 (subjective, self-reported pain score)  Note: Reported level is compatible with observation.                         When using our objective Pain Scale, levels between 6 and 10/10 are said to belong in an emergency room, as it progressively worsens from a 6/10, described as severely limiting, requiring emergency care not usually available at an outpatient pain management facility. At a 6/10 level, communication becomes difficult and requires great effort. Assistance to reach the emergency department may be required. Facial flushing and profuse sweating along with potentially dangerous increases in heart rate and blood pressure will be evident. Effect on ADL:   Timing:   Modifying factors:   BP:    HR:     Douglas Andrews comes in today for post-procedure evaluation after the treatment done on 06/22/2018.  Further details  on both, my assessment(s), as well as the proposed treatment plan, please see below.  Post-Procedure Assessment  06/21/2018 Procedure: Diagnostic bilateral lumbar facet block #1 (after RFA) under fluoroscopic guidance and IV sedation Pre-procedure pain score:  5/10 Post-procedure pain score: 0/10 (100% relief) Influential Factors: BMI:   Intra-procedural challenges: None observed.         Assessment challenges: None detected.              Reported side-effects: None.        Post-procedural adverse reactions or complications: None reported         Sedation: Sedation provided. When no sedatives are used, the analgesic levels obtained are directly associated to the effectiveness of the local anesthetics. However, when sedation is provided, the level of analgesia obtained during the initial 1 hour following the intervention, is believed to be the result of a combination of factors. These factors may include, but are not limited to: 1. The effectiveness of the local anesthetics used. 2. The effects of the analgesic(s) and/or anxiolytic(s) used. 3. The degree of discomfort experienced by the patient at the time of the procedure. 4. The patients ability and reliability in recalling and recording the events. 5. The presence and influence of possible secondary gains and/or psychosocial factors. Reported result: Relief experienced during the 1st hour after the procedure:   (Ultra-Short Term Relief)            Interpretative annotation: Clinically appropriate result. Analgesia during this period is likely to be Local Anesthetic and/or IV Sedative (Analgesic/Anxiolytic) related.          Effects of local anesthetic: The analgesic effects attained during this period are directly associated to the localized infiltration of local anesthetics and therefore cary significant diagnostic value as to  the etiological location, or anatomical origin, of the pain. Expected duration of relief is directly dependent on the pharmacodynamics of the local anesthetic used. Long-acting (4-6 hours) anesthetics used.  Reported result: Relief during the next 4 to 6 hour after the procedure:   (Short-Term Relief)            Interpretative annotation: Clinically appropriate result. Analgesia during this period is likely to be Local Anesthetic-related.          Long-term benefit: Defined as the period of time past the expected duration of local anesthetics (1 hour for short-acting and 4-6 hours for long-acting). With the possible exception of prolonged sympathetic blockade from the local anesthetics, benefits during this period are typically attributed to, or associated with, other factors such as analgesic sensory neuropraxia, antiinflammatory effects, or beneficial biochemical changes provided by agents other than the local anesthetics.  Reported result: Extended relief following procedure:   (Long-Term Relief)            Interpretative annotation: Clinically possible results. Good relief. No permanent benefit expected. Inflammation plays a part in the etiology to the pain.          Current benefits: Defined as reported results that persistent at this point in time.   Analgesia: *** %            Function: Somewhat improved ROM: Somewhat improved Interpretative annotation: Recurrence of symptoms. No permanent benefit expected. Effective diagnostic intervention.          Interpretation: Results would suggest a successful diagnostic intervention.                  Plan:  Please see "Plan of Care" for details.  Laboratory Chemistry  Inflammation Markers (CRP: Acute Phase) (ESR: Chronic Phase) Lab Results  Component Value Date   CRP <0.5 03/30/2016   ESRSEDRATE 1 03/30/2016                         Renal Markers Lab Results  Component Value Date   BUN 12 03/30/2016   CREATININE 0.85  03/30/2016   GFRAA >60 03/30/2016   GFRNONAA >60 03/30/2016                             Hepatic Markers Lab Results  Component Value Date   AST 24 03/30/2016   ALT 27 03/30/2016   ALBUMIN 4.6 03/30/2016                        Neuropathy Markers Lab Results  Component Value Date   VITAMINB12 539 03/30/2016                        Note: Lab results reviewed.  Recent Imaging Results   Results for orders placed in visit on 06/21/18  DG C-Arm 1-60 Min-No Report   Narrative Fluoroscopy was utilized by the requesting physician.  No radiographic  interpretation.    Interpretation Report: Fluoroscopy was used during the procedure to assist with needle guidance. The images were interpreted intraoperatively by the requesting physician.  Meds   Current Outpatient Medications:  .  albuterol (PROAIR HFA) 108 (90 Base) MCG/ACT inhaler, INHALE 2 PUFFS BY MOUTH EVERY 4 HOURS ASNEEDED FOR WHEEZING, Disp: , Rfl:  .  albuterol (PROVENTIL) (5 MG/ML) 0.5% nebulizer solution, Take 2.5 mg by nebulization every 6 (six) hours as needed for wheezing or shortness of breath. , Disp: , Rfl:  .  aspirin EC 81 MG tablet, Take 81 mg by mouth., Disp: , Rfl:  .  atorvastatin (LIPITOR) 40 MG tablet, Take 40 mg by mouth daily., Disp: , Rfl:  .  cetirizine (ZYRTEC) 10 MG tablet, Take 10 mg by mouth daily. , Disp: , Rfl:  .  Cholecalciferol (VITAMIN D3) 5000 units TABS, Take 1 tablet by mouth daily. , Disp: , Rfl:  .  gabapentin (NEURONTIN) 600 MG tablet, Take 1 tablet (600 mg total) by mouth every 8 (eight) hours., Disp: 270 tablet, Rfl: 0 .  glucose blood (ACCU-CHEK ACTIVE STRIPS) test strip, Checks 3 times daily, Disp: , Rfl:  .  insulin glargine (LANTUS) 100 UNIT/ML injection, Inject 65 Units into the skin at bedtime. , Disp: , Rfl:  .  insulin lispro (HUMALOG) 100 UNIT/ML injection, Inject 18 Units into the skin 3 (three) times daily with meals., Disp: , Rfl:  .  Insulin Pen Needle (UNIFINE PENTIPS) 31G X 6  MM MISC, , Disp: , Rfl:  .  Insulin Pen Needle (UNIFINE PENTIPS) 31G X 6 MM MISC, USE AS DIRECTED WITH LANTUS & NOVOLOG, Disp: , Rfl:  .  isosorbide mononitrate (IMDUR) 30 MG 24 hr tablet, 30 mg daily. , Disp: , Rfl:  .  lisinopril (PRINIVIL,ZESTRIL) 10 MG tablet, Take 10 mg by mouth daily. , Disp: , Rfl:  .  metFORMIN (GLUCOPHAGE) 500 MG tablet, Take 1,000 mg by mouth 2 (two) times daily with a meal., Disp: , Rfl:  .  [START ON 08/07/2018] oxyCODONE-acetaminophen (PERCOCET) 10-325 MG tablet, Take 1 tablet by mouth every 6 (six) hours as needed for pain., Disp: 120 tablet,  Rfl: 0 .  [START ON 07/08/2018] oxyCODONE-acetaminophen (PERCOCET) 10-325 MG tablet, Take 1 tablet by mouth every 6 (six) hours as needed for pain., Disp: 120 tablet, Rfl: 0 .  oxyCODONE-acetaminophen (PERCOCET) 10-325 MG tablet, Take 1 tablet by mouth every 6 (six) hours as needed for pain., Disp: 120 tablet, Rfl: 0 .  VICTOZA 18 MG/3ML SOPN, Inject into the skin 3 (three) times daily. , Disp: , Rfl:   ROS  Constitutional: Denies any fever or chills Gastrointestinal: No reported hemesis, hematochezia, vomiting, or acute GI distress Musculoskeletal: Denies any acute onset joint swelling, redness, loss of ROM, or weakness Neurological: No reported episodes of acute onset apraxia, aphasia, dysarthria, agnosia, amnesia, paralysis, loss of coordination, or loss of consciousness  Allergies  Douglas Andrews is allergic to sertraline.  Onycha  Drug: Douglas Andrews  reports that he does not use drugs. Alcohol:  reports that he does not drink alcohol. Tobacco:  reports that he has never smoked. He has never used smokeless tobacco. Medical:  has a past medical history of Acute postoperative pain (09/14/2017), Chronic back pain, Diabetes mellitus without complication ([place]), Hyperlipidemia, Hypertension, Left leg pain (01/02/2014), Right leg pain (07/15/2015), and Sarcoidosis. Surgical: Douglas Andrews  has a past surgical history that includes  Appendectomy; Tonsillectomy; and left rod. Family: family history includes Diabetes in his father; Heart disease in his father and mother; Stroke in his father.  Constitutional Exam  General appearance: Well nourished, well developed, and well hydrated. In no apparent acute distress There were no vitals filed for this visit. BMI Assessment: Estimated body mass index is 24.33 kg/m as calculated from the following:   Height as of 06/21/18: '5\' 8"'$  (1.727 m).   Weight as of 06/21/18: 160 lb (72.6 kg).  BMI interpretation table: BMI level Category Range association with higher incidence of chronic pain  <18 kg/m2 Underweight   18.5-24.9 kg/m2 Ideal body weight   25-29.9 kg/m2 Overweight Increased incidence by 20%  30-34.9 kg/m2 Obese (Class I) Increased incidence by 68%  35-39.9 kg/m2 Severe obesity (Class II) Increased incidence by 136%  >40 kg/m2 Extreme obesity (Class III) Increased incidence by 254%   Patient's current BMI Ideal Body weight  There is no height or weight on file to calculate BMI. Ideal body weight: 68.4 kg (150 lb 12.7 oz) Adjusted ideal body weight: 70.1 kg (154 lb 7.6 oz)   BMI Readings from Last 4 Encounters:  06/21/18 24.33 kg/m  06/13/18 25.70 kg/m  05/16/18 23.33 kg/m  02/08/18 24.12 kg/m   Wt Readings from Last 4 Encounters:  06/21/18 160 lb (72.6 kg)  06/13/18 169 lb (76.7 kg)  05/16/18 158 lb (71.7 kg)  02/08/18 161 lb (73 kg)  Psych/Mental status: Alert, oriented x 3 (person, place, & time)       Eyes: PERLA Respiratory: No evidence of acute respiratory distress  Cervical Spine Area Exam  Skin & Axial Inspection: No masses, redness, edema, swelling, or associated skin lesions Alignment: Symmetrical Functional ROM: Unrestricted ROM      Stability: No instability detected Muscle Tone/Strength: Functionally intact. No obvious neuro-muscular anomalies detected. Sensory (Neurological): Unimpaired Palpation: No palpable anomalies              Upper  Extremity (UE) Exam    Side: Right upper extremity  Side: Left upper extremity  Skin & Extremity Inspection: Skin color, temperature, and hair growth are WNL. No peripheral edema or cyanosis. No masses, redness, swelling, asymmetry, or associated skin lesions. No contractures.  Skin &  Extremity Inspection: Skin color, temperature, and hair growth are WNL. No peripheral edema or cyanosis. No masses, redness, swelling, asymmetry, or associated skin lesions. No contractures.  Functional ROM: Unrestricted ROM          Functional ROM: Unrestricted ROM          Muscle Tone/Strength: Functionally intact. No obvious neuro-muscular anomalies detected.  Muscle Tone/Strength: Functionally intact. No obvious neuro-muscular anomalies detected.  Sensory (Neurological): Unimpaired          Sensory (Neurological): Unimpaired          Palpation: No palpable anomalies              Palpation: No palpable anomalies              Provocative Test(s):  Phalen's test: deferred Tinel's test: deferred Apley's scratch test (touch opposite shoulder):  Action 1 (Across chest): deferred Action 2 (Overhead): deferred Action 3 (LB reach): deferred   Provocative Test(s):  Phalen's test: deferred Tinel's test: deferred Apley's scratch test (touch opposite shoulder):  Action 1 (Across chest): deferred Action 2 (Overhead): deferred Action 3 (LB reach): deferred    Thoracic Spine Area Exam  Skin & Axial Inspection: No masses, redness, or swelling Alignment: Symmetrical Functional ROM: Unrestricted ROM Stability: No instability detected Muscle Tone/Strength: Functionally intact. No obvious neuro-muscular anomalies detected. Sensory (Neurological): Unimpaired Muscle strength & Tone: No palpable anomalies  Lumbar Spine Area Exam  Skin & Axial Inspection: No masses, redness, or swelling Alignment: Symmetrical Functional ROM: Unrestricted ROM       Stability: No instability detected Muscle Tone/Strength: Functionally  intact. No obvious neuro-muscular anomalies detected. Sensory (Neurological): Unimpaired Palpation: No palpable anomalies       Provocative Tests: Hyperextension/rotation test: deferred today       Lumbar quadrant test ([place]'s test): deferred today       Lateral bending test: deferred today       Patrick's Maneuver: deferred today                   FABER test: deferred today                   S-I anterior distraction/compression test: deferred today         S-I lateral compression test: deferred today         S-I Thigh-thrust test: deferred today         S-I Gaenslen's test: deferred today          Gait & Posture Assessment  Ambulation: Unassisted Gait: Relatively normal for age and body habitus Posture: WNL   Lower Extremity Exam    Side: Right lower extremity  Side: Left lower extremity  Stability: No instability observed          Stability: No instability observed          Skin & Extremity Inspection: Skin color, temperature, and hair growth are WNL. No peripheral edema or cyanosis. No masses, redness, swelling, asymmetry, or associated skin lesions. No contractures.  Skin & Extremity Inspection: Skin color, temperature, and hair growth are WNL. No peripheral edema or cyanosis. No masses, redness, swelling, asymmetry, or associated skin lesions. No contractures.  Functional ROM: Unrestricted ROM                  Functional ROM: Unrestricted ROM                  Muscle Tone/Strength: Functionally intact. No obvious neuro-muscular anomalies detected.  Muscle  Tone/Strength: Functionally intact. No obvious neuro-muscular anomalies detected.  Sensory (Neurological): Unimpaired  Sensory (Neurological): Unimpaired  Palpation: No palpable anomalies  Palpation: No palpable anomalies   Assessment  Primary Diagnosis & Pertinent Problem List: The primary encounter diagnosis was Chronic low back pain (Primary Source of Pain) (Bilateral) (L>R). Diagnoses of Chronic lower extremity pain  (Secondary Area of Pain) (Bilateral) (L>R), Chronic knee pain (Tertiary source of pain) (Bilateral) (Right), Chronic pain syndrome, Pharmacologic therapy, Disorder of skeletal system, Problems influencing health status, Spondylosis without myelopathy or radiculopathy, lumbosacral region, Osteoarthritis of lumbar spine, Lumbar facet osteoarthritis (Bilateral), Lumbar facet arthropathy (L4-5 and L5-S1) (Bilateral), Lumbar facet syndrome (Bilateral) (R>L), and Lumbar DDD (degenerative disc disease) were also pertinent to this visit.  Status Diagnosis  Controlled Controlled Controlled 1. Chronic low back pain (Primary Source of Pain) (Bilateral) (L>R)   2. Chronic lower extremity pain (Secondary Area of Pain) (Bilateral) (L>R)   3. Chronic knee pain [facility] source of pain) (Bilateral) (Right)   4. Chronic pain syndrome   5. Pharmacologic therapy   6. Disorder of skeletal system   7. Problems influencing health status   8. Spondylosis without myelopathy or radiculopathy, lumbosacral region   9. Osteoarthritis of lumbar spine   10. Lumbar facet osteoarthritis (Bilateral)   11. Lumbar facet arthropathy (L4-5 and L5-S1) (Bilateral)   12. Lumbar facet syndrome (Bilateral) (R>L)   13. Lumbar DDD (degenerative disc disease)     Problems updated and reviewed during this visit: Problem  Cellulitis of Right Upper Extremity   Last Assessment & Plan:  Pt was scratched by dog while playing last week.  Left forearm excoriation/laceration with surrounding erythema and warmth.  Begin taking cephalexin 500 mg BID for 7 days.  Last Assessment & Plan:  Abrasion due to fall; worsening surrounding erythema and pain.  Pt is high-risk - will start treatment with augmentin 875-125 mg BID for 7 days.   Disorder of Skeletal System  Pharmacologic Therapy  Problems Influencing Health Status  Sleep Disturbance   Last Assessment & Plan:  If this does not resolve with increased dose of celexa, will consider  trazodone.   Mci (Mild Cognitive Impairment)   Last Assessment & Plan:  CT 11/9 c/f right lacunar infarct of unknown age. MRI w/ and w/o contrast brain 11/10 demonstrated old R thalamic lacunar infarct, no contrast enhancement. MRA w/ and w/o contrast of neck showed bilat. mild atherosclerotic narrowing of prox. Intracranial aa, <25% narrowing.  08/18/17 utox positive for benzodiazepine - pt had moderate conscious sedation day prior for back injection.    Right-sided lacunar infarction [facility])   Last Assessment & Plan:  Pt established with neurology on 1/10.  Mild cognitive impairment - MMSE 25/30 Continue ASA and DM control; follow up in 6 months.  11/10 CT:  - No acute intracranial abnormality. - Old right thalamic lacunar infarct.   History of Lacunar Cerebrovascular Accident   Last Assessment & Plan:  CT 11/9 c/f right lacunar infarct of unknown age. MRI w/ and w/o contrast brain 11/10 demonstrated old R thalamic lacunar infarct, no contrast enhancement.    On Statin Therapy   Last Assessment & Plan:  Formatting of this note might be different from the original. Pt is tolerating current medication(s) well. Medication: continue atorvastatin at same dose. (40 mg nightly) Plan: Current treatment plan is effective, no change in therapy. Lab Results  Component Value Date   LDL 103 04/09/2014   LDL 90 11/06/2012   LDL 87 02/24/2012  HDL 73* 04/09/2014   HDL 57 11/06/2012   TRIG 92 04/09/2014   TRIG 101 02/24/2012   The 10-year ASCVD risk score Mikey Bussing DC Jr., et al., 2013) is: 21%   Values used to calculate the score:     Age: 40 years     Sex: Male     Is an African American: No     Diabetic: Yes     Tobacco smoker: No     Systolic Blood Pressure: 885 mmHg     Prescribed Antihypertensives: Yes     HDL Cholesterol: 73 mg/dL     Total Cholesterol: 194 mg/dL   Diabetes Mellitus With Complication ([facility])   Last Assessment & Plan:  Formatting of this note might be different  from the original. Diabetes isn't at goal of A1C <7.0. Lab Results  Component Value Date   A1C 12.9 (A) 04/13/2017   A1C 11.8 (A) 03/14/2017   A1C 12.8 (A) 02/13/2017   A1C 9.9 (H) 11/08/2016   A1C 8.9 (H) 02/13/2014   A1C 7.9 11/06/2012   A1C 7.5 08/03/2012   A1C 7.8 04/09/2012   MICROALBQTUR <0.6 10/28/2014   Pt is on ASA, statin and ACEi/ARB. Pt is tolerating current medication(s) well. Medication: continue current medicaiton(s) at same dose.  Lantus 60 U nightly  Metformin 1000 mg BID  Victoza 1.8 mg daily Plan: Orders and follow up as documented in patient record. Begin checking blood sugar QID.  Health maintenance: Last foot exam: 07/25/16 Last retinal exam: 03/16/16 Last micro albumin: 02/13/17   Type II Diabetes Mellitus ([facility])   Last Assessment & Plan:  Formatting of this note might be different from the original. Diabetes isn't at goal of A1C <8.0, likely falsely elevated due to recent high sugar load to compensate for hypoglycemia, cellulitis, social/housing instability and subsequent stress, and recent CSI.  Moderate suspicion for diabetic gastroparesis; continue to follow with GI. Lab Results  Component Value Date   A1C 10.4 (A) 04/09/2018   A1C 10.7 (A) 03/08/2018   A1C 10.4 (A) 02/01/2018   A1C 9.9 (H) 11/08/2016   A1C 8.9 (H) 02/13/2014   A1C 7.9 11/06/2012   A1C 7.5 08/03/2012   A1C 7.8 04/09/2012   MICROALBQTUR <0.6 10/28/2014   Pt is on ASA, statin and ACEi/ARB. Pt is tolerating current medication(s) well. Medication: continue current medicaiton(s) at same dose.   Humalog 10 TID AC - recommend using TID vs daily dose as before  Lantus 60u qhs and 30u qAM Metformin 1000 mg BID  Victoza 1.8 mg daily  Plan: Orders and follow up as documented in patient record.  Health maintenance: Last foot exam: 07/17/17 Last retinal exam: 10/30/17 Last micro albumin: 02/13/17 Last Assessment & Plan:  Formatting of this note might be different from the  original. Diabetes isn't at goal of A1C <8.0. Likely worsened due to increased fast food and soda intake. Moderate suspicion for diabetic gastroparesis; continue to follow with GI. Lab Results  Component Value Date   A1C 10.7 (A) 03/08/2018   A1C 10.4 (A) 02/01/2018   A1C 9.6 (A) 01/02/2018   A1C 9.9 (H) 11/08/2016   A1C 8.9 (H) 02/13/2014   A1C 7.9 11/06/2012   A1C 7.5 08/03/2012   A1C 7.8 04/09/2012   MICROALBQTUR <0.6 10/28/2014   Pt is on ASA, statin and ACEi/ARB. Pt is tolerating current medication(s) well. Medication: continue current medicaiton(s) at same dose.   Humalog 12U with breakfast, 14U with lunch and dinner  Lantus 60 U QHS add  30U QAM Metformin 1000 mg BID  Victoza 1.8 mg daily  Plan: Orders and follow up as documented in patient record.  Discussed medication regimen with pt and provided written instructions.  If increasing lantus fails, will consider sulfanuria.  Health maintenance: Last foot exam: 07/17/17 Last retinal exam: 10/30/17 Last micro albumin: 02/13/17  Last Assessment & Plan:  Formatting of this note might be different from the original. Diabetes isn't at goal of A1C <8.0,  Asked to bring BG logs and meter to f/u visit Following with our RD Moderate suspicion for diabetic gastroparesis; continue to follow with GI. Lab Results  Component Value Date   A1C 9.6 (A) 05/21/2018   A1C 10.4 (A) 04/09/2018   A1C 10.7 (A) 03/08/2018   A1C 9.9 (H) 11/08/2016   A1C 8.9 (H) 02/13/2014   A1C 7.9 11/06/2012   A1C 7.5 08/03/2012   A1C 7.8 04/09/2012   MICROALBQTUR <0.6 10/28/2014   Pt is on ASA, statin and ACEi/ARB. Pt is tolerating current medication(s) well. Medication: continue current medicaiton(s) at same dose.   Humalog 10 TID AC - recommend using TID vs daily dose as before  INCREASE Lantus 30u to 35u qAM and 60 to 65u qhs. Discussed notifying us with hypoglycemia episodes  Metformin 1000 mg BID  Victoza 1.8 mg daily  Plan: Orders and  follow up as documented in patient record.  Health maintenance: Last foot exam: 07/17/17 Last retinal exam: 10/30/17 Last micro albumin: 02/13/17   Edema Leg (Resolved)   Plan of Care  Pharmacotherapy (Medications Ordered): No orders of the defined types were placed in this encounter.  Medications administered today: Douglas Andrews had no medications administered during this visit.  Procedure Orders    No procedure(s) ordered today   Lab Orders  No laboratory test(s) ordered today   Imaging Orders  No imaging studies ordered today   Referral Orders  No referral(s) requested today   Interventional management options: Planned, scheduled, and/or pending:   Therapeutic left lumbar facet RFA #2 under fluoroscopic guidance and IV sedation.  We will follow-up with the right side 2 weeks later.   Considering:   Diagnostic bilateral Lumbar facet block Possible bilateral lumbar facet RFA (left side first)  Diagnostic right intra-articular hip injection Possible radiofrequency of the right hipjoint Diagnostic Right L4-5 lumbar epidural steroid injection Diagnostic bilateral intra-articular knee injection Possible series of 5 Hyalgan knee injections.  Diagnostic bilateral Genicular nerve block Possible bilateral Genicular nerve radiofrequencyablation   Palliative PRN treatment(s):   Diagnostic bilateral Lumbar facet block #2 Diagnostic right intra-articular hip injection Diagnostic Right L4-5 lumbar epidural steroid injection Diagnostic bilateral intra-articular knee injection Possible series of 5 Hyalgan knee injections.  Diagnostic bilateral Genicular nerve block    Provider-requested follow-up: No follow-ups on file.  Future Appointments  Date Time Provider [place]  07/04/2018 11:15 AM Milinda Pointer, MD [facility] None  08/16/2018  8:45 AM Vevelyn Francois, NP [facility] None   Primary Care Physician: Fritzi Mandes, MD Location: [facility] Note by: Gaspar Cola, MD Date: 07/04/2018; Time: 8:14 AM

## 2018-07-04 ENCOUNTER — Ambulatory Visit: Payer: Medicare Other | Admitting: Pain Medicine

## 2018-07-04 DIAGNOSIS — Z789 Other specified health status: Secondary | ICD-10-CM | POA: Insufficient documentation

## 2018-07-04 DIAGNOSIS — M899 Disorder of bone, unspecified: Secondary | ICD-10-CM | POA: Insufficient documentation

## 2018-07-04 DIAGNOSIS — Z79899 Other long term (current) drug therapy: Secondary | ICD-10-CM | POA: Insufficient documentation

## 2018-07-25 DIAGNOSIS — I2510 Atherosclerotic heart disease of native coronary artery without angina pectoris: Secondary | ICD-10-CM | POA: Insufficient documentation

## 2018-08-01 ENCOUNTER — Other Ambulatory Visit: Payer: Self-pay | Admitting: Nurse Practitioner

## 2018-08-01 DIAGNOSIS — M792 Neuralgia and neuritis, unspecified: Secondary | ICD-10-CM

## 2018-08-16 ENCOUNTER — Ambulatory Visit: Payer: Medicare Other | Admitting: Nurse Practitioner

## 2018-08-20 ENCOUNTER — Other Ambulatory Visit: Payer: Self-pay

## 2018-08-20 ENCOUNTER — Ambulatory Visit: Payer: Medicare Other | Attending: Nurse Practitioner | Admitting: Nurse Practitioner

## 2018-08-20 ENCOUNTER — Encounter: Payer: Self-pay | Admitting: Nurse Practitioner

## 2018-08-20 VITALS — BP 143/102 | HR 77 | Temp 98.6°F | Resp 18 | Ht 68.0 in | Wt 160.0 lb

## 2018-08-20 DIAGNOSIS — G4701 Insomnia due to medical condition: Secondary | ICD-10-CM | POA: Insufficient documentation

## 2018-08-20 DIAGNOSIS — Z79899 Other long term (current) drug therapy: Secondary | ICD-10-CM | POA: Diagnosis not present

## 2018-08-20 DIAGNOSIS — L309 Dermatitis, unspecified: Secondary | ICD-10-CM | POA: Diagnosis not present

## 2018-08-20 DIAGNOSIS — L03113 Cellulitis of right upper limb: Secondary | ICD-10-CM | POA: Insufficient documentation

## 2018-08-20 DIAGNOSIS — M170 Bilateral primary osteoarthritis of knee: Secondary | ICD-10-CM | POA: Insufficient documentation

## 2018-08-20 DIAGNOSIS — E663 Overweight: Secondary | ICD-10-CM | POA: Diagnosis not present

## 2018-08-20 DIAGNOSIS — M5116 Intervertebral disc disorders with radiculopathy, lumbar region: Secondary | ICD-10-CM | POA: Diagnosis not present

## 2018-08-20 DIAGNOSIS — Z7982 Long term (current) use of aspirin: Secondary | ICD-10-CM | POA: Insufficient documentation

## 2018-08-20 DIAGNOSIS — Z79891 Long term (current) use of opiate analgesic: Secondary | ICD-10-CM | POA: Insufficient documentation

## 2018-08-20 DIAGNOSIS — I10 Essential (primary) hypertension: Secondary | ICD-10-CM | POA: Diagnosis not present

## 2018-08-20 DIAGNOSIS — Z794 Long term (current) use of insulin: Secondary | ICD-10-CM | POA: Diagnosis not present

## 2018-08-20 DIAGNOSIS — N481 Balanitis: Secondary | ICD-10-CM | POA: Insufficient documentation

## 2018-08-20 DIAGNOSIS — M4726 Other spondylosis with radiculopathy, lumbar region: Secondary | ICD-10-CM

## 2018-08-20 DIAGNOSIS — M4316 Spondylolisthesis, lumbar region: Secondary | ICD-10-CM | POA: Diagnosis not present

## 2018-08-20 DIAGNOSIS — E559 Vitamin D deficiency, unspecified: Secondary | ICD-10-CM | POA: Diagnosis not present

## 2018-08-20 DIAGNOSIS — M792 Neuralgia and neuritis, unspecified: Secondary | ICD-10-CM

## 2018-08-20 DIAGNOSIS — Z6824 Body mass index (BMI) 24.0-24.9, adult: Secondary | ICD-10-CM | POA: Insufficient documentation

## 2018-08-20 DIAGNOSIS — Z8249 Family history of ischemic heart disease and other diseases of the circulatory system: Secondary | ICD-10-CM | POA: Insufficient documentation

## 2018-08-20 DIAGNOSIS — E785 Hyperlipidemia, unspecified: Secondary | ICD-10-CM | POA: Insufficient documentation

## 2018-08-20 DIAGNOSIS — I2510 Atherosclerotic heart disease of native coronary artery without angina pectoris: Secondary | ICD-10-CM | POA: Insufficient documentation

## 2018-08-20 DIAGNOSIS — M533 Sacrococcygeal disorders, not elsewhere classified: Secondary | ICD-10-CM | POA: Diagnosis not present

## 2018-08-20 DIAGNOSIS — E119 Type 2 diabetes mellitus without complications: Secondary | ICD-10-CM | POA: Diagnosis not present

## 2018-08-20 DIAGNOSIS — N3942 Incontinence without sensory awareness: Secondary | ICD-10-CM | POA: Diagnosis not present

## 2018-08-20 DIAGNOSIS — G894 Chronic pain syndrome: Secondary | ICD-10-CM | POA: Diagnosis not present

## 2018-08-20 DIAGNOSIS — K529 Noninfective gastroenteritis and colitis, unspecified: Secondary | ICD-10-CM | POA: Diagnosis not present

## 2018-08-20 DIAGNOSIS — D869 Sarcoidosis, unspecified: Secondary | ICD-10-CM | POA: Diagnosis not present

## 2018-08-20 DIAGNOSIS — M5136 Other intervertebral disc degeneration, lumbar region: Secondary | ICD-10-CM

## 2018-08-20 DIAGNOSIS — G8929 Other chronic pain: Secondary | ICD-10-CM

## 2018-08-20 MED ORDER — OXYCODONE-ACETAMINOPHEN 10-325 MG PO TABS: 1.0000 | ORAL_TABLET | Freq: Four times a day (QID) | ORAL | 0 refills | Status: AC | PRN

## 2018-08-20 MED ORDER — GABAPENTIN 600 MG PO TABS: 600.0000 mg | ORAL_TABLET | Freq: Three times a day (TID) | ORAL | 0 refills | Status: DC

## 2018-08-20 NOTE — Progress Notes (Signed)
Nursing Pain Medication Assessment:  Safety precautions to be maintained throughout the outpatient stay will include: orient to surroundings, keep bed in low position, maintain call bell within reach at all times, provide assistance with transfer out of bed and ambulation.  Medication Inspection Compliance: Pill count conducted under aseptic conditions, in front of the patient. Neither the pills nor the bottle was removed from the patient's sight at any time. Once count was completed pills were immediately returned to the patient in their original bottle.  Medication: Oxycodone/APAP Pill/Patch Count: 0 pills remain  Pill/Patch Appearance: Markings consistent with prescribed medication Bottle Appearance: Standard pharmacy container. Clearly labeled. Filled Date: 25 / 29 / 2019 Last Medication intake:  Ran out of medicine more than 48 hours ago

## 2018-08-20 NOTE — Patient Instructions (Addendum)
_gabapentin (NEURONTIN) 600 MG tablet and oxyCODONE-acetaminophen (PERCOCET) 10-325 MG tablet sent to your pharmacy. ___________________________________________________________________________________________  Medication Rules  Applies to: All patients receiving prescriptions (written or electronic).  Pharmacy of record: Pharmacy where electronic prescriptions will be sent. If written prescriptions are taken to a different pharmacy, please inform the nursing staff. The pharmacy listed in the electronic medical record should be the one where you would like electronic prescriptions to be sent.  Prescription refills: Only during scheduled appointments. Applies to both, written and electronic prescriptions.  NOTE: The following applies primarily to controlled substances (Opioid* Pain Medications).   Patient's responsibilities: 1. Pain Pills: Bring all pain pills to every appointment (except for procedure appointments). 2. Pill Bottles: Bring pills in original pharmacy bottle. Always bring newest bottle. Bring bottle, even if empty. 3. Medication refills: You are responsible for knowing and keeping track of what medications you need refilled. The day before your appointment, write a list of all prescriptions that need to be refilled. Bring that list to your appointment and give it to the admitting nurse. Prescriptions will be written only during appointments. If you forget a medication, it will not be "Called in", "Faxed", or "electronically sent". You will need to get another appointment to get these prescribed. 4. Prescription Accuracy: You are responsible for carefully inspecting your prescriptions before leaving our office. Have the discharge nurse carefully go over each prescription with you, before taking them home. Make sure that your name is accurately spelled, that your address is correct. Check the name and dose of your medication to make sure it is accurate. Check the number of pills, and the  written instructions to make sure they are clear and accurate. Make sure that you are given enough medication to last until your next medication refill appointment. 5. Taking Medication: Take medication as prescribed. Never take more pills than instructed. Never take medication more frequently than prescribed. Taking less pills or less frequently is permitted and encouraged, when it comes to controlled substances (written prescriptions).  6. Inform other Doctors: Always inform, all of your healthcare providers, of all the medications you take. 7. Pain Medication from other Providers: You are not allowed to accept any additional pain medication from any other Doctor or Healthcare provider. There are two exceptions to this rule. (see below) In the event that you require additional pain medication, you are responsible for notifying us, as stated below. 8. Medication Agreement: You are responsible for carefully reading and following our Medication Agreement. This must be signed before receiving any prescriptions from our practice. Safely store a copy of your signed Agreement. Violations to the Agreement will result in no further prescriptions. (Additional copies of our Medication Agreement are available upon request.) 9. Laws, Rules, & Regulations: All patients are expected to follow all [address] and [employer], Rules, [place] [employer]. Ignorance of the Laws does not constitute a valid excuse. The use of any illegal substances is prohibited. 10. Adopted CDC guidelines & recommendations: Target dosing levels will be at or below 60 MME/day. Use of benzodiazepines** is not recommended.  Exceptions: There are only two exceptions to the rule of not receiving pain medications from other Healthcare Providers. 1. Exception #1 (Emergencies): In the event of an emergency (i.e.: accident requiring emergency care), you are allowed to receive additional pain medication. However, you are responsible for: As soon as you  are able, call our office [phone], at any time of the day or night, and leave a message stating your name,  the date and nature of the emergency, and the name and dose of the medication prescribed. In the event that your call is answered by a member of our staff, make sure to document and save the date, time, and the name of the person that took your information.  2. Exception #2 (Planned Surgery): In the event that you are scheduled by another doctor or dentist to have any type of surgery or procedure, you are allowed (for a period no longer than 30 days), to receive additional pain medication, for the acute post-op pain. However, in this case, you are responsible for picking up a copy of our "Post-op Pain Management for Surgeons" handout, and giving it to your surgeon or dentist. This document is available at our office, and does not require an appointment to obtain it. Simply go to our office during business hours (Monday-Thursday from 8:00 AM to 4:00 PM) (Friday 8:00 AM to 12:00 Noon) or if you have a scheduled appointment with [place], prior to your surgery, and ask for it by name. In addition, you will need to provide [place] with your name, name of your surgeon, type of surgery, and date of procedure or surgery.  *Opioid medications include: morphine, codeine, oxycodone, oxymorphone, hydrocodone, hydromorphone, meperidine, tramadol, tapentadol, buprenorphine, fentanyl, methadone. **Benzodiazepine medications include: diazepam (Valium), alprazolam (Xanax), clonazepam (Klonopine), lorazepam (Ativan), clorazepate (Tranxene), chlordiazepoxide (Librium), estazolam (Prosom), oxazepam (Serax), temazepam (Restoril), triazolam (Halcion) (Last updated: 12/07/2017) ____________________________________________________________________________________________    BMI Assessment: Estimated body mass index is 24.33 kg/m as calculated from the following:   Height as of this encounter: 5\' 8"  (1.727 m).   Weight as of  this encounter: 160 lb (72.6 kg).  BMI interpretation table: BMI level Category Range association with higher incidence of chronic pain  <18 kg/m2 Underweight   18.5-24.9 kg/m2 Ideal body weight   25-29.9 kg/m2 Overweight Increased incidence by 20%  30-34.9 kg/m2 Obese (Class I) Increased incidence by 68%  35-39.9 kg/m2 Severe obesity (Class II) Increased incidence by 136%  >40 kg/m2 Extreme obesity (Class III) Increased incidence by 254%   Patient's current BMI Ideal Body weight  Body mass index is 24.33 kg/m. Ideal body weight: 68.4 kg (150 lb 12.7 oz) Adjusted ideal body weight: 70.1 kg (154 lb 7.6 oz)   BMI Readings from Last 4 Encounters:  08/20/18 24.33 kg/m  06/21/18 24.33 kg/m  06/13/18 25.70 kg/m  05/16/18 23.33 kg/m   Wt Readings from Last 4 Encounters:  08/20/18 160 lb (72.6 kg)  06/21/18 160 lb (72.6 kg)  06/13/18 169 lb (76.7 kg)  05/16/18 158 lb (71.7 kg)

## 2018-08-20 NOTE — Progress Notes (Signed)
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 Medication: continue current medicaiton(s) at same dose.  Lisinopril 10 mg daily Plan: Current treatment plan is effective, no change in therapy. Lab Results  Component Value Date   NA 133 (L) 08/20/2017   NA 134 (L) 01/20/2015   K 4.5 08/20/2017   K 4.3 01/20/2015   CREATININE 0.71 08/20/2017   CREATININE 0.84 01/20/2015   BP Readings from Last 4 Encounters:  02/01/18 118/79  01/02/18 114/80  11/07/17 142/86  10/27/17 133/84  Last Assessment & Plan:  Formatting of this note might be different from the original. Today's BP: 122/80 is at goal below 130/80. Hypertension is reasonably well controlled.  Pt is tolerating current medication(s) well. Medication: continue current medicaiton(s) at same dose.  Lisinopril 10 mg daily Plan: Current treatment plan is effective, no change in therapy. Lab Results  Component Value Date   NA 133 (L) 02/11/2018   NA 134 (L) 01/20/2015   K 4.4 02/11/2018   K 4.3 01/20/2015    CREATININE 0.84 02/11/2018   CREATININE 0.84 01/20/2015   BP Readings from Last 4 Encounters:  04/09/18 122/80  03/27/18 117/83  03/08/18 129/85  02/11/18 149/82  Last Assessment & Plan:  Formatting of this note might be different from the original. Borderline controlled Currently taking lisinopril '10mg'$  daily and imdur '30mg'$  daily  BP Readings from Last 3 Encounters:  05/21/18 139/89  04/09/18 122/80  03/27/18 117/83  Last Assessment & Plan:  Formatting of this note might be different from the original. Well controlled Currently taking lisinopril '10mg'$  daily and imdur '30mg'$  daily. Continue current management.  BP Readings from Last 3 Encounters:  06/25/18 118/75  05/21/18 139/89  04/09/18 122/80   Coronary Artery Disease Involving Native Coronary Artery of Native Heart Without Angina Pectoris  Type II Diabetes Mellitus (Hcc)   Last Assessment & Plan:  Formatting of this note might be different from the original. Diabetes isn't at goal of A1C <8.0, likely falsely elevated due to recent high sugar load to compensate for hypoglycemia, cellulitis, social/housing instability and subsequent stress, and recent CSI.  Moderate suspicion for diabetic gastroparesis; continue to follow with GI. Lab Results  Component Value Date   A1C 10.4 (A) 04/09/2018   A1C 10.7 (A) 03/08/2018   A1C 10.4 (A) 02/01/2018   A1C 9.9 (H) 11/08/2016   A1C 8.9 (H) 02/13/2014   A1C 7.9 11/06/2012   A1C 7.5 08/03/2012   A1C 7.8 04/09/2012   MICROALBQTUR <0.6 10/28/2014   Pt is on ASA, statin and ACEi/ARB. Pt is tolerating current medication(s) well. Medication: continue current medicaiton(s) at same dose.   Humalog 10 TID AC - recommend using TID vs daily dose as before  Lantus 60u qhs and 30u qAM Metformin 1000 mg BID  Victoza 1.8 mg daily  Plan: Orders and follow up as documented in patient record.  Health maintenance: Last foot exam: 07/17/17 Last retinal exam: 10/30/17 Last  micro albumin:  02/13/17 Last Assessment & Plan:  Formatting of this note might be different from the original. Diabetes isn't at goal of A1C <8.0. Likely worsened due to increased fast food and soda intake. Moderate suspicion for diabetic gastroparesis; continue to follow with GI. Lab Results  Component Value Date   A1C 10.7 (A) 03/08/2018   A1C 10.4 (A) 02/01/2018   A1C 9.6 (A) 01/02/2018   A1C 9.9 (H) 11/08/2016   A1C 8.9 (H) 02/13/2014   A1C 7.9 11/06/2012   A1C 7.5 08/03/2012   A1C 7.8 04/09/2012   MICROALBQTUR <0.6 10/28/2014   Pt is on ASA, statin and ACEi/ARB. Pt is tolerating current medication(s) well. Medication: continue current medicaiton(s) at same dose.   Humalog 12U with breakfast, 14U with lunch and dinner  Lantus 60 U QHS add 30U QAM Metformin 1000 mg BID  Victoza 1.8 mg daily  Plan: Orders and follow up as documented in patient record.  Discussed medication regimen with pt and provided written instructions.  If increasing lantus fails, will consider sulfanuria.  Health maintenance: Last foot exam: 07/17/17 Last retinal exam: 10/30/17 Last micro albumin: 02/13/17  Last Assessment & Plan:  Formatting of this note might be different from the original. Diabetes isn't at goal of A1C <8.0,  Asked to bring BG logs and meter to f/u visit Following with our RD Moderate suspicion for diabetic gastroparesis; continue to follow with GI. Lab Results  Component Value Date   A1C 9.6 (A) 05/21/2018   A1C 10.4 (A) 04/09/2018   A1C 10.7 (A) 03/08/2018   A1C 9.9 (H) 11/08/2016   A1C 8.9 (H) 02/13/2014   A1C 7.9 11/06/2012   A1C 7.5 08/03/2012   A1C 7.8 04/09/2012   MICROALBQTUR <0.6 10/28/2014   Pt is on ASA, statin and ACEi/ARB. Pt is tolerating current medication(s) well. Medication: continue current medicaiton(s) at same dose.   Humalog 10 TID AC - recommend using TID vs daily dose as before  INCREASE Lantus 30u to 35u qAM and 60 to 65u qhs. Discussed notifying us with  hypoglycemia episodes  Metformin 1000 mg BID  Victoza 1.8 mg daily  Plan: Orders and follow up as documented in patient record.  Health maintenance: Last foot exam: 07/17/17 Last retinal exam: 10/30/17 Last micro albumin: 02/13/17  Last Assessment & Plan:  Formatting of this note might be different from the original. Diabetes isn't at goal of A1C <8.0, but improving after increasing insulin. Reviewed glucometer but not enough readings over the past month. Wide range. Following with our RD Moderate suspicion for diabetic gastroparesis; continue to follow with GI. Lab Results  Component Value Date   HGB A1C/POC 7.9 11/06/2012   HGB A1C/POC 7.5 08/03/2012   HGB A1C/POC 7.8 04/09/2012   HGB A1C, POC 9.9 (H) 11/08/2016   Hemoglobin A1C 8.0 (A) 07/25/2018   Hemoglobin A1C 8.3 (A) 06/25/2018   Hemoglobin A1C 9.6 (A) 05/21/2018   Hemoglobin A1C 8.9 (H) 02/13/2014   Microalbumin Quantitative, Urine <0.6 10/28/2014   Pt is on ASA, statin and ACEi/ARB. Pt is tolerating current medication(s) well. Medication: continue current medicaiton(s) at same dose.   Humalog 10 TID AC - Continue TID dosing. Will consider increasing at f/u visit once I review glucometer  Continue Lantus 35u qAM and 65u qhs. Discussed notifying us with hypoglycemia episodes  Metformin 1000 mg BID  Victoza 1.8 mg daily  Plan: Orders and follow up as documented in patient record.  Health maintenance: Last foot exam: 06/25/18 Last retinal exam:  10/30/17 Last micro albumin: 02/01/18    Plan of Care  Pharmacotherapy (Medications Ordered): Meds ordered this encounter  Medications  . oxyCODONE-acetaminophen (PERCOCET) 10-325 MG tablet    Sig: Take 1 tablet by mouth every 6 (six) hours as needed for pain.    Dispense:  120 tablet    Refill:  0    Do not place this medication, or any other prescription from our practice, on "Automatic Refill". Patient may have prescription filled one day early if pharmacy is closed on  scheduled refill date.    Order Specific Question:   Supervising Provider    Answer:   Milinda Pointer [id number]  . gabapentin (NEURONTIN) 600 MG tablet    Sig: Take 1 tablet (600 mg total) by mouth every 8 (eight) hours.    Dispense:  90 tablet    Refill:  0    Do not place this medication, or any other prescription from our practice, on "Automatic Refill". Patient may have prescription filled one day early if pharmacy is closed on scheduled refill date.    Order Specific Question:   Supervising Provider    Answer:   Milinda Pointer [[id number]]   New Prescriptions   No medications on file   Medications administered today: Olon Russ had no medications administered during this visit. Lab-work, procedure(s), and/or referral(s): No orders of the defined types were placed in this encounter.  Imaging and/or referral(s): None  Interventional management options: Planned, scheduled, and/or pending:   Not at this time.    Considering:   Diagnostic bilateral Lumbar facet block Possible bilateral lumbar facet RFA (left side first)  Diagnostic right intra-articular hip injection Possible radiofrequency of the right hipjoint Diagnostic Right L4-5 lumbar epidural steroid injection Diagnostic bilateral intra-articular knee injection Possible series of 5 Hyalgan knee injections.  Diagnostic bilateral Genicular nerve block Possible bilateral Genicular nerve radiofrequencyablation   Palliative PRN treatment(s):   Diagnostic bilateral Lumbar facet block #2 Diagnostic right intra-articular hip injection Diagnostic Right L4-5 lumbar epidural steroid injection Diagnostic bilateral intra-articular knee injection Possible series of 5 Hyalgan knee injections.  Diagnostic bilateral Genicular nerve block    Provider-requested follow-up: Return in about 4 weeks (around 09/17/2018) for MedMgmt.  No future appointments. Primary Care Physician: Fritzi Mandes, MD Location: [facility]  Outpatient Pain Management Facility Note by: Vevelyn Francois NP Date: 08/20/2018; Time: 11:03 AM  Pain Score Disclaimer: We use the NRS-11 scale. This is a self-reported, subjective measurement of pain severity with only modest accuracy. It is used primarily to identify changes within a particular patient. It must be understood that outpatient pain scales are significantly less accurate that those used for research, where they can be applied under ideal controlled circumstances with minimal exposure to variables. In reality, the score is likely to be a combination of pain intensity and pain affect, where pain affect describes the degree of emotional arousal or changes in action readiness caused by the sensory experience of pain. Factors such as social and work situation, setting, emotional state, anxiety levels, expectation, and prior pain experience may influence pain perception and show large inter-individual differences that may also be affected by time variables.  Patient instructions provided during this appointment: Patient Instructions   _gabapentin (NEURONTIN) 600 MG tablet and oxyCODONE-acetaminophen (PERCOCET) 10-325 MG tablet sent to your pharmacy. ___________________________________________________________________________________________  Medication Rules  Applies to: All patients receiving prescriptions (written or electronic).  Pharmacy of record: Pharmacy where electronic prescriptions will be sent. If written prescriptions are taken to a different  pharmacy, please inform the nursing staff. The pharmacy listed in the electronic medical record should be the one where you would like electronic prescriptions to be sent.  Prescription refills: Only during scheduled appointments. Applies to both, written and electronic prescriptions.  NOTE: The following applies primarily to controlled substances (Opioid* Pain Medications).   Patient's responsibilities: 1. Pain Pills: Bring all pain  pills to every appointment (except for procedure appointments). 2. Pill Bottles: Bring pills in original pharmacy bottle. Always bring newest bottle. Bring bottle, even if empty. 3. Medication refills: You are responsible for knowing and keeping track of what medications you need refilled. The day before your appointment, write a list of all prescriptions that need to be refilled. Bring that list to your appointment and give it to the admitting nurse. Prescriptions will be written only during appointments. If you forget a medication, it will not be "Called in", "Faxed", or "electronically sent". You will need to get another appointment to get these prescribed. 4. Prescription Accuracy: You are responsible for carefully inspecting your prescriptions before leaving our office. Have the discharge nurse carefully go over each prescription with you, before taking them home. Make sure that your name is accurately spelled, that your address is correct. Check the name and dose of your medication to make sure it is accurate. Check the number of pills, and the written instructions to make sure they are clear and accurate. Make sure that you are given enough medication to last until your next medication refill appointment. 5. Taking Medication: Take medication as prescribed. Never take more pills than instructed. Never take medication more frequently than prescribed. Taking less pills or less frequently is permitted and encouraged, when it comes to controlled substances (written prescriptions).  6. Inform other Doctors: Always inform, all of your healthcare providers, of all the medications you take. 7. Pain Medication from other Providers: You are not allowed to accept any additional pain medication from any other Doctor or Healthcare provider. There are two exceptions to this rule. (see below) In the event that you require additional pain medication, you are responsible for notifying us, as stated below. 8. Medication  Agreement: You are responsible for carefully reading and following our Medication Agreement. This must be signed before receiving any prescriptions from our practice. Safely store a copy of your signed Agreement. Violations to the Agreement will result in no further prescriptions. (Additional copies of our Medication Agreement are available upon request.) 9. Laws, Rules, & Regulations: All patients are expected to follow all Federal and [employer], [employer], Rules, [employer]. Ignorance of the Laws does not constitute a valid excuse. The use of any illegal substances is prohibited. 10. Adopted CDC guidelines & recommendations: Target dosing levels will be at or below 60 MME/day. Use of benzodiazepines** is not recommended.  Exceptions: There are only two exceptions to the rule of not receiving pain medications from other Healthcare Providers. 1. Exception #1 (Emergencies): In the event of an emergency (i.e.: accident requiring emergency care), you are allowed to receive additional pain medication. However, you are responsible for: As soon as you are able, call our office [phone], at any time of the day or night, and leave a message stating your name, the date and nature of the emergency, and the name and dose of the medication prescribed. In the event that your call is answered by a member of our staff, make sure to document and save the date, time, and the name of the person that took your  information.  2. Exception #2 (Planned Surgery): In the event that you are scheduled by another doctor or dentist to have any type of surgery or procedure, you are allowed (for a period no longer than 30 days), to receive additional pain medication, for the acute post-op pain. However, in this case, you are responsible for picking up a copy of our "Post-op Pain Management for Surgeons" handout, and giving it to your surgeon or dentist. This document is available at our office, and does not require an appointment  to obtain it. Simply go to our office during business hours (Monday-Thursday from 8:00 AM to 4:00 PM) (Friday 8:00 AM to 12:00 Noon) or if you have a scheduled appointment with [place], prior to your surgery, and ask for it by name. In addition, you will need to provide [place] with your name, name of your surgeon, type of surgery, and date of procedure or surgery.  *Opioid medications include: morphine, codeine, oxycodone, oxymorphone, hydrocodone, hydromorphone, meperidine, tramadol, tapentadol, buprenorphine, fentanyl, methadone. **Benzodiazepine medications include: diazepam (Valium), alprazolam (Xanax), clonazepam (Klonopine), lorazepam (Ativan), clorazepate (Tranxene), chlordiazepoxide (Librium), estazolam (Prosom), oxazepam (Serax), temazepam (Restoril), triazolam (Halcion) (Last updated: 12/07/2017) ____________________________________________________________________________________________    BMI Assessment: Estimated body mass index is 24.33 kg/m as calculated from the following:   Height as of this encounter: '5\' 8"'$  (1.727 m).   Weight as of this encounter: 160 lb (72.6 kg).  BMI interpretation table: BMI level Category Range association with higher incidence of chronic pain  <18 kg/m2 Underweight   18.5-24.9 kg/m2 Ideal body weight   25-29.9 kg/m2 Overweight Increased incidence by 20%  30-34.9 kg/m2 Obese (Class I) Increased incidence by 68%  35-39.9 kg/m2 Severe obesity (Class II) Increased incidence by 136%  >40 kg/m2 Extreme obesity (Class III) Increased incidence by 254%   Patient's current BMI Ideal Body weight  Body mass index is 24.33 kg/m. Ideal body weight: 68.4 kg (150 lb 12.7 oz) Adjusted ideal body weight: 70.1 kg (154 lb 7.6 oz)   BMI Readings from Last 4 Encounters:  08/20/18 24.33 kg/m  06/21/18 24.33 kg/m  06/13/18 25.70 kg/m  05/16/18 23.33 kg/m   Wt Readings from Last 4 Encounters:  08/20/18 160 lb (72.6 kg)  06/21/18 160 lb (72.6 kg)  06/13/18 169 lb (76.7  kg)  05/16/18 158 lb (71.7 kg)

## 2018-09-17 ENCOUNTER — Ambulatory Visit: Payer: Medicare Other | Attending: Nurse Practitioner | Admitting: Nurse Practitioner

## 2018-09-17 ENCOUNTER — Other Ambulatory Visit: Payer: Self-pay

## 2018-09-17 ENCOUNTER — Encounter: Payer: Self-pay | Admitting: Nurse Practitioner

## 2018-09-17 VITALS — BP 156/89 | HR 79 | Temp 97.5°F | Resp 16 | Ht 68.0 in | Wt 160.3 lb

## 2018-09-17 DIAGNOSIS — Z79891 Long term (current) use of opiate analgesic: Secondary | ICD-10-CM | POA: Diagnosis not present

## 2018-09-17 DIAGNOSIS — F329 Major depressive disorder, single episode, unspecified: Secondary | ICD-10-CM | POA: Diagnosis not present

## 2018-09-17 DIAGNOSIS — F411 Generalized anxiety disorder: Secondary | ICD-10-CM | POA: Insufficient documentation

## 2018-09-17 DIAGNOSIS — G894 Chronic pain syndrome: Secondary | ICD-10-CM | POA: Diagnosis not present

## 2018-09-17 DIAGNOSIS — I8500 Esophageal varices without bleeding: Secondary | ICD-10-CM | POA: Diagnosis not present

## 2018-09-17 DIAGNOSIS — M1711 Unilateral primary osteoarthritis, right knee: Secondary | ICD-10-CM | POA: Diagnosis not present

## 2018-09-17 DIAGNOSIS — Z8249 Family history of ischemic heart disease and other diseases of the circulatory system: Secondary | ICD-10-CM | POA: Insufficient documentation

## 2018-09-17 DIAGNOSIS — E559 Vitamin D deficiency, unspecified: Secondary | ICD-10-CM | POA: Diagnosis not present

## 2018-09-17 DIAGNOSIS — I10 Essential (primary) hypertension: Secondary | ICD-10-CM | POA: Insufficient documentation

## 2018-09-17 DIAGNOSIS — M533 Sacrococcygeal disorders, not elsewhere classified: Secondary | ICD-10-CM | POA: Insufficient documentation

## 2018-09-17 DIAGNOSIS — E663 Overweight: Secondary | ICD-10-CM | POA: Insufficient documentation

## 2018-09-17 DIAGNOSIS — M7060 Trochanteric bursitis, unspecified hip: Secondary | ICD-10-CM | POA: Insufficient documentation

## 2018-09-17 DIAGNOSIS — G4700 Insomnia, unspecified: Secondary | ICD-10-CM | POA: Diagnosis not present

## 2018-09-17 DIAGNOSIS — K7460 Unspecified cirrhosis of liver: Secondary | ICD-10-CM | POA: Diagnosis not present

## 2018-09-17 DIAGNOSIS — M4726 Other spondylosis with radiculopathy, lumbar region: Secondary | ICD-10-CM | POA: Diagnosis not present

## 2018-09-17 DIAGNOSIS — G8929 Other chronic pain: Secondary | ICD-10-CM

## 2018-09-17 DIAGNOSIS — M792 Neuralgia and neuritis, unspecified: Secondary | ICD-10-CM | POA: Diagnosis not present

## 2018-09-17 DIAGNOSIS — M4316 Spondylolisthesis, lumbar region: Secondary | ICD-10-CM | POA: Insufficient documentation

## 2018-09-17 DIAGNOSIS — E119 Type 2 diabetes mellitus without complications: Secondary | ICD-10-CM | POA: Insufficient documentation

## 2018-09-17 DIAGNOSIS — Z6824 Body mass index (BMI) 24.0-24.9, adult: Secondary | ICD-10-CM | POA: Insufficient documentation

## 2018-09-17 DIAGNOSIS — M25551 Pain in right hip: Secondary | ICD-10-CM | POA: Diagnosis not present

## 2018-09-17 DIAGNOSIS — Z7952 Long term (current) use of systemic steroids: Secondary | ICD-10-CM | POA: Insufficient documentation

## 2018-09-17 DIAGNOSIS — Z7982 Long term (current) use of aspirin: Secondary | ICD-10-CM | POA: Insufficient documentation

## 2018-09-17 DIAGNOSIS — I2510 Atherosclerotic heart disease of native coronary artery without angina pectoris: Secondary | ICD-10-CM | POA: Diagnosis not present

## 2018-09-17 DIAGNOSIS — M4186 Other forms of scoliosis, lumbar region: Secondary | ICD-10-CM | POA: Diagnosis not present

## 2018-09-17 DIAGNOSIS — Z79899 Other long term (current) drug therapy: Secondary | ICD-10-CM | POA: Diagnosis not present

## 2018-09-17 DIAGNOSIS — R5383 Other fatigue: Secondary | ICD-10-CM | POA: Diagnosis not present

## 2018-09-17 DIAGNOSIS — M5116 Intervertebral disc disorders with radiculopathy, lumbar region: Secondary | ICD-10-CM | POA: Diagnosis not present

## 2018-09-17 DIAGNOSIS — D860 Sarcoidosis of lung: Secondary | ICD-10-CM | POA: Insufficient documentation

## 2018-09-17 DIAGNOSIS — Z794 Long term (current) use of insulin: Secondary | ICD-10-CM | POA: Insufficient documentation

## 2018-09-17 NOTE — Progress Notes (Signed)
Nursing Pain Medication Assessment:  Safety precautions to be maintained throughout the outpatient stay will include: orient to surroundings, keep bed in low position, maintain call bell within reach at all times, provide assistance with transfer out of bed and ambulation.  Medication Inspection Compliance: [name] did not comply with our request to bring his pills to be counted. He was reminded that bringing the medication bottles, even when empty, is a requirement.  Medication: None brought in. Pill/Patch Count: None available to be counted. Bottle Appearance: No container available. Did not bring bottle(s) to appointment. Filled Date: N/A Last Medication intake:  Today   Pt brought 3 of 4 weekly pill packs from home. 2 are empty and 1 is full. Pt states he mistakenly left the pill pack that is currently in use at home. 28 oxycodone pills are presented today in blister pack.  Filled 09/07/2018

## 2018-09-17 NOTE — Progress Notes (Signed)
Patient's Name: Douglas Andrews  MRN: [id number]  Referring Provider: Fritzi Mandes, MD  DOB: 11-Sep-1951  PCP: Fritzi Mandes, MD  DOS: 09/17/2018  Note by: Vevelyn Francois NP  Service setting: Ambulatory outpatient  Specialty: Interventional Pain Management  Location: [facility] (AMB) Pain Management Facility    Patient type: Established    Primary Reason(s) for Visit: Encounter for prescription drug management. (Level of risk: moderate)  CC: Back Pain (lower)  HPI  Douglas Andrews is a 67 y.o. year old, male patient, who comes today for a medication management evaluation. He has Chronic low back pain (Primary Source of Pain) (Bilateral) (L>R); Chronic pain syndrome; Lumbar DDD (degenerative disc disease); Lumbar Spondylosis; Lumbar radicular pain (Left) (L5 Dermatome); Lumbar facet syndrome (Bilateral) (R>L); Opiate use (60 MME/Day); Uncomplicated opioid dependence ([place]); Long term prescription opiate use; Chronic knee pain (Tertiary source of pain) (Bilateral) (Right); Vitamin D insufficiency; Overweight; Bell palsy; CN (constipation); Dermatitis, eczematoid; Diabetes mellitus ([place]); Dysmetria; GAD (generalized anxiety disorder); Insomnia due to medical condition; Long term current use of systemic steroids; Poorly controlled type 2 diabetes mellitus ([place]); Pulmonary sarcoidosis ([place]); Arthropathy, traumatic, shoulder; Bursitis, trochanteric; Varices, esophageal ([place]); Leg varices; Hepatic cirrhosis ([place]); Major depressive disorder, single episode; Other long term (current) drug therapy; At risk for falling; Encounter for chronic pain management; Encounter for therapeutic drug level monitoring; Chronic lumbar radicular pain (Left) (L5 Dermatome); Lumbar Levoscoliosis; Neurogenic pain; Osteoarthritis of knee (Right); At high risk for falls; Chest pain; Skin lesion; Pain of left femur (left femoral rod due to sarcoidosis); Chronic hip pain (Right); Osteoarthritis of knee (Bilateral) (R>L); Essential  hypertension; Folliculitis; Chronic sacroiliac joint pain (Left); Chronic hip pain (Left); Chronic thoracic back pain (Left); Diarrhea; Numbness on left side; Gastroenteritis; Other fatigue; Urinary incontinence without sensory awareness; Balanitis; Generalized abdominal pain; Hyperglycemic crisis in diabetes mellitus ([place]); Osteoarthritis of lumbar spine; Chronic lower extremity pain (Secondary Area of Pain) (Bilateral) (L>R); Retrolisthesis (2 mm) of L4 over L5; Anterolisthesis (7 mm) L5 over S1; Lumbar facet arthropathy (L4-5 and L5-S1) (Bilateral); Lumbar facet osteoarthritis (Bilateral); History of lacunar cerebrovascular accident; MCI (mild cognitive impairment); Right-sided lacunar infarction [facility]); Spondylosis without myelopathy or radiculopathy, lumbosacral region; Cellulitis of right upper extremity; On statin therapy; Sleep disturbance; Diabetes mellitus with complication ([place]); Type II diabetes mellitus ([place]); Disorder of skeletal system; Pharmacologic therapy; Problems influencing health status; and Coronary artery disease involving native coronary artery of native heart without angina pectoris on their problem list. His primarily concern today is the Back Pain (lower)  Pain Assessment: Location: Lower, Right, Left Back Radiating: to upper right thigh Onset: More than a month ago Duration: Chronic pain Quality: Aching, Constant, Throbbing Severity: 7 /10 (subjective, self-reported pain score)  Note: Reported level is compatible with observation. Clinically the patient looks like a 2/10 A 2/10 is viewed as "Mild to Moderate" and described as noticeable and distracting. Impossible to hide from other people. More frequent flare-ups. Still possible to adapt and function close to normal. It can be very annoying and may have occasional stronger flare-ups. With discipline, patients may get used to it and adapt.       When using our objective Pain Scale, levels between 6 and 10/10 are said to belong in  an emergency room, as it progressively worsens from a 6/10, described as severely limiting, requiring emergency care not usually available at an outpatient pain management facility. At a 6/10 level, communication becomes difficult and requires great effort. Assistance to reach the emergency department may be  required. Facial flushing and profuse sweating along with potentially dangerous increases in heart rate and blood pressure will be evident. Effect on ADL: "I must lay in bed a lot because of pain" Timing:   Modifying factors: meds BP: (!) 156/89  HR: 79  Mr. Tippin was last scheduled for an appointment on 08/20/2018 for medication management. During today's appointment we reviewed Mr. Cappiello chronic pain status, as well as his outpatient medication regimen.  The patient  reports that he does not use drugs. His body mass index is 24.37 kg/m.  Further details on both, my assessment(s), as well as the proposed treatment plan, please see below.  Controlled Substance Pharmacotherapy Assessment REMS (Risk Evaluation and Mitigation Strategy)  Analgesic:Oxycodone/APAP 10/325 one every 6 hours (40 mg/day) MME/day:60 mg/day  Rise Patience, RN  09/17/2018 11:02 AM  Signed Nursing Pain Medication Assessment:  Safety precautions to be maintained throughout the outpatient stay will include: orient to surroundings, keep bed in low position, maintain call bell within reach at all times, provide assistance with transfer out of bed and ambulation.  Medication Inspection Compliance: Mr. Jayson did not comply with our request to bring his pills to be counted. He was reminded that bringing the medication bottles, even when empty, is a requirement.  Medication: None brought in. Pill/Patch Count: None available to be counted. Bottle Appearance: No container available. Did not bring bottle(s) to appointment. Filled Date: N/A Last Medication intake:  Today   Pt brought 3 of 4 weekly pill packs from home.  2 are empty and 1 is full. Pt states he mistakenly left the pill pack that is currently in use at home. 28 oxycodone pills are presented today in blister pack.  Filled 09/07/2018   Pharmacokinetics: Liberation and absorption (onset of action): WNL Distribution (time to peak effect): WNL Metabolism and excretion (duration of action): WNL         Pharmacodynamics: Desired effects: Analgesia: Mr. Lapaglia reports >50% benefit. Functional ability: Patient reports that medication allows him to accomplish basic ADLs Clinically meaningful improvement in function (CMIF): Sustained CMIF goals met Perceived effectiveness: Described as relatively effective, allowing for increase in activities of daily living (ADL) Undesirable effects: Side-effects or Adverse reactions: None reported Monitoring: [place] PMP: Online review of the past 95-monthperiod conducted. Compliant with practice rules and regulations Last UDS on record: Summary  Date Value Ref Range Status  12/06/2017 FINAL  Final    Comment:    ==================================================================== TOXASSURE SELECT 13 (MW) ==================================================================== Test                             Result       Flag       Units Drug Present and Declared for Prescription Verification   Noroxycodone                   773          EXPECTED   ng/mg creat    Noroxycodone is an expected metabolite of oxycodone. Sources of    oxycodone include scheduled prescription medications. Drug Present not Declared for Prescription Verification   7-aminoclonazepam              63           UNEXPECTED ng/mg creat    7-aminoclonazepam is an expected metabolite of clonazepam. Source    of clonazepam is a scheduled prescription medication. Drug Absent but Declared for Prescription Verification  Oxycodone                      Not Detected UNEXPECTED ng/mg creat    Oxycodone is almost always present in patients taking this  drug    consistently.  Absence of oxycodone could be due to lapse of time    since the last dose or unusual pharmacokinetics (rapid    metabolism). ==================================================================== Test                      Result    Flag   Units      Ref Range   Creatinine              41               mg/dL      >=20 ==================================================================== Declared Medications:  The flagging and interpretation on this report are based on the  following declared medications.  Unexpected results may arise from  inaccuracies in the declared medications.  **Note: The testing scope of this panel includes these medications:  Oxycodone (Percocet)  **Note: The testing scope of this panel does not include following  reported medications:  Acetaminophen (Percocet)  Albuterol  Aspirin (Aspirin 81)  Atorvastatin (Lipitor)  Cetirizine (Zyrtec)  Clotrimazole (Lotrimin)  Gabapentin (Neurontin)  Insulin (Humalog)  Insulin (Lantus)  Isosorbide (Imdur)  Liraglutide (Victoza)  Lisinopril  Metformin  Nystatin  Vitamin D3 ==================================================================== For clinical consultation, please call [phone]. ====================================================================    UDS interpretation: Compliant          Medication Assessment Form: Reviewed. Patient indicates being compliant with therapy Treatment compliance: Compliant Risk Assessment Profile: Aberrant behavior: failure to bring medications for pill counts Comorbid factors increasing risk of overdose: caucasian and male gender Opioid risk tool (ORT) (Total Score): 0 Personal History of Substance Abuse (SUD-Substance use disorder):  Alcohol: Negative  Illegal Drugs: Negative  Rx Drugs: Negative  ORT Risk Level calculation: Low Risk Risk of substance use disorder (SUD): Moderate-to-High Opioid Risk Tool - 09/17/18 1001      Family History of  Substance Abuse   Alcohol  Negative    Illegal Drugs  Negative    Rx Drugs  Negative      Personal History of Substance Abuse   Alcohol  Negative    Illegal Drugs  Negative    Rx Drugs  Negative      Age   Age between 24-45 years   No      History of Preadolescent Sexual Abuse   History of Preadolescent Sexual Abuse  Negative or Male      Psychological Disease   Psychological Disease  Negative    Depression  Negative      Total Score   Opioid Risk Tool Scoring  0    Opioid Risk Interpretation  Low Risk      ORT Scoring interpretation table:  Score <3 = Low Risk for SUD  Score between 4-7 = Moderate Risk for SUD  Score >8 = High Risk for Opioid Abuse   Risk Mitigation Strategies:  Patient Counseling: Covered Patient-Prescriber Agreement (PPA): Present and active  Notification to other healthcare providers: Done  Pharmacologic Plan: No change in therapy, at this time.             Laboratory Chemistry  Inflammation Markers (CRP: Acute Phase) (ESR: Chronic Phase) Lab Results  Component Value Date   CRP <0.5 03/30/2016   ESRSEDRATE 1 03/30/2016  Rheumatology Markers No results found for: RF, ANA, LABURIC, URICUR, LYMEIGGIGMAB, LYMEABIGMQN, HLAB27                      Renal Function Markers Lab Results  Component Value Date   BUN 12 03/30/2016   CREATININE 0.85 03/30/2016   GFRAA >60 03/30/2016   GFRNONAA >60 03/30/2016                             Hepatic Function Markers Lab Results  Component Value Date   AST 24 03/30/2016   ALT 27 03/30/2016   ALBUMIN 4.6 03/30/2016   ALKPHOS 63 03/30/2016                        Electrolytes Lab Results  Component Value Date   NA 136 03/30/2016   K 3.8 03/30/2016   CL 100 (L) 03/30/2016   CALCIUM 9.2 03/30/2016   MG 2.2 03/30/2016                        Neuropathy Markers Lab Results  Component Value Date   JSEGBTDV76 160 03/30/2016                        CNS Tests No results  found for: COLORCSF, APPEARCSF, RBCCOUNTCSF, WBCCSF, POLYSCSF, LYMPHSCSF, EOSCSF, PROTEINCSF, GLUCCSF, JCVIRUS, CSFOLI, IGGCSF                      Bone Pathology Markers Lab Results  Component Value Date   25OHVITD1 38 03/30/2016   25OHVITD2 3.4 03/30/2016   25OHVITD3 35 03/30/2016                         Coagulation Parameters No results found for: INR, LABPROT, APTT, PLT, DDIMER, LABHEMA, VITAMINK1                      Cardiovascular Markers No results found for: BNP, CKTOTAL, CKMB, TROPONINI, HGB, HCT                       CA Markers No results found for: CEA, CA125, LABCA2                      Note: Lab results reviewed.  Recent Diagnostic Imaging Results  DG C-Arm 1-60 Min-No Report Fluoroscopy was utilized by the requesting physician.  No radiographic  interpretation.   Complexity Note: Imaging results reviewed. Results shared with Mr. Eber, using Layman's terms.                         Meds   Current Outpatient Medications:  .  albuterol (PROAIR HFA) 108 (90 Base) MCG/ACT inhaler, INHALE 2 PUFFS BY MOUTH EVERY 4 HOURS ASNEEDED FOR WHEEZING, Disp: , Rfl:  .  albuterol (PROVENTIL) (5 MG/ML) 0.5% nebulizer solution, Take 2.5 mg by nebulization every 6 (six) hours as needed for wheezing or shortness of breath. , Disp: , Rfl:  .  aspirin EC 81 MG tablet, Take 81 mg by mouth., Disp: , Rfl:  .  atorvastatin (LIPITOR) 40 MG tablet, Take 40 mg by mouth daily., Disp: , Rfl:  .  cetirizine (ZYRTEC) 10 MG tablet, Take 10 mg by mouth daily. , Disp: , Rfl:  .  Cholecalciferol (VITAMIN D3) 5000 units TABS, Take 1 tablet by mouth daily. , Disp: , Rfl:  .  gabapentin (NEURONTIN) 600 MG tablet, Take 1 tablet (600 mg total) by mouth every 8 (eight) hours., Disp: 90 tablet, Rfl: 0 .  glucose blood (ACCU-CHEK ACTIVE STRIPS) test strip, Checks 3 times daily, Disp: , Rfl:  .  insulin glargine (LANTUS) 100 UNIT/ML injection, Inject 65 Units into the skin at bedtime. , Disp: , Rfl:  .   insulin lispro (HUMALOG) 100 UNIT/ML injection, Inject 18 Units into the skin 3 (three) times daily with meals., Disp: , Rfl:  .  Insulin Pen Needle (UNIFINE PENTIPS) 31G X 6 MM MISC, , Disp: , Rfl:  .  Insulin Pen Needle (UNIFINE PENTIPS) 31G X 6 MM MISC, USE AS DIRECTED WITH LANTUS & NOVOLOG, Disp: , Rfl:  .  isosorbide mononitrate (IMDUR) 30 MG 24 hr tablet, 30 mg daily. , Disp: , Rfl:  .  lisinopril (PRINIVIL,ZESTRIL) 10 MG tablet, Take 10 mg by mouth daily. , Disp: , Rfl:  .  metFORMIN (GLUCOPHAGE) 500 MG tablet, Take 1,000 mg by mouth 2 (two) times daily with a meal., Disp: , Rfl:  .  oxyCODONE-acetaminophen (PERCOCET) 10-325 MG tablet, Take 1 tablet by mouth every 6 (six) hours as needed for pain., Disp: 120 tablet, Rfl: 0 .  oxyCODONE-acetaminophen (PERCOCET) 10-325 MG tablet, Take 1 tablet by mouth every 6 (six) hours as needed for pain., Disp: 120 tablet, Rfl: 0 .  oxyCODONE-acetaminophen (PERCOCET) 10-325 MG tablet, Take 1 tablet by mouth every 6 (six) hours as needed for pain., Disp: 120 tablet, Rfl: 0 .  VICTOZA 18 MG/3ML SOPN, Inject into the skin 3 (three) times daily. , Disp: , Rfl:   ROS  Constitutional: Denies any fever or chills Gastrointestinal: No reported hemesis, hematochezia, vomiting, or acute GI distress Musculoskeletal: Denies any acute onset joint swelling, redness, loss of ROM, or weakness Neurological: No reported episodes of acute onset apraxia, aphasia, dysarthria, agnosia, amnesia, paralysis, loss of coordination, or loss of consciousness  Allergies  Mr. Samad is allergic to sertraline.  [place]  Drug: Mr. Slatter  reports that he does not use drugs. Alcohol:  reports that he does not drink alcohol. Tobacco:  reports that he has never smoked. He has never used smokeless tobacco. Medical:  has a past medical history of Acute postoperative pain (09/14/2017), Chronic back pain, Diabetes mellitus without complication ([place]), Hyperlipidemia, Hypertension, Left leg pain  (01/02/2014), Right leg pain (07/15/2015), and Sarcoidosis. Surgical: Mr. Ingwersen  has a past surgical history that includes Appendectomy; Tonsillectomy; and left rod. Family: family history includes Diabetes in his father; Heart disease in his father and mother; Stroke in his father.  Constitutional Exam  General appearance: Well nourished, well developed, and well hydrated. In no apparent acute distress Vitals:   09/17/18 0954  BP: (!) 156/89  Pulse: 79  Resp: 16  Temp: (!) 97.5 F (36.4 C)  TempSrc: Oral  SpO2: 100%  Weight: 160 lb 4.8 oz (72.7 kg)  Height: '5\' 8"'$  (1.727 m)  Psych/Mental status: Alert, oriented x 3 (person, place, & time)       Eyes: PERLA Respiratory: No evidence of acute respiratory distress  Cervical Spine Area Exam  Skin & Axial Inspection: No masses, redness, edema, swelling, or associated skin lesions Alignment: Symmetrical Functional ROM: Unrestricted ROM      Stability: No instability detected Muscle Tone/Strength: Functionally intact. No obvious neuro-muscular anomalies detected. Sensory (Neurological): Unimpaired Palpation: No palpable anomalies  Upper Extremity (UE) Exam    Side: Right upper extremity  Side: Left upper extremity  Skin & Extremity Inspection: Skin color, temperature, and hair growth are WNL. No peripheral edema or cyanosis. No masses, redness, swelling, asymmetry, or associated skin lesions. No contractures.  Skin & Extremity Inspection: Skin color, temperature, and hair growth are WNL. No peripheral edema or cyanosis. No masses, redness, swelling, asymmetry, or associated skin lesions. No contractures.  Functional ROM: Unrestricted ROM          Functional ROM: Unrestricted ROM          Muscle Tone/Strength: Functionally intact. No obvious neuro-muscular anomalies detected.  Muscle Tone/Strength: Functionally intact. No obvious neuro-muscular anomalies detected.  Sensory (Neurological): Unimpaired          Sensory  (Neurological): Unimpaired          Palpation: No palpable anomalies              Palpation: No palpable anomalies                   Thoracic Spine Area Exam  Skin & Axial Inspection: No masses, redness, or swelling Alignment: Symmetrical Functional ROM: Unrestricted ROM Stability: No instability detected Muscle Tone/Strength: Functionally intact. No obvious neuro-muscular anomalies detected. Sensory (Neurological): Unimpaired Muscle strength & Tone: No palpable anomalies  Lumbar Spine Area Exam  Skin & Axial Inspection: No masses, redness, or swelling Alignment: Symmetrical Functional ROM: Unrestricted ROM       Stability: No instability detected Muscle Tone/Strength: Functionally intact. No obvious neuro-muscular anomalies detected. Sensory (Neurological): Unimpaired Palpation: No palpable anomalies        Gait & Posture Assessment  Ambulation: Unassisted Gait: Relatively normal for age and body habitus Posture: WNL   Lower Extremity Exam    Side: Right lower extremity  Side: Left lower extremity  Stability: No instability observed          Stability: No instability observed          Skin & Extremity Inspection: Skin color, temperature, and hair growth are WNL. No peripheral edema or cyanosis. No masses, redness, swelling, asymmetry, or associated skin lesions. No contractures.  Skin & Extremity Inspection: Skin color, temperature, and hair growth are WNL. No peripheral edema or cyanosis. No masses, redness, swelling, asymmetry, or associated skin lesions. No contractures.  Functional ROM: Unrestricted ROM                  Functional ROM: Unrestricted ROM                  Muscle Tone/Strength: Functionally intact. No obvious neuro-muscular anomalies detected.  Muscle Tone/Strength: Functionally intact. No obvious neuro-muscular anomalies detected.  Sensory (Neurological): Unimpaired        Sensory (Neurological): Unimpaired            Palpation: No palpable anomalies   Palpation: No palpable anomalies   Assessment  Primary Diagnosis & Pertinent Problem List: The primary encounter diagnosis was Lumbar Spondylosis. Diagnoses of Neurogenic pain, Chronic hip pain (Right), Chronic pain syndrome, and Long term prescription opiate use were also pertinent to this visit.  Status Diagnosis  Controlled Controlled Controlled 1. Lumbar Spondylosis   2. Neurogenic pain   3. Chronic hip pain (Right)   4. Chronic pain syndrome   5. Long term prescription opiate use     Problems updated and reviewed during this visit: Problem  Essential Hypertension   Last Assessment & Plan:  Formatting of this note may  be different from the original. Today's BP: 131/78 mmHg is at goal below 140/90. Hypertension is asymptomatic and reasonably well controlled. Pt is tolerating current medication(s) well. Medication: continue current medication at same dose.  Lisinopril 10 mg QD Plan: Orders and follow up as documented in patient record.  Lab Results  Component Value Date   NA 136 02/17/2016   NA 134* 01/20/2015   K 4.5 02/17/2016   K 4.3 01/20/2015   CREATININE 1.0 03/23/2016   CREATININE 0.87 02/17/2016   CREATININE 0.84 01/20/2015   BP Readings from Last 4 Encounters:  04/21/16 131/78  04/20/16 131/79  04/07/16 148/91  03/21/16 125/75   Last Assessment & Plan:  Formatting of this note may be different from the original. Today's BP: 142/86 is not at goal below 130/80 - has been taking NyQuil. [place] Hypertension is borderline controlled.  Pt is tolerating current medication(s) well. Medication: continue current medicaiton(s) at same dose.  Lisinopril 10 mg daily Plan: Current treatment plan is effective, no change in therapy. Lab Results  Component Value Date   NA 133 (L) 08/20/2017   NA 134 (L) 01/20/2015   K 4.5 08/20/2017   K 4.3 01/20/2015   CREATININE 0.71 08/20/2017   CREATININE 0.84 01/20/2015   BP Readings from Last 4 Encounters:  11/07/17 142/86   10/27/17 133/84  10/25/17 132/90  09/13/17 136/88   Last Assessment & Plan:  Formatting of this note might be different from the original. Today's BP: 118/79 is at goal below 130/80. Hypertension is reasonably well controlled.  Pt is tolerating current medication(s) well. Medication: continue current medicaiton(s) at same dose.  Lisinopril 10 mg daily Plan: Current treatment plan is effective, no change in therapy. Lab Results  Component Value Date   NA 133 (L) 08/20/2017   NA 134 (L) 01/20/2015   K 4.5 08/20/2017   K 4.3 01/20/2015   CREATININE 0.71 08/20/2017   CREATININE 0.84 01/20/2015   BP Readings from Last 4 Encounters:  02/01/18 118/79  01/02/18 114/80  11/07/17 142/86  10/27/17 133/84  Last Assessment & Plan:  Formatting of this note might be different from the original. Today's BP: 122/80 is at goal below 130/80. Hypertension is reasonably well controlled.  Pt is tolerating current medication(s) well. Medication: continue current medicaiton(s) at same dose.  Lisinopril 10 mg daily Plan: Current treatment plan is effective, no change in therapy. Lab Results  Component Value Date   NA 133 (L) 02/11/2018   NA 134 (L) 01/20/2015   K 4.4 02/11/2018   K 4.3 01/20/2015   CREATININE 0.84 02/11/2018   CREATININE 0.84 01/20/2015   BP Readings from Last 4 Encounters:  04/09/18 122/80  03/27/18 117/83  03/08/18 129/85  02/11/18 149/82  Last Assessment & Plan:  Formatting of this note might be different from the original. Borderline controlled Currently taking lisinopril '10mg'$  daily and imdur '30mg'$  daily  BP Readings from Last 3 Encounters:  05/21/18 139/89  04/09/18 122/80  03/27/18 117/83  Last Assessment & Plan:  Formatting of this note might be different from the original. Well controlled Currently taking lisinopril '10mg'$  daily and imdur '30mg'$  daily. Continue current management.  BP Readings from Last 3 Encounters:  06/25/18 118/75  05/21/18 139/89   04/09/18 122/80  Last Assessment & Plan:  Formatting of this note might be different from the original. Well controlled Currently taking lisinopril '10mg'$  daily and imdur '30mg'$  daily. Continue current management.  BP Readings from Last 3 Encounters:  07/25/18 135/89  06/25/18  118/75  05/21/18 139/89   Coronary Artery Disease Involving Native Coronary Artery of Native Heart Without Angina Pectoris   Last Assessment & Plan:  Formatting of this note might be different from the original. No cardiopulm compaints. LHC 02/2016 Continue atorvastatin '40mg'$  daily  Lab Results  Component Value Date   LDL 48 (L) 08/19/2017   LHC 02/2016 1. Mid LAD myocardial bridge  2. Non flow limiting coronary artery disease including a 40-50% mid LAD stenosis   2. Normal left ventricular filling pressures (LVEPD = 12 mm Hg). 3. Hyperdynamic left ventricular contractile function (estimated LVEF = >75 %).    Plan of Care  Pharmacotherapy (Medications Ordered): No orders of the defined types were placed in this encounter.  New Prescriptions   No medications on file   Medications administered today: Carrington Olazabal had no medications administered during this visit. Lab-work, procedure(s), and/or referral(s): Orders Placed This Encounter  Procedures  . ToxASSURE Select 13 (MW), Urine   Imaging and/or referral(s): None  Interventional management options: Planned, scheduled, and/or pending: Not at this time.    Considering: Diagnostic bilateral Lumbar facet block Possible bilateral lumbar facet RFA (left side first)  Diagnostic right intra-articular hip injection Possible radiofrequency of the right hipjoint Diagnostic Right L4-5 lumbar epidural steroid injection Diagnostic bilateral intra-articular knee injection Possible series of 5 Hyalgan knee injections.  Diagnostic bilateral Genicular nerve block Possible bilateral Genicular nerve radiofrequencyablation   Palliative PRN  treatment(s): Diagnostic bilateral Lumbar facet block #2 Diagnostic right intra-articular hip injection Diagnostic Right L4-5 lumbar epidural steroid injection Diagnostic bilateral intra-articular knee injection Possible series of 5 Hyalgan knee injections.  Diagnostic bilateral Genicular nerve block    Provider-requested follow-up: Return in about 4 weeks (around 10/15/2018) for MedMgmt.  Future Appointments  Date Time Provider [place]  10/18/2018  9:45 AM Vevelyn Francois, NP [facility] None   Primary Care Physician: Fritzi Mandes, MD Location: [facility] Note by: Vevelyn Francois NP Date: 09/17/2018; Time: 4:17 PM  Pain Score Disclaimer: We use the NRS-11 scale. This is a self-reported, subjective measurement of pain severity with only modest accuracy. It is used primarily to identify changes within a particular patient. It must be understood that outpatient pain scales are significantly less accurate that those used for research, where they can be applied under ideal controlled circumstances with minimal exposure to variables. In reality, the score is likely to be a combination of pain intensity and pain affect, where pain affect describes the degree of emotional arousal or changes in action readiness caused by the sensory experience of pain. Factors such as social and work situation, setting, emotional state, anxiety levels, expectation, and prior pain experience may influence pain perception and show large inter-individual differences that may also be affected by time variables.  Patient instructions provided during this appointment: Patient Instructions   ____________________________________________________________________________________________  Medication Rules  Purpose: To inform patients, and their family members, of our rules and regulations.  Applies to: All patients receiving prescriptions (written or electronic).  Pharmacy of  record: Pharmacy where electronic prescriptions will be sent. If written prescriptions are taken to a different pharmacy, please inform the nursing staff. The pharmacy listed in the electronic medical record should be the one where you would like electronic prescriptions to be sent.  Electronic prescriptions: In compliance with the [place] (STOP) Act of 2017 (Session Lanny Cramp [phone]), effective October 10, 2018, all controlled substances must be electronically prescribed. Calling prescriptions to  the pharmacy will cease to exist.  Prescription refills: Only during scheduled appointments. Applies to all prescriptions.  NOTE: The following applies primarily to controlled substances (Opioid* Pain Medications).   Patient's responsibilities: 1. Pain Pills: Bring all pain pills to every appointment (except for procedure appointments). 2. Pill Bottles: Bring pills in original pharmacy bottle. Always bring the newest bottle. Bring bottle, even if empty. 3. Medication refills: You are responsible for knowing and keeping track of what medications you take and those you need refilled. The day before your appointment: write a list of all prescriptions that need to be refilled. The day of the appointment: give the list to the admitting nurse. Prescriptions will be written only during appointments. If you forget a medication: it will not be "Called in", "Faxed", or "electronically sent". You will need to get another appointment to get these prescribed. No early refills. Do not call asking to have your prescription filled early. 4. Prescription Accuracy: You are responsible for carefully inspecting your prescriptions before leaving our office. Have the discharge nurse carefully go over each prescription with you, before taking them home. Make sure that your name is accurately spelled, that your address is correct. Check the name and dose of your medication to make sure it  is accurate. Check the number of pills, and the written instructions to make sure they are clear and accurate. Make sure that you are given enough medication to last until your next medication refill appointment. 5. Taking Medication: Take medication as prescribed. When it comes to controlled substances, taking less pills or less frequently than prescribed is permitted and encouraged. Never take more pills than instructed. Never take medication more frequently than prescribed.  6. Inform other Doctors: Always inform, all of your healthcare providers, of all the medications you take. 7. Pain Medication from other Providers: You are not allowed to accept any additional pain medication from any other Doctor or Healthcare provider. There are two exceptions to this rule. (see below) In the event that you require additional pain medication, you are responsible for notifying us, as stated below. 8. Medication Agreement: You are responsible for carefully reading and following our Medication Agreement. This must be signed before receiving any prescriptions from our practice. Safely store a copy of your signed Agreement. Violations to the Agreement will result in no further prescriptions. (Additional copies of our Medication Agreement are available upon request.) 9. Laws, Rules, & Regulations: All patients are expected to follow all Federal and [employer], Rules, [employer]. Ignorance of the Laws does not constitute a valid excuse. The use of any illegal substances is prohibited. 10. Adopted CDC guidelines & recommendations: Target dosing levels will be at or below 60 MME/day. Use of benzodiazepines** is not recommended.  Exceptions: There are only two exceptions to the rule of not receiving pain medications from other Healthcare Providers. 1. Exception #1 (Emergencies): In the event of an emergency (i.e.: accident requiring emergency care), you are allowed to receive additional pain medication.  However, you are responsible for: As soon as you are able, call our office [phone], at any time of the day or night, and leave a message stating your name, the date and nature of the emergency, and the name and dose of the medication prescribed. In the event that your call is answered by a member of our staff, make sure to document and save the date, time, and the name of the person that took your information.  2. Exception #2 (Planned Surgery):  In the event that you are scheduled by another doctor or dentist to have any type of surgery or procedure, you are allowed (for a period no longer than 30 days), to receive additional pain medication, for the acute post-op pain. However, in this case, you are responsible for picking up a copy of our "Post-op Pain Management for Surgeons" handout, and giving it to your surgeon or dentist. This document is available at our office, and does not require an appointment to obtain it. Simply go to our office during business hours (Monday-Thursday from 8:00 AM to 4:00 PM) (Friday 8:00 AM to 12:00 Noon) or if you have a scheduled appointment with [place], prior to your surgery, and ask for it by name. In addition, you will need to provide [place] with your name, name of your surgeon, type of surgery, and date of procedure or surgery.  *Opioid medications include: morphine, codeine, oxycodone, oxymorphone, hydrocodone, hydromorphone, meperidine, tramadol, tapentadol, buprenorphine, fentanyl, methadone. **Benzodiazepine medications include: diazepam (Valium), alprazolam (Xanax), clonazepam (Klonopine), lorazepam (Ativan), clorazepate (Tranxene), chlordiazepoxide (Librium), estazolam (Prosom), oxazepam (Serax), temazepam (Restoril), triazolam (Halcion) (Last updated: 12/07/2017) ____________________________________________________________________________________________   BMI Assessment: Estimated body mass index is 24.37 kg/m as calculated from the following:   Height as of  this encounter: '5\' 8"'$  (1.727 m).   Weight as of this encounter: 160 lb 4.8 oz (72.7 kg).  BMI interpretation table: BMI level Category Range association with higher incidence of chronic pain  <18 kg/m2 Underweight   18.5-24.9 kg/m2 Ideal body weight   25-29.9 kg/m2 Overweight Increased incidence by 20%  30-34.9 kg/m2 Obese (Class I) Increased incidence by 68%  35-39.9 kg/m2 Severe obesity (Class II) Increased incidence by 136%  >40 kg/m2 Extreme obesity (Class III) Increased incidence by 254%   Patient's current BMI Ideal Body weight  Body mass index is 24.37 kg/m. Ideal body weight: 68.4 kg (150 lb 12.7 oz) Adjusted ideal body weight: 70.1 kg (154 lb 9.6 oz)   BMI Readings from Last 4 Encounters:  09/17/18 24.37 kg/m  08/20/18 24.33 kg/m  06/21/18 24.33 kg/m  06/13/18 25.70 kg/m   Wt Readings from Last 4 Encounters:  09/17/18 160 lb 4.8 oz (72.7 kg)  08/20/18 160 lb (72.6 kg)  06/21/18 160 lb (72.6 kg)  06/13/18 169 lb (76.7 kg)

## 2018-09-17 NOTE — Patient Instructions (Addendum)
____________________________________________________________________________________________  Medication Rules  Purpose: To inform patients, and their family members, of our rules and regulations.  Applies to: All patients receiving prescriptions (written or electronic).  Pharmacy of record: Pharmacy where electronic prescriptions will be sent. If written prescriptions are taken to a different pharmacy, please inform the nursing staff. The pharmacy listed in the electronic medical record should be the one where you would like electronic prescriptions to be sent.  Electronic prescriptions: In compliance with the [place] Strengthen Opioid Misuse Prevention (STOP) Act of 2017 (Session Law 2017-74/H243), effective October 10, 2018, all controlled substances must be electronically prescribed. Calling prescriptions to the pharmacy will cease to exist.  Prescription refills: Only during scheduled appointments. Applies to all prescriptions.  NOTE: The following applies primarily to controlled substances (Opioid* Pain Medications).   Patient's responsibilities: 1. Pain Pills: Bring all pain pills to every appointment (except for procedure appointments). 2. Pill Bottles: Bring pills in original pharmacy bottle. Always bring the newest bottle. Bring bottle, even if empty. 3. Medication refills: You are responsible for knowing and keeping track of what medications you take and those you need refilled. The day before your appointment: write a list of all prescriptions that need to be refilled. The day of the appointment: give the list to the admitting nurse. Prescriptions will be written only during appointments. If you forget a medication: it will not be "Called in", "Faxed", or "electronically sent". You will need to get another appointment to get these prescribed. No early refills. Do not call asking to have your prescription filled early. 4. Prescription Accuracy: You are responsible for  carefully inspecting your prescriptions before leaving our office. Have the discharge nurse carefully go over each prescription with you, before taking them home. Make sure that your name is accurately spelled, that your address is correct. Check the name and dose of your medication to make sure it is accurate. Check the number of pills, and the written instructions to make sure they are clear and accurate. Make sure that you are given enough medication to last until your next medication refill appointment. 5. Taking Medication: Take medication as prescribed. When it comes to controlled substances, taking less pills or less frequently than prescribed is permitted and encouraged. Never take more pills than instructed. Never take medication more frequently than prescribed.  6. Inform other Doctors: Always inform, all of your healthcare providers, of all the medications you take. 7. Pain Medication from other Providers: You are not allowed to accept any additional pain medication from any other Doctor or Healthcare provider. There are two exceptions to this rule. (see below) In the event that you require additional pain medication, you are responsible for notifying us, as stated below. 8. Medication Agreement: You are responsible for carefully reading and following our Medication Agreement. This must be signed before receiving any prescriptions from our practice. Safely store a copy of your signed Agreement. Violations to the Agreement will result in no further prescriptions. (Additional copies of our Medication Agreement are available upon request.) 9. Laws, Rules, & Regulations: All patients are expected to follow all Federal and State Laws, Statutes, Rules, & Regulations. Ignorance of the Laws does not constitute a valid excuse. The use of any illegal substances is prohibited. 10. Adopted CDC guidelines & recommendations: Target dosing levels will be at or below 60 MME/day. Use of benzodiazepines** is not  recommended.  Exceptions: There are only two exceptions to the rule of not receiving pain medications from other Healthcare Providers. 1.   Exception #1 (Emergencies): In the event of an emergency (i.e.: accident requiring emergency care), you are allowed to receive additional pain medication. However, you are responsible for: As soon as you are able, call our office [phone], at any time of the day or night, and leave a message stating your name, the date and nature of the emergency, and the name and dose of the medication prescribed. In the event that your call is answered by a member of our staff, make sure to document and save the date, time, and the name of the person that took your information.  2. Exception #2 (Planned Surgery): In the event that you are scheduled by another doctor or dentist to have any type of surgery or procedure, you are allowed (for a period no longer than 30 days), to receive additional pain medication, for the acute post-op pain. However, in this case, you are responsible for picking up a copy of our "Post-op Pain Management for Surgeons" handout, and giving it to your surgeon or dentist. This document is available at our office, and does not require an appointment to obtain it. Simply go to our office during business hours (Monday-Thursday from 8:00 AM to 4:00 PM) (Friday 8:00 AM to 12:00 Noon) or if you have a scheduled appointment with us, prior to your surgery, and ask for it by name. In addition, you will need to provide us with your name, name of your surgeon, type of surgery, and date of procedure or surgery.  *Opioid medications include: morphine, codeine, oxycodone, oxymorphone, hydrocodone, hydromorphone, meperidine, tramadol, tapentadol, buprenorphine, fentanyl, methadone. **Benzodiazepine medications include: diazepam (Valium), alprazolam (Xanax), clonazepam (Klonopine), lorazepam (Ativan), clorazepate (Tranxene), chlordiazepoxide (Librium), estazolam (Prosom),  oxazepam (Serax), temazepam (Restoril), triazolam (Halcion) (Last updated: 12/07/2017) ____________________________________________________________________________________________   BMI Assessment: Estimated body mass index is 24.37 kg/m as calculated from the following:   Height as of this encounter: 5\' 8"  (1.727 m).   Weight as of this encounter: 160 lb 4.8 oz (72.7 kg).  BMI interpretation table: BMI level Category Range association with higher incidence of chronic pain  <18 kg/m2 Underweight   18.5-24.9 kg/m2 Ideal body weight   25-29.9 kg/m2 Overweight Increased incidence by 20%  30-34.9 kg/m2 Obese (Class I) Increased incidence by 68%  35-39.9 kg/m2 Severe obesity (Class II) Increased incidence by 136%  >40 kg/m2 Extreme obesity (Class III) Increased incidence by 254%   Patient's current BMI Ideal Body weight  Body mass index is 24.37 kg/m. Ideal body weight: 68.4 kg (150 lb 12.7 oz) Adjusted ideal body weight: 70.1 kg (154 lb 9.6 oz)   BMI Readings from Last 4 Encounters:  09/17/18 24.37 kg/m  08/20/18 24.33 kg/m  06/21/18 24.33 kg/m  06/13/18 25.70 kg/m   Wt Readings from Last 4 Encounters:  09/17/18 160 lb 4.8 oz (72.7 kg)  08/20/18 160 lb (72.6 kg)  06/21/18 160 lb (72.6 kg)  06/13/18 169 lb (76.7 kg)

## 2018-09-22 LAB — TOXASSURE SELECT 13 (MW), URINE

## 2018-10-05 ENCOUNTER — Other Ambulatory Visit: Payer: Self-pay | Admitting: Nurse Practitioner

## 2018-10-05 DIAGNOSIS — M792 Neuralgia and neuritis, unspecified: Secondary | ICD-10-CM

## 2018-10-09 ENCOUNTER — Telehealth: Payer: Self-pay | Admitting: Nurse Practitioner

## 2018-10-09 NOTE — Telephone Encounter (Signed)
Crystal,            This patient was here in November for refills and was not given any further refills on Gabapentin.Can you please look into this and let me know if refill is appropriate at this time. Last fill was 09/06/2018. Thank you- Victorino DikeJennifer

## 2018-10-09 NOTE — Telephone Encounter (Signed)
[facility] phone [phone], fax [phone] lvmail at 8:30 stating patient needs refill on gabapentin. Please call.

## 2018-10-11 ENCOUNTER — Other Ambulatory Visit: Payer: Self-pay | Admitting: Nurse Practitioner

## 2018-10-11 DIAGNOSIS — M792 Neuralgia and neuritis, unspecified: Secondary | ICD-10-CM

## 2018-10-11 MED ORDER — GABAPENTIN 600 MG PO TABS: 600.0000 mg | ORAL_TABLET | Freq: Three times a day (TID) | ORAL | 0 refills | Status: AC

## 2018-10-11 NOTE — Telephone Encounter (Signed)
Sent in gabapentin

## 2018-10-18 ENCOUNTER — Encounter: Payer: Medicare Other | Admitting: Nurse Practitioner

## 2019-08-01 IMAGING — MR MR LUMBAR SPINE W/O CM
4 of 5 series · 15 of 48 positions shown · non-contrast
Comparison: None.

CLINICAL DATA: Chronic low back pain

EXAM:
MRI LUMBAR SPINE WITHOUT CONTRAST
TECHNIQUE: Multiplanar, multisequence MR imaging of the lumbar spine was
performed. No intravenous contrast was administered.

[Series 3: T2 · sagittal · 4.0mm · 0.44mm/px · 6 of 15 slices shown (1 of 2)]
[im 1/15]
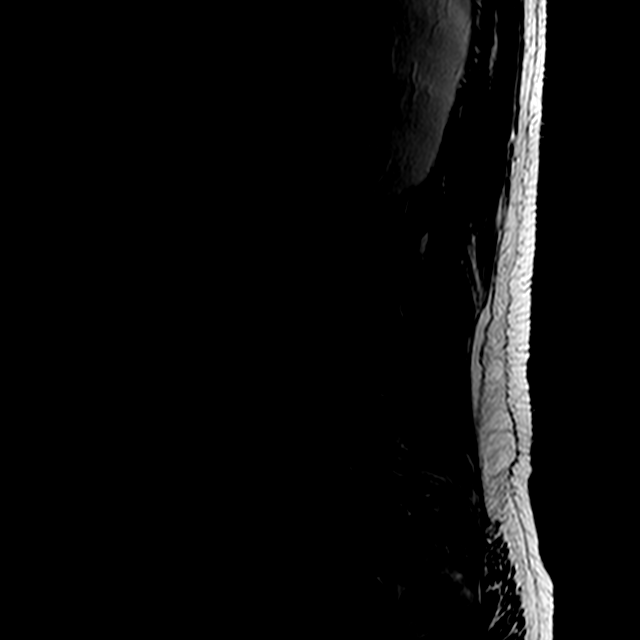
[im 3/15]
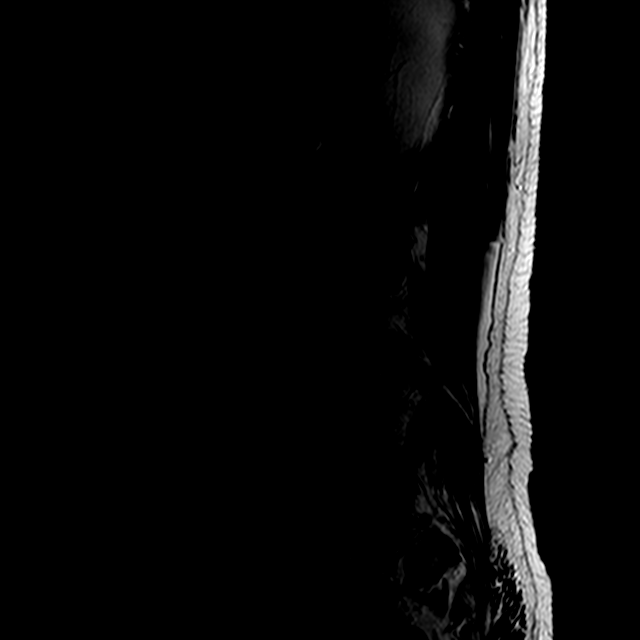
[im 6/15]
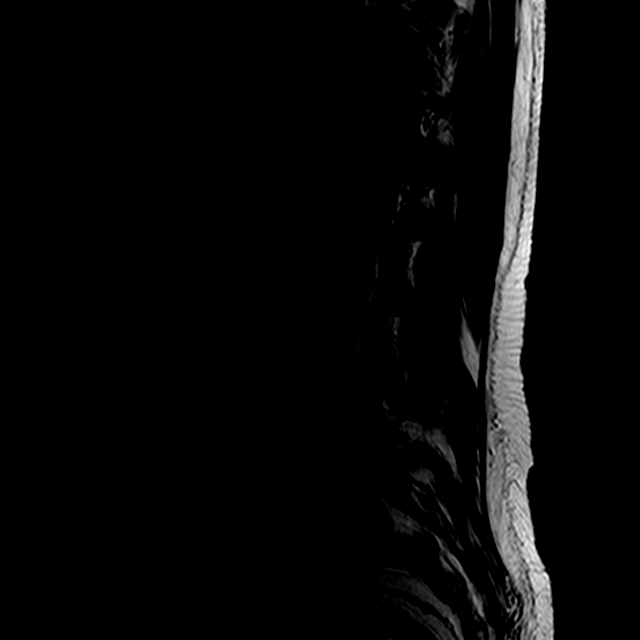
[im 9/15]
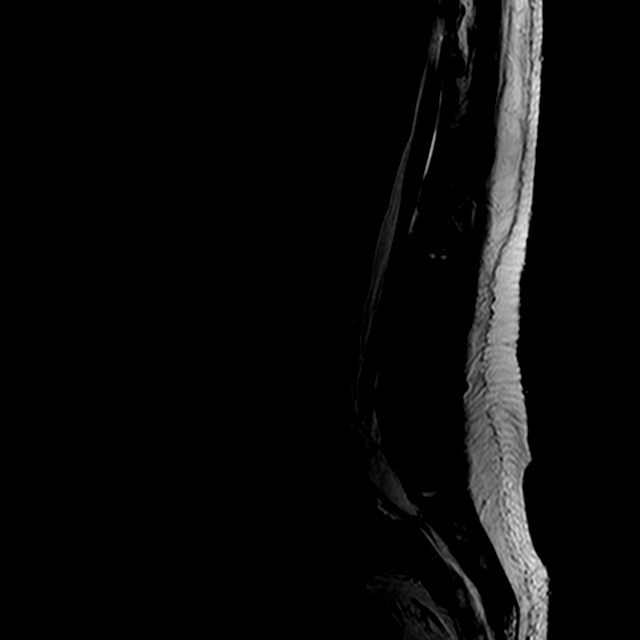
[im 12/15]
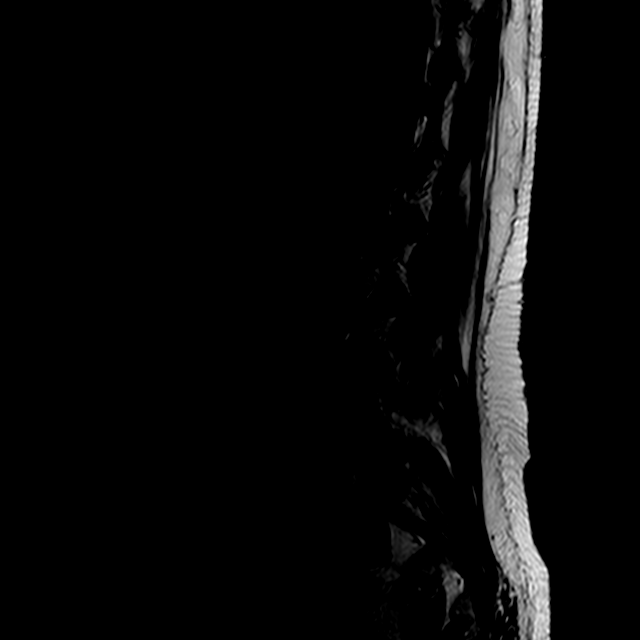
[im 15/15]
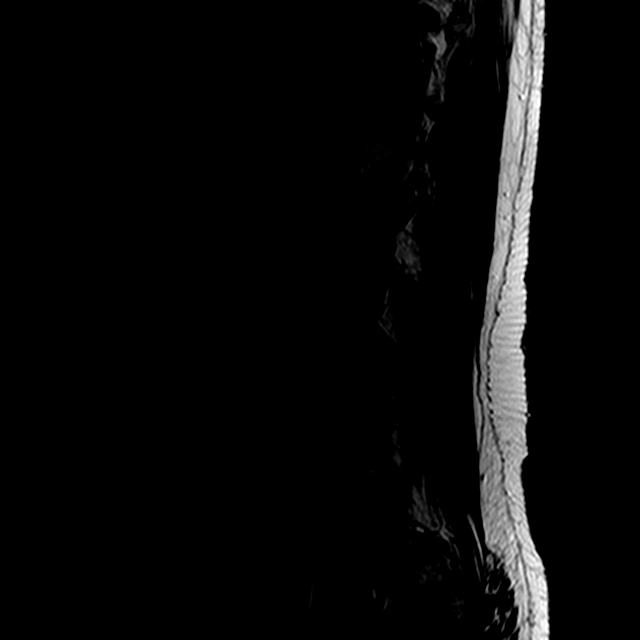

[Series 4: T1 · sagittal · 4.0mm · 0.44mm/px · 3 of 15 slices shown (1 of 2)]
[im 3/15]
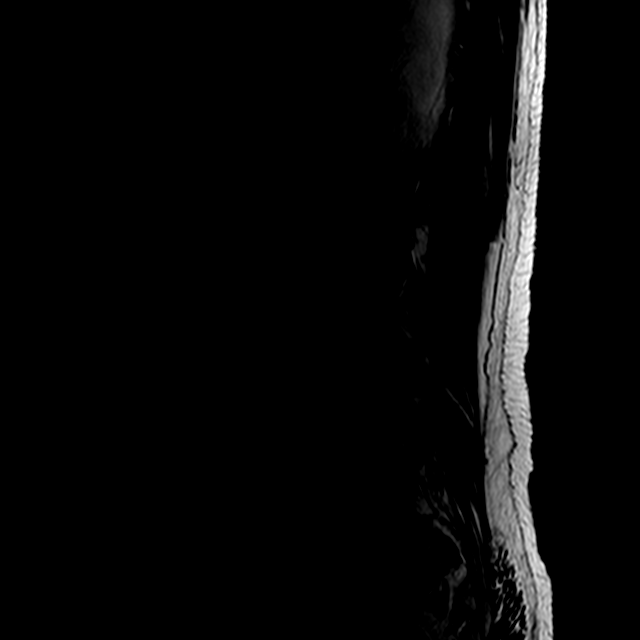
[im 9/15]
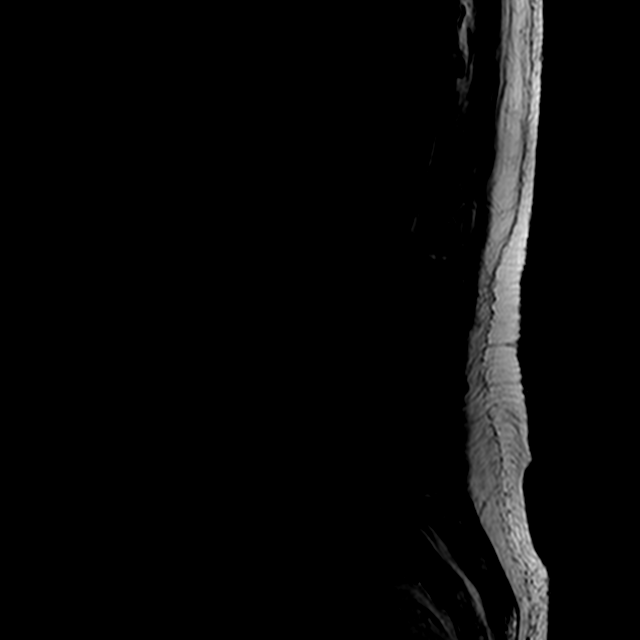
[im 15/15]
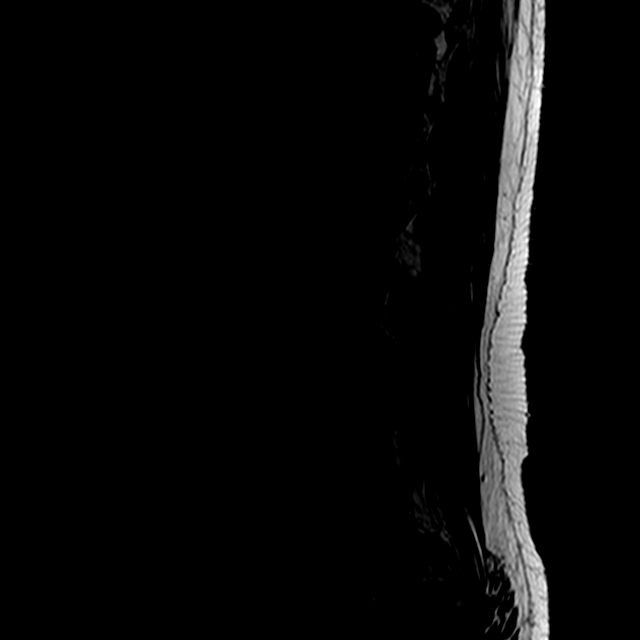

[Series 6: T2 · axial · 4.0mm · 0.39mm/px · z∈[-104,+92]mm · 3 of 35 slices shown (2 of 2)]
[im 5/35]
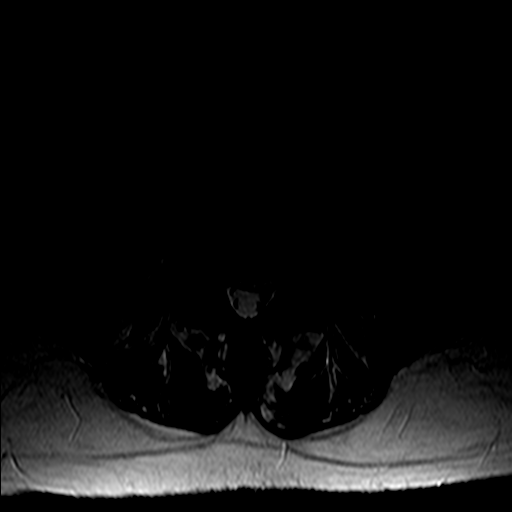
[im 18/35]
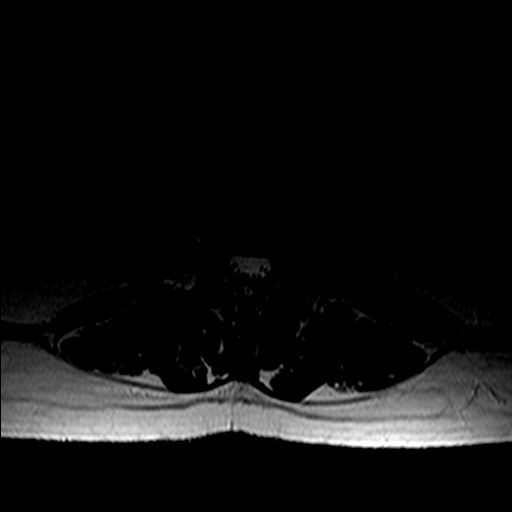
[im 30/35]
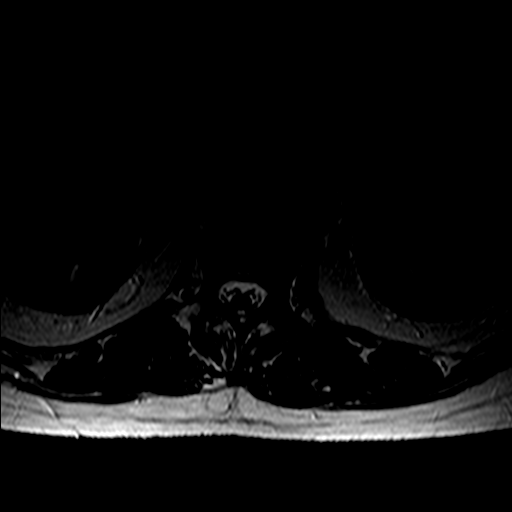

[Series 7: T1 · axial · 4.0mm · 0.39mm/px · z∈[-104,+92]mm · 3 of 35 slices shown (2 of 2)]
[im 5/35]
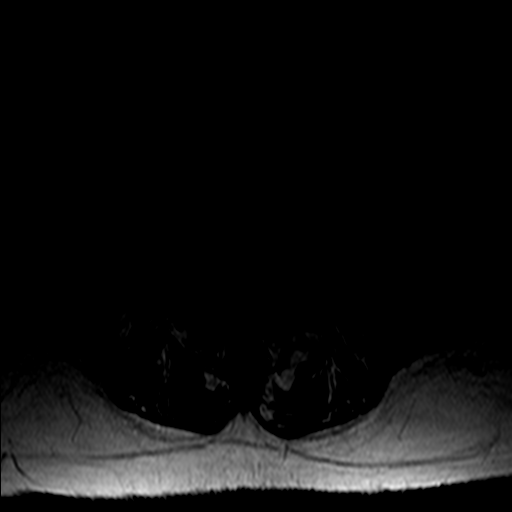
[im 18/35]
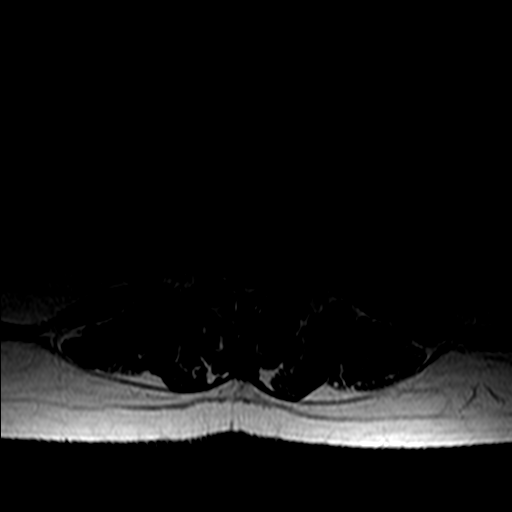
[im 30/35]
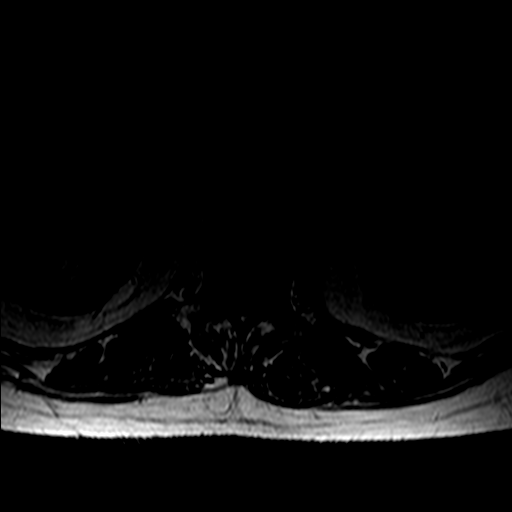

[15 of 48 positions shown; findings below may reference images not displayed]

FINDINGS: Segmentation:  Standard.

Alignment:  Physiologic.

Vertebrae:  No fracture, evidence of discitis, or bone lesion.

Conus medullaris: Extends to the L1 level and appears normal.

Paraspinal and other soft tissues: No paraspinal abnormality.

Disc levels:

Disc spaces: Disc desiccation at L3-4, L4-5 and L5-S1. Disc height
loss at L5-S1.

T12-L1: No significant disc bulge. No evidence of neural foraminal
stenosis. No central canal stenosis.

L1-L2: No significant disc bulge. No evidence of neural foraminal
stenosis. No central canal stenosis.

L2-L3: No significant disc bulge. No evidence of neural foraminal
stenosis. No central canal stenosis.

L3-L4: Broad-based disc bulge. No evidence of neural foraminal
stenosis. No central canal stenosis.

L4-L5: Mild broad-based disc bulge. Mild bilateral facet
arthropathy. No evidence of neural foraminal stenosis. No central
canal stenosis.

L5-S1: Mild broad-based disc bulge so tiny central disc protrusions.
Mild bilateral facet arthropathy. No evidence of neural foraminal
stenosis. No central canal stenosis.
IMPRESSION: 1. At L5-S1 there is a mild broad-based disc bulge so tiny central
disc protrusions. Mild bilateral facet arthropathy.
2. At L4-5 there is a mild broad-based disc bulge. Mild bilateral
facet arthropathy.
